# Patient Record
Sex: Female | Born: 1935
Health system: Southern US, Community
[De-identification: ages and names within clinical notes are randomized; demographics above are authoritative.]

## PROBLEM LIST (undated history)

## (undated) DIAGNOSIS — D45 Polycythemia vera: Secondary | ICD-10-CM

## (undated) DIAGNOSIS — Q51818 Other congenital malformations of uterus: Secondary | ICD-10-CM

## (undated) DIAGNOSIS — J111 Influenza due to unidentified influenza virus with other respiratory manifestations: Secondary | ICD-10-CM

## (undated) DIAGNOSIS — D751 Secondary polycythemia: Secondary | ICD-10-CM

## (undated) DIAGNOSIS — I10 Essential (primary) hypertension: Secondary | ICD-10-CM

## (undated) HISTORY — DX: Essential (primary) hypertension: I10

## (undated) HISTORY — PX: REVISION TOTAL HIP ARTHROPLASTY: SHX766

## (undated) HISTORY — DX: Other congenital malformations of uterus: Q51.818

## (undated) HISTORY — PX: APPENDECTOMY: SHX54

## (undated) HISTORY — DX: Polycythemia vera: D45

## (undated) HISTORY — DX: Influenza due to unidentified influenza virus with other respiratory manifestations: J11.1

## (undated) HISTORY — PX: OOPHORECTOMY: SHX86

## (undated) HISTORY — PX: COLONOSCOPY: SHX174

## (undated) HISTORY — DX: Secondary polycythemia: D75.1

## (undated) HISTORY — PX: OTHER SURGICAL HISTORY: SHX169

---

## 2002-04-06 ENCOUNTER — Ambulatory Visit (HOSPITAL_COMMUNITY): Admission: RE | Admit: 2002-04-06 | Discharge: 2002-04-06 | Payer: Self-pay | Admitting: Internal Medicine

## 2002-04-06 ENCOUNTER — Encounter: Payer: Self-pay | Admitting: Internal Medicine

## 2003-04-29 ENCOUNTER — Encounter: Payer: Self-pay | Admitting: Family Medicine

## 2003-04-29 ENCOUNTER — Ambulatory Visit (HOSPITAL_COMMUNITY): Admission: RE | Admit: 2003-04-29 | Discharge: 2003-04-29 | Payer: Self-pay | Admitting: Family Medicine

## 2003-10-26 ENCOUNTER — Emergency Department (HOSPITAL_COMMUNITY): Admission: EM | Admit: 2003-10-26 | Discharge: 2003-10-26 | Payer: Self-pay | Admitting: Internal Medicine

## 2004-01-20 ENCOUNTER — Ambulatory Visit (HOSPITAL_COMMUNITY): Admission: RE | Admit: 2004-01-20 | Discharge: 2004-01-20 | Payer: Self-pay | Admitting: Family Medicine

## 2004-05-04 ENCOUNTER — Ambulatory Visit (HOSPITAL_COMMUNITY): Admission: RE | Admit: 2004-05-04 | Discharge: 2004-05-04 | Payer: Self-pay | Admitting: Family Medicine

## 2004-05-15 ENCOUNTER — Ambulatory Visit (HOSPITAL_COMMUNITY): Admission: RE | Admit: 2004-05-15 | Discharge: 2004-05-15 | Payer: Self-pay | Admitting: Family Medicine

## 2004-07-17 ENCOUNTER — Encounter: Admission: RE | Admit: 2004-07-17 | Discharge: 2004-07-17 | Payer: Self-pay | Admitting: Oncology

## 2004-07-18 ENCOUNTER — Encounter (HOSPITAL_COMMUNITY): Admission: RE | Admit: 2004-07-18 | Discharge: 2004-08-17 | Payer: Self-pay | Admitting: Oncology

## 2004-08-27 ENCOUNTER — Encounter (HOSPITAL_COMMUNITY): Admission: RE | Admit: 2004-08-27 | Discharge: 2004-09-14 | Payer: Self-pay | Admitting: Oncology

## 2004-08-27 ENCOUNTER — Encounter: Admission: RE | Admit: 2004-08-27 | Discharge: 2004-09-14 | Payer: Self-pay | Admitting: Oncology

## 2004-09-20 ENCOUNTER — Encounter: Admission: RE | Admit: 2004-09-20 | Discharge: 2004-09-20 | Payer: Self-pay | Admitting: Oncology

## 2004-09-20 ENCOUNTER — Encounter (HOSPITAL_COMMUNITY): Admission: RE | Admit: 2004-09-20 | Discharge: 2004-10-20 | Payer: Self-pay | Admitting: Oncology

## 2004-11-07 ENCOUNTER — Ambulatory Visit (HOSPITAL_COMMUNITY): Payer: Self-pay | Admitting: Oncology

## 2004-11-07 ENCOUNTER — Encounter: Admission: RE | Admit: 2004-11-07 | Discharge: 2004-11-07 | Payer: Self-pay | Admitting: Oncology

## 2004-11-07 ENCOUNTER — Encounter (HOSPITAL_COMMUNITY): Admission: RE | Admit: 2004-11-07 | Discharge: 2004-12-07 | Payer: Self-pay | Admitting: Internal Medicine

## 2004-12-12 ENCOUNTER — Encounter (HOSPITAL_COMMUNITY): Admission: RE | Admit: 2004-12-12 | Discharge: 2005-01-11 | Payer: Self-pay | Admitting: Oncology

## 2004-12-12 ENCOUNTER — Encounter: Admission: RE | Admit: 2004-12-12 | Discharge: 2004-12-12 | Payer: Self-pay | Admitting: Oncology

## 2005-01-11 ENCOUNTER — Ambulatory Visit (HOSPITAL_COMMUNITY): Payer: Self-pay | Admitting: Oncology

## 2005-02-01 ENCOUNTER — Emergency Department (HOSPITAL_COMMUNITY): Admission: EM | Admit: 2005-02-01 | Discharge: 2005-02-02 | Payer: Self-pay | Admitting: *Deleted

## 2005-02-08 ENCOUNTER — Encounter (HOSPITAL_COMMUNITY): Admission: RE | Admit: 2005-02-08 | Discharge: 2005-03-10 | Payer: Self-pay | Admitting: Oncology

## 2005-02-08 ENCOUNTER — Encounter: Admission: RE | Admit: 2005-02-08 | Discharge: 2005-02-08 | Payer: Self-pay | Admitting: Oncology

## 2005-03-15 ENCOUNTER — Encounter: Admission: RE | Admit: 2005-03-15 | Discharge: 2005-03-15 | Payer: Self-pay | Admitting: Oncology

## 2005-03-15 ENCOUNTER — Ambulatory Visit (HOSPITAL_COMMUNITY): Payer: Self-pay | Admitting: Oncology

## 2005-03-15 ENCOUNTER — Encounter (HOSPITAL_COMMUNITY): Admission: RE | Admit: 2005-03-15 | Discharge: 2005-04-14 | Payer: Self-pay | Admitting: Oncology

## 2005-04-30 ENCOUNTER — Ambulatory Visit (HOSPITAL_COMMUNITY): Payer: Self-pay | Admitting: Oncology

## 2005-04-30 ENCOUNTER — Encounter (HOSPITAL_COMMUNITY): Admission: RE | Admit: 2005-04-30 | Discharge: 2005-05-30 | Payer: Self-pay | Admitting: Oncology

## 2005-04-30 ENCOUNTER — Encounter: Admission: RE | Admit: 2005-04-30 | Discharge: 2005-04-30 | Payer: Self-pay | Admitting: Oncology

## 2005-05-14 ENCOUNTER — Ambulatory Visit (HOSPITAL_COMMUNITY): Admission: RE | Admit: 2005-05-14 | Discharge: 2005-05-14 | Payer: Self-pay | Admitting: Family Medicine

## 2005-06-19 ENCOUNTER — Ambulatory Visit (HOSPITAL_COMMUNITY): Payer: Self-pay | Admitting: Oncology

## 2005-06-19 ENCOUNTER — Encounter: Admission: RE | Admit: 2005-06-19 | Discharge: 2005-06-19 | Payer: Self-pay | Admitting: Oncology

## 2005-06-19 ENCOUNTER — Encounter (HOSPITAL_COMMUNITY): Admission: RE | Admit: 2005-06-19 | Discharge: 2005-07-19 | Payer: Self-pay | Admitting: Oncology

## 2005-07-31 ENCOUNTER — Encounter (HOSPITAL_COMMUNITY): Admission: RE | Admit: 2005-07-31 | Discharge: 2005-08-30 | Payer: Self-pay | Admitting: Oncology

## 2005-07-31 ENCOUNTER — Encounter: Admission: RE | Admit: 2005-07-31 | Discharge: 2005-07-31 | Payer: Self-pay | Admitting: Oncology

## 2005-08-21 ENCOUNTER — Ambulatory Visit (HOSPITAL_COMMUNITY): Payer: Self-pay | Admitting: Oncology

## 2005-09-11 ENCOUNTER — Encounter: Admission: RE | Admit: 2005-09-11 | Discharge: 2005-09-14 | Payer: Self-pay | Admitting: Oncology

## 2005-09-11 ENCOUNTER — Encounter (HOSPITAL_COMMUNITY): Admission: RE | Admit: 2005-09-11 | Discharge: 2005-09-14 | Payer: Self-pay | Admitting: Oncology

## 2005-10-02 ENCOUNTER — Encounter: Admission: RE | Admit: 2005-10-02 | Discharge: 2005-10-02 | Payer: Self-pay | Admitting: Oncology

## 2005-10-02 ENCOUNTER — Encounter (HOSPITAL_COMMUNITY): Admission: RE | Admit: 2005-10-02 | Discharge: 2005-11-01 | Payer: Self-pay | Admitting: Oncology

## 2005-10-23 ENCOUNTER — Ambulatory Visit (HOSPITAL_COMMUNITY): Payer: Self-pay | Admitting: Oncology

## 2005-11-26 ENCOUNTER — Encounter: Admission: RE | Admit: 2005-11-26 | Discharge: 2005-11-26 | Payer: Self-pay | Admitting: Oncology

## 2005-11-26 ENCOUNTER — Encounter (HOSPITAL_COMMUNITY): Admission: RE | Admit: 2005-11-26 | Discharge: 2005-11-26 | Payer: Self-pay | Admitting: Oncology

## 2006-01-07 ENCOUNTER — Ambulatory Visit (HOSPITAL_COMMUNITY): Payer: Self-pay | Admitting: Oncology

## 2006-01-07 ENCOUNTER — Encounter (HOSPITAL_COMMUNITY): Admission: RE | Admit: 2006-01-07 | Discharge: 2006-02-06 | Payer: Self-pay | Admitting: Oncology

## 2006-01-07 ENCOUNTER — Encounter: Admission: RE | Admit: 2006-01-07 | Discharge: 2006-01-07 | Payer: Self-pay | Admitting: Oncology

## 2006-02-18 ENCOUNTER — Encounter: Admission: RE | Admit: 2006-02-18 | Discharge: 2006-02-18 | Payer: Self-pay | Admitting: Oncology

## 2006-02-18 ENCOUNTER — Encounter (HOSPITAL_COMMUNITY): Admission: RE | Admit: 2006-02-18 | Discharge: 2006-03-20 | Payer: Self-pay | Admitting: Oncology

## 2006-03-18 ENCOUNTER — Ambulatory Visit (HOSPITAL_COMMUNITY): Payer: Self-pay | Admitting: Oncology

## 2006-04-15 ENCOUNTER — Encounter: Admission: RE | Admit: 2006-04-15 | Discharge: 2006-04-15 | Payer: Self-pay | Admitting: Oncology

## 2006-04-15 ENCOUNTER — Encounter (HOSPITAL_COMMUNITY): Admission: RE | Admit: 2006-04-15 | Discharge: 2006-05-15 | Payer: Self-pay | Admitting: Oncology

## 2006-05-14 ENCOUNTER — Ambulatory Visit (HOSPITAL_COMMUNITY): Payer: Self-pay | Admitting: Oncology

## 2006-05-19 ENCOUNTER — Ambulatory Visit (HOSPITAL_COMMUNITY): Admission: RE | Admit: 2006-05-19 | Discharge: 2006-05-19 | Payer: Self-pay | Admitting: Family Medicine

## 2006-06-10 ENCOUNTER — Encounter: Admission: RE | Admit: 2006-06-10 | Discharge: 2006-06-10 | Payer: Self-pay | Admitting: Oncology

## 2006-06-10 ENCOUNTER — Encounter (HOSPITAL_COMMUNITY): Admission: RE | Admit: 2006-06-10 | Discharge: 2006-07-10 | Payer: Self-pay | Admitting: Oncology

## 2006-07-14 ENCOUNTER — Encounter (HOSPITAL_COMMUNITY): Admission: RE | Admit: 2006-07-14 | Discharge: 2006-08-13 | Payer: Self-pay | Admitting: Oncology

## 2006-07-14 ENCOUNTER — Encounter: Admission: RE | Admit: 2006-07-14 | Discharge: 2006-07-14 | Payer: Self-pay | Admitting: Oncology

## 2006-07-14 ENCOUNTER — Ambulatory Visit (HOSPITAL_COMMUNITY): Payer: Self-pay | Admitting: Oncology

## 2006-09-08 ENCOUNTER — Ambulatory Visit (HOSPITAL_COMMUNITY): Payer: Self-pay | Admitting: Oncology

## 2006-09-08 ENCOUNTER — Encounter (HOSPITAL_COMMUNITY): Admission: RE | Admit: 2006-09-08 | Discharge: 2006-09-12 | Payer: Self-pay | Admitting: Oncology

## 2006-09-08 ENCOUNTER — Encounter: Admission: RE | Admit: 2006-09-08 | Discharge: 2006-09-12 | Payer: Self-pay | Admitting: Oncology

## 2006-10-06 ENCOUNTER — Encounter: Admission: RE | Admit: 2006-10-06 | Discharge: 2006-10-06 | Payer: Self-pay | Admitting: Oncology

## 2006-10-06 ENCOUNTER — Encounter (HOSPITAL_COMMUNITY): Admission: RE | Admit: 2006-10-06 | Discharge: 2006-11-05 | Payer: Self-pay | Admitting: Oncology

## 2006-10-24 ENCOUNTER — Ambulatory Visit (HOSPITAL_COMMUNITY): Admission: RE | Admit: 2006-10-24 | Discharge: 2006-10-24 | Payer: Self-pay | Admitting: Urology

## 2006-11-03 ENCOUNTER — Ambulatory Visit (HOSPITAL_COMMUNITY): Payer: Self-pay | Admitting: Oncology

## 2006-11-25 ENCOUNTER — Encounter (HOSPITAL_COMMUNITY): Admission: RE | Admit: 2006-11-25 | Discharge: 2006-12-15 | Payer: Self-pay | Admitting: Oncology

## 2006-12-22 ENCOUNTER — Ambulatory Visit (HOSPITAL_COMMUNITY): Payer: Self-pay | Admitting: Oncology

## 2006-12-22 ENCOUNTER — Encounter (HOSPITAL_COMMUNITY): Admission: RE | Admit: 2006-12-22 | Discharge: 2007-01-21 | Payer: Self-pay | Admitting: Oncology

## 2007-02-17 ENCOUNTER — Ambulatory Visit (HOSPITAL_COMMUNITY): Payer: Self-pay | Admitting: Oncology

## 2007-02-17 ENCOUNTER — Encounter (HOSPITAL_COMMUNITY): Admission: RE | Admit: 2007-02-17 | Discharge: 2007-03-19 | Payer: Self-pay | Admitting: Oncology

## 2007-03-23 ENCOUNTER — Encounter (HOSPITAL_COMMUNITY): Admission: RE | Admit: 2007-03-23 | Discharge: 2007-04-22 | Payer: Self-pay | Admitting: Oncology

## 2007-04-20 ENCOUNTER — Ambulatory Visit (HOSPITAL_COMMUNITY): Payer: Self-pay | Admitting: Oncology

## 2007-05-22 ENCOUNTER — Ambulatory Visit (HOSPITAL_COMMUNITY): Admission: RE | Admit: 2007-05-22 | Discharge: 2007-05-22 | Payer: Self-pay | Admitting: Family Medicine

## 2007-05-26 ENCOUNTER — Encounter (HOSPITAL_COMMUNITY): Admission: RE | Admit: 2007-05-26 | Discharge: 2007-06-25 | Payer: Self-pay | Admitting: Oncology

## 2007-05-30 ENCOUNTER — Emergency Department (HOSPITAL_COMMUNITY): Admission: EM | Admit: 2007-05-30 | Discharge: 2007-05-30 | Payer: Self-pay | Admitting: Emergency Medicine

## 2007-06-23 ENCOUNTER — Ambulatory Visit (HOSPITAL_COMMUNITY): Payer: Self-pay | Admitting: Oncology

## 2007-07-20 ENCOUNTER — Encounter (HOSPITAL_COMMUNITY): Admission: RE | Admit: 2007-07-20 | Discharge: 2007-08-19 | Payer: Self-pay | Admitting: Oncology

## 2007-08-18 ENCOUNTER — Ambulatory Visit (HOSPITAL_COMMUNITY): Payer: Self-pay | Admitting: Oncology

## 2007-09-04 ENCOUNTER — Ambulatory Visit (HOSPITAL_COMMUNITY): Admission: RE | Admit: 2007-09-04 | Discharge: 2007-09-04 | Payer: Self-pay | Admitting: Family Medicine

## 2007-09-15 ENCOUNTER — Encounter (HOSPITAL_COMMUNITY): Admission: RE | Admit: 2007-09-15 | Discharge: 2007-09-15 | Payer: Self-pay | Admitting: Oncology

## 2007-10-13 ENCOUNTER — Encounter (HOSPITAL_COMMUNITY): Admission: RE | Admit: 2007-10-13 | Discharge: 2007-11-12 | Payer: Self-pay | Admitting: Oncology

## 2007-10-13 ENCOUNTER — Ambulatory Visit (HOSPITAL_COMMUNITY): Payer: Self-pay | Admitting: Oncology

## 2007-11-24 ENCOUNTER — Encounter (HOSPITAL_COMMUNITY): Admission: RE | Admit: 2007-11-24 | Discharge: 2007-12-16 | Payer: Self-pay | Admitting: Oncology

## 2007-12-15 ENCOUNTER — Ambulatory Visit (HOSPITAL_COMMUNITY): Payer: Self-pay | Admitting: Oncology

## 2008-01-12 ENCOUNTER — Encounter (HOSPITAL_COMMUNITY): Admission: RE | Admit: 2008-01-12 | Discharge: 2008-02-11 | Payer: Self-pay | Admitting: Oncology

## 2008-02-09 ENCOUNTER — Ambulatory Visit (HOSPITAL_COMMUNITY): Payer: Self-pay | Admitting: Oncology

## 2008-03-08 ENCOUNTER — Encounter (HOSPITAL_COMMUNITY): Admission: RE | Admit: 2008-03-08 | Discharge: 2008-04-07 | Payer: Self-pay | Admitting: Oncology

## 2008-04-01 ENCOUNTER — Encounter: Payer: Self-pay | Admitting: Gastroenterology

## 2008-04-01 ENCOUNTER — Ambulatory Visit (HOSPITAL_COMMUNITY): Admission: RE | Admit: 2008-04-01 | Discharge: 2008-04-01 | Payer: Self-pay | Admitting: Gastroenterology

## 2008-04-06 ENCOUNTER — Ambulatory Visit: Payer: Self-pay | Admitting: Gastroenterology

## 2008-04-12 ENCOUNTER — Encounter (HOSPITAL_COMMUNITY): Admission: RE | Admit: 2008-04-12 | Discharge: 2008-05-12 | Payer: Self-pay | Admitting: Oncology

## 2008-04-12 ENCOUNTER — Ambulatory Visit (HOSPITAL_COMMUNITY): Payer: Self-pay | Admitting: Oncology

## 2008-06-03 ENCOUNTER — Ambulatory Visit (HOSPITAL_COMMUNITY): Admission: RE | Admit: 2008-06-03 | Discharge: 2008-06-03 | Payer: Self-pay | Admitting: Family Medicine

## 2008-06-10 ENCOUNTER — Ambulatory Visit (HOSPITAL_COMMUNITY): Payer: Self-pay | Admitting: Oncology

## 2008-06-10 ENCOUNTER — Encounter (HOSPITAL_COMMUNITY): Admission: RE | Admit: 2008-06-10 | Discharge: 2008-07-10 | Payer: Self-pay | Admitting: Oncology

## 2008-07-19 ENCOUNTER — Encounter (HOSPITAL_COMMUNITY): Admission: RE | Admit: 2008-07-19 | Discharge: 2008-08-18 | Payer: Self-pay | Admitting: Oncology

## 2008-08-30 ENCOUNTER — Encounter (HOSPITAL_COMMUNITY): Admission: RE | Admit: 2008-08-30 | Discharge: 2008-09-29 | Payer: Self-pay | Admitting: Oncology

## 2008-08-30 ENCOUNTER — Ambulatory Visit (HOSPITAL_COMMUNITY): Payer: Self-pay | Admitting: Oncology

## 2008-10-11 ENCOUNTER — Encounter (HOSPITAL_COMMUNITY): Admission: RE | Admit: 2008-10-11 | Discharge: 2008-11-10 | Payer: Self-pay | Admitting: Oncology

## 2008-11-22 ENCOUNTER — Ambulatory Visit (HOSPITAL_COMMUNITY): Payer: Self-pay | Admitting: Oncology

## 2008-11-22 ENCOUNTER — Encounter (HOSPITAL_COMMUNITY): Admission: RE | Admit: 2008-11-22 | Discharge: 2008-12-14 | Payer: Self-pay | Admitting: Oncology

## 2009-01-17 ENCOUNTER — Ambulatory Visit (HOSPITAL_COMMUNITY): Payer: Self-pay | Admitting: Oncology

## 2009-01-17 ENCOUNTER — Encounter (HOSPITAL_COMMUNITY): Admission: RE | Admit: 2009-01-17 | Discharge: 2009-02-16 | Payer: Self-pay | Admitting: Oncology

## 2009-03-06 ENCOUNTER — Emergency Department (HOSPITAL_COMMUNITY): Admission: EM | Admit: 2009-03-06 | Discharge: 2009-03-06 | Payer: Self-pay | Admitting: Emergency Medicine

## 2009-03-14 ENCOUNTER — Encounter (HOSPITAL_COMMUNITY): Admission: RE | Admit: 2009-03-14 | Discharge: 2009-04-14 | Payer: Self-pay | Admitting: Oncology

## 2009-03-14 ENCOUNTER — Ambulatory Visit (HOSPITAL_COMMUNITY): Payer: Self-pay | Admitting: Oncology

## 2009-05-09 ENCOUNTER — Encounter (HOSPITAL_COMMUNITY): Admission: RE | Admit: 2009-05-09 | Discharge: 2009-06-08 | Payer: Self-pay | Admitting: Oncology

## 2009-05-09 ENCOUNTER — Ambulatory Visit (HOSPITAL_COMMUNITY): Payer: Self-pay | Admitting: Oncology

## 2009-06-23 ENCOUNTER — Ambulatory Visit (HOSPITAL_COMMUNITY): Admission: RE | Admit: 2009-06-23 | Discharge: 2009-06-23 | Payer: Self-pay | Admitting: Family Medicine

## 2009-06-30 ENCOUNTER — Ambulatory Visit (HOSPITAL_COMMUNITY): Payer: Self-pay | Admitting: Oncology

## 2009-06-30 ENCOUNTER — Encounter (HOSPITAL_COMMUNITY): Admission: RE | Admit: 2009-06-30 | Discharge: 2009-07-30 | Payer: Self-pay | Admitting: Oncology

## 2009-08-18 ENCOUNTER — Ambulatory Visit (HOSPITAL_COMMUNITY): Payer: Self-pay | Admitting: Oncology

## 2009-08-18 ENCOUNTER — Encounter (HOSPITAL_COMMUNITY): Admission: RE | Admit: 2009-08-18 | Discharge: 2009-09-13 | Payer: Self-pay | Admitting: Oncology

## 2009-10-10 ENCOUNTER — Encounter (HOSPITAL_COMMUNITY): Admission: RE | Admit: 2009-10-10 | Discharge: 2009-11-09 | Payer: Self-pay | Admitting: Oncology

## 2009-10-10 ENCOUNTER — Ambulatory Visit (HOSPITAL_COMMUNITY): Payer: Self-pay | Admitting: Oncology

## 2009-12-05 ENCOUNTER — Encounter (HOSPITAL_COMMUNITY): Admission: RE | Admit: 2009-12-05 | Discharge: 2009-12-13 | Payer: Self-pay | Admitting: Oncology

## 2009-12-05 ENCOUNTER — Ambulatory Visit (HOSPITAL_COMMUNITY): Payer: Self-pay | Admitting: Oncology

## 2010-01-30 ENCOUNTER — Encounter (HOSPITAL_COMMUNITY): Admission: RE | Admit: 2010-01-30 | Discharge: 2010-03-01 | Payer: Self-pay | Admitting: Oncology

## 2010-01-30 ENCOUNTER — Ambulatory Visit (HOSPITAL_COMMUNITY): Payer: Self-pay | Admitting: Oncology

## 2010-03-02 ENCOUNTER — Other Ambulatory Visit: Admission: RE | Admit: 2010-03-02 | Discharge: 2010-03-02 | Payer: Self-pay | Admitting: Obstetrics & Gynecology

## 2010-03-27 ENCOUNTER — Ambulatory Visit (HOSPITAL_COMMUNITY): Payer: Self-pay | Admitting: Oncology

## 2010-03-27 ENCOUNTER — Encounter (HOSPITAL_COMMUNITY): Admission: RE | Admit: 2010-03-27 | Discharge: 2010-04-26 | Payer: Self-pay | Admitting: Oncology

## 2010-04-13 ENCOUNTER — Ambulatory Visit (HOSPITAL_COMMUNITY): Admission: RE | Admit: 2010-04-13 | Discharge: 2010-04-13 | Payer: Self-pay | Admitting: Family Medicine

## 2010-05-29 ENCOUNTER — Encounter (HOSPITAL_COMMUNITY): Admission: RE | Admit: 2010-05-29 | Discharge: 2010-06-28 | Payer: Self-pay | Admitting: Oncology

## 2010-05-29 ENCOUNTER — Ambulatory Visit (HOSPITAL_COMMUNITY): Payer: Self-pay | Admitting: Oncology

## 2010-07-05 ENCOUNTER — Ambulatory Visit (HOSPITAL_COMMUNITY): Admission: RE | Admit: 2010-07-05 | Discharge: 2010-07-05 | Payer: Self-pay | Admitting: Family Medicine

## 2010-07-25 ENCOUNTER — Ambulatory Visit (HOSPITAL_COMMUNITY): Admission: RE | Admit: 2010-07-25 | Discharge: 2010-07-25 | Payer: Self-pay | Admitting: Family Medicine

## 2010-08-01 ENCOUNTER — Ambulatory Visit (HOSPITAL_COMMUNITY): Payer: Self-pay | Admitting: Oncology

## 2010-08-01 ENCOUNTER — Encounter (HOSPITAL_COMMUNITY): Admission: RE | Admit: 2010-08-01 | Discharge: 2010-08-31 | Payer: Self-pay | Admitting: Oncology

## 2010-09-25 ENCOUNTER — Ambulatory Visit (HOSPITAL_COMMUNITY): Payer: Self-pay | Admitting: Oncology

## 2010-09-25 ENCOUNTER — Encounter (HOSPITAL_COMMUNITY)
Admission: RE | Admit: 2010-09-25 | Discharge: 2010-10-25 | Payer: Self-pay | Source: Home / Self Care | Admitting: Oncology

## 2010-11-20 ENCOUNTER — Encounter (HOSPITAL_COMMUNITY)
Admission: RE | Admit: 2010-11-20 | Discharge: 2010-12-20 | Payer: Self-pay | Source: Home / Self Care | Attending: Oncology | Admitting: Oncology

## 2010-11-20 ENCOUNTER — Ambulatory Visit (HOSPITAL_COMMUNITY): Payer: Self-pay | Admitting: Oncology

## 2011-01-04 ENCOUNTER — Encounter (HOSPITAL_COMMUNITY)
Admission: RE | Admit: 2011-01-04 | Discharge: 2011-01-15 | Payer: Self-pay | Source: Home / Self Care | Attending: Oncology | Admitting: Oncology

## 2011-01-04 ENCOUNTER — Ambulatory Visit (HOSPITAL_COMMUNITY)
Admission: RE | Admit: 2011-01-04 | Discharge: 2011-01-15 | Payer: Self-pay | Source: Home / Self Care | Attending: Oncology | Admitting: Oncology

## 2011-01-06 ENCOUNTER — Encounter: Payer: Self-pay | Admitting: Family Medicine

## 2011-01-07 LAB — CBC
MCH: 31 pg (ref 26.0–34.0)
MCV: 92.5 fL (ref 78.0–100.0)
Platelets: 436 10*3/uL — ABNORMAL HIGH (ref 150–400)
RDW: 15.2 % (ref 11.5–15.5)
WBC: 7.6 10*3/uL (ref 4.0–10.5)

## 2011-01-07 LAB — DIFFERENTIAL
Eosinophils Absolute: 0.3 10*3/uL (ref 0.0–0.7)
Eosinophils Relative: 4 % (ref 0–5)
Lymphs Abs: 1.3 10*3/uL (ref 0.7–4.0)

## 2011-01-28 ENCOUNTER — Ambulatory Visit (HOSPITAL_COMMUNITY)
Admission: RE | Admit: 2011-01-28 | Discharge: 2011-01-28 | Disposition: A | Discharge status: Home / Self Care | Payer: Medicare Other | Source: Ambulatory Visit | Attending: Family Medicine | Admitting: Family Medicine

## 2011-01-28 ENCOUNTER — Other Ambulatory Visit (HOSPITAL_COMMUNITY): Payer: Self-pay | Admitting: Family Medicine

## 2011-01-28 DIAGNOSIS — M545 Low back pain, unspecified: Secondary | ICD-10-CM | POA: Insufficient documentation

## 2011-01-28 DIAGNOSIS — M51379 Other intervertebral disc degeneration, lumbosacral region without mention of lumbar back pain or lower extremity pain: Secondary | ICD-10-CM | POA: Insufficient documentation

## 2011-01-28 DIAGNOSIS — M5137 Other intervertebral disc degeneration, lumbosacral region: Secondary | ICD-10-CM | POA: Insufficient documentation

## 2011-02-25 LAB — DIFFERENTIAL
Basophils Absolute: 0.1 10*3/uL (ref 0.0–0.1)
Basophils Relative: 1 % (ref 0–1)
Neutro Abs: 5.1 10*3/uL (ref 1.7–7.7)
Neutrophils Relative %: 64 % (ref 43–77)

## 2011-02-25 LAB — CBC
Hemoglobin: 14 g/dL (ref 12.0–15.0)
MCHC: 33.4 g/dL (ref 30.0–36.0)
Platelets: 467 10*3/uL — ABNORMAL HIGH (ref 150–400)
RDW: 15.4 % (ref 11.5–15.5)

## 2011-02-27 LAB — CBC
Hemoglobin: 13.3 g/dL (ref 12.0–15.0)
RBC: 4.26 MIL/uL (ref 3.87–5.11)
WBC: 6.8 10*3/uL (ref 4.0–10.5)

## 2011-02-28 LAB — DIFFERENTIAL
Eosinophils Relative: 4 % (ref 0–5)
Lymphocytes Relative: 21 % (ref 12–46)
Lymphs Abs: 1.4 10*3/uL (ref 0.7–4.0)
Monocytes Absolute: 0.5 10*3/uL (ref 0.1–1.0)
Neutro Abs: 4.4 10*3/uL (ref 1.7–7.7)

## 2011-02-28 LAB — CBC
HCT: 43.1 % (ref 36.0–46.0)
MCV: 95.1 fL (ref 78.0–100.0)
Platelets: 461 10*3/uL — ABNORMAL HIGH (ref 150–400)
RBC: 4.53 MIL/uL (ref 3.87–5.11)
WBC: 6.7 10*3/uL (ref 4.0–10.5)

## 2011-03-04 LAB — DIFFERENTIAL
Eosinophils Absolute: 0.3 10*3/uL (ref 0.0–0.7)
Eosinophils Relative: 4 % (ref 0–5)
Lymphs Abs: 1.7 10*3/uL (ref 0.7–4.0)
Monocytes Absolute: 0.6 10*3/uL (ref 0.1–1.0)

## 2011-03-04 LAB — CBC
HCT: 39.8 % (ref 36.0–46.0)
Hemoglobin: 13.4 g/dL (ref 12.0–15.0)
MCV: 92.9 fL (ref 78.0–100.0)
Platelets: 394 10*3/uL (ref 150–400)
WBC: 7.4 10*3/uL (ref 4.0–10.5)

## 2011-03-05 ENCOUNTER — Other Ambulatory Visit (HOSPITAL_COMMUNITY): Payer: Self-pay

## 2011-03-05 ENCOUNTER — Encounter (HOSPITAL_COMMUNITY): Payer: Medicare Other | Attending: Oncology

## 2011-03-05 ENCOUNTER — Other Ambulatory Visit (HOSPITAL_COMMUNITY): Payer: Medicare Other

## 2011-03-05 DIAGNOSIS — D45 Polycythemia vera: Secondary | ICD-10-CM

## 2011-03-06 LAB — DIFFERENTIAL
Basophils Absolute: 0.1 10*3/uL (ref 0.0–0.1)
Basophils Relative: 1 % (ref 0–1)
Eosinophils Absolute: 0.2 10*3/uL (ref 0.0–0.7)
Eosinophils Absolute: 0.3 10*3/uL (ref 0.0–0.7)
Eosinophils Relative: 4 % (ref 0–5)
Lymphocytes Relative: 26 % (ref 12–46)
Lymphs Abs: 1.7 10*3/uL (ref 0.7–4.0)
Lymphs Abs: 1.8 10*3/uL (ref 0.7–4.0)
Monocytes Relative: 7 % (ref 3–12)
Neutro Abs: 4.3 10*3/uL (ref 1.7–7.7)
Neutrophils Relative %: 60 % (ref 43–77)
Neutrophils Relative %: 62 % (ref 43–77)

## 2011-03-06 LAB — CBC
HCT: 38.8 % (ref 36.0–46.0)
MCHC: 34.5 g/dL (ref 30.0–36.0)
MCV: 92 fL (ref 78.0–100.0)
Platelets: 390 10*3/uL (ref 150–400)
Platelets: 392 10*3/uL (ref 150–400)
RBC: 4.19 MIL/uL (ref 3.87–5.11)
RDW: 15.4 % (ref 11.5–15.5)
WBC: 6.7 10*3/uL (ref 4.0–10.5)
WBC: 7 10*3/uL (ref 4.0–10.5)

## 2011-03-18 LAB — CBC
HCT: 41 % (ref 36.0–46.0)
Hemoglobin: 13.5 g/dL (ref 12.0–15.0)
MCHC: 33 g/dL (ref 30.0–36.0)
MCHC: 33 g/dL (ref 30.0–36.0)
MCV: 95.9 fL (ref 78.0–100.0)
MCV: 96.5 fL (ref 78.0–100.0)
Platelets: 380 10*3/uL (ref 150–400)
RBC: 4.25 MIL/uL (ref 3.87–5.11)
WBC: 7 10*3/uL (ref 4.0–10.5)
WBC: 7.2 10*3/uL (ref 4.0–10.5)

## 2011-03-18 LAB — DIFFERENTIAL
Basophils Relative: 1 % (ref 0–1)
Eosinophils Absolute: 0.2 10*3/uL (ref 0.0–0.7)
Eosinophils Relative: 4 % (ref 0–5)
Lymphs Abs: 1.7 10*3/uL (ref 0.7–4.0)
Monocytes Relative: 4 % (ref 3–12)
Neutrophils Relative %: 68 % (ref 43–77)

## 2011-03-21 LAB — DIFFERENTIAL
Basophils Absolute: 0.1 10*3/uL (ref 0.0–0.1)
Basophils Relative: 2 % — ABNORMAL HIGH (ref 0–1)
Eosinophils Absolute: 0.2 10*3/uL (ref 0.0–0.7)
Neutro Abs: 4.2 10*3/uL (ref 1.7–7.7)
Neutrophils Relative %: 64 % (ref 43–77)

## 2011-03-21 LAB — CBC
MCHC: 34.4 g/dL (ref 30.0–36.0)
MCV: 95.9 fL (ref 78.0–100.0)
Platelets: 379 10*3/uL (ref 150–400)

## 2011-03-22 LAB — DIFFERENTIAL
Basophils Relative: 2 % — ABNORMAL HIGH (ref 0–1)
Lymphocytes Relative: 21 % (ref 12–46)
Lymphs Abs: 1.2 10*3/uL (ref 0.7–4.0)
Monocytes Relative: 7 % (ref 3–12)
Neutro Abs: 4 10*3/uL (ref 1.7–7.7)
Neutrophils Relative %: 67 % (ref 43–77)

## 2011-03-22 LAB — CBC
Hemoglobin: 14.1 g/dL (ref 12.0–15.0)
RBC: 4.29 MIL/uL (ref 3.87–5.11)
WBC: 5.9 10*3/uL (ref 4.0–10.5)

## 2011-03-24 LAB — CBC
HCT: 39.5 % (ref 36.0–46.0)
Hemoglobin: 13.7 g/dL (ref 12.0–15.0)
MCHC: 34.7 g/dL (ref 30.0–36.0)
MCV: 95.4 fL (ref 78.0–100.0)
RBC: 4.14 MIL/uL (ref 3.87–5.11)

## 2011-03-24 LAB — DIFFERENTIAL
Basophils Relative: 1 % (ref 0–1)
Eosinophils Relative: 4 % (ref 0–5)
Monocytes Absolute: 0.3 10*3/uL (ref 0.1–1.0)
Monocytes Relative: 6 % (ref 3–12)
Neutro Abs: 3.8 10*3/uL (ref 1.7–7.7)

## 2011-03-26 LAB — CBC
Hemoglobin: 13.4 g/dL (ref 12.0–15.0)
MCHC: 35.5 g/dL (ref 30.0–36.0)
Platelets: 393 10*3/uL (ref 150–400)
RDW: 16.1 % — ABNORMAL HIGH (ref 11.5–15.5)

## 2011-03-28 LAB — CBC
HCT: 41.1 % (ref 36.0–46.0)
MCHC: 34 g/dL (ref 30.0–36.0)
MCV: 94.7 fL (ref 78.0–100.0)
Platelets: 410 10*3/uL — ABNORMAL HIGH (ref 150–400)
WBC: 9.1 10*3/uL (ref 4.0–10.5)

## 2011-03-28 LAB — URINALYSIS, ROUTINE W REFLEX MICROSCOPIC
Bilirubin Urine: NEGATIVE
Ketones, ur: NEGATIVE mg/dL
Leukocytes, UA: NEGATIVE
Nitrite: NEGATIVE
Protein, ur: NEGATIVE mg/dL
Urobilinogen, UA: 0.2 mg/dL (ref 0.0–1.0)
pH: 5.5 (ref 5.0–8.0)

## 2011-03-28 LAB — DIFFERENTIAL
Basophils Relative: 1 % (ref 0–1)
Eosinophils Absolute: 0.3 10*3/uL (ref 0.0–0.7)
Eosinophils Relative: 3 % (ref 0–5)
Lymphs Abs: 1.6 10*3/uL (ref 0.7–4.0)
Monocytes Relative: 6 % (ref 3–12)

## 2011-04-02 ENCOUNTER — Encounter (HOSPITAL_COMMUNITY): Payer: Medicare Other | Attending: Oncology

## 2011-04-02 DIAGNOSIS — D45 Polycythemia vera: Secondary | ICD-10-CM | POA: Insufficient documentation

## 2011-04-02 LAB — CBC
MCHC: 34.3 g/dL (ref 30.0–36.0)
MCV: 93.9 fL (ref 78.0–100.0)
Platelets: 361 10*3/uL (ref 150–400)
WBC: 7.8 10*3/uL (ref 4.0–10.5)

## 2011-04-02 LAB — DIFFERENTIAL
Basophils Relative: 2 % — ABNORMAL HIGH (ref 0–1)
Eosinophils Absolute: 0.2 10*3/uL (ref 0.0–0.7)
Neutro Abs: 4.8 10*3/uL (ref 1.7–7.7)
Neutrophils Relative %: 61 % (ref 43–77)

## 2011-04-30 NOTE — Op Note (Signed)
[name], [name]                ACCOUNT NO.:  [id number]   MEDICAL RECORD NO.:  [id number]          PATIENT TYPE:  AMB   LOCATION:  DAY                           FACILITY:  [facility]   PHYSICIAN:  Kassie Mends, M.D.      DATE OF BIRTH:  October 08, 1936   DATE OF PROCEDURE:  04/01/2008  DATE OF DISCHARGE:                               OPERATIVE REPORT   REFERRING PHYSICIAN:  Ladona Horns. Neijstrom, MD   PROCEDURE:  Colonoscopy with cold forceps polypectomy.   INDICATION FOR EXAM:  [name] is a 75 year old female who presents for  average risk colon cancer screening.   FINDINGS:  1. A 3-mm sigmoid colon polyp removed via cold forceps.  2. Frequent sigmoid diverticula.  Otherwise no masses, inflammatory      changes, or AVMs seen.  3. Moderate internal hemorrhoids.  Otherwise normal retroflexed view      of the rectum.   RECOMMENDATIONS:  1. Screening colonoscopy in 10 years if she has no evidence of an      advanced adenoma.  2. She should follow high-fiber diet.  She was given a handout on high-      fiber diet, polyps, diverticulosis, and hemorrhoids.  3. No aspirin, NSAIDs, or anticoagulation for 3 days.   MEDICATIONS:  1. Demerol 50 mg IV.  2. Versed 5 mg IV.   PROCEDURE TECHNIQUE:  Physical exam was performed and informed consent  was obtained from the patient after explaining the benefits, risks, and  alternatives to the procedure.  The patient connected to monitor and  placed in left lateral position.  Continuous oxygen was provided by  nasal cannula and IV medicine administered through an indwelling  cannula.  After administration of sedation and rectal exam, the  patient's rectum was intubated and scope was advanced under direct  visualization to the cecum.  The scope was removed slowly by careful  examining the  color, texture, anatomy, and integrity of the mucosa on the way out.  The patient was recovered in endoscopy and discharged home in  satisfactory condition.   PATH:  Simple adenoma      Kassie Mends, M.D.  Electronically Signed     SM/MEDQ  D:  04/01/2008  T:  04/01/2008  Job:  [id number]   cc:   Ladona Horns. Mariel Sleet, MD  Fax: [phone]

## 2011-05-03 NOTE — Consult Note (Signed)
[name], [name]                ACCOUNT NO.:  [id number]   MEDICAL RECORD NO.:  [id number]          PATIENT TYPE:  EMS   LOCATION:  ED                            FACILITY:  [facility]   PHYSICIAN:  J. Darreld Mclean, M.D. DATE OF BIRTH:  [birth year]   DATE OF CONSULTATION:  DATE OF DISCHARGE:                                   CONSULTATION   HISTORY:  The patient is a 75 year old female who fell while hanging  curtains.  She injured her left distal radius.  She has got obvious  deformity and pain.  X-rays showed a comminuted fracture of the left distal  radius.  No other apparent injuries.  No head injury.  Please refer to the  ER record incorporated by reference.   A plain Xylocaine block was given, after appropriate prep and drape. Closed  reduction carried out.  The patient was placed in a sugar-tong splint,  awaiting x-rays now.  Prescription given for Darvocet 100 for pain.  I will  see her in the office on Monday.  Any difficulty or problem, come back to  the emergency room.  Precautions given in detail.      JWK/MEDQ  D:  02/01/2005  T:  02/01/2005  Job:  [id number]

## 2011-05-07 ENCOUNTER — Other Ambulatory Visit (HOSPITAL_COMMUNITY): Payer: Self-pay | Admitting: Oncology

## 2011-05-07 ENCOUNTER — Encounter (HOSPITAL_COMMUNITY): Payer: Medicare Other | Attending: Oncology

## 2011-05-07 DIAGNOSIS — D45 Polycythemia vera: Secondary | ICD-10-CM

## 2011-05-07 LAB — CBC
Hemoglobin: 12.8 g/dL (ref 12.0–15.0)
Platelets: 423 10*3/uL — ABNORMAL HIGH (ref 150–400)
RBC: 4.16 MIL/uL (ref 3.87–5.11)
WBC: 6 10*3/uL (ref 4.0–10.5)

## 2011-05-10 ENCOUNTER — Other Ambulatory Visit (HOSPITAL_COMMUNITY): Payer: Self-pay

## 2011-06-04 ENCOUNTER — Encounter (HOSPITAL_COMMUNITY): Payer: Medicare Other | Attending: Oncology

## 2011-06-04 ENCOUNTER — Other Ambulatory Visit (HOSPITAL_COMMUNITY): Payer: Self-pay | Admitting: Oncology

## 2011-06-04 DIAGNOSIS — D45 Polycythemia vera: Secondary | ICD-10-CM

## 2011-06-04 LAB — CBC
HCT: 37.9 % (ref 36.0–46.0)
Hemoglobin: 12.8 g/dL (ref 12.0–15.0)
RBC: 3.99 MIL/uL (ref 3.87–5.11)
WBC: 6.2 10*3/uL (ref 4.0–10.5)

## 2011-06-28 ENCOUNTER — Other Ambulatory Visit (HOSPITAL_COMMUNITY): Payer: Self-pay | Admitting: Family Medicine

## 2011-06-28 DIAGNOSIS — Z139 Encounter for screening, unspecified: Secondary | ICD-10-CM

## 2011-07-05 ENCOUNTER — Other Ambulatory Visit (HOSPITAL_COMMUNITY): Payer: Medicare Other

## 2011-07-05 ENCOUNTER — Encounter (HOSPITAL_COMMUNITY): Payer: Self-pay | Admitting: Oncology

## 2011-07-05 ENCOUNTER — Encounter (HOSPITAL_COMMUNITY): Payer: Medicare Other | Attending: Oncology | Admitting: Oncology

## 2011-07-05 VITALS — BP 157/79 | HR 71 | Temp 98.7°F | Wt 126.6 lb

## 2011-07-05 DIAGNOSIS — D45 Polycythemia vera: Secondary | ICD-10-CM | POA: Insufficient documentation

## 2011-07-05 LAB — CBC
HCT: 39.3 % (ref 36.0–46.0)
Hemoglobin: 13 g/dL (ref 12.0–15.0)
MCH: 32.6 pg (ref 26.0–34.0)
MCV: 98.5 fL (ref 78.0–100.0)
RBC: 3.99 MIL/uL (ref 3.87–5.11)

## 2011-07-05 LAB — DIFFERENTIAL
Eosinophils Absolute: 0.2 10*3/uL (ref 0.0–0.7)
Eosinophils Relative: 4 % (ref 0–5)
Lymphs Abs: 1.1 10*3/uL (ref 0.7–4.0)
Monocytes Absolute: 0.4 10*3/uL (ref 0.1–1.0)
Monocytes Relative: 7 % (ref 3–12)
Neutrophils Relative %: 66 % (ref 43–77)

## 2011-07-05 NOTE — Patient Instructions (Signed)
[facility]  Discharge Instructions  RECOMMENDATIONS MADE BY THE CONSULTANT AND ANY TEST RESULTS WILL BE SENT TO YOUR REFERRING DOCTOR.   EXAM FINDINGS BY MD TODAY AND SIGNS AND SYMPTOMS TO REPORT TO CLINIC OR PRIMARY MD: Continue Hydrea Mondays, Wednesdays and Fridays.  You are doing well.      SPECIAL INSTRUCTIONS/FOLLOW-UP: Lab work Needed today and monthly and Return to Clinic in 6 months to see MD   I acknowledge that I have been informed and understand all the instructions given to me and received a copy. I do not have any more questions at this time, but understand that I may call the Specialty Clinic at [facility] at [phone] during business hours should I have any further questions or need assistance in obtaining follow-up care.    __________________________________________  _____________  __________ Signature of Patient or Authorized Representative            Date                   Time    __________________________________________ Nurse's Signature

## 2011-07-05 NOTE — Progress Notes (Signed)
This office note has been dictated.

## 2011-07-05 NOTE — Progress Notes (Signed)
CC:   Kirk Ruths, M.D.  DIAGNOSIS:  Polycythemia vera diagnosed August 2005 on Hydrea now one 500 mg tablet Mondays, Wednesdays and Fridays with good control.  HISTORY:  Rayssa is doing very well from this standpoint.  She has stability of her vital signs.  She has no complaints on review of systems.  Her functional status is 100%, still working full-time essentially at [employer] place here in town.  She has no complaints on review of systems other than mentioned.  I did not examine her further.  Her labs are pending from today.  They will be done monthly.  We did have a change of course with an increase in her Hydrea a couple of months ago, so that is why we have changed from every 6 weeks to every 4 weeks.  We will see her in 6 months, sooner if need be.    ______________________________ Ladona Horns. Mariel Sleet, MD ESN/MEDQ  D:  07/05/2011  T:  07/05/2011  Job:  [id number]

## 2011-07-08 ENCOUNTER — Telehealth (HOSPITAL_COMMUNITY): Payer: Self-pay | Admitting: *Deleted

## 2011-07-08 NOTE — Telephone Encounter (Signed)
Patient notified to continue same medication dosage. She verbalized understanding.

## 2011-08-02 ENCOUNTER — Ambulatory Visit (HOSPITAL_COMMUNITY)
Admission: RE | Admit: 2011-08-02 | Discharge: 2011-08-02 | Disposition: A | Discharge status: Home / Self Care | Payer: Medicare Other | Source: Ambulatory Visit | Attending: Family Medicine | Admitting: Family Medicine

## 2011-08-02 ENCOUNTER — Telehealth (HOSPITAL_COMMUNITY): Payer: Self-pay | Admitting: *Deleted

## 2011-08-02 ENCOUNTER — Encounter (HOSPITAL_COMMUNITY): Payer: Medicare Other | Attending: Oncology

## 2011-08-02 DIAGNOSIS — Z1231 Encounter for screening mammogram for malignant neoplasm of breast: Secondary | ICD-10-CM | POA: Insufficient documentation

## 2011-08-02 DIAGNOSIS — D45 Polycythemia vera: Secondary | ICD-10-CM | POA: Insufficient documentation

## 2011-08-02 DIAGNOSIS — Z139 Encounter for screening, unspecified: Secondary | ICD-10-CM

## 2011-08-02 LAB — DIFFERENTIAL
Eosinophils Absolute: 0.3 10*3/uL (ref 0.0–0.7)
Eosinophils Relative: 4 % (ref 0–5)
Lymphs Abs: 1.3 10*3/uL (ref 0.7–4.0)
Monocytes Relative: 6 % (ref 3–12)

## 2011-08-02 LAB — CBC
MCH: 33 pg (ref 26.0–34.0)
MCV: 99.2 fL (ref 78.0–100.0)
Platelets: 389 10*3/uL (ref 150–400)
RBC: 3.91 MIL/uL (ref 3.87–5.11)

## 2011-08-02 NOTE — Progress Notes (Signed)
Labs drawn today for cbc,diff 

## 2011-08-02 NOTE — Telephone Encounter (Signed)
Pt. Notified to continue same meds, labs perfect

## 2011-08-30 ENCOUNTER — Encounter (HOSPITAL_COMMUNITY): Payer: Medicare Other | Attending: Oncology

## 2011-08-30 DIAGNOSIS — D45 Polycythemia vera: Secondary | ICD-10-CM

## 2011-08-30 LAB — CBC
MCH: 32.6 pg (ref 26.0–34.0)
MCV: 99.5 fL (ref 78.0–100.0)
Platelets: 418 10*3/uL — ABNORMAL HIGH (ref 150–400)
RDW: 14.4 % (ref 11.5–15.5)
WBC: 6.7 10*3/uL (ref 4.0–10.5)

## 2011-08-30 LAB — DIFFERENTIAL
Basophils Absolute: 0.1 10*3/uL (ref 0.0–0.1)
Basophils Relative: 1 % (ref 0–1)
Eosinophils Absolute: 0.2 10*3/uL (ref 0.0–0.7)
Eosinophils Relative: 4 % (ref 0–5)

## 2011-08-30 NOTE — Progress Notes (Signed)
Labs drawn today for cbc,diff 

## 2011-09-05 LAB — CBC
Platelets: 319
WBC: 7.1

## 2011-09-06 LAB — CBC
HCT: 36.5
Hemoglobin: 12.6
WBC: 5.7

## 2011-09-09 LAB — CBC
HCT: 36.4
Hemoglobin: 12.8
MCHC: 35.1
MCV: 93.8
Platelets: 313
RBC: 3.88
RDW: 14.6
WBC: 6.7

## 2011-09-10 LAB — CBC
Hemoglobin: 12.6
MCHC: 34.3
MCV: 93.9
RDW: 14.5

## 2011-09-11 LAB — CBC
MCHC: 35
Platelets: 362
RBC: 3.93
WBC: 6.8

## 2011-09-12 LAB — CBC
HCT: 39.5
MCHC: 34.4
MCV: 93.2
RBC: 4.23
WBC: 8.6

## 2011-09-12 LAB — DIFFERENTIAL
Basophils Relative: 1
Eosinophils Absolute: 0.2
Eosinophils Relative: 2
Lymphs Abs: 1.4
Monocytes Absolute: 0.5
Monocytes Relative: 6
Neutrophils Relative %: 74

## 2011-09-13 LAB — DIFFERENTIAL
Basophils Absolute: 0.1
Basophils Relative: 1
Eosinophils Absolute: 0.2
Monocytes Relative: 7
Neutro Abs: 4.6
Neutrophils Relative %: 62

## 2011-09-13 LAB — CBC
MCHC: 33.8
MCV: 94
Platelets: 378
RDW: 15

## 2011-09-16 LAB — CBC
Hemoglobin: 12.4
MCHC: 33.9
MCV: 93.7
Platelets: 386
RBC: 3.85 — ABNORMAL LOW
RDW: 15
WBC: 5.9
WBC: 6.9

## 2011-09-16 LAB — DIFFERENTIAL
Basophils Absolute: 0.1
Basophils Relative: 1
Eosinophils Absolute: 0.2
Eosinophils Absolute: 0.2
Eosinophils Relative: 3
Lymphocytes Relative: 25
Lymphs Abs: 1.7
Monocytes Absolute: 0.4
Neutro Abs: 3.7
Neutrophils Relative %: 63
Neutrophils Relative %: 64

## 2011-09-20 LAB — DIFFERENTIAL
Lymphocytes Relative: 27 % (ref 12–46)
Lymphs Abs: 2.1 10*3/uL (ref 0.7–4.0)
Monocytes Relative: 8 % (ref 3–12)
Neutro Abs: 4.7 10*3/uL (ref 1.7–7.7)
Neutrophils Relative %: 61 % (ref 43–77)

## 2011-09-20 LAB — CBC
HCT: 37.5
Hemoglobin: 12.5
MCHC: 33.4
MCV: 94.2 fL (ref 78.0–100.0)
MCV: 93.8
Platelets: 402 10*3/uL — ABNORMAL HIGH (ref 150–400)
RBC: 4.1 MIL/uL (ref 3.87–5.11)
RBC: 3.99
WBC: 7.7 10*3/uL (ref 4.0–10.5)
WBC: 10.2

## 2011-09-24 LAB — CBC
HCT: 39.3
MCHC: 33.1
MCV: 94
Platelets: 363

## 2011-09-25 LAB — CBC
Platelets: 412 — ABNORMAL HIGH
RDW: 14.4 — ABNORMAL HIGH
WBC: 6.7

## 2011-09-26 LAB — CBC
HCT: 36.8
Hemoglobin: 12.4
Platelets: 327
RDW: 15.1 — ABNORMAL HIGH
WBC: 7.1

## 2011-09-27 ENCOUNTER — Encounter (HOSPITAL_COMMUNITY): Payer: Medicare Other | Attending: Oncology

## 2011-09-27 DIAGNOSIS — D45 Polycythemia vera: Secondary | ICD-10-CM | POA: Insufficient documentation

## 2011-09-27 LAB — CBC
HCT: 42.1 % (ref 36.0–46.0)
HCT: 36.3
Hemoglobin: 13.7 g/dL (ref 12.0–15.0)
Hemoglobin: 12.4
MCH: 32.5 pg (ref 26.0–34.0)
MCHC: 32.5 g/dL (ref 30.0–36.0)
MCHC: 34.2
MCV: 91.9
RBC: 4.22 MIL/uL (ref 3.87–5.11)
RBC: 3.95
RDW: 14.6 — ABNORMAL HIGH

## 2011-09-27 LAB — DIFFERENTIAL
Basophils Relative: 2 % — ABNORMAL HIGH (ref 0–1)
Lymphs Abs: 1.6 10*3/uL (ref 0.7–4.0)
Monocytes Absolute: 0.5 10*3/uL (ref 0.1–1.0)
Monocytes Relative: 7 % (ref 3–12)
Neutro Abs: 5.3 10*3/uL (ref 1.7–7.7)
Neutrophils Relative %: 68 % (ref 43–77)

## 2011-09-27 NOTE — Progress Notes (Signed)
Labs drawn today for cbc/diff 

## 2011-09-30 LAB — CBC
MCHC: 34
MCV: 91.9
Platelets: 331
RDW: 15.3 — ABNORMAL HIGH

## 2011-10-01 LAB — CBC
MCV: 86.6
Platelets: 217
RBC: 4.34
WBC: 5.4

## 2011-10-03 LAB — DIFFERENTIAL
Eosinophils Relative: 3
Lymphocytes Relative: 20
Lymphs Abs: 1.1
Monocytes Relative: 6

## 2011-10-03 LAB — CBC
HCT: 36.7
Hemoglobin: 12.7
MCV: 91.9
Platelets: 324
RBC: 3.99
WBC: 5.5

## 2011-10-25 ENCOUNTER — Telehealth (HOSPITAL_COMMUNITY): Payer: Self-pay | Admitting: *Deleted

## 2011-10-25 ENCOUNTER — Encounter (HOSPITAL_COMMUNITY): Payer: Medicare Other | Attending: Oncology

## 2011-10-25 DIAGNOSIS — D45 Polycythemia vera: Secondary | ICD-10-CM | POA: Insufficient documentation

## 2011-10-25 LAB — CBC
HCT: 39.4 % (ref 36.0–46.0)
MCH: 32.1 pg (ref 26.0–34.0)
MCV: 99.5 fL (ref 78.0–100.0)
RDW: 14.7 % (ref 11.5–15.5)
WBC: 7.1 10*3/uL (ref 4.0–10.5)

## 2011-10-25 LAB — DIFFERENTIAL
Basophils Absolute: 0.1 10*3/uL (ref 0.0–0.1)
Eosinophils Absolute: 0.2 10*3/uL (ref 0.0–0.7)
Eosinophils Relative: 3 % (ref 0–5)
Lymphocytes Relative: 18 % (ref 12–46)
Lymphs Abs: 1.3 10*3/uL (ref 0.7–4.0)
Monocytes Absolute: 0.5 10*3/uL (ref 0.1–1.0)

## 2011-10-25 NOTE — Telephone Encounter (Signed)
Message copied by Dennie Maizes on Fri Oct 25, 2011  1:33 PM ------      Message from: Mariel Sleet, ERIC S      Created: Fri Oct 25, 2011 11:05 AM       Labs good same RX.

## 2011-10-25 NOTE — Progress Notes (Signed)
Labs drawn today for cbc/diff 

## 2011-11-22 ENCOUNTER — Encounter (HOSPITAL_COMMUNITY): Payer: Medicare Other | Attending: Oncology

## 2011-11-22 DIAGNOSIS — D45 Polycythemia vera: Secondary | ICD-10-CM | POA: Insufficient documentation

## 2011-11-22 LAB — DIFFERENTIAL
Basophils Absolute: 0.1 10*3/uL (ref 0.0–0.1)
Basophils Relative: 1 % (ref 0–1)
Eosinophils Relative: 4 % (ref 0–5)
Monocytes Absolute: 0.5 10*3/uL (ref 0.1–1.0)

## 2011-11-22 LAB — CBC
HCT: 38.6 % (ref 36.0–46.0)
MCHC: 33.2 g/dL (ref 30.0–36.0)
MCV: 99.2 fL (ref 78.0–100.0)
RDW: 14.8 % (ref 11.5–15.5)

## 2011-11-22 NOTE — Progress Notes (Signed)
[name] presented for [employer]. Labs per MD order drawn via Peripheral Line 25 gauge needle inserted in LAC.  Procedure without incident.  Patient tolerated procedure well.

## 2011-12-19 ENCOUNTER — Encounter (HOSPITAL_COMMUNITY): Payer: Medicare Other | Attending: Oncology

## 2011-12-19 DIAGNOSIS — D45 Polycythemia vera: Secondary | ICD-10-CM | POA: Insufficient documentation

## 2011-12-19 LAB — DIFFERENTIAL
Basophils Absolute: 0.1 10*3/uL (ref 0.0–0.1)
Basophils Relative: 2 % — ABNORMAL HIGH (ref 0–1)
Monocytes Absolute: 0.4 10*3/uL (ref 0.1–1.0)
Neutro Abs: 3.7 10*3/uL (ref 1.7–7.7)
Neutrophils Relative %: 64 % (ref 43–77)

## 2011-12-19 LAB — CBC
MCHC: 33.1 g/dL (ref 30.0–36.0)
Platelets: 408 10*3/uL — ABNORMAL HIGH (ref 150–400)
RDW: 14.9 % (ref 11.5–15.5)

## 2011-12-19 NOTE — Progress Notes (Signed)
Elie Confer presented for [employer]. Labs per MD order drawn via Peripheral Line 25 gauge needle inserted in RAC  Procedure without incident.  Patient tolerated procedure well.

## 2011-12-20 ENCOUNTER — Other Ambulatory Visit (HOSPITAL_COMMUNITY): Payer: Medicare Other

## 2011-12-20 ENCOUNTER — Telehealth (HOSPITAL_COMMUNITY): Payer: Self-pay | Admitting: *Deleted

## 2011-12-20 NOTE — Telephone Encounter (Signed)
Message copied by Dennie Maizes on Fri Dec 20, 2011  9:31 AM ------      Message from: Mariel Sleet, ERIC S      Created: Fri Dec 20, 2011  8:48 AM       Perfect-same therapy and labs.

## 2011-12-20 NOTE — Telephone Encounter (Signed)
Spoke with pt as below. Pt verbalizes understanding. Pt to see MD and have labs on same day in February.

## 2011-12-24 ENCOUNTER — Encounter (HOSPITAL_COMMUNITY): Payer: Medicare Other

## 2012-01-03 ENCOUNTER — Ambulatory Visit (HOSPITAL_COMMUNITY): Payer: Medicare Other | Admitting: Oncology

## 2012-01-06 ENCOUNTER — Ambulatory Visit (HOSPITAL_COMMUNITY): Payer: Medicare Other | Admitting: Oncology

## 2012-01-17 ENCOUNTER — Encounter (HOSPITAL_COMMUNITY): Payer: Self-pay | Admitting: Oncology

## 2012-01-17 ENCOUNTER — Encounter (HOSPITAL_COMMUNITY): Payer: Medicare Other | Attending: Oncology | Admitting: Oncology

## 2012-01-17 DIAGNOSIS — D45 Polycythemia vera: Secondary | ICD-10-CM | POA: Insufficient documentation

## 2012-01-17 LAB — CBC
Hemoglobin: 13.3 g/dL (ref 12.0–15.0)
MCHC: 33.5 g/dL (ref 30.0–36.0)
RBC: 4.07 MIL/uL (ref 3.87–5.11)

## 2012-01-17 LAB — DIFFERENTIAL
Basophils Relative: 1 % (ref 0–1)
Monocytes Relative: 8 % (ref 3–12)
Neutro Abs: 4.4 10*3/uL (ref 1.7–7.7)
Neutrophils Relative %: 70 % (ref 43–77)

## 2012-01-17 NOTE — Progress Notes (Signed)
[name] presented for labwork. Labs per MD order drawn via Peripheral Line 23 gauge needle inserted in left AC  Good blood return present. Procedure without incident.  Needle removed intact. Patient tolerated procedure well.   

## 2012-01-17 NOTE — Progress Notes (Signed)
CC:   Kirk Ruths, M.D.  DIAGNOSES: 1. Polycythemia vera, and we diagnosed her in 07/2004.  She is now on     Hydrea one 3 times a week with excellent control. 2. Prolapse of the uterus for which she has seen Dr. Despina Hidden in the past. 3. Right ovarian resection 1965 for benign lesion with an incidental     appendectomy at the same time. 4. Colonoscopy 04/01/2008 finding a few internal hemorrhoids and a few     diverticula. 5. History of hypertension. 6. Penicillin allergy which gives her itching and a rash.  Kristena is here today.  She is still working essentially full time at [employer] here in town.  She is asymptomatic and has no problems tolerating the Hydrea.  Her labs are due today but her labs the last several months have been outstanding.  Her vital signs today are very stable.  Blood pressure is 151/71 right arm sitting position, weight is 125 pounds, which are stable.  Pulse 62 to 70 and regular.  Respirations 16 and unlabored.  She is afebrile and denies any pain.  She has no obvious hepatosplenomegaly.  Bowel sounds are normal.  Breast exam is negative for masses.  Heart shows a regular rhythm and rate without murmur, rub or gallop.  Lungs are clear.  There is no adenopathy.  She has no abdominal distention.  No peripheral edema.  So will continue the regimen.  Will see her back in 6 months.  Will do labs monthly and keep her going forward.  She is doing really quite well.    ______________________________ Ladona Horns. Mariel Sleet, MD ESN/MEDQ  D:  01/17/2012  T:  01/17/2012  Job:  [id number]

## 2012-01-17 NOTE — Progress Notes (Signed)
This office note has been dictated.

## 2012-01-17 NOTE — Patient Instructions (Signed)
[facility]  Discharge Instructions [name]  [id number] [birth year]  RECOMMENDATIONS MADE BY THE CONSULTANT AND ANY TEST RESULTS WILL BE SENT TO YOUR REFERRING DOCTOR.   EXAM FINDINGS BY MD TODAY AND SIGNS AND SYMPTOMS TO REPORT TO CLINIC OR PRIMARY MD: Exam findings are good as discussed by Dr. Mariel Sleet.  Please return in 6 months for a recheck.  You had labwork done today, and we will call you back if you have any abnormal results.  I acknowledge that I have been informed and understand all the instructions given to me and received a copy. I do not have any more questions at this time, but understand that I may call the Specialty Clinic at [facility] at [phone] during business hours should I have any further questions or need assistance in obtaining follow-up care.    __________________________________________  _____________  __________ Signature of Patient or Authorized Representative            Date                   Time    __________________________________________ Nurse's Signature

## 2012-01-31 ENCOUNTER — Other Ambulatory Visit (HOSPITAL_COMMUNITY): Payer: Self-pay | Admitting: Family Medicine

## 2012-01-31 DIAGNOSIS — Z139 Encounter for screening, unspecified: Secondary | ICD-10-CM

## 2012-02-07 ENCOUNTER — Ambulatory Visit (HOSPITAL_COMMUNITY): Payer: Medicare Other | Admitting: Oncology

## 2012-02-14 ENCOUNTER — Encounter (HOSPITAL_COMMUNITY): Payer: Medicare Other | Attending: Oncology

## 2012-02-14 ENCOUNTER — Telehealth (HOSPITAL_COMMUNITY): Payer: Self-pay | Admitting: *Deleted

## 2012-02-14 DIAGNOSIS — D45 Polycythemia vera: Secondary | ICD-10-CM | POA: Insufficient documentation

## 2012-02-14 LAB — CBC
Hemoglobin: 12.6 g/dL (ref 12.0–15.0)
MCH: 32.5 pg (ref 26.0–34.0)
RBC: 3.88 MIL/uL (ref 3.87–5.11)

## 2012-02-14 LAB — DIFFERENTIAL
Eosinophils Absolute: 0.2 10*3/uL (ref 0.0–0.7)
Lymphocytes Relative: 21 % (ref 12–46)
Neutro Abs: 3.6 10*3/uL (ref 1.7–7.7)
Neutrophils Relative %: 68 % (ref 43–77)

## 2012-02-14 NOTE — Telephone Encounter (Signed)
Message copied by Dennie Maizes on Fri Feb 14, 2012  1:33 PM ------      Message from: Mariel Sleet, ERIC S      Created: Fri Feb 14, 2012 11:42 AM       Perfect-same dose.

## 2012-02-14 NOTE — Progress Notes (Signed)
Spoke with pt instruct continue same dose of meds and continue monthly lab work.

## 2012-02-19 NOTE — Progress Notes (Signed)
Lab draw

## 2012-03-16 ENCOUNTER — Other Ambulatory Visit (HOSPITAL_COMMUNITY): Payer: Medicare Other

## 2012-03-20 ENCOUNTER — Encounter (HOSPITAL_COMMUNITY): Payer: Medicare Other | Attending: Oncology

## 2012-03-20 DIAGNOSIS — D45 Polycythemia vera: Secondary | ICD-10-CM | POA: Insufficient documentation

## 2012-03-20 LAB — CBC
MCH: 32.4 pg (ref 26.0–34.0)
MCHC: 32.8 g/dL (ref 30.0–36.0)
MCV: 98.8 fL (ref 78.0–100.0)
Platelets: 371 10*3/uL (ref 150–400)
RDW: 14.3 % (ref 11.5–15.5)

## 2012-03-20 LAB — DIFFERENTIAL
Basophils Absolute: 0.1 10*3/uL (ref 0.0–0.1)
Eosinophils Absolute: 0.2 10*3/uL (ref 0.0–0.7)
Eosinophils Relative: 4 % (ref 0–5)
Monocytes Absolute: 0.4 10*3/uL (ref 0.1–1.0)

## 2012-03-20 NOTE — Progress Notes (Signed)
Lab draw

## 2012-04-17 ENCOUNTER — Encounter (HOSPITAL_COMMUNITY): Payer: Medicare Other | Attending: Oncology

## 2012-04-17 DIAGNOSIS — D45 Polycythemia vera: Secondary | ICD-10-CM

## 2012-04-17 LAB — CBC
MCHC: 33.1 g/dL (ref 30.0–36.0)
Platelets: 351 10*3/uL (ref 150–400)
RDW: 14.2 % (ref 11.5–15.5)
WBC: 6 10*3/uL (ref 4.0–10.5)

## 2012-04-17 LAB — DIFFERENTIAL
Basophils Absolute: 0.1 10*3/uL (ref 0.0–0.1)
Basophils Relative: 2 % — ABNORMAL HIGH (ref 0–1)
Eosinophils Relative: 4 % (ref 0–5)
Lymphocytes Relative: 23 % (ref 12–46)
Neutro Abs: 3.8 10*3/uL (ref 1.7–7.7)

## 2012-04-17 NOTE — Progress Notes (Signed)
Labs drawn today for cbc/diff 

## 2012-04-20 ENCOUNTER — Other Ambulatory Visit (HOSPITAL_COMMUNITY): Payer: Self-pay | Admitting: Family Medicine

## 2012-04-20 DIAGNOSIS — Z139 Encounter for screening, unspecified: Secondary | ICD-10-CM

## 2012-04-24 ENCOUNTER — Other Ambulatory Visit (HOSPITAL_COMMUNITY): Payer: Medicare Other

## 2012-05-13 ENCOUNTER — Other Ambulatory Visit (HOSPITAL_COMMUNITY): Payer: Self-pay

## 2012-05-13 ENCOUNTER — Telehealth (HOSPITAL_COMMUNITY): Payer: Self-pay

## 2012-05-13 DIAGNOSIS — D45 Polycythemia vera: Secondary | ICD-10-CM

## 2012-05-13 MED ORDER — HYDROXYUREA 500 MG PO CAPS: 500.0000 mg | ORAL_CAPSULE | ORAL | Status: DC

## 2012-05-13 NOTE — Telephone Encounter (Signed)
Message copied by Sterling Big on Wed May 13, 2012 12:49 PM ------      Message from: Mariel Sleet, ERIC S      Created: Wed May 13, 2012  9:10 AM       Refill hydrea X 6.

## 2012-05-13 NOTE — Telephone Encounter (Signed)
Uses [facility] and takes Hydrea 500 mg on Mondays, Wednesdays and Fridays.  Per patient, she received 1 pill from [facility] to cover Monday and  today but has none after that.

## 2012-05-13 NOTE — Telephone Encounter (Signed)
Hydrea refills x 6 called into [facility].  Patient notified.

## 2012-05-14 NOTE — Telephone Encounter (Signed)
Was this addressed yesterday?    If not, please call in refill x 2

## 2012-05-14 NOTE — Telephone Encounter (Signed)
Yes this was done 5/29

## 2012-05-19 ENCOUNTER — Other Ambulatory Visit (HOSPITAL_COMMUNITY): Payer: Self-pay

## 2012-05-19 DIAGNOSIS — D45 Polycythemia vera: Secondary | ICD-10-CM

## 2012-05-22 ENCOUNTER — Encounter (HOSPITAL_COMMUNITY): Payer: Medicare Other | Attending: Oncology

## 2012-05-22 ENCOUNTER — Telehealth (HOSPITAL_COMMUNITY): Payer: Self-pay

## 2012-05-22 DIAGNOSIS — D45 Polycythemia vera: Secondary | ICD-10-CM

## 2012-05-22 LAB — CBC
HCT: 39.2 % (ref 36.0–46.0)
Hemoglobin: 13 g/dL (ref 12.0–15.0)
MCHC: 33.2 g/dL (ref 30.0–36.0)
RDW: 14.3 % (ref 11.5–15.5)
WBC: 6.1 10*3/uL (ref 4.0–10.5)

## 2012-05-22 LAB — DIFFERENTIAL
Basophils Absolute: 0.1 10*3/uL (ref 0.0–0.1)
Basophils Relative: 1 % (ref 0–1)
Lymphocytes Relative: 19 % (ref 12–46)
Monocytes Absolute: 0.5 10*3/uL (ref 0.1–1.0)
Neutro Abs: 4.2 10*3/uL (ref 1.7–7.7)
Neutrophils Relative %: 68 % (ref 43–77)

## 2012-05-22 NOTE — Progress Notes (Signed)
[name] presented for [employer]. Labs per MD order drawn via Peripheral Line 25 gauge needle inserted in lt ac.   Procedure without incident.  Needle removed intact. Patient tolerated procedure well.

## 2012-05-22 NOTE — Telephone Encounter (Signed)
Patient called to inform of lab results

## 2012-06-16 ENCOUNTER — Encounter (HOSPITAL_COMMUNITY): Payer: Medicare Other | Attending: Oncology

## 2012-06-16 DIAGNOSIS — D45 Polycythemia vera: Secondary | ICD-10-CM | POA: Insufficient documentation

## 2012-06-16 LAB — DIFFERENTIAL
Basophils Relative: 2 % — ABNORMAL HIGH (ref 0–1)
Lymphs Abs: 1.5 10*3/uL (ref 0.7–4.0)
Monocytes Absolute: 0.5 10*3/uL (ref 0.1–1.0)
Monocytes Relative: 8 % (ref 3–12)
Neutro Abs: 3.6 10*3/uL (ref 1.7–7.7)

## 2012-06-16 LAB — CBC
HCT: 36.1 % (ref 36.0–46.0)
Hemoglobin: 12.2 g/dL (ref 12.0–15.0)
MCHC: 33.8 g/dL (ref 30.0–36.0)
RBC: 3.69 MIL/uL — ABNORMAL LOW (ref 3.87–5.11)

## 2012-06-16 NOTE — Progress Notes (Signed)
[name] presented for [employer]. Labs per MD order drawn via Peripheral Line 23 gauge needle inserted in left antecubital.  Good blood return present. Procedure without incident.  Needle removed intact. Patient tolerated procedure well.  Pt reports taking Hydrea 1 capsule on Mon Wed and Fri.

## 2012-06-17 ENCOUNTER — Other Ambulatory Visit (HOSPITAL_COMMUNITY): Payer: Self-pay

## 2012-06-17 ENCOUNTER — Telehealth (HOSPITAL_COMMUNITY): Payer: Self-pay

## 2012-06-17 DIAGNOSIS — D45 Polycythemia vera: Secondary | ICD-10-CM

## 2012-06-17 NOTE — Telephone Encounter (Signed)
Message copied by Evelena Leyden on Wed Jun 17, 2012  9:37 AM ------      Message from: Mariel Sleet, ERIC S      Created: Wed Jun 17, 2012  9:08 AM       Labs great-same dose

## 2012-06-17 NOTE — Telephone Encounter (Signed)
Patient notified to continue same dosage of Hydrea and labs to be done on 8/2 when she comes for her follow-up appointment.  Verbalized understanding.

## 2012-07-14 ENCOUNTER — Other Ambulatory Visit (HOSPITAL_COMMUNITY): Payer: Medicare Other

## 2012-07-17 ENCOUNTER — Other Ambulatory Visit (HOSPITAL_COMMUNITY): Payer: Medicare Other

## 2012-07-17 ENCOUNTER — Ambulatory Visit (HOSPITAL_COMMUNITY): Payer: Medicare Other | Admitting: Oncology

## 2012-08-07 ENCOUNTER — Encounter (HOSPITAL_COMMUNITY): Payer: Medicare Other | Attending: Oncology | Admitting: Oncology

## 2012-08-07 ENCOUNTER — Encounter (HOSPITAL_COMMUNITY): Payer: Medicare Other

## 2012-08-07 VITALS — BP 135/75 | HR 77 | Temp 98.3°F | Resp 18 | Ht 63.75 in | Wt 127.6 lb

## 2012-08-07 DIAGNOSIS — D45 Polycythemia vera: Secondary | ICD-10-CM

## 2012-08-07 LAB — CBC
HCT: 41 % (ref 36.0–46.0)
Hemoglobin: 13.5 g/dL (ref 12.0–15.0)
MCH: 32.4 pg (ref 26.0–34.0)
MCHC: 32.9 g/dL (ref 30.0–36.0)
MCV: 98.3 fL (ref 78.0–100.0)

## 2012-08-07 NOTE — Progress Notes (Signed)
Problem #1 polycythemia vera diagnosed August 2005 on Hydrea 500 mg 3 times a week with excellent control. Blood counts from today are pending.  Her vital signs are fine her review of systems is oncologicall he negative and she tolerates the Hydrea extremely well. We will change her blood work every 6 weeks since he has been so stable. She is still working full-time at [employer]. We'll see her in 6 months and I will check his cmet in October when she is due for her next blood count.

## 2012-08-07 NOTE — Progress Notes (Signed)
Labs drawn today for cbc 

## 2012-08-07 NOTE — Patient Instructions (Signed)
[facility]  Discharge Instructions [name]  DOB [birth year] CSN [id number]  MRN [id number] Dr. Glenford Peers  RECOMMENDATIONS MADE BY THE CONSULTANT AND ANY TEST RESULTS WILL BE SENT TO YOUR REFERRING DOCTOR.   EXAM FINDINGS BY MD TODAY AND SIGNS AND SYMPTOMS TO REPORT TO CLINIC OR PRIMARY MD:  You are doing great!  MEDICATIONS PRESCRIBED: continue the same dose of Hydrea   INSTRUCTIONS GIVEN AND DISCUSSED: We will change labs to every 6 weeks See you in 6 months  I acknowledge that I have been informed and understand all the instructions given to me and received a copy. I do not have any more questions at this time, but understand that I may call the Specialty Clinic at [facility] at [phone] during business hours should I have any further questions or need assistance in obtaining follow-up care.    __________________________________________  _____________  __________ Signature of Patient or Authorized Representative            Date                   Time    __________________________________________ Nurse's Signature

## 2012-08-14 ENCOUNTER — Ambulatory Visit (HOSPITAL_COMMUNITY)
Admission: RE | Admit: 2012-08-14 | Discharge: 2012-08-14 | Disposition: A | Discharge status: Home / Self Care | Payer: Medicare Other | Source: Ambulatory Visit | Attending: Family Medicine | Admitting: Family Medicine

## 2012-08-14 DIAGNOSIS — Z1231 Encounter for screening mammogram for malignant neoplasm of breast: Secondary | ICD-10-CM | POA: Insufficient documentation

## 2012-08-14 DIAGNOSIS — Z139 Encounter for screening, unspecified: Secondary | ICD-10-CM

## 2012-09-15 ENCOUNTER — Encounter (HOSPITAL_COMMUNITY): Payer: Medicare Other | Attending: Oncology

## 2012-09-15 DIAGNOSIS — D45 Polycythemia vera: Secondary | ICD-10-CM

## 2012-09-15 LAB — CBC WITH DIFFERENTIAL/PLATELET
Basophils Absolute: 0.1 10*3/uL (ref 0.0–0.1)
Basophils Relative: 2 % — ABNORMAL HIGH (ref 0–1)
Eosinophils Absolute: 0.2 10*3/uL (ref 0.0–0.7)
Eosinophils Relative: 4 % (ref 0–5)
HCT: 36.8 % (ref 36.0–46.0)
MCH: 32.5 pg (ref 26.0–34.0)
MCHC: 33.4 g/dL (ref 30.0–36.0)
MCV: 97.1 fL (ref 78.0–100.0)
Monocytes Absolute: 0.5 10*3/uL (ref 0.1–1.0)
Platelets: 381 10*3/uL (ref 150–400)
RDW: 14.4 % (ref 11.5–15.5)
WBC: 6.3 10*3/uL (ref 4.0–10.5)

## 2012-09-15 NOTE — Progress Notes (Signed)
[name] presented for [employer]. Labs per MD order drawn via Peripheral Line 25 gauge needle inserted in rt ac.  Good blood return present. Procedure without incident.  Needle removed intact. Patient tolerated procedure well. Pt is on hydrea and takes 1 every mon-wed-fri

## 2012-10-23 ENCOUNTER — Other Ambulatory Visit (HOSPITAL_COMMUNITY): Payer: Self-pay | Admitting: Oncology

## 2012-10-23 DIAGNOSIS — D45 Polycythemia vera: Secondary | ICD-10-CM

## 2012-10-23 MED ORDER — HYDROXYUREA 500 MG PO CAPS: 500.0000 mg | ORAL_CAPSULE | ORAL | Status: DC

## 2012-10-23 MED ORDER — HYDROXYUREA 500 MG PO CAPS: ORAL_CAPSULE | ORAL | Status: DC

## 2012-10-27 ENCOUNTER — Encounter (HOSPITAL_COMMUNITY): Payer: Medicare Other | Attending: Oncology

## 2012-10-27 DIAGNOSIS — D45 Polycythemia vera: Secondary | ICD-10-CM | POA: Insufficient documentation

## 2012-10-27 LAB — CBC WITH DIFFERENTIAL/PLATELET
Eosinophils Absolute: 0.2 10*3/uL (ref 0.0–0.7)
Hemoglobin: 12.7 g/dL (ref 12.0–15.0)
Lymphs Abs: 1.4 10*3/uL (ref 0.7–4.0)
MCH: 32.1 pg (ref 26.0–34.0)
Monocytes Relative: 9 % (ref 3–12)
Neutrophils Relative %: 65 % (ref 43–77)
RBC: 3.96 MIL/uL (ref 3.87–5.11)

## 2012-10-27 NOTE — Progress Notes (Signed)
[name] presented for [employer]. Labs per MD order drawn via Peripheral Line 23 gauge needle inserted in lt ac  Good blood return present. Procedure without incident.  Needle removed intact. Patient tolerated procedure well.  Hydrea 500mg  MWF only

## 2012-11-15 DIAGNOSIS — J111 Influenza due to unidentified influenza virus with other respiratory manifestations: Secondary | ICD-10-CM

## 2012-11-15 HISTORY — DX: Influenza due to unidentified influenza virus with other respiratory manifestations: J11.1

## 2012-12-07 ENCOUNTER — Encounter (HOSPITAL_COMMUNITY): Payer: Medicare Other | Attending: Oncology

## 2012-12-07 DIAGNOSIS — D45 Polycythemia vera: Secondary | ICD-10-CM | POA: Insufficient documentation

## 2012-12-07 LAB — CBC WITH DIFFERENTIAL/PLATELET
Basophils Absolute: 0.1 K/uL (ref 0.0–0.1)
Basophils Relative: 1 % (ref 0–1)
Eosinophils Absolute: 0.2 K/uL (ref 0.0–0.7)
Eosinophils Relative: 3 % (ref 0–5)
HCT: 38.1 % (ref 36.0–46.0)
Hemoglobin: 12.7 g/dL (ref 12.0–15.0)
Lymphocytes Relative: 24 % (ref 12–46)
Lymphs Abs: 1.7 K/uL (ref 0.7–4.0)
MCH: 32.5 pg (ref 26.0–34.0)
MCHC: 33.3 g/dL (ref 30.0–36.0)
MCV: 97.4 fL (ref 78.0–100.0)
Monocytes Absolute: 0.5 K/uL (ref 0.1–1.0)
Monocytes Relative: 7 % (ref 3–12)
Neutro Abs: 4.4 K/uL (ref 1.7–7.7)
Neutrophils Relative %: 65 % (ref 43–77)
Platelets: 366 K/uL (ref 150–400)
RBC: 3.91 MIL/uL (ref 3.87–5.11)
RDW: 14.4 % (ref 11.5–15.5)
WBC: 6.9 K/uL (ref 4.0–10.5)

## 2012-12-07 NOTE — Progress Notes (Unsigned)
[name] presented for [employer]. Labs per MD order drawn via Peripheral Line 23 gauge needle inserted in left AC  Good blood return present. Procedure without incident.  Needle removed intact. Patient tolerated procedure well.

## 2013-01-18 ENCOUNTER — Other Ambulatory Visit (HOSPITAL_COMMUNITY): Payer: Medicare Other

## 2013-01-22 ENCOUNTER — Encounter (HOSPITAL_COMMUNITY): Payer: Medicare Other | Attending: Oncology

## 2013-01-22 DIAGNOSIS — D45 Polycythemia vera: Secondary | ICD-10-CM | POA: Insufficient documentation

## 2013-01-22 LAB — CBC WITH DIFFERENTIAL/PLATELET
Eosinophils Absolute: 0.1 10*3/uL (ref 0.0–0.7)
Hemoglobin: 12.8 g/dL (ref 12.0–15.0)
Lymphocytes Relative: 8 % — ABNORMAL LOW (ref 12–46)
Lymphs Abs: 0.9 10*3/uL (ref 0.7–4.0)
MCH: 32.7 pg (ref 26.0–34.0)
Monocytes Relative: 4 % (ref 3–12)
Neutro Abs: 9 10*3/uL — ABNORMAL HIGH (ref 1.7–7.7)
Neutrophils Relative %: 87 % — ABNORMAL HIGH (ref 43–77)
Platelets: 417 10*3/uL — ABNORMAL HIGH (ref 150–400)
RBC: 3.91 MIL/uL (ref 3.87–5.11)
WBC: 10.4 10*3/uL (ref 4.0–10.5)

## 2013-01-22 NOTE — Progress Notes (Signed)
Elie Confer presented for [employer]. Labs per MD order drawn via Peripheral Line 25 gauge needle inserted in lt ac.  Good blood return present. Procedure without incident.  Needle removed intact. Patient tolerated procedure well.

## 2013-02-12 ENCOUNTER — Ambulatory Visit (HOSPITAL_COMMUNITY): Payer: Medicare Other | Admitting: Oncology

## 2013-02-25 ENCOUNTER — Encounter (HOSPITAL_COMMUNITY): Payer: Self-pay | Admitting: Oncology

## 2013-02-25 DIAGNOSIS — D45 Polycythemia vera: Secondary | ICD-10-CM | POA: Insufficient documentation

## 2013-02-25 HISTORY — DX: Polycythemia vera: D45

## 2013-02-25 NOTE — Progress Notes (Signed)
Jonell Cluck, MD [address] [place] [address]  Polycythemia vera - Plan: CBC with Differential, amLODipine (NORVASC) 10 MG tablet, CBC with Differential, CBC with Differential, CBC with Differential, CANCELED: CBC with Differential  CURRENT THERAPY: Hydrea 500 mg 3 times per week.  INTERVAL HISTORY: [name] 77 y.o. female returns for  regular  visit for followup of polycythemia vera diagnosed August 2005 on Hydrea 500 mg 3 times a week with excellent control.  I personally reviewed and went over laboratory results with the patient. Labs from 01/22/13 Hgb was 12.8, WBC 10.4, Plt 417.    Her counts are stable and Hydrea 500 mg 3 times per weeks is keep her counts perfectly controlled. As a result, we will continue with the same dose and continue with CBCs every 6 weeks.   Hematologically, she denies any complaints.  ROS questioning is negative.     Past Medical History  Diagnosis Date  . Hypertension   . Osteoporosis   . Prolapse, uterus, congenital     ring placement  . Polycythemia   . Polycythemia vera 02/25/2013    Diagnosed August 2005 on Hydrea 500 mg 3 times a week with excellent control.   . Influenza 11/2012    has Polycythemia vera on her problem list.     is allergic to codeine; diovan; penicillins; and ramipril.  [name] had no medications administered during this visit.  Past Surgical History  Procedure Laterality Date  . Opperectomy    . Oophorectomy      left  . Colonoscopy    . Appendectomy      Denies any headaches, dizziness, double vision, fevers, chills, night sweats, nausea, vomiting, diarrhea, constipation, chest pain, heart palpitations, shortness of breath, blood in stool, black tarry stool, urinary pain, urinary burning, urinary frequency, hematuria.   PHYSICAL EXAMINATION  ECOG PERFORMANCE STATUS: 0 - Asymptomatic  Filed Vitals:   02/26/13 1100  BP: 156/79  Pulse: 78  Temp: 98.6 F (37 C)  Resp: 18     GENERAL:alert, healthy, no distress, well nourished, well developed, comfortable, cooperative and smiling SKIN: skin color, texture, turgor are normal, no rashes or significant lesions HEAD: Normocephalic, No masses, lesions, tenderness or abnormalities EYES: normal, Conjunctiva are pink and non-injected EARS: External ears normal OROPHARYNX:mucous membranes are moist  NECK: supple, no adenopathy, thyroid normal size, non-tender, without nodularity, no stridor, non-tender, trachea midline LYMPH:  no palpable lymphadenopathy, no hepatosplenomegaly BREAST:not examined LUNGS: clear to auscultation and percussion HEART: regular rate & rhythm, no murmurs, no gallops, S1 normal and S2 normal ABDOMEN:abdomen soft, non-tender, normal bowel sounds, no masses or organomegaly and no hepatosplenomegaly BACK: Back symmetric, no curvature., No CVA tenderness EXTREMITIES:less then 2 second capillary refill, no joint deformities, effusion, or inflammation, no edema, no skin discoloration, no clubbing, no cyanosis  NEURO: alert & oriented x 3 with fluent speech, no focal motor/sensory deficits, gait normal    LABORATORY DATA: CBC    Component Value Date/Time   WBC 10.4 01/22/2013 1455   RBC 3.91 01/22/2013 1455   HGB 12.8 01/22/2013 1455   HCT 38.5 01/22/2013 1455   PLT 417* 01/22/2013 1455   MCV 98.5 01/22/2013 1455   MCH 32.7 01/22/2013 1455   MCHC 33.2 01/22/2013 1455   RDW 14.6 01/22/2013 1455   LYMPHSABS 0.9 01/22/2013 1455   MONOABS 0.4 01/22/2013 1455   EOSABS 0.1 01/22/2013 1455   BASOSABS 0.0 01/22/2013 1455    RADIOGRAPHIC STUDIES:  08/14/2013  *  RADIOLOGY REPORT*  Clinical Data: Screening.  DIGITAL BILATERAL SCREENING MAMMOGRAM WITH CAD  Comparison: Previous exams.  Findings: The breast tissue is heterogeneously dense. No  suspicious masses, architectural distortion, or calcifications are  present.  Images were processed with CAD.  IMPRESSION:  No mammographic evidence of malignancy.  A result  letter of this screening mammogram will be mailed directly  to the patient.  RECOMMENDATION:  Screening mammogram in one year. (Code:SM-B-01Y)  BI-RADS CATEGORY 1: Negative.  Original Report Authenticated By: Harrel Lemon, M.D.    ASSESSMENT:  1. Polycythemia vera diagnosed August 2005 on Hydrea 500 mg 3 times a week with excellent control.   PLAN:  1. I personally reviewed and went over laboratory results with the patient. 2. I personally reviewed and went over radiographic studies with the patient. 3. Next screening mammogram is due on 07/2013 4. CBC diff every 6 weeks. 5. Continue same dose of Hydrea 500 mg three times per week.  6. Return in 6 months for follow-up.  All questions were answered. The patient knows to call the clinic with any problems, questions or concerns. We can certainly see the patient much sooner if necessary.  The patient and plan discussed with Glenford Peers, MD and he is in agreement with the aforementioned.  KEFALAS,Belem Hintze

## 2013-02-26 ENCOUNTER — Encounter (HOSPITAL_COMMUNITY): Payer: Self-pay | Admitting: Oncology

## 2013-02-26 ENCOUNTER — Encounter (HOSPITAL_COMMUNITY): Payer: Medicare Other | Attending: Oncology | Admitting: Oncology

## 2013-02-26 VITALS — BP 156/79 | HR 78 | Temp 98.6°F | Resp 18 | Wt 128.0 lb

## 2013-02-26 DIAGNOSIS — D45 Polycythemia vera: Secondary | ICD-10-CM | POA: Insufficient documentation

## 2013-02-26 LAB — CBC WITH DIFFERENTIAL/PLATELET
Eosinophils Relative: 3 % (ref 0–5)
Hemoglobin: 12.5 g/dL (ref 12.0–15.0)
Lymphocytes Relative: 16 % (ref 12–46)
Lymphs Abs: 1.5 10*3/uL (ref 0.7–4.0)
MCV: 97.7 fL (ref 78.0–100.0)
Monocytes Relative: 6 % (ref 3–12)
Platelets: 386 10*3/uL (ref 150–400)
RBC: 3.85 MIL/uL — ABNORMAL LOW (ref 3.87–5.11)
WBC: 8.9 10*3/uL (ref 4.0–10.5)

## 2013-02-26 NOTE — Patient Instructions (Addendum)
[facility] Discharge Instructions  RECOMMENDATIONS MADE BY THE CONSULTANT AND ANY TEST RESULTS WILL BE SENT TO YOUR REFERRING PHYSICIAN.  Lab work today. Lab work every 6 weeks. Continue medications as prescribed. Return to clinic in 6 months to see physician. Report any issues/concerns to clinic as needed prior to appointments.  Thank you for choosing [facility] to provide your oncology and hematology care.  To afford each patient quality time with our providers, please arrive at least 15 minutes before your scheduled appointment time.  With your help, our goal is to use those 15 minutes to complete the necessary work-up to ensure our physicians have the information they need to help with your evaluation and healthcare recommendations.    Effective January 1st, 2014, we ask that you re-schedule your appointment with our physicians should you arrive 10 or more minutes late for your appointment.  We strive to give you quality time with our providers, and arriving late affects you and other patients whose appointments are after yours.    Again, thank you for choosing [facility].  Our hope is that these requests will decrease the amount of time that you wait before being seen by our physicians.       _____________________________________________________________  Should you have questions after your visit to [facility], please contact our office at [phone] between the hours of 8:30 a.m. and 5:00 p.m.  Voicemails left after 4:30 p.m. will not be returned until the following business day.  For prescription refill requests, have your pharmacy contact our office with your prescription refill request.

## 2013-04-05 ENCOUNTER — Encounter (HOSPITAL_COMMUNITY): Payer: Medicare Other | Attending: Oncology

## 2013-04-05 DIAGNOSIS — D45 Polycythemia vera: Secondary | ICD-10-CM | POA: Insufficient documentation

## 2013-04-05 LAB — CBC WITH DIFFERENTIAL/PLATELET
Basophils Relative: 1 % (ref 0–1)
Eosinophils Absolute: 0.3 10*3/uL (ref 0.0–0.7)
Eosinophils Relative: 3 % (ref 0–5)
Hemoglobin: 12.7 g/dL (ref 12.0–15.0)
Lymphs Abs: 1.6 10*3/uL (ref 0.7–4.0)
MCH: 32.4 pg (ref 26.0–34.0)
MCHC: 34.2 g/dL (ref 30.0–36.0)
MCV: 94.6 fL (ref 78.0–100.0)
Monocytes Relative: 8 % (ref 3–12)
Neutrophils Relative %: 68 % (ref 43–77)
RBC: 3.92 MIL/uL (ref 3.87–5.11)

## 2013-04-05 NOTE — Progress Notes (Signed)
[name] presented for [employer]. Labs per MD order drawn via Peripheral Line 23 gauge needle inserted in lt ac  Good blood return present. Procedure without incident.  Needle removed intact. Patient tolerated procedure well.

## 2013-04-06 ENCOUNTER — Telehealth (HOSPITAL_COMMUNITY): Payer: Self-pay

## 2013-04-06 NOTE — Telephone Encounter (Signed)
Patient called regarding labs and instructions to continue same dose of Hydrea.

## 2013-04-09 ENCOUNTER — Ambulatory Visit: Payer: Self-pay | Admitting: Obstetrics & Gynecology

## 2013-05-05 ENCOUNTER — Other Ambulatory Visit (HOSPITAL_COMMUNITY): Payer: Self-pay | Admitting: Oncology

## 2013-05-05 DIAGNOSIS — D45 Polycythemia vera: Secondary | ICD-10-CM

## 2013-05-05 MED ORDER — HYDROXYUREA 500 MG PO CAPS: ORAL_CAPSULE | ORAL | Status: DC

## 2013-05-06 ENCOUNTER — Encounter: Payer: Self-pay | Admitting: *Deleted

## 2013-05-07 ENCOUNTER — Ambulatory Visit (INDEPENDENT_AMBULATORY_CARE_PROVIDER_SITE_OTHER): Payer: Medicare Other | Admitting: Obstetrics & Gynecology

## 2013-05-07 ENCOUNTER — Encounter: Payer: Self-pay | Admitting: Obstetrics & Gynecology

## 2013-05-07 VITALS — BP 160/80 | Wt 127.0 lb

## 2013-05-07 DIAGNOSIS — N813 Complete uterovaginal prolapse: Secondary | ICD-10-CM

## 2013-05-07 NOTE — Progress Notes (Signed)
Patient ID: [name], female   DOB: 01/01/1936, 77 y.o.   MRN: [id number] Pessary removed cleaned replaced No complaints No discharge  No bleeding  Removed cleaned replaced without difficulty No undue vaginal pressure   Follow up 3 months

## 2013-05-17 ENCOUNTER — Encounter (HOSPITAL_COMMUNITY): Payer: Medicare Other | Attending: Oncology

## 2013-05-17 DIAGNOSIS — D45 Polycythemia vera: Secondary | ICD-10-CM | POA: Insufficient documentation

## 2013-05-17 LAB — CBC WITH DIFFERENTIAL/PLATELET
Basophils Absolute: 0.1 10*3/uL (ref 0.0–0.1)
Basophils Relative: 1 % (ref 0–1)
Eosinophils Absolute: 0.2 10*3/uL (ref 0.0–0.7)
Eosinophils Relative: 3 % (ref 0–5)
MCH: 32.2 pg (ref 26.0–34.0)
MCHC: 33.9 g/dL (ref 30.0–36.0)
MCV: 95.2 fL (ref 78.0–100.0)
Neutrophils Relative %: 72 % (ref 43–77)
Platelets: 385 10*3/uL (ref 150–400)
RDW: 14.1 % (ref 11.5–15.5)

## 2013-05-17 NOTE — Progress Notes (Signed)
[name] presented for [employer]. Labs per MD order drawn via Peripheral Line 23 gauge needle inserted in left antecubital.  Good blood return present. Procedure without incident.  Needle removed intact. Patient tolerated procedure well.

## 2013-06-28 ENCOUNTER — Other Ambulatory Visit (HOSPITAL_COMMUNITY): Payer: Medicare Other

## 2013-06-29 ENCOUNTER — Encounter (HOSPITAL_COMMUNITY): Payer: Medicare Other | Attending: Oncology

## 2013-06-29 DIAGNOSIS — D45 Polycythemia vera: Secondary | ICD-10-CM | POA: Insufficient documentation

## 2013-06-29 LAB — CBC WITH DIFFERENTIAL/PLATELET
Basophils Absolute: 0.1 10*3/uL (ref 0.0–0.1)
Eosinophils Relative: 4 % (ref 0–5)
Lymphocytes Relative: 18 % (ref 12–46)
MCV: 96.7 fL (ref 78.0–100.0)
Neutrophils Relative %: 70 % (ref 43–77)
Platelets: 416 10*3/uL — ABNORMAL HIGH (ref 150–400)
RBC: 3.98 MIL/uL (ref 3.87–5.11)
RDW: 14.7 % (ref 11.5–15.5)
WBC: 6.5 10*3/uL (ref 4.0–10.5)

## 2013-06-29 NOTE — Progress Notes (Signed)
[name]'s reason for visit today are for labs as scheduled per MD orders.  Venipuncture performed with a 23 gauge butterfly needle to L Antecubital.  [name] tolerated venipuncture well and without incident; questions were answered and patient was discharged.

## 2013-07-05 ENCOUNTER — Encounter: Payer: Self-pay | Admitting: Obstetrics & Gynecology

## 2013-07-05 ENCOUNTER — Ambulatory Visit (INDEPENDENT_AMBULATORY_CARE_PROVIDER_SITE_OTHER): Payer: Medicare Other | Admitting: Obstetrics & Gynecology

## 2013-07-05 DIAGNOSIS — N760 Acute vaginitis: Secondary | ICD-10-CM | POA: Insufficient documentation

## 2013-07-05 MED ORDER — METRONIDAZOLE 500 MG PO TABS: 500.0000 mg | ORAL_TABLET | Freq: Two times a day (BID) | ORAL | Status: DC

## 2013-07-05 NOTE — Progress Notes (Signed)
Patient ID: [name], female   DOB: 02/23/1936, 77 y.o.   MRN: [id number] Patient with pessary in place Treated for UTI recently C/o discharge  Exam  + BV  Metronidazole oral 500 BID x 7 days Keep scheduled follow up

## 2013-07-05 NOTE — Patient Instructions (Signed)
Vaginitis Vaginitis is an inflammation of the vagina. It is most often caused by a change in the normal balance of the bacteria and yeast that live in the vagina. This change in balance causes an overgrowth of certain bacteria or yeast, which causes the inflammation. There are different types of vaginitis, but the most common types are:  Bacterial vaginosis.  Yeast infection (candidiasis).  Trichomoniasis vaginitis. This is a sexually transmitted infection (STI).  Viral vaginitis.  Atropic vaginitis.  Allergic vaginitis. CAUSES  The cause depends on the type of vaginitis. Vaginitis can be caused by:  Bacteria (bacterial vaginosis).  Yeast (yeast infection).  A parasite (trichomoniasis vaginitis)  A virus (viral vaginitis).  Low hormone levels (atrophic vaginitis). Low hormone levels can occur during pregnancy, breastfeeding, or after menopause.  Irritants, such as bubble baths, scented tampons, and feminine sprays (allergic vaginitis). Other factors can change the normal balance of the yeast and bacteria that live in the vagina. These include:  Antibiotic medicines.  Poor hygiene.  Diaphragms, vaginal sponges, spermicides, birth control pills, and intrauterine devices (IUD).  Sexual intercourse.  Infection.  Uncontrolled diabetes.  A weakened immune system. SYMPTOMS  Symptoms can vary depending on the cause of the vaginitis. Common symptoms include:  Abnormal vaginal discharge.  The discharge is white, gray, or yellow with bacterial vaginosis.  The discharge is thick, white, and cheesy with a yeast infection.  The discharge is frothy and yellow or greenish with trichomoniasis.  A bad vaginal odor.  The odor is fishy with bacterial vaginosis.  Vaginal itching, pain, or swelling.  Painful intercourse.  Pain or burning when urinating. Sometimes, there are no symptoms. TREATMENT  Treatment will vary depending on the type of infection.   Bacterial  vaginosis and trichomoniasis are often treated with antibiotic creams or pills.  Yeast infections are often treated with antifungal medicines, such as vaginal creams or suppositories.  Viral vaginitis has no cure, but symptoms can be treated with medicines that relieve discomfort. Your sexual partner should be treated as well.  Atrophic vaginitis may be treated with an estrogen cream, pill, suppository, or vaginal ring. If vaginal dryness occurs, lubricants and moisturizing creams may help. You may be told to avoid scented soaps, sprays, or douches.  Allergic vaginitis treatment involves quitting the use of the product that is causing the problem. Vaginal creams can be used to treat the symptoms. HOME CARE INSTRUCTIONS   Take all medicines as directed by your caregiver.  Keep your genital area clean and dry. Avoid soap and only rinse the area with water.  Avoid douching. It can remove the healthy bacteria in the vagina.  Do not use tampons or have sexual intercourse until your vaginitis has been treated. Use sanitary pads while you have vaginitis.  Wipe from front to back. This avoids the spread of bacteria from the rectum to the vagina.  Let air reach your genital area.  Wear cotton underwear to decrease moisture buildup.  Avoid wearing underwear while you sleep until your vaginitis is gone.  Avoid tight pants and underwear or nylons without a cotton panel.  Take off wet clothing (especially bathing suits) as soon as possible.  Use mild, non-scented products. Avoid using irritants, such as:  Scented feminine sprays.  Fabric softeners.  Scented detergents.  Scented tampons.  Scented soaps or bubble baths.  Practice safe sex and use condoms. Condoms may prevent the spread of trichomoniasis and viral vaginitis. SEEK MEDICAL CARE IF:   You have abdominal pain.  You   have a fever or persistent symptoms for more than 2 3 days.  You have a fever and your symptoms suddenly  get worse. Document Released: 09/29/2007 Document Revised: 08/26/2012 Document Reviewed: 05/14/2012 [employer] Patient Information 2014 [employer].  

## 2013-07-19 ENCOUNTER — Other Ambulatory Visit: Payer: Medicare Other

## 2013-07-19 ENCOUNTER — Encounter: Payer: Medicare Other | Admitting: Obstetrics and Gynecology

## 2013-08-02 ENCOUNTER — Other Ambulatory Visit (HOSPITAL_COMMUNITY): Payer: Self-pay | Admitting: Family Medicine

## 2013-08-02 DIAGNOSIS — Z139 Encounter for screening, unspecified: Secondary | ICD-10-CM

## 2013-08-09 ENCOUNTER — Encounter (HOSPITAL_COMMUNITY): Payer: Medicare Other | Attending: Oncology

## 2013-08-09 ENCOUNTER — Other Ambulatory Visit (HOSPITAL_COMMUNITY): Payer: Medicare Other

## 2013-08-09 DIAGNOSIS — D45 Polycythemia vera: Secondary | ICD-10-CM | POA: Insufficient documentation

## 2013-08-09 LAB — CBC WITH DIFFERENTIAL/PLATELET
Basophils Absolute: 0.1 10*3/uL (ref 0.0–0.1)
HCT: 40.9 % (ref 36.0–46.0)
Lymphocytes Relative: 19 % (ref 12–46)
Lymphs Abs: 1.3 10*3/uL (ref 0.7–4.0)
Monocytes Absolute: 0.5 10*3/uL (ref 0.1–1.0)
Neutro Abs: 4.7 10*3/uL (ref 1.7–7.7)
Platelets: 421 10*3/uL — ABNORMAL HIGH (ref 150–400)
RBC: 4.18 MIL/uL (ref 3.87–5.11)
RDW: 15 % (ref 11.5–15.5)
WBC: 6.7 10*3/uL (ref 4.0–10.5)

## 2013-08-09 NOTE — Progress Notes (Signed)
[name] presented for [employer]. Labs per MD order drawn via Peripheral Line 23 gauge needle inserted in LT AC  Good blood return present. Procedure without incident.  Needle removed intact. Patient tolerated procedure well.  Hydrea 1 tab M-W-F

## 2013-08-09 NOTE — Addendum Note (Signed)
Addended by: Oda Kilts on: 08/09/2013 11:03 AM   Modules accepted: Orders

## 2013-08-27 ENCOUNTER — Ambulatory Visit (HOSPITAL_COMMUNITY)
Admission: RE | Admit: 2013-08-27 | Discharge: 2013-08-27 | Disposition: A | Discharge status: Home / Self Care | Payer: Medicare Other | Source: Ambulatory Visit | Attending: Family Medicine | Admitting: Family Medicine

## 2013-08-27 DIAGNOSIS — Z1231 Encounter for screening mammogram for malignant neoplasm of breast: Secondary | ICD-10-CM | POA: Insufficient documentation

## 2013-08-27 DIAGNOSIS — Z139 Encounter for screening, unspecified: Secondary | ICD-10-CM

## 2013-08-27 DIAGNOSIS — R928 Other abnormal and inconclusive findings on diagnostic imaging of breast: Secondary | ICD-10-CM | POA: Insufficient documentation

## 2013-08-30 ENCOUNTER — Other Ambulatory Visit (HOSPITAL_COMMUNITY): Payer: Medicare Other

## 2013-09-01 ENCOUNTER — Other Ambulatory Visit: Payer: Self-pay | Admitting: Family Medicine

## 2013-09-01 DIAGNOSIS — R928 Other abnormal and inconclusive findings on diagnostic imaging of breast: Secondary | ICD-10-CM

## 2013-09-03 ENCOUNTER — Encounter (HOSPITAL_COMMUNITY): Payer: Medicare Other | Attending: Hematology and Oncology

## 2013-09-03 ENCOUNTER — Encounter (HOSPITAL_COMMUNITY): Payer: Self-pay

## 2013-09-03 VITALS — BP 123/62 | HR 62 | Temp 97.6°F | Resp 16 | Wt 124.4 lb

## 2013-09-03 DIAGNOSIS — D45 Polycythemia vera: Secondary | ICD-10-CM | POA: Insufficient documentation

## 2013-09-03 LAB — COMPREHENSIVE METABOLIC PANEL
AST: 23 U/L (ref 0–37)
Albumin: 3.8 g/dL (ref 3.5–5.2)
Alkaline Phosphatase: 91 U/L (ref 39–117)
BUN: 10 mg/dL (ref 6–23)
Creatinine, Ser: 0.7 mg/dL (ref 0.50–1.10)
Potassium: 3.8 mEq/L (ref 3.5–5.1)
Total Protein: 7.1 g/dL (ref 6.0–8.3)

## 2013-09-03 LAB — LACTATE DEHYDROGENASE: LDH: 178 U/L (ref 94–250)

## 2013-09-03 LAB — CBC
HCT: 37.3 % (ref 36.0–46.0)
MCHC: 33.2 g/dL (ref 30.0–36.0)
Platelets: 374 10*3/uL (ref 150–400)
RDW: 14.6 % (ref 11.5–15.5)
WBC: 5.8 10*3/uL (ref 4.0–10.5)

## 2013-09-03 NOTE — Progress Notes (Signed)
[facility] OFFICE PROGRESS NOTE  Jonell Cluck, MD [address] [place] [address]  DIAGNOSIS: Polycythemia vera  Chief Complaint  Patient presents with  . Follow-up    CURRENT THERAPY: Hydroxyurea 500 mg 3 times a week  INTERVAL HISTORY: [name] 77 y.o. female returns for followup of setting you vera taking Hydrea 500 mg 3 times a week. Appetite has been good with the logic, vomiting, diarrhea, or constipation. Approximately 3 months ago she required antibiotics for urinary tract infection and does have a pessary in place for your uterine prolapse. You'll get a flu shot from her family doctor. She's had regular bowel movements with no cough, wheezing, PND, orthopnea, palpitations, headache, vaginal bleeding, epistaxis, melena, hematochezia, immaturity, skin rash, headache or seizures. She was evaluated by urology including MRI and cystoscopy without any anatomical abnormality noted other than the uterine prolapse. She had her pessary changed every 3 months and intends to do so next week.  MEDICAL HISTORY: Past Medical History  Diagnosis Date  . Hypertension   . Osteoporosis   . Prolapse, uterus, congenital     ring placement  . Polycythemia   . Polycythemia vera(238.4) 02/25/2013    Diagnosed August 2005 on Hydrea 500 mg 3 times a week with excellent control.   . Influenza 11/2012    INTERIM HISTORY: has Polycythemia vera and Vaginitis and vulvovaginitis, unspecified on her problem list.    ALLERGIES:  is allergic to codeine; diovan; penicillins; and ramipril.  MEDICATIONS: has a current medication list which includes the following prescription(s): amlodipine, aspirin, calcium carbonate-vitamin d, and hydroxyurea.  SURGICAL HISTORY:  Past Surgical History  Procedure Laterality Date  . Opperectomy    . Oophorectomy      left  . Colonoscopy    . Appendectomy      REVIEW OF SYSTEMS: Other than that discussed above is  noncontributory.   PHYSICAL EXAMINATION: ECOG PERFORMANCE STATUS: 0 - Asymptomatic  Blood pressure 123/62, pulse 62, temperature 97.6 F (36.4 C), temperature source Oral, resp. rate 16, weight 124 lb 6.4 oz (56.427 kg).  GENERAL:alert, no distress and comfortable SKIN: skin color, texture, turgor are normal, no rashes or significant lesions EYES: normal, Conjunctiva are pink and non-injected, sclera clear OROPHARYNX:no exudate, no erythema and lips, buccal mucosa, and tongue normal  NECK: supple, thyroid normal size, non-tender, without nodularity LYMPH:  no palpable lymphadenopathy in the cervical, axillary or inguinal LUNGS: clear to auscultation and percussion with normal breathing effort HEART: regular rate & rhythm and no murmurs and no lower extremity edema ABDOMEN:abdomen soft, non-tender and normal bowel sounds Musculoskeletal:no cyanosis of digits and no clubbing  NEURO: alert & oriented x 3 with fluent speech, no focal motor/sensory deficits   LABORATORY DATA: Infusion on 08/09/2013  Component Date Value Range Status  . WBC 08/09/2013 6.7  4.0 - 10.5 K/uL Final  . RBC 08/09/2013 4.18  3.87 - 5.11 MIL/uL Final  . Hemoglobin 08/09/2013 13.3  12.0 - 15.0 g/dL Final  . HCT 47/82/9562 40.9  36.0 - 46.0 % Final  . MCV 08/09/2013 97.8  78.0 - 100.0 fL Final  . MCH 08/09/2013 31.8  26.0 - 34.0 pg Final  . MCHC 08/09/2013 32.5  30.0 - 36.0 g/dL Final  . RDW 13/07/6577 15.0  11.5 - 15.5 % Final  . Platelets 08/09/2013 421* 150 - 400 K/uL Final  . Neutrophils Relative % 08/09/2013 70  43 - 77 % Final  . Neutro Abs 08/09/2013 4.7  1.7 - 7.7 K/uL Final  . Lymphocytes Relative 08/09/2013 19  12 - 46 % Final  . Lymphs Abs 08/09/2013 1.3  0.7 - 4.0 K/uL Final  . Monocytes Relative 08/09/2013 8  3 - 12 % Final  . Monocytes Absolute 08/09/2013 0.5  0.1 - 1.0 K/uL Final  . Eosinophils Relative 08/09/2013 2  0 - 5 % Final  . Eosinophils Absolute 08/09/2013 0.2  0.0 - 0.7 K/uL Final  .  Basophils Relative 08/09/2013 1  0 - 1 % Final  . Basophils Absolute 08/09/2013 0.1  0.0 - 0.1 K/uL Final     Urinalysis    Component Value Date/Time   COLORURINE YELLOW 03/06/2009 1313   APPEARANCEUR CLEAR 03/06/2009 1313   LABSPEC 1.005 03/06/2009 1313   PHURINE 5.5 03/06/2009 1313   GLUCOSEU NEGATIVE 03/06/2009 1313   HGBUR TRACE* 03/06/2009 1313   BILIRUBINUR NEGATIVE 03/06/2009 1313   KETONESUR NEGATIVE 03/06/2009 1313   PROTEINUR NEGATIVE 03/06/2009 1313   UROBILINOGEN 0.2 03/06/2009 1313   NITRITE NEGATIVE 03/06/2009 1313   LEUKOCYTESUR NEGATIVE 03/06/2009 1313    RADIOGRAPHIC STUDIES: Mm Digital Screening  08/30/2013   *RADIOLOGY REPORT*  Clinical Data: Screening.  DIGITAL SCREENING BILATERAL MAMMOGRAM WITH CAD  Comparison:  Previous exam(s).  FINDINGS:  ACR Breast Density Category c:  The breast tissue is heterogeneously dense, which may obscure small masses.  In the right breast, a possible mass warrants further evaluation with spot compression views and possibly ultrasound.  In the left breast, there are no findings suspicious for malignancy.  Images were processed with CAD.  IMPRESSION: Further evaluation is suggested for possible mass in the right breast.  RECOMMENDATION: Diagnostic mammogram and possibly ultrasound of the right breast. (Code:[id number]-63M)  The patient will be contacted regarding the findings, and additional imaging will be scheduled.  BI-RADS CATEGORY 0:  Incomplete.  Need additional imaging evaluation and/or prior mammograms for comparison.   Original Report Authenticated By: Jerene Dilling, M.D.    ASSESSMENT: #1. Polycythemia vera, controlled. #2 uterine prolapse with urinary tract infection, currently using a pessary, status post urologic evaluation. #3 osteoporosis.   PLAN: #1. Continue hydroxyurea 500 mg 3 times a week and call if any new symptoms occur that are troublesome and persistent. The patient has an understanding of the strategy.   All questions  were answered. The patient knows to call the clinic with any problems, questions or concerns. We can certainly see the patient much sooner if necessary.  The patient and plan discussed with Alla German A and he is in agreement with the aforementioned.  I spent 30 minutes counseling the patient face to face. The total time spent in the appointment was 25 minutes.    Maurilio Lovely, MD 09/03/2013 9:14 AM

## 2013-09-03 NOTE — Addendum Note (Signed)
Addended by: Evelena Leyden on: 09/03/2013 09:39 AM   Modules accepted: Orders

## 2013-09-03 NOTE — Patient Instructions (Addendum)
[facility] Discharge Instructions  RECOMMENDATIONS MADE BY THE CONSULTANT AND ANY TEST RESULTS WILL BE SENT TO YOUR REFERRING PHYSICIAN.  EXAM FINDINGS BY THE PHYSICIAN TODAY AND SIGNS OR SYMPTOMS TO REPORT TO CLINIC OR PRIMARY PHYSICIAN: Exam and findings as discussed by Dr. Zigmund Daniel. You are doing well.  Will check some labs today.  If there are any problems we will call you.  MEDICATIONS PRESCRIBED:  none  INSTRUCTIONS/FOLLOW-UP: Blood work in 3 months and follow-up in 6 months.  Thank you for choosing [facility] to provide your oncology and hematology care.  To afford each patient quality time with our providers, please arrive at least 15 minutes before your scheduled appointment time.  With your help, our goal is to use those 15 minutes to complete the necessary work-up to ensure our physicians have the information they need to help with your evaluation and healthcare recommendations.    Effective January 1st, 2014, we ask that you re-schedule your appointment with our physicians should you arrive 10 or more minutes late for your appointment.  We strive to give you quality time with our providers, and arriving late affects you and other patients whose appointments are after yours.    Again, thank you for choosing [facility].  Our hope is that these requests will decrease the amount of time that you wait before being seen by our physicians.       _____________________________________________________________  Should you have questions after your visit to [facility], please contact our office at [phone] between the hours of 8:30 a.m. and 5:00 p.m.  Voicemails left after 4:30 p.m. will not be returned until the following business day.  For prescription refill requests, have your pharmacy contact our office with your prescription refill request.

## 2013-09-03 NOTE — Progress Notes (Signed)
[name] presented for [employer]. Labs per MD order drawn via Peripheral Line 23 gauge needle inserted in left AC  Good blood return present. Procedure without incident.  Needle removed intact. Patient tolerated procedure well.

## 2013-09-08 ENCOUNTER — Other Ambulatory Visit: Payer: Self-pay | Admitting: Family Medicine

## 2013-09-08 ENCOUNTER — Ambulatory Visit (HOSPITAL_COMMUNITY)
Admission: RE | Admit: 2013-09-08 | Discharge: 2013-09-08 | Disposition: A | Discharge status: Home / Self Care | Payer: Medicare Other | Source: Ambulatory Visit | Attending: Family Medicine | Admitting: Family Medicine

## 2013-09-08 DIAGNOSIS — R928 Other abnormal and inconclusive findings on diagnostic imaging of breast: Secondary | ICD-10-CM

## 2013-09-10 ENCOUNTER — Ambulatory Visit: Payer: Medicare Other | Admitting: Obstetrics & Gynecology

## 2013-09-20 ENCOUNTER — Other Ambulatory Visit (HOSPITAL_COMMUNITY): Payer: Medicare Other

## 2013-10-12 ENCOUNTER — Encounter: Payer: Self-pay | Admitting: Obstetrics & Gynecology

## 2013-10-12 ENCOUNTER — Ambulatory Visit (INDEPENDENT_AMBULATORY_CARE_PROVIDER_SITE_OTHER): Payer: Medicare Other | Admitting: Obstetrics & Gynecology

## 2013-10-12 VITALS — BP 142/70 | Wt 124.0 lb

## 2013-10-12 DIAGNOSIS — N813 Complete uterovaginal prolapse: Secondary | ICD-10-CM

## 2013-10-12 MED ORDER — CLINDAMYCIN PHOSPHATE 2 % VA CREA: 1.0000 | TOPICAL_CREAM | Freq: Every day | VAGINAL | Status: DC

## 2013-10-12 NOTE — Progress Notes (Signed)
Patient ID: [name], female   DOB: 1936-01-27, 77 y.o.   MRN: [id number] Pessary removed and replaced, cleaned  Will try a course of metro gel Follow up in 4 months

## 2013-10-12 NOTE — Addendum Note (Signed)
Addended by: Lazaro Arms on: 10/12/2013 02:35 PM   Modules accepted: Orders

## 2013-12-02 ENCOUNTER — Encounter (HOSPITAL_COMMUNITY): Payer: Medicare Other | Attending: Hematology and Oncology

## 2013-12-02 DIAGNOSIS — D45 Polycythemia vera: Secondary | ICD-10-CM

## 2013-12-02 LAB — CBC
Hemoglobin: 13.5 g/dL (ref 12.0–15.0)
MCH: 32.1 pg (ref 26.0–34.0)
MCV: 98.1 fL (ref 78.0–100.0)
Platelets: 389 10*3/uL (ref 150–400)
RBC: 4.2 MIL/uL (ref 3.87–5.11)
RDW: 14.5 % (ref 11.5–15.5)

## 2013-12-02 NOTE — Progress Notes (Signed)
Labs drawn today for cbc 

## 2013-12-03 ENCOUNTER — Other Ambulatory Visit (HOSPITAL_COMMUNITY): Payer: Medicare Other

## 2013-12-14 ENCOUNTER — Other Ambulatory Visit (HOSPITAL_COMMUNITY): Payer: Self-pay | Admitting: Oncology

## 2014-01-12 ENCOUNTER — Ambulatory Visit: Payer: Medicare Other | Admitting: Obstetrics & Gynecology

## 2014-01-31 ENCOUNTER — Ambulatory Visit: Payer: Medicare Other | Admitting: Obstetrics & Gynecology

## 2014-02-16 ENCOUNTER — Encounter: Payer: Self-pay | Admitting: Obstetrics & Gynecology

## 2014-02-16 ENCOUNTER — Ambulatory Visit (INDEPENDENT_AMBULATORY_CARE_PROVIDER_SITE_OTHER): Payer: Medicare Other | Admitting: Obstetrics & Gynecology

## 2014-02-16 VITALS — BP 140/80 | Ht 64.0 in | Wt 122.0 lb

## 2014-02-16 DIAGNOSIS — N813 Complete uterovaginal prolapse: Secondary | ICD-10-CM

## 2014-02-16 NOTE — Progress Notes (Signed)
Patient ID: [name], female   DOB: 07-Nov-1936, 78 y.o.   MRN: [id number] Patient ID: [name], female   DOB: 05/02/1936, 78 y.o.   MRN: [id number] Pessary removed and replaced, cleaned  Will try a course of metro gel Follow up in 4 months  Past Medical History  Diagnosis Date  . Hypertension   . Osteoporosis   . Prolapse, uterus, congenital     ring placement  . Polycythemia   . Polycythemia vera(238.4) 02/25/2013    Diagnosed August 2005 on Hydrea 500 mg 3 times a week with excellent control.   . Influenza 11/2012    Past Surgical History  Procedure Laterality Date  . Opperectomy    . Oophorectomy      left  . Colonoscopy    . Appendectomy      OB History   Grav Para Term Preterm Abortions TAB SAB Ect Mult Living   1 1        1       Allergies  Allergen Reactions  . Codeine     Nausea and vomiting  . Diovan [Valsartan]     Rash and itching   . Penicillins     Rash and "breaking out in whelps"   . Ramipril     coughing    History   Social History  . Marital Status: Divorced    Spouse Name: N/A    Number of Children: N/A  . Years of Education: N/A   Social History Main Topics  . Smoking status: Never Smoker   . Smokeless tobacco: Never Used  . Alcohol Use: No  . Drug Use: No  . Sexual Activity: Yes    Birth Control/ Protection: None   Other Topics Concern  . None   Social History Narrative  . None    Family History  Problem Relation Age of Onset  . Cancer Brother     throat cancer  . Cancer Brother     prostate cancer  . Cancer Brother     prostate cancer

## 2014-03-03 ENCOUNTER — Encounter (HOSPITAL_COMMUNITY): Payer: Medicare Other | Attending: Hematology and Oncology

## 2014-03-03 ENCOUNTER — Encounter (HOSPITAL_BASED_OUTPATIENT_CLINIC_OR_DEPARTMENT_OTHER): Payer: Medicare Other

## 2014-03-03 ENCOUNTER — Encounter (HOSPITAL_COMMUNITY): Payer: Self-pay

## 2014-03-03 VITALS — BP 131/68 | HR 71 | Temp 97.9°F | Resp 16 | Wt 120.1 lb

## 2014-03-03 DIAGNOSIS — D45 Polycythemia vera: Secondary | ICD-10-CM

## 2014-03-03 DIAGNOSIS — M810 Age-related osteoporosis without current pathological fracture: Secondary | ICD-10-CM

## 2014-03-03 DIAGNOSIS — I10 Essential (primary) hypertension: Secondary | ICD-10-CM | POA: Insufficient documentation

## 2014-03-03 DIAGNOSIS — Z09 Encounter for follow-up examination after completed treatment for conditions other than malignant neoplasm: Secondary | ICD-10-CM | POA: Insufficient documentation

## 2014-03-03 LAB — COMPREHENSIVE METABOLIC PANEL
ALK PHOS: 99 U/L (ref 39–117)
ALT: 13 U/L (ref 0–35)
AST: 23 U/L (ref 0–37)
Albumin: 4 g/dL (ref 3.5–5.2)
BILIRUBIN TOTAL: 0.6 mg/dL (ref 0.3–1.2)
BUN: 15 mg/dL (ref 6–23)
CHLORIDE: 104 meq/L (ref 96–112)
CO2: 29 mEq/L (ref 19–32)
Calcium: 9.3 mg/dL (ref 8.4–10.5)
Creatinine, Ser: 0.64 mg/dL (ref 0.50–1.10)
GFR calc non Af Amer: 85 mL/min — ABNORMAL LOW (ref >90)
GLUCOSE: 91 mg/dL (ref 70–99)
POTASSIUM: 3.9 meq/L (ref 3.7–5.3)
Sodium: 144 mEq/L (ref 137–147)
Total Protein: 7.5 g/dL (ref 6.0–8.3)

## 2014-03-03 LAB — CBC WITH DIFFERENTIAL/PLATELET
BASOS ABS: 0.1 10*3/uL (ref 0.0–0.1)
BASOS PCT: 2 % — AB (ref 0–1)
EOS ABS: 0.2 10*3/uL (ref 0.0–0.7)
Eosinophils Relative: 3 % (ref 0–5)
HCT: 42.1 % (ref 36.0–46.0)
Hemoglobin: 13.9 g/dL (ref 12.0–15.0)
Lymphocytes Relative: 28 % (ref 12–46)
Lymphs Abs: 1.9 10*3/uL (ref 0.7–4.0)
MCH: 32.5 pg (ref 26.0–34.0)
MCHC: 33 g/dL (ref 30.0–36.0)
MCV: 98.4 fL (ref 78.0–100.0)
Monocytes Absolute: 0.6 10*3/uL (ref 0.1–1.0)
Monocytes Relative: 9 % (ref 3–12)
NEUTROS ABS: 3.8 10*3/uL (ref 1.7–7.7)
NEUTROS PCT: 57 % (ref 43–77)
Platelets: 457 10*3/uL — ABNORMAL HIGH (ref 150–400)
RBC: 4.28 MIL/uL (ref 3.87–5.11)
RDW: 14.6 % (ref 11.5–15.5)
WBC: 6.7 10*3/uL (ref 4.0–10.5)

## 2014-03-03 LAB — LACTATE DEHYDROGENASE: LDH: 190 U/L (ref 94–250)

## 2014-03-03 NOTE — Progress Notes (Signed)
Labs drawn today for cmp,ldh,cbc/diff 

## 2014-03-03 NOTE — Patient Instructions (Signed)
[facility] Discharge Instructions  RECOMMENDATIONS MADE BY THE CONSULTANT AND ANY TEST RESULTS WILL BE SENT TO YOUR REFERRING PHYSICIAN.  EXAM FINDINGS BY THE PHYSICIAN TODAY AND SIGNS OR SYMPTOMS TO REPORT TO CLINIC OR PRIMARY PHYSICIAN: Exam and findings as discussed by Dr. Barnet Glasgow.  You are doing well.  We will let you know if you need to make any changes in your hydrea dosage.    MEDICATIONS PRESCRIBED:  none  INSTRUCTIONS/FOLLOW-UP: Follow-up in 9 months with blood work and office visit.  Thank you for choosing [facility] to provide your oncology and hematology care.  To afford each patient quality time with our providers, please arrive at least 15 minutes before your scheduled appointment time.  With your help, our goal is to use those 15 minutes to complete the necessary work-up to ensure our physicians have the information they need to help with your evaluation and healthcare recommendations.    Effective January 1st, 2014, we ask that you re-schedule your appointment with our physicians should you arrive 10 or more minutes late for your appointment.  We strive to give you quality time with our providers, and arriving late affects you and other patients whose appointments are after yours.    Again, thank you for choosing [facility].  Our hope is that these requests will decrease the amount of time that you wait before being seen by our physicians.       _____________________________________________________________  Should you have questions after your visit to [facility], please contact our office at [phone] between the hours of 8:30 a.m. and 5:00 p.m.  Voicemails left after 4:30 p.m. will not be returned until the following business day.  For prescription refill requests, have your pharmacy contact our office with your prescription refill request.     Polycythemia Vera  Polycythemia [name] is a condition in which  the body makes too many red blood cells and there is no known cause. The red blood cells (erythrocytes) are the cells which carry the oxygen in your blood stream to the cells of your body. Because of the increased red blood cells, the blood becomes thicker and does not circulate as well. It would be similar to your car having oil which is too thick so it cannot start and circulate as well. When the blood is too thick it often causes headaches and dizziness. It may also cause blood clots. Even though the blood clots easier, these patients bleed easier. The bleeding is caused because the blood cells which help stop bleeding (platelets) do not function normally. It occurs in all age groups but is more common in the 27 to 97 year age range. TREATMENT  The treatment of polycythemia vera for many years has been blood removal (phlebotomy) which is similar to blood removal in a blood bank, however this blood is not used for donation. Hydroxyurea is used to supplement phlebotomy. Aspirin is commonly given to thin the blood as long as the patient does not have a problem with bleeding. Other drugs are used based on the progression of the disease. Document Released: 08/27/2001 Document Revised: 02/24/2012 Document Reviewed: 03/03/2009 [facility] Patient Information 2014 [place], [place].

## 2014-03-03 NOTE — Progress Notes (Signed)
[facility]  OFFICE PROGRESS NOTE  Virgel Paling, MD [address] [place] [address]  DIAGNOSIS: Polycythemia vera(238.4) - Plan: CBC with Differential  Chief Complaint  Patient presents with  . Polycythemia vera    Hydrea therapy    CURRENT THERAPY: Hydrea 500 mg 3 times a week.  INTERVAL HISTORY: [name] 78 y.o. female returns for followup of polycythemia vera while taking Hydrea 500 mg 3 times per week. After 35 years of work, the patient retired in March of 2014. She continues on her medication and denies any nausea, vomiting, PND, orthopnea, palpitations, easy satiety, fever, night sweats, or pruritus. She does evidence of any hematuria was extensively evaluated with cystoscopy and MRI about 8 years ago with negative findings regarding tumor. Hematuria was attributed to urinary tract infections. She denies any nocturia, dysuria, or urinary incontinence at this time.   MEDICAL HISTORY: Past Medical History  Diagnosis Date  . Hypertension   . Osteoporosis   . Prolapse, uterus, congenital     ring placement  . Polycythemia   . Polycythemia vera(238.4) 02/25/2013    Diagnosed August 2005 on Hydrea 500 mg 3 times a week with excellent control.   . Influenza 11/2012    INTERIM HISTORY: has Polycythemia vera; Vaginitis and vulvovaginitis, unspecified; and Uterovaginal prolapse, complete on her problem list.    ALLERGIES:  is allergic to codeine; diovan; penicillins; and ramipril.  MEDICATIONS: has a current medication list which includes the following prescription(s): amlodipine, aspirin, calcium carbonate-vitamin d, and hydroxyurea.  SURGICAL HISTORY:  Past Surgical History  Procedure Laterality Date  . Opperectomy    . Oophorectomy      left  . Colonoscopy    . Appendectomy      FAMILY HISTORY: family history includes Cancer in her brother, brother, and brother.  SOCIAL HISTORY:  reports  that she has never smoked. She has never used smokeless tobacco. She reports that she does not drink alcohol or use illicit drugs.  REVIEW OF SYSTEMS:  Other than that discussed above is noncontributory.  PHYSICAL EXAMINATION: ECOG PERFORMANCE STATUS: 1 - Symptomatic but completely ambulatory  Blood pressure 131/68, pulse 71, temperature 97.9 F (36.6 C), temperature source Oral, resp. rate 16, weight 120 lb 1.6 oz (54.477 kg).  GENERAL:alert, no distress and comfortable SKIN: skin color, texture, turgor are normal, no rashes or significant lesions EYES: PERLA; Conjunctiva are pink and non-injected, sclera clear SINUSES: No redness or tenderness over maxillary or ethmoid sinuses OROPHARYNX:no exudate, no erythema on lips, buccal mucosa, or tongue. NECK: supple, thyroid normal size, non-tender, without nodularity. No masses CHEST: Normal AP diameter with no breast masses. LYMPH:  no palpable lymphadenopathy in the cervical, axillary or inguinal LUNGS: clear to auscultation and percussion with normal breathing effort HEART: regular rate & rhythm and no murmurs. ABDOMEN:abdomen soft, non-tender and normal bowel sounds. Spleen not palpable. MUSCULOSKELETAL:no cyanosis of digits and no clubbing. Range of motion normal.  NEURO: alert & oriented x 3 with fluent speech, no focal motor/sensory deficits   LABORATORY DATA: Infusion on 03/03/2014  Component Date Value Ref Range Status  . Sodium 03/03/2014 144  137 - 147 mEq/L Final  . Potassium 03/03/2014 3.9  3.7 - 5.3 mEq/L Final  . Chloride 03/03/2014 104  96 - 112 mEq/L Final  . CO2 03/03/2014 29  19 - 32 mEq/L Final  . Glucose, Bld 03/03/2014 91  70 - 99 mg/dL Final  .  BUN 03/03/2014 15  6 - 23 mg/dL Final  . Creatinine, Ser 03/03/2014 0.64  0.50 - 1.10 mg/dL Final  . Calcium 03/03/2014 9.3  8.4 - 10.5 mg/dL Final  . Total Protein 03/03/2014 7.5  6.0 - 8.3 g/dL Final  . Albumin 03/03/2014 4.0  3.5 - 5.2 g/dL Final  . AST 03/03/2014 23   0 - 37 U/L Final  . ALT 03/03/2014 13  0 - 35 U/L Final  . Alkaline Phosphatase 03/03/2014 99  39 - 117 U/L Final  . Total Bilirubin 03/03/2014 0.6  0.3 - 1.2 mg/dL Final  . GFR calc non Af Amer 03/03/2014 85* >90 mL/min Final  . GFR calc Af Amer 03/03/2014 >90  >90 mL/min Final   Comment: (NOTE)                          The eGFR has been calculated using the CKD EPI equation.                          This calculation has not been validated in all clinical situations.                          eGFR's persistently <90 mL/min signify possible Chronic Kidney                          Disease.  Marland Kitchen LDH 03/03/2014 190  94 - 250 U/L Final  . WBC 03/03/2014 6.7  4.0 - 10.5 K/uL Final  . RBC 03/03/2014 4.28  3.87 - 5.11 MIL/uL Final  . Hemoglobin 03/03/2014 13.9  12.0 - 15.0 g/dL Final  . HCT 03/03/2014 42.1  36.0 - 46.0 % Final  . MCV 03/03/2014 98.4  78.0 - 100.0 fL Final  . MCH 03/03/2014 32.5  26.0 - 34.0 pg Final  . MCHC 03/03/2014 33.0  30.0 - 36.0 g/dL Final  . RDW 03/03/2014 14.6  11.5 - 15.5 % Final  . Platelets 03/03/2014 457* 150 - 400 K/uL Final  . Neutrophils Relative % 03/03/2014 57  43 - 77 % Final  . Neutro Abs 03/03/2014 3.8  1.7 - 7.7 K/uL Final  . Lymphocytes Relative 03/03/2014 28  12 - 46 % Final  . Lymphs Abs 03/03/2014 1.9  0.7 - 4.0 K/uL Final  . Monocytes Relative 03/03/2014 9  3 - 12 % Final  . Monocytes Absolute 03/03/2014 0.6  0.1 - 1.0 K/uL Final  . Eosinophils Relative 03/03/2014 3  0 - 5 % Final  . Eosinophils Absolute 03/03/2014 0.2  0.0 - 0.7 K/uL Final  . Basophils Relative 03/03/2014 2* 0 - 1 % Final  . Basophils Absolute 03/03/2014 0.1  0.0 - 0.1 K/uL Final    PATHOLOGY: Peripheral smear failed to reveal evidence of premature forms.  Urinalysis    Component Value Date/Time   COLORURINE YELLOW 03/06/2009 1313   APPEARANCEUR CLEAR 03/06/2009 1313   LABSPEC 1.005 03/06/2009 1313   PHURINE 5.5 03/06/2009 1313   GLUCOSEU NEGATIVE 03/06/2009 1313   HGBUR TRACE*  03/06/2009 1313   BILIRUBINUR NEGATIVE 03/06/2009 1313   KETONESUR NEGATIVE 03/06/2009 1313   PROTEINUR NEGATIVE 03/06/2009 1313   UROBILINOGEN 0.2 03/06/2009 1313   NITRITE NEGATIVE 03/06/2009 1313   LEUKOCYTESUR NEGATIVE 03/06/2009 1313    RADIOGRAPHIC STUDIES: Diagnostic Unilat R Status: Final result  Study Result    *RADIOLOGY REPORT*  Clinical Data: The patient was recalled from screening for  possible right breast mass.  DIGITAL DIAGNOSTIC RIGHT MAMMOGRAM WITH CAD AND RIGHT BREAST  ULTRASOUND:  Comparison: Priors dating back to 06/23/2009.  Findings:  ACR Breast Density Category c: The breast tissue is  heterogeneously dense, which may obscure small masses.  Right CC spot compression, true lateral and rolled medial views  were obtained. Questioned mass within the right breast centrally  on the CC view resolved with additional imaging, most compatible  with overlapping fibroglandular breast tissue. As there is  residual density in this location, ultrasound will be performed.  Mammographic images were processed with CAD.  On physical exam, I palpate no discrete mass within the right  breast.  Ultrasound is performed, showing no definite discrete mass within  the central aspect of the right breast.  IMPRESSION:  Questioned mass within the right breast resolved with additional  imaging, compatible with overlapping fibroglandular breast tissue.  RECOMMENDATION:  Screening mammogram in one year. (Code:SM-B-01Y)  I have discussed the findings and recommendations with the patient.  Results were also provided in writing at the conclusion of the  visit. If applicable, a reminder letter will be sent to the  patient regarding the next appointment.  BI-RADS CATEGORY 1: Negative.  Original Report     ASSESSMENT:  #1. Polycythemia vera, controlled.  #2 uterine prolapse with urinary tract infection, currently using a pessary, status post urologic evaluation.  #3  osteoporosis    PLAN:  #1. Hydrea 500 mg 3 times per week. #2. Followup 9 months with CBC, chem profile, and LDH. Patient was told to call should any new symptoms occur that are troublesome and persistent.   All questions were answered. The patient knows to call the clinic with any problems, questions or concerns. We can certainly see the patient much sooner if necessary.   I spent 25 minutes counseling the patient face to face. The total time spent in the appointment was 30 minutes.    Doroteo Bradford, MD 03/03/2014 9:51 AM

## 2014-04-01 ENCOUNTER — Other Ambulatory Visit (HOSPITAL_COMMUNITY): Payer: Self-pay | Admitting: Oncology

## 2014-05-03 ENCOUNTER — Other Ambulatory Visit (HOSPITAL_COMMUNITY): Payer: Self-pay | Admitting: Family Medicine

## 2014-05-03 DIAGNOSIS — M810 Age-related osteoporosis without current pathological fracture: Secondary | ICD-10-CM

## 2014-05-03 DIAGNOSIS — Z0000 Encounter for general adult medical examination without abnormal findings: Secondary | ICD-10-CM

## 2014-05-11 ENCOUNTER — Other Ambulatory Visit (HOSPITAL_COMMUNITY): Payer: Medicare Other

## 2014-05-18 ENCOUNTER — Ambulatory Visit (HOSPITAL_COMMUNITY)
Admission: RE | Admit: 2014-05-18 | Discharge: 2014-05-18 | Disposition: A | Discharge status: Home / Self Care | Payer: Medicare Other | Source: Ambulatory Visit | Attending: Family Medicine | Admitting: Family Medicine

## 2014-05-18 DIAGNOSIS — Z0000 Encounter for general adult medical examination without abnormal findings: Secondary | ICD-10-CM

## 2014-05-18 DIAGNOSIS — M810 Age-related osteoporosis without current pathological fracture: Secondary | ICD-10-CM | POA: Insufficient documentation

## 2014-06-20 ENCOUNTER — Ambulatory Visit: Payer: Medicare Other | Admitting: Obstetrics & Gynecology

## 2014-06-28 ENCOUNTER — Encounter: Payer: Self-pay | Admitting: Obstetrics & Gynecology

## 2014-06-28 ENCOUNTER — Ambulatory Visit (INDEPENDENT_AMBULATORY_CARE_PROVIDER_SITE_OTHER): Payer: Medicare Other | Admitting: Obstetrics & Gynecology

## 2014-06-28 VITALS — BP 120/70 | Wt 124.0 lb

## 2014-06-28 DIAGNOSIS — N813 Complete uterovaginal prolapse: Secondary | ICD-10-CM

## 2014-06-28 NOTE — Progress Notes (Signed)
Patient ID: [name], female   DOB: 01/17/1936, 78 y.o.   MRN: [id number]      [name] presents today for routine follow up related to her pessary.   She uses a Milex ring with support #3 She reports no vaginal discharge or vaginal bleeding.  Exam reveals no undue vaginal mucosal pressure of breakdown, no discharge and no vaginal bleeding. NEFG  The pessary is removed, cleaned and replaced without difficulty.    [name] will be sen back in 4 months for continued follow up.  Florian Buff 06/28/2014 2:53 PM

## 2014-08-02 ENCOUNTER — Other Ambulatory Visit (HOSPITAL_COMMUNITY): Payer: Self-pay | Admitting: Family Medicine

## 2014-08-02 DIAGNOSIS — Z1231 Encounter for screening mammogram for malignant neoplasm of breast: Secondary | ICD-10-CM

## 2014-09-09 ENCOUNTER — Ambulatory Visit (HOSPITAL_COMMUNITY): Payer: Medicare Other

## 2014-09-12 ENCOUNTER — Ambulatory Visit (HOSPITAL_COMMUNITY)
Admission: RE | Admit: 2014-09-12 | Discharge: 2014-09-12 | Disposition: A | Discharge status: Home / Self Care | Payer: Medicare Other | Source: Ambulatory Visit | Attending: Family Medicine | Admitting: Family Medicine

## 2014-09-12 DIAGNOSIS — Z1231 Encounter for screening mammogram for malignant neoplasm of breast: Secondary | ICD-10-CM | POA: Diagnosis present

## 2014-10-17 ENCOUNTER — Encounter: Payer: Self-pay | Admitting: Obstetrics & Gynecology

## 2014-10-18 ENCOUNTER — Other Ambulatory Visit (HOSPITAL_COMMUNITY): Payer: Self-pay | Admitting: Oncology

## 2014-10-31 ENCOUNTER — Ambulatory Visit: Payer: Medicare Other | Admitting: Obstetrics & Gynecology

## 2014-11-08 ENCOUNTER — Encounter: Payer: Self-pay | Admitting: Obstetrics & Gynecology

## 2014-11-08 ENCOUNTER — Ambulatory Visit (INDEPENDENT_AMBULATORY_CARE_PROVIDER_SITE_OTHER): Payer: Medicare Other | Admitting: Obstetrics & Gynecology

## 2014-11-08 VITALS — BP 150/90 | Wt 125.0 lb

## 2014-11-08 DIAGNOSIS — N813 Complete uterovaginal prolapse: Secondary | ICD-10-CM

## 2014-11-08 NOTE — Progress Notes (Signed)
Patient ID: [name], female   DOB: [birth year], 78 y.o.   MRN: [id number]      [name] presents today for routine follow up related to her pessary.   She uses a milex ring with support #3 She reports no vaginal discharge or vaginal bleeding.  Exam reveals no undue vaginal mucosal pressure of breakdown, no discharge and no vaginal bleeding.  The pessary is removed, cleaned and replaced without difficulty.    TARIN JOHNDROW will be sen back in 4 months for continued follow up.  Levonne Carreras H 11/08/2014 3:11 PM

## 2014-12-02 ENCOUNTER — Encounter (HOSPITAL_COMMUNITY): Payer: Self-pay

## 2014-12-02 ENCOUNTER — Encounter (HOSPITAL_BASED_OUTPATIENT_CLINIC_OR_DEPARTMENT_OTHER): Payer: Medicare Other

## 2014-12-02 ENCOUNTER — Encounter (HOSPITAL_COMMUNITY): Payer: Medicare Other | Attending: Hematology and Oncology

## 2014-12-02 VITALS — BP 132/50 | HR 58 | Temp 98.2°F | Resp 18 | Wt 124.4 lb

## 2014-12-02 DIAGNOSIS — M810 Age-related osteoporosis without current pathological fracture: Secondary | ICD-10-CM | POA: Diagnosis not present

## 2014-12-02 DIAGNOSIS — D45 Polycythemia vera: Secondary | ICD-10-CM | POA: Insufficient documentation

## 2014-12-02 LAB — CBC WITH DIFFERENTIAL/PLATELET
Basophils Absolute: 0.1 10*3/uL (ref 0.0–0.1)
Basophils Relative: 2 % — ABNORMAL HIGH (ref 0–1)
EOS PCT: 4 % (ref 0–5)
Eosinophils Absolute: 0.2 10*3/uL (ref 0.0–0.7)
HCT: 39.5 % (ref 36.0–46.0)
Hemoglobin: 13 g/dL (ref 12.0–15.0)
LYMPHS ABS: 1.5 10*3/uL (ref 0.7–4.0)
LYMPHS PCT: 25 % (ref 12–46)
MCH: 33 pg (ref 26.0–34.0)
MCHC: 32.9 g/dL (ref 30.0–36.0)
MCV: 100.3 fL — AB (ref 78.0–100.0)
MONO ABS: 0.5 10*3/uL (ref 0.1–1.0)
MONOS PCT: 8 % (ref 3–12)
Neutro Abs: 3.7 10*3/uL (ref 1.7–7.7)
Neutrophils Relative %: 61 % (ref 43–77)
Platelets: 379 10*3/uL (ref 150–400)
RBC: 3.94 MIL/uL (ref 3.87–5.11)
RDW: 14.4 % (ref 11.5–15.5)
WBC: 6.1 10*3/uL (ref 4.0–10.5)

## 2014-12-02 LAB — COMPREHENSIVE METABOLIC PANEL
ALT: 12 U/L (ref 0–35)
AST: 21 U/L (ref 0–37)
Albumin: 3.9 g/dL (ref 3.5–5.2)
Alkaline Phosphatase: 92 U/L (ref 39–117)
Anion gap: 11 (ref 5–15)
BILIRUBIN TOTAL: 0.5 mg/dL (ref 0.3–1.2)
BUN: 11 mg/dL (ref 6–23)
CO2: 28 meq/L (ref 19–32)
CREATININE: 0.69 mg/dL (ref 0.50–1.10)
Calcium: 9 mg/dL (ref 8.4–10.5)
Chloride: 102 mEq/L (ref 96–112)
GFR calc Af Amer: >90 mL/min (ref >90)
GFR, EST NON AFRICAN AMERICAN: 82 mL/min — AB (ref >90)
Glucose, Bld: 90 mg/dL (ref 70–99)
Potassium: 3.8 mEq/L (ref 3.7–5.3)
Sodium: 141 mEq/L (ref 137–147)
Total Protein: 7.2 g/dL (ref 6.0–8.3)

## 2014-12-02 LAB — LACTATE DEHYDROGENASE: LDH: 191 U/L (ref 94–250)

## 2014-12-02 NOTE — Progress Notes (Signed)
LABS FOR CBCD,CMP,LDH 

## 2014-12-02 NOTE — Progress Notes (Signed)
[facility]  OFFICE PROGRESS NOTE  [name], MD [place] [address]  DIAGNOSIS: Polycythemia rubra vera - Plan: Comprehensive metabolic panel, Lactate dehydrogenase, CBC with Differential  Chief Complaint  Patient presents with  . Polycythemia vera    CURRENT THERAPY: Hydrea 500 mg orally 3 times a week.  INTERVAL HISTORY: [name] 78 y.o. female returns for follow-up of polycythemia vera while taking Hydrea 500 mg 3 times per week. Last hemoglobin done on 03/03/2014 was 13.9.g Appetite is good with no nausea, vomiting, easy satiety, fever, night sweats, headache, pruritus while showering, melena, hematochezia, hematuria, vaginal bleeding, lower extremity swelling or redness, chest pain, PND, orthopnea, palpitations, skin rash, headache, or seizures.  MEDICAL HISTORY: Past Medical History  Diagnosis Date  . Hypertension   . Osteoporosis   . Prolapse, uterus, congenital     ring placement  . Polycythemia   . Polycythemia vera(238.4) 02/25/2013    Diagnosed August 2005 on Hydrea 500 mg 3 times a week with excellent control.   . Influenza 11/2012    INTERIM HISTORY: has Polycythemia vera; Vaginitis and vulvovaginitis, unspecified; and Uterovaginal prolapse, complete on her problem list.    ALLERGIES:  is allergic to codeine; diovan; penicillins; and ramipril.  MEDICATIONS: has a current medication list which includes the following prescription(s): amlodipine, calcium carbonate-vitamin d, hydroxyurea, triamcinolone cream, and aspirin.  SURGICAL HISTORY:  Past Surgical History  Procedure Laterality Date  . Opperectomy    . Oophorectomy      left  . Colonoscopy    . Appendectomy      FAMILY HISTORY: family history includes Cancer in her brother, brother, and brother.  SOCIAL HISTORY:  reports that she has never smoked. She has never used smokeless tobacco. She reports that she does not drink  alcohol or use illicit drugs.  REVIEW OF SYSTEMS:  Other than that discussed above is noncontributory.  PHYSICAL EXAMINATION: ECOG PERFORMANCE STATUS: 0 - Asymptomatic  Blood pressure 132/50, pulse 58, temperature 98.2 F (36.8 C), temperature source Oral, resp. rate 18, weight 124 lb 6.4 oz (56.427 kg), SpO2 100 %.  GENERAL:alert, no distress and comfortable SKIN: skin color, texture, turgor are normal, no rashes or significant lesions EYES: PERLA; Conjunctiva are pink and non-injected, sclera clear SINUSES: No redness or tenderness over maxillary or ethmoid sinuses OROPHARYNX:no exudate, no erythema on lips, buccal mucosa, or tongue. NECK: supple, thyroid normal size, non-tender, without nodularity. No masses CHEST: Normal AP diameter with no breast masses. LYMPH:  no palpable lymphadenopathy in the cervical, axillary or inguinal LUNGS: clear to auscultation and percussion with normal breathing effort HEART: regular rate & rhythm and no murmurs. ABDOMEN:abdomen soft, non-tender and normal bowel sounds. Liver and spleen not enlarged. No free fluid wave or shifting dullness. MUSCULOSKELETAL:no cyanosis of digits and no clubbing. Range of motion normal.  NEURO: alert & oriented x 3 with fluent speech, no focal motor/sensory deficits   LABORATORY DATA: Lab on 12/02/2014  Component Date Value Ref Range Status  . Sodium 12/02/2014 141  137 - 147 mEq/L Final  . Potassium 12/02/2014 3.8  3.7 - 5.3 mEq/L Final  . Chloride 12/02/2014 102  96 - 112 mEq/L Final  . CO2 12/02/2014 28  19 - 32 mEq/L Final  . Glucose, Bld 12/02/2014 90  70 - 99 mg/dL Final  . BUN 12/02/2014 11  6 - 23 mg/dL Final  . Creatinine, Ser 12/02/2014 0.69  0.50 -  1.10 mg/dL Final  . Calcium 12/02/2014 9.0  8.4 - 10.5 mg/dL Final  . Total Protein 12/02/2014 7.2  6.0 - 8.3 g/dL Final  . Albumin 12/02/2014 3.9  3.5 - 5.2 g/dL Final  . AST 12/02/2014 21  0 - 37 U/L Final  . ALT 12/02/2014 12  0 - 35 U/L Final  .  Alkaline Phosphatase 12/02/2014 92  39 - 117 U/L Final  . Total Bilirubin 12/02/2014 0.5  0.3 - 1.2 mg/dL Final  . GFR calc non Af Amer 12/02/2014 82* >90 mL/min Final  . GFR calc Af Amer 12/02/2014 >90  >90 mL/min Final   Comment: (NOTE) The eGFR has been calculated using the CKD EPI equation. This calculation has not been validated in all clinical situations. eGFR's persistently <90 mL/min signify possible Chronic Kidney Disease.   . Anion gap 12/02/2014 11  5 - 15 Final  . LDH 12/02/2014 191  94 - 250 U/L Final  . WBC 12/02/2014 6.1  4.0 - 10.5 K/uL Final  . RBC 12/02/2014 3.94  3.87 - 5.11 MIL/uL Final  . Hemoglobin 12/02/2014 13.0  12.0 - 15.0 g/dL Final  . HCT 12/02/2014 39.5  36.0 - 46.0 % Final  . MCV 12/02/2014 100.3* 78.0 - 100.0 fL Final  . MCH 12/02/2014 33.0  26.0 - 34.0 pg Final  . MCHC 12/02/2014 32.9  30.0 - 36.0 g/dL Final  . RDW 12/02/2014 14.4  11.5 - 15.5 % Final  . Platelets 12/02/2014 379  150 - 400 K/uL Final  . Neutrophils Relative % 12/02/2014 61  43 - 77 % Final  . Neutro Abs 12/02/2014 3.7  1.7 - 7.7 K/uL Final  . Lymphocytes Relative 12/02/2014 25  12 - 46 % Final  . Lymphs Abs 12/02/2014 1.5  0.7 - 4.0 K/uL Final  . Monocytes Relative 12/02/2014 8  3 - 12 % Final  . Monocytes Absolute 12/02/2014 0.5  0.1 - 1.0 K/uL Final  . Eosinophils Relative 12/02/2014 4  0 - 5 % Final  . Eosinophils Absolute 12/02/2014 0.2  0.0 - 0.7 K/uL Final  . Basophils Relative 12/02/2014 2* 0 - 1 % Final  . Basophils Absolute 12/02/2014 0.1  0.0 - 0.1 K/uL Final   . PATHOLOGY: No new pathology.  Urinalysis    Component Value Date/Time   COLORURINE YELLOW 03/06/2009 1313   APPEARANCEUR CLEAR 03/06/2009 1313   LABSPEC 1.005 03/06/2009 1313   PHURINE 5.5 03/06/2009 1313   GLUCOSEU NEGATIVE 03/06/2009 1313   HGBUR TRACE* 03/06/2009 1313   BILIRUBINUR NEGATIVE 03/06/2009 1313   KETONESUR NEGATIVE 03/06/2009 1313   PROTEINUR NEGATIVE 03/06/2009 1313   UROBILINOGEN 0.2  03/06/2009 1313   NITRITE NEGATIVE 03/06/2009 1313   LEUKOCYTESUR NEGATIVE 03/06/2009 1313    RADIOGRAPHIC STUDIES: No results found.  ASSESSMENT:  #1. Polycythemia vera, excellent control. #2. Uterine prolapse with urinary tract infections in the past, and seeming to use a pessary with good control. #3. Osteoporosis.   PLAN:  #1. Patient has received influenza virus vaccine as well as pneumococcal vaccine. #2. Continue Hydrea 500 mg orally 3 times per week. #3. Follow-up in 9 months with CBC, chem profile, and LDH.   All questions were answered. The patient knows to call the clinic with any problems, questions or concerns. We can certainly see the patient much sooner if necessary.   I spent 25 minutes counseling the patient face to face. The total time spent in the appointment was 30 minutes.    Farrel Gobble  A, MD 12/02/2014 10:46 AM  DISCLAIMER:  This note was dictated with voice recognition software.  Similar sounding words can inadvertently be transcribed inaccurately and may not be corrected upon review.

## 2014-12-02 NOTE — Patient Instructions (Signed)
[facility] Discharge Instructions  RECOMMENDATIONS MADE BY THE CONSULTANT AND ANY TEST RESULTS WILL BE SENT TO YOUR REFERRING PHYSICIAN.  EXAM FINDINGS BY THE PHYSICIAN TODAY AND SIGNS OR SYMPTOMS TO REPORT TO CLINIC OR PRIMARY PHYSICIAN: Exam and findings as discussed by Dr. Barnet Glasgow.  Report any unusual bruising or bleeding or other concerns.  MEDICATIONS PRESCRIBED:  Continue hydrea  INSTRUCTIONS/FOLLOW-UP: Labs and office visit in 9 months.  Thank you for choosing [facility] to provide your oncology and hematology care.  To afford each patient quality time with our providers, please arrive at least 15 minutes before your scheduled appointment time.  With your help, our goal is to use those 15 minutes to complete the necessary work-up to ensure our physicians have the information they need to help with your evaluation and healthcare recommendations.    Effective January 1st, 2014, we ask that you re-schedule your appointment with our physicians should you arrive 10 or more minutes late for your appointment.  We strive to give you quality time with our providers, and arriving late affects you and other patients whose appointments are after yours.    Again, thank you for choosing [facility].  Our hope is that these requests will decrease the amount of time that you wait before being seen by our physicians.       _____________________________________________________________  Should you have questions after your visit to [facility], please contact our office at [phone] between the hours of 8:30 a.m. and 4:30 p.m.  Voicemails left after 4:30 p.m. will not be returned until the following business day.  For prescription refill requests, have your pharmacy contact our office with your prescription refill request.    _______________________________________________________________  We hope that we have given you very good care.   You may receive a patient satisfaction survey in the mail, please complete it and return it as soon as possible.  We value your feedback!  _______________________________________________________________  Have you asked about our STAR program?  STAR stands for Survivorship Training and Rehabilitation, and this is a nationally recognized cancer care program that focuses on survivorship and rehabilitation.  Cancer and cancer treatments may cause problems, such as, pain, making you feel tired and keeping you from doing the things that you need or want to do. Cancer rehabilitation can help. Our goal is to reduce these troubling effects and help you have the best quality of life possible.  You may receive a survey from a nurse that asks questions about your current state of health.  Based on the survey results, all eligible patients will be referred to the [facility] program for an evaluation so we can better serve you!  A frequently asked questions sheet is available upon request.

## 2015-03-09 ENCOUNTER — Encounter: Payer: Medicare Other | Admitting: Obstetrics & Gynecology

## 2015-03-16 ENCOUNTER — Ambulatory Visit (INDEPENDENT_AMBULATORY_CARE_PROVIDER_SITE_OTHER): Payer: Medicare Other | Admitting: Obstetrics & Gynecology

## 2015-03-16 ENCOUNTER — Encounter: Payer: Self-pay | Admitting: Obstetrics & Gynecology

## 2015-03-16 VITALS — BP 140/80 | HR 76 | Wt 125.0 lb

## 2015-03-16 DIAGNOSIS — N813 Complete uterovaginal prolapse: Secondary | ICD-10-CM

## 2015-03-16 NOTE — Addendum Note (Signed)
Addended by: Florian Buff on: 03/16/2015 02:21 PM   Modules accepted: Level of Service

## 2015-03-16 NOTE — Progress Notes (Signed)
Patient ID: [name], female   DOB: 01/29/1936, 79 y.o.   MRN: [id number]      [name] presents today for routine follow up related to her pessary.   She uses a Milex ring with support #4 She reports no vaginal discharge or vaginal bleeding.  Exam reveals no undue vaginal mucosal pressure of breakdown, no discharge and no vaginal bleeding.  The pessary is removed, cleaned and replaced without difficulty.    [name] will be sen back in 3 months for continued follow up.  EURE,LUTHER H 03/16/2015 2:06 PM

## 2015-04-03 ENCOUNTER — Other Ambulatory Visit (HOSPITAL_COMMUNITY): Payer: Self-pay | Admitting: Oncology

## 2015-07-06 DIAGNOSIS — Z1389 Encounter for screening for other disorder: Secondary | ICD-10-CM | POA: Diagnosis not present

## 2015-07-06 DIAGNOSIS — Z0001 Encounter for general adult medical examination with abnormal findings: Secondary | ICD-10-CM | POA: Diagnosis not present

## 2015-07-06 DIAGNOSIS — D45 Polycythemia vera: Secondary | ICD-10-CM | POA: Diagnosis not present

## 2015-07-06 DIAGNOSIS — Z6820 Body mass index (BMI) 20.0-20.9, adult: Secondary | ICD-10-CM | POA: Diagnosis not present

## 2015-07-06 DIAGNOSIS — S29012A Strain of muscle and tendon of back wall of thorax, initial encounter: Secondary | ICD-10-CM | POA: Diagnosis not present

## 2015-07-26 ENCOUNTER — Ambulatory Visit: Payer: Medicare Other | Admitting: Obstetrics & Gynecology

## 2015-08-07 ENCOUNTER — Encounter: Payer: Self-pay | Admitting: Obstetrics & Gynecology

## 2015-08-07 ENCOUNTER — Ambulatory Visit (INDEPENDENT_AMBULATORY_CARE_PROVIDER_SITE_OTHER): Payer: Medicare Other | Admitting: Obstetrics & Gynecology

## 2015-08-07 VITALS — BP 120/60 | HR 72 | Wt 124.0 lb

## 2015-08-07 DIAGNOSIS — N813 Complete uterovaginal prolapse: Secondary | ICD-10-CM | POA: Diagnosis not present

## 2015-08-07 NOTE — Progress Notes (Signed)
Patient ID: [name], female   DOB: 02-04-1936, 79 y.o.   MRN: [id number] Chief Complaint  Patient presents with  . Follow-up    check/ clean pessary.    Blood pressure 120/60, pulse 72, weight 124 lb (56.246 kg).  [name] presents today for routine follow up related to her pessary.   She uses a milex ring with support #3 She reports little vaginal discharge or vaginal bleeding.  Exam reveals no undue vaginal mucosal pressure of breakdown, little discharge and little vaginal bleeding.  The pessary is removed, cleaned and replaced without difficulty.    [name] will be sen back in 3 months for continued follow up.  @[facility] @ 08/07/2015 3:21 PM

## 2015-08-16 ENCOUNTER — Other Ambulatory Visit (HOSPITAL_COMMUNITY): Payer: Self-pay | Admitting: Family Medicine

## 2015-08-16 DIAGNOSIS — Z1231 Encounter for screening mammogram for malignant neoplasm of breast: Secondary | ICD-10-CM

## 2015-08-17 ENCOUNTER — Other Ambulatory Visit (HOSPITAL_COMMUNITY): Payer: Self-pay

## 2015-08-17 DIAGNOSIS — D45 Polycythemia vera: Secondary | ICD-10-CM

## 2015-08-22 ENCOUNTER — Other Ambulatory Visit (HOSPITAL_COMMUNITY): Payer: Self-pay | Admitting: Oncology

## 2015-09-04 ENCOUNTER — Encounter (HOSPITAL_COMMUNITY): Payer: Self-pay | Admitting: Hematology & Oncology

## 2015-09-04 ENCOUNTER — Encounter (HOSPITAL_BASED_OUTPATIENT_CLINIC_OR_DEPARTMENT_OTHER): Payer: Medicaid Other

## 2015-09-04 ENCOUNTER — Encounter (HOSPITAL_COMMUNITY): Payer: Medicaid Other | Attending: Hematology & Oncology | Admitting: Hematology & Oncology

## 2015-09-04 VITALS — BP 133/62 | HR 60 | Temp 98.5°F | Resp 16 | Wt 125.0 lb

## 2015-09-04 DIAGNOSIS — D45 Polycythemia vera: Secondary | ICD-10-CM

## 2015-09-04 LAB — CBC WITH DIFFERENTIAL/PLATELET
Basophils Absolute: 0.1 10*3/uL (ref 0.0–0.1)
Basophils Relative: 2 %
EOS ABS: 0.2 10*3/uL (ref 0.0–0.7)
Eosinophils Relative: 3 %
HCT: 39.8 % (ref 36.0–46.0)
Hemoglobin: 13.1 g/dL (ref 12.0–15.0)
Lymphocytes Relative: 19 %
Lymphs Abs: 1.1 10*3/uL (ref 0.7–4.0)
MCH: 32.8 pg (ref 26.0–34.0)
MCHC: 32.9 g/dL (ref 30.0–36.0)
MCV: 99.5 fL (ref 78.0–100.0)
Monocytes Absolute: 0.5 10*3/uL (ref 0.1–1.0)
Monocytes Relative: 8 %
Neutro Abs: 4.1 10*3/uL (ref 1.7–7.7)
Neutrophils Relative %: 68 %
PLATELETS: 383 10*3/uL (ref 150–400)
RBC: 4 MIL/uL (ref 3.87–5.11)
RDW: 14.5 % (ref 11.5–15.5)
WBC: 6 10*3/uL (ref 4.0–10.5)

## 2015-09-04 LAB — COMPREHENSIVE METABOLIC PANEL
ALT: 18 U/L (ref 14–54)
AST: 24 U/L (ref 15–41)
Albumin: 4.1 g/dL (ref 3.5–5.0)
Alkaline Phosphatase: 87 U/L (ref 38–126)
Anion gap: 4 — ABNORMAL LOW (ref 5–15)
BUN: 16 mg/dL (ref 6–20)
CHLORIDE: 104 mmol/L (ref 101–111)
CO2: 29 mmol/L (ref 22–32)
Calcium: 8.5 mg/dL — ABNORMAL LOW (ref 8.9–10.3)
Creatinine, Ser: 0.65 mg/dL (ref 0.44–1.00)
GFR calc non Af Amer: >60 mL/min (ref >60)
Glucose, Bld: 98 mg/dL (ref 65–99)
POTASSIUM: 3.7 mmol/L (ref 3.5–5.1)
SODIUM: 137 mmol/L (ref 135–145)
Total Bilirubin: 0.7 mg/dL (ref 0.3–1.2)
Total Protein: 7.4 g/dL (ref 6.5–8.1)

## 2015-09-04 LAB — LACTATE DEHYDROGENASE: LDH: 151 U/L (ref 98–192)

## 2015-09-04 MED ORDER — HYDROXYUREA 500 MG PO CAPS: ORAL_CAPSULE | ORAL | Status: DC

## 2015-09-04 NOTE — Progress Notes (Signed)
Labs drawn

## 2015-09-04 NOTE — Patient Instructions (Signed)
[place] at [facility] Discharge Instructions  RECOMMENDATIONS MADE BY THE CONSULTANT AND ANY TEST RESULTS WILL BE SENT TO YOUR REFERRING PHYSICIAN.  Exam and discussion today with Dr. Whitney Muse. Lab work today. Return in 6 months for lab work and office visit with Caroleen Hamman, PA.   Thank you for choosing [facility] at [facility] to provide your oncology and hematology care.  To afford each patient quality time with our provider, please arrive at least 15 minutes before your scheduled appointment time.    You need to re-schedule your appointment should you arrive 10 or more minutes late.  We strive to give you quality time with our providers, and arriving late affects you and other patients whose appointments are after yours.  Also, if you no show three or more times for appointments you may be dismissed from the clinic at the providers discretion.     Again, thank you for choosing [facility].  Our hope is that these requests will decrease the amount of time that you wait before being seen by our physicians.       _____________________________________________________________  Should you have questions after your visit to [facility], please contact our office at [phone] between the hours of 8:30 a.m. and 4:30 p.m.  Voicemails left after 4:30 p.m. will not be returned until the following business day.  For prescription refill requests, have your pharmacy contact our office.

## 2015-09-04 NOTE — Progress Notes (Signed)
Briana Kilts, MD [address] [place] [address]  No diagnosis found.  CURRENT THERAPY: Hydrea 500 mg 3 times per week.  INTERVAL HISTORY: [name] 79 y.o. female returns for  regular  visit for followup of polycythemia vera diagnosed August 2005 on Hydrea 500 mg 3 times a week with excellent control.  I personally reviewed and went over laboratory results with the patient.   Her counts are stable and Hydrea 500 mg 3 times per weeks is keep her counts perfectly controlled.  She denies headaches, blurry vision, problems eating or sleeping.  She is up to date on her screenings.     Past Medical History  Diagnosis Date  . Hypertension   . Osteoporosis   . Prolapse, uterus, congenital     ring placement  . Polycythemia   . Polycythemia vera(238.4) 02/25/2013    Diagnosed August 2005 on Hydrea 500 mg 3 times a week with excellent control.   . Influenza 11/2012    has Polycythemia vera; Vaginitis and vulvovaginitis, unspecified; and Uterovaginal prolapse, complete on her problem list.     is allergic to codeine; diovan; penicillins; and ramipril.  [name] had no medications administered during this visit.  Past Surgical History  Procedure Laterality Date  . Opperectomy    . Oophorectomy      left  . Colonoscopy    . Appendectomy      Denies any headaches, dizziness, double vision, fevers, chills, night sweats, nausea, vomiting, diarrhea, constipation, chest pain, heart palpitations, shortness of breath, blood in stool, black tarry stool, urinary pain, urinary burning, urinary frequency, hematuria. 14 point review of systems was performed and is negative except as detailed under history of present illness and above    PHYSICAL EXAMINATION  ECOG PERFORMANCE STATUS: 0 - Asymptomatic  Filed Vitals:   09/04/15 1049  BP: 133/62  Pulse: 60  Temp: 98.5 F (36.9 C)  Resp: 16    GENERAL:alert, healthy, no distress, well nourished, well  developed, comfortable, cooperative and smiling SKIN: skin color, texture, turgor are normal, no rashes or significant lesions HEAD: Normocephalic, No masses, lesions, tenderness or abnormalities EYES: normal, Conjunctiva are pink and non-injected EARS: External ears normal OROPHARYNX:mucous membranes are moist  NECK: supple, no adenopathy, thyroid normal size, non-tender, without nodularity, no stridor, non-tender, trachea midline LYMPH:  no palpable lymphadenopathy, no hepatosplenomegaly BREAST:not examined LUNGS: clear to auscultation and percussion HEART: regular rate & rhythm, no murmurs, no gallops, S1 normal and S2 normal ABDOMEN:abdomen soft, non-tender, normal bowel sounds, no masses or organomegaly and no hepatosplenomegaly BACK: Back symmetric, no curvature., No CVA tenderness EXTREMITIES:less then 2 second capillary refill, no joint deformities, effusion, or inflammation, no edema, no skin discoloration, no clubbing, no cyanosis  NEURO: alert & oriented x 3 with fluent speech, no focal motor/sensory deficits, gait normal    LABORATORY DATA: I have reviewed the data as listed.  CBC    Component Value Date/Time   WBC 6.0 09/04/2015 1028   RBC 4.00 09/04/2015 1028   HGB 13.1 09/04/2015 1028   HCT 39.8 09/04/2015 1028   PLT 383 09/04/2015 1028   MCV 99.5 09/04/2015 1028   MCH 32.8 09/04/2015 1028   MCHC 32.9 09/04/2015 1028   RDW 14.5 09/04/2015 1028   LYMPHSABS 1.1 09/04/2015 1028   MONOABS 0.5 09/04/2015 1028   EOSABS 0.2 09/04/2015 1028   BASOSABS 0.1 09/04/2015 1028    RADIOGRAPHIC STUDIES:  08/14/2013  *RADIOLOGY REPORT*  Clinical Data:  Screening.  DIGITAL BILATERAL SCREENING MAMMOGRAM WITH CAD  Comparison: Previous exams.  Findings: The breast tissue is heterogeneously dense. No  suspicious masses, architectural distortion, or calcifications are  present.  Images were processed with CAD.  IMPRESSION:  No mammographic evidence of malignancy.  A result  letter of this screening mammogram will be mailed directly  to the patient.  RECOMMENDATION:  Screening mammogram in one year. (Code:SM-B-01Y)  BI-RADS CATEGORY 1: Negative.  Original Report Authenticated By: Arline Asp, M.D.    ASSESSMENT:  1. Polycythemia vera diagnosed August 2005 on Hydrea 500 mg 3 times a week with excellent control.  PLAN:  1. I personally reviewed and went over laboratory results with the patient. 2. I personally reviewed and went over radiographic studies with the patient. 3. Continue same dose of Hydrea 500 mg three times per week.  4. Return in 6 months for follow-up.  All questions were answered. The patient knows to call the clinic with any problems, questions or concerns. We can certainly see the patient much sooner if necessary.   New hydrea prescription was provided to the patient. Do not have documentation of JAK 2 testing. We will have to pull her paper chart to see if done and if not available will add to her next f/u. She feels comfortable with visits every 6 months. She has been stable on her current dose for some time.  This document serves as a record of services personally performed by Ancil Linsey, MD. It was created on her behalf by Janace Hoard, a trained medical scribe. The creation of this record is based on the scribe's personal observations and the provider's statements to them. This document has been checked and approved by the attending provider.  I have reviewed the above documentation for accuracy and completeness, and I agree with the above.  This note was electronically signed.  Kelby Fam. Whitney Muse, MD

## 2015-09-20 ENCOUNTER — Ambulatory Visit (HOSPITAL_COMMUNITY): Payer: Medicaid Other

## 2015-09-27 ENCOUNTER — Ambulatory Visit (HOSPITAL_COMMUNITY)
Admission: RE | Admit: 2015-09-27 | Discharge: 2015-09-27 | Disposition: A | Discharge status: Home / Self Care | Payer: Medicare Other | Source: Ambulatory Visit | Attending: Family Medicine | Admitting: Family Medicine

## 2015-09-27 DIAGNOSIS — Z1231 Encounter for screening mammogram for malignant neoplasm of breast: Secondary | ICD-10-CM

## 2015-12-07 ENCOUNTER — Ambulatory Visit: Payer: Medicare Other | Admitting: Obstetrics & Gynecology

## 2015-12-21 DIAGNOSIS — H25813 Combined forms of age-related cataract, bilateral: Secondary | ICD-10-CM | POA: Diagnosis not present

## 2015-12-21 DIAGNOSIS — H5213 Myopia, bilateral: Secondary | ICD-10-CM | POA: Diagnosis not present

## 2015-12-21 DIAGNOSIS — H524 Presbyopia: Secondary | ICD-10-CM | POA: Diagnosis not present

## 2015-12-21 DIAGNOSIS — H52221 Regular astigmatism, right eye: Secondary | ICD-10-CM | POA: Diagnosis not present

## 2015-12-22 ENCOUNTER — Ambulatory Visit: Payer: Medicare Other | Admitting: Obstetrics & Gynecology

## 2015-12-28 ENCOUNTER — Encounter: Payer: Self-pay | Admitting: Obstetrics & Gynecology

## 2015-12-28 ENCOUNTER — Ambulatory Visit (INDEPENDENT_AMBULATORY_CARE_PROVIDER_SITE_OTHER): Payer: Medicare Other | Admitting: Obstetrics & Gynecology

## 2015-12-28 VITALS — BP 136/70 | Ht 64.5 in | Wt 129.5 lb

## 2015-12-28 DIAGNOSIS — N813 Complete uterovaginal prolapse: Secondary | ICD-10-CM | POA: Diagnosis not present

## 2015-12-28 NOTE — Progress Notes (Signed)
Patient ID: [name], female   DOB: 1936-01-22, 80 y.o.   MRN: [id number] Chief Complaint  Patient presents with  . gyn visit    clean/ check pessary    Blood pressure 136/70, height 5' 4.5" (1.638 m), weight 129 lb 8 oz (58.741 kg).  [name] presents today for routine follow up related to her pessary.   She uses a milex ring with support #2 She reports little vaginal discharge or vaginal bleeding.  Exam reveals no undue vaginal mucosal pressure of breakdown, little discharge and little vaginal bleeding.  The pessary is removed, cleaned and replaced without difficulty.    [name] will be sen back in 4 months for continued follow up.  Florian Buff, MD  12/28/2015 3:17 PM

## 2016-02-12 DIAGNOSIS — Z1389 Encounter for screening for other disorder: Secondary | ICD-10-CM | POA: Diagnosis not present

## 2016-02-12 DIAGNOSIS — J209 Acute bronchitis, unspecified: Secondary | ICD-10-CM | POA: Diagnosis not present

## 2016-02-12 DIAGNOSIS — M810 Age-related osteoporosis without current pathological fracture: Secondary | ICD-10-CM | POA: Diagnosis not present

## 2016-02-12 DIAGNOSIS — Z6821 Body mass index (BMI) 21.0-21.9, adult: Secondary | ICD-10-CM | POA: Diagnosis not present

## 2016-02-12 DIAGNOSIS — J0140 Acute pansinusitis, unspecified: Secondary | ICD-10-CM | POA: Diagnosis not present

## 2016-02-12 DIAGNOSIS — I10 Essential (primary) hypertension: Secondary | ICD-10-CM | POA: Diagnosis not present

## 2016-02-12 DIAGNOSIS — D45 Polycythemia vera: Secondary | ICD-10-CM | POA: Diagnosis not present

## 2016-02-12 DIAGNOSIS — M1991 Primary osteoarthritis, unspecified site: Secondary | ICD-10-CM | POA: Diagnosis not present

## 2016-03-01 ENCOUNTER — Other Ambulatory Visit (HOSPITAL_COMMUNITY): Payer: Self-pay | Admitting: Hematology & Oncology

## 2016-03-04 ENCOUNTER — Ambulatory Visit (HOSPITAL_COMMUNITY): Payer: Medicaid Other | Admitting: Oncology

## 2016-03-04 ENCOUNTER — Other Ambulatory Visit (HOSPITAL_COMMUNITY): Payer: Medicaid Other

## 2016-03-05 NOTE — Progress Notes (Signed)
[name], MD [place] [id number]  Polycythemia vera [facility]) - Plan: CBC with Differential, Comprehensive metabolic panel, Lactate dehydrogenase, JAK2 V617F, Rfx CALR/E12/MPL, CBC with Differential, Comprehensive metabolic panel, Lactate dehydrogenase, hydroxyurea (HYDREA) 500 MG capsule  Hypokalemia - Plan: potassium chloride SA (K-DUR,KLOR-CON) 20 MEQ tablet  CURRENT THERAPY: Hydrea 500 mg 3 times per week.  INTERVAL HISTORY: [name] 80 y.o. female returns for followup of polycythemia vera diagnosed August 2005 on Hydrea 500 mg 3 times a week with excellent control.  I personally reviewed and went over laboratory results with the patient.  The results are noted within this dictation.  Labs will be updated today.  HCT is less than 45%.  She has not needed a therapeutic phlebotomy in years.  Platelet count is 403,000 and white blood cell count and differential is within normal limits. No dosing adjustments required for Hydrea at this time. Hypokalemia is noted and will be addressed.  I personally reviewed and went over radiographic studies with the patient.  The results are noted within this dictation.  Mammogram on 09/29/2015 was BIRADS 1.    She recently had a respiratory tract infection and this was treated with an antibiotic, Levaquin. She finished that approximately 2 weeks ago. She is back to baseline.  She denies any hematologic complaints.  Past Medical History  Diagnosis Date  . Hypertension   . Osteoporosis   . Prolapse, uterus, congenital     ring placement  . Polycythemia   . Polycythemia vera(238.4) 02/25/2013    Diagnosed August 2005 on Hydrea 500 mg 3 times a week with excellent control.   . Influenza 11/2012    has Polycythemia vera ([place]); Vaginitis and vulvovaginitis, unspecified; and Uterovaginal prolapse, complete on her problem list.     is allergic to codeine; diovan; penicillins; and ramipril.  Current  Outpatient Prescriptions on File Prior to Visit  Medication Sig Dispense Refill  . amLODipine (NORVASC) 10 MG tablet Take 10 mg by mouth daily.    Marland Kitchen aspirin 81 MG tablet Take 81 mg by mouth daily.      . Calcium Carbonate-Vitamin D (CALCIUM + D PO) Take 2,000 mg by mouth 2 (two) times daily.       No current facility-administered medications on file prior to visit.    Past Surgical History  Procedure Laterality Date  . Opperectomy    . Oophorectomy      left  . Colonoscopy    . Appendectomy      Denies any headaches, dizziness, double vision, fevers, chills, night sweats, nausea, vomiting, diarrhea, constipation, chest pain, heart palpitations, shortness of breath, blood in stool, black tarry stool, urinary pain, urinary burning, urinary frequency, hematuria.   PHYSICAL EXAMINATION  ECOG PERFORMANCE STATUS: 0 - Asymptomatic  Filed Vitals:   03/06/16 1009  BP: 156/59  Pulse: 61  Temp: 98 F (36.7 C)  Resp: 18    GENERAL:alert, no distress, well nourished, well developed, comfortable, cooperative, smiling and unaccompanied  SKIN: skin color, texture, turgor are normal, no rashes or significant lesions HEAD: Normocephalic, No masses, lesions, tenderness or abnormalities EYES: normal, EOMI, Conjunctiva are pink and non-injected EARS: External ears normal OROPHARYNX:lips, buccal mucosa, and tongue normal and mucous membranes are moist  NECK: supple, no adenopathy, thyroid normal size, non-tender, without nodularity, trachea midline LYMPH:  no palpable lymphadenopathy, no hepatosplenomegaly BREAST:not examined LUNGS: clear to auscultation and percussion HEART: regular rate & rhythm, no murmurs and  no gallops ABDOMEN:abdomen soft, non-tender and normal bowel sounds BACK: Back symmetric, no curvature. EXTREMITIES:less then 2 second capillary refill, no joint deformities, effusion, or inflammation, no skin discoloration, no cyanosis  NEURO: alert & oriented x 3 with fluent  speech, no focal motor/sensory deficits, gait normal   LABORATORY DATA: CBC    Component Value Date/Time   WBC 6.4 03/06/2016 0925   RBC 4.18 03/06/2016 0925   HGB 13.5 03/06/2016 0925   HCT 41.4 03/06/2016 0925   PLT 403* 03/06/2016 0925   MCV 99.0 03/06/2016 0925   MCH 32.3 03/06/2016 0925   MCHC 32.6 03/06/2016 0925   RDW 15.0 03/06/2016 0925   LYMPHSABS 1.5 03/06/2016 0925   MONOABS 0.5 03/06/2016 0925   EOSABS 0.3 03/06/2016 0925   BASOSABS 0.1 03/06/2016 0925      Chemistry      Component Value Date/Time   NA 140 03/06/2016 0925   K 3.4* 03/06/2016 0925   CL 104 03/06/2016 0925   CO2 30 03/06/2016 0925   BUN 12 03/06/2016 0925   CREATININE 0.56 03/06/2016 0925      Component Value Date/Time   CALCIUM 8.4* 03/06/2016 0925   ALKPHOS 84 03/06/2016 0925   AST 20 03/06/2016 0925   ALT 13* 03/06/2016 0925   BILITOT 0.8 03/06/2016 0925        PENDING LABS:   RADIOGRAPHIC STUDIES:  No results found.   PATHOLOGY:    ASSESSMENT AND PLAN:  Polycythemia vera ([place]) Polycythemia vera diagnosed in August 2005, on Hydrea 500 mg three time per week with excellent control in blood counts.  Labs today: CBC diff, CMET, LDH, JAK2 with reflex.  Some labs are pending today. WBC, hemoglobin, RBC, HCT, and platelet count are stable and within normal limits. She does have a minimal hypokalemia at 3.4. I have escribed 40 mEq of Kdur for 15 days. Once prescription is completed she can follow up with her primary care physician if necessary for further refills.  Hematocrit is less than 45% and there is no indication for therapeutic phlebotomy at this time.  Labs in 6 months: CBC diff, CMET, LDH  Return in 6 months for follow-up.    THERAPY PLAN:  She will continue with Hydrea at current dose (500 mg) on Monday, Wednesday, and Friday.  All questions were answered. The patient knows to call the clinic with any problems, questions or concerns. We can certainly see the  patient much sooner if necessary.  Patient and plan discussed with Dr. Ancil Linsey and she is in agreement with the aforementioned.   This note is electronically signed by: Doy Mince 03/06/2016 12:03 PM

## 2016-03-05 NOTE — Assessment & Plan Note (Addendum)
Polycythemia vera diagnosed in August 2005, on Hydrea 500 mg three time per week with excellent control in blood counts.  Labs today: CBC diff, CMET, LDH, JAK2 with reflex.  Some labs are pending today. WBC, hemoglobin, RBC, HCT, and platelet count are stable and within normal limits. She does have a minimal hypokalemia at 3.4. I have escribed 40 mEq of Kdur for 15 days. Once prescription is completed she can follow up with her primary care physician if necessary for further refills.  Hematocrit is less than 45% and there is no indication for therapeutic phlebotomy at this time.  Labs in 6 months: CBC diff, CMET, LDH  Return in 6 months for follow-up.

## 2016-03-06 ENCOUNTER — Encounter (HOSPITAL_COMMUNITY): Payer: Self-pay | Admitting: Oncology

## 2016-03-06 ENCOUNTER — Encounter (HOSPITAL_COMMUNITY): Payer: Medicare Other

## 2016-03-06 ENCOUNTER — Encounter (HOSPITAL_COMMUNITY): Payer: Medicare Other | Attending: Oncology | Admitting: Oncology

## 2016-03-06 VITALS — BP 156/59 | HR 61 | Temp 98.0°F | Resp 18 | Wt 128.4 lb

## 2016-03-06 DIAGNOSIS — E876 Hypokalemia: Secondary | ICD-10-CM | POA: Diagnosis not present

## 2016-03-06 DIAGNOSIS — Z9889 Other specified postprocedural states: Secondary | ICD-10-CM | POA: Insufficient documentation

## 2016-03-06 DIAGNOSIS — Z7982 Long term (current) use of aspirin: Secondary | ICD-10-CM | POA: Diagnosis not present

## 2016-03-06 DIAGNOSIS — I10 Essential (primary) hypertension: Secondary | ICD-10-CM | POA: Insufficient documentation

## 2016-03-06 DIAGNOSIS — D45 Polycythemia vera: Secondary | ICD-10-CM | POA: Diagnosis not present

## 2016-03-06 DIAGNOSIS — Z79899 Other long term (current) drug therapy: Secondary | ICD-10-CM | POA: Diagnosis not present

## 2016-03-06 LAB — COMPREHENSIVE METABOLIC PANEL
ALBUMIN: 4.1 g/dL (ref 3.5–5.0)
ALK PHOS: 84 U/L (ref 38–126)
ALT: 13 U/L — AB (ref 14–54)
ANION GAP: 6 (ref 5–15)
AST: 20 U/L (ref 15–41)
BUN: 12 mg/dL (ref 6–20)
CHLORIDE: 104 mmol/L (ref 101–111)
CO2: 30 mmol/L (ref 22–32)
Calcium: 8.4 mg/dL — ABNORMAL LOW (ref 8.9–10.3)
Creatinine, Ser: 0.56 mg/dL (ref 0.44–1.00)
GFR calc non Af Amer: >60 mL/min (ref >60)
GLUCOSE: 95 mg/dL (ref 65–99)
Potassium: 3.4 mmol/L — ABNORMAL LOW (ref 3.5–5.1)
SODIUM: 140 mmol/L (ref 135–145)
Total Bilirubin: 0.8 mg/dL (ref 0.3–1.2)
Total Protein: 7.4 g/dL (ref 6.5–8.1)

## 2016-03-06 LAB — CBC WITH DIFFERENTIAL/PLATELET
BASOS PCT: 2 %
Basophils Absolute: 0.1 10*3/uL (ref 0.0–0.1)
EOS ABS: 0.3 10*3/uL (ref 0.0–0.7)
EOS PCT: 4 %
HCT: 41.4 % (ref 36.0–46.0)
HEMOGLOBIN: 13.5 g/dL (ref 12.0–15.0)
LYMPHS ABS: 1.5 10*3/uL (ref 0.7–4.0)
Lymphocytes Relative: 23 %
MCH: 32.3 pg (ref 26.0–34.0)
MCHC: 32.6 g/dL (ref 30.0–36.0)
MCV: 99 fL (ref 78.0–100.0)
Monocytes Absolute: 0.5 10*3/uL (ref 0.1–1.0)
Monocytes Relative: 8 %
NEUTROS PCT: 63 %
Neutro Abs: 4 10*3/uL (ref 1.7–7.7)
PLATELETS: 403 10*3/uL — AB (ref 150–400)
RBC: 4.18 MIL/uL (ref 3.87–5.11)
RDW: 15 % (ref 11.5–15.5)
WBC: 6.4 10*3/uL (ref 4.0–10.5)

## 2016-03-06 LAB — LACTATE DEHYDROGENASE: LDH: 147 U/L (ref 98–192)

## 2016-03-06 MED ORDER — POTASSIUM CHLORIDE CRYS ER 20 MEQ PO TBCR: 20.0000 meq | EXTENDED_RELEASE_TABLET | Freq: Two times a day (BID) | ORAL | Status: DC

## 2016-03-06 MED ORDER — HYDROXYUREA 500 MG PO CAPS: ORAL_CAPSULE | ORAL | Status: DC

## 2016-03-06 NOTE — Patient Instructions (Signed)
[place] at [facility] Discharge Instructions  RECOMMENDATIONS MADE BY THE CONSULTANT AND ANY TEST RESULTS WILL BE SENT TO YOUR REFERRING PHYSICIAN.  Refill on hydrea escribed to drug store Kdur 20 meq twice a day for 15 days Labs in 6 months  See Tom in 6 months  Thank you for choosing [place] at [facility] to provide your oncology and hematology care.  To afford each patient quality time with our provider, please arrive at least 15 minutes before your scheduled appointment time.   Beginning January 23rd 2017 lab work for the [facility] will be done in the  Main lab at [facility] on 1st floor. If you have a lab appointment with the [facility] please come in thru the  Main Entrance and check in at the main information desk  You need to re-schedule your appointment should you arrive 10 or more minutes late.  We strive to give you quality time with our providers, and arriving late affects you and other patients whose appointments are after yours.  Also, if you no show three or more times for appointments you may be dismissed from the clinic at the providers discretion.     Again, thank you for choosing [facility].  Our hope is that these requests will decrease the amount of time that you wait before being seen by our physicians.       _____________________________________________________________  Should you have questions after your visit to [facility], please contact our office at [phone] between the hours of 8:30 a.m. and 4:30 p.m.  Voicemails left after 4:30 p.m. will not be returned until the following business day.  For prescription refill requests, have your pharmacy contact our office.         Resources For Cancer Patients and their Caregivers ? American Cancer Society: Can assist with transportation, wigs, general needs, runs Look Good Feel Better.        951 291 6004 ? Cancer  Care: Provides financial assistance, online support groups, medication/co-pay assistance.  1-800-813-HOPE (419) 141-8788) ? [place] Assists [place] cancer patients and their families through emotional , educational and financial support.  (701)047-9331 ? [place] DSS Where to apply for food stamps, Medicaid and utility assistance. 3176737219 ? [facility]: Transportation to medical appointments. 616-776-0166 ? Social Security Administration: May apply for disability if have a Stage IV cancer. [phone] ? [employer], Disability and Transit Services: Assists with nutrition, care and transit needs. (680)520-7096

## 2016-03-22 LAB — JAK2 V617F, W REFLEX TO CALR/E12/MPL

## 2016-04-05 ENCOUNTER — Other Ambulatory Visit (HOSPITAL_COMMUNITY): Payer: Self-pay | Admitting: Oncology

## 2016-04-24 ENCOUNTER — Ambulatory Visit: Payer: Medicare Other | Admitting: Obstetrics & Gynecology

## 2016-05-21 ENCOUNTER — Ambulatory Visit (INDEPENDENT_AMBULATORY_CARE_PROVIDER_SITE_OTHER): Payer: Medicare Other | Admitting: Obstetrics & Gynecology

## 2016-05-21 ENCOUNTER — Encounter: Payer: Self-pay | Admitting: Obstetrics & Gynecology

## 2016-05-21 VITALS — BP 128/70 | HR 78 | Wt 126.0 lb

## 2016-05-21 DIAGNOSIS — N813 Complete uterovaginal prolapse: Secondary | ICD-10-CM | POA: Diagnosis not present

## 2016-05-21 NOTE — Progress Notes (Signed)
Patient ID: [name], female   DOB: Mar 29, 1936, 80 y.o.   MRN: [id number] Chief Complaint  Patient presents with  . gyn visit    clean/ check pessary    Blood pressure 128/70, pulse 78, weight 126 lb (57.153 kg).  [name] presents today for routine follow up related to her pessary.   She uses a milex ring with support #2 She reports little vaginal discharge or vaginal bleeding.  Exam reveals no undue vaginal mucosal pressure of breakdown, little discharge and little vaginal bleeding.  The pessary is removed, cleaned and replaced without difficulty.    [name] will be sen back in 4 months for continued follow up.  Florian Buff, MD   05/21/2016 2:16 PM

## 2016-06-21 ENCOUNTER — Other Ambulatory Visit (HOSPITAL_COMMUNITY): Payer: Self-pay | Admitting: Oncology

## 2016-09-03 ENCOUNTER — Other Ambulatory Visit (HOSPITAL_COMMUNITY): Payer: Self-pay | Admitting: Family Medicine

## 2016-09-03 DIAGNOSIS — Z1231 Encounter for screening mammogram for malignant neoplasm of breast: Secondary | ICD-10-CM

## 2016-09-06 ENCOUNTER — Ambulatory Visit (HOSPITAL_COMMUNITY): Payer: Medicare Other | Admitting: Oncology

## 2016-09-06 ENCOUNTER — Other Ambulatory Visit (HOSPITAL_COMMUNITY): Payer: Medicare Other

## 2016-09-23 ENCOUNTER — Ambulatory Visit: Payer: Medicare Other | Admitting: Obstetrics & Gynecology

## 2016-09-26 ENCOUNTER — Ambulatory Visit: Payer: Medicare Other | Admitting: Obstetrics & Gynecology

## 2016-09-30 ENCOUNTER — Ambulatory Visit (HOSPITAL_COMMUNITY)
Admission: RE | Admit: 2016-09-30 | Discharge: 2016-09-30 | Disposition: A | Discharge status: Home / Self Care | Payer: Medicare Other | Source: Ambulatory Visit | Attending: Family Medicine | Admitting: Family Medicine

## 2016-09-30 DIAGNOSIS — Z1231 Encounter for screening mammogram for malignant neoplasm of breast: Secondary | ICD-10-CM

## 2016-10-21 ENCOUNTER — Ambulatory Visit (INDEPENDENT_AMBULATORY_CARE_PROVIDER_SITE_OTHER): Payer: Medicare Other | Admitting: Obstetrics & Gynecology

## 2016-10-21 ENCOUNTER — Encounter (INDEPENDENT_AMBULATORY_CARE_PROVIDER_SITE_OTHER): Payer: Self-pay

## 2016-10-21 ENCOUNTER — Encounter: Payer: Self-pay | Admitting: Obstetrics & Gynecology

## 2016-10-21 VITALS — BP 140/80 | HR 80 | Ht 64.0 in | Wt 131.0 lb

## 2016-10-21 DIAGNOSIS — N813 Complete uterovaginal prolapse: Secondary | ICD-10-CM | POA: Diagnosis not present

## 2016-10-21 NOTE — Progress Notes (Signed)
Patient ID: [name], female   DOB: December 05, 1936, 80 y.o.   MRN: [id number] Chief Complaint  Patient presents with  . Pessary Check    clean    Blood pressure 140/80, pulse 80, height 5\' 4"  (1.626 m), weight 131 lb (59.4 kg).  [name] presents today for routine follow up related to her pessary.   She uses a milex ring with support #2 She reports little vaginal discharge or vaginal bleeding.  Exam reveals no undue vaginal mucosal pressure of breakdown, little discharge and little vaginal bleeding.  The pessary is removed, cleaned and replaced without difficulty.    [name] will be sen back in 4 months for continued follow up.  Florian Buff, MD   10/21/2016 3:49 PM

## 2016-11-14 DIAGNOSIS — Z0000 Encounter for general adult medical examination without abnormal findings: Secondary | ICD-10-CM | POA: Diagnosis not present

## 2016-11-14 DIAGNOSIS — M1991 Primary osteoarthritis, unspecified site: Secondary | ICD-10-CM | POA: Diagnosis not present

## 2016-11-14 DIAGNOSIS — Z1389 Encounter for screening for other disorder: Secondary | ICD-10-CM | POA: Diagnosis not present

## 2016-11-14 DIAGNOSIS — I10 Essential (primary) hypertension: Secondary | ICD-10-CM | POA: Diagnosis not present

## 2016-11-14 DIAGNOSIS — Z6821 Body mass index (BMI) 21.0-21.9, adult: Secondary | ICD-10-CM | POA: Diagnosis not present

## 2016-11-15 DIAGNOSIS — Z0000 Encounter for general adult medical examination without abnormal findings: Secondary | ICD-10-CM | POA: Diagnosis not present

## 2016-11-15 DIAGNOSIS — E785 Hyperlipidemia, unspecified: Secondary | ICD-10-CM | POA: Diagnosis not present

## 2016-11-15 DIAGNOSIS — E039 Hypothyroidism, unspecified: Secondary | ICD-10-CM | POA: Diagnosis not present

## 2016-11-15 DIAGNOSIS — I10 Essential (primary) hypertension: Secondary | ICD-10-CM | POA: Diagnosis not present

## 2016-12-23 DIAGNOSIS — H25813 Combined forms of age-related cataract, bilateral: Secondary | ICD-10-CM | POA: Diagnosis not present

## 2016-12-27 DIAGNOSIS — I10 Essential (primary) hypertension: Secondary | ICD-10-CM | POA: Diagnosis not present

## 2016-12-27 DIAGNOSIS — M810 Age-related osteoporosis without current pathological fracture: Secondary | ICD-10-CM | POA: Diagnosis not present

## 2016-12-27 DIAGNOSIS — Z1389 Encounter for screening for other disorder: Secondary | ICD-10-CM | POA: Diagnosis not present

## 2016-12-27 DIAGNOSIS — J329 Chronic sinusitis, unspecified: Secondary | ICD-10-CM | POA: Diagnosis not present

## 2016-12-27 DIAGNOSIS — Z6821 Body mass index (BMI) 21.0-21.9, adult: Secondary | ICD-10-CM | POA: Diagnosis not present

## 2016-12-27 DIAGNOSIS — M1991 Primary osteoarthritis, unspecified site: Secondary | ICD-10-CM | POA: Diagnosis not present

## 2016-12-27 DIAGNOSIS — D45 Polycythemia vera: Secondary | ICD-10-CM | POA: Diagnosis not present

## 2017-01-06 ENCOUNTER — Encounter (HOSPITAL_COMMUNITY): Payer: Self-pay | Admitting: Oncology

## 2017-01-06 ENCOUNTER — Encounter (HOSPITAL_COMMUNITY): Payer: Medicare Other

## 2017-01-06 ENCOUNTER — Encounter (HOSPITAL_COMMUNITY): Payer: Medicare Other | Attending: Oncology | Admitting: Oncology

## 2017-01-06 DIAGNOSIS — Z9889 Other specified postprocedural states: Secondary | ICD-10-CM | POA: Diagnosis not present

## 2017-01-06 DIAGNOSIS — Z800 Family history of malignant neoplasm of digestive organs: Secondary | ICD-10-CM | POA: Insufficient documentation

## 2017-01-06 DIAGNOSIS — D45 Polycythemia vera: Secondary | ICD-10-CM

## 2017-01-06 DIAGNOSIS — I10 Essential (primary) hypertension: Secondary | ICD-10-CM | POA: Insufficient documentation

## 2017-01-06 DIAGNOSIS — M109 Gout, unspecified: Secondary | ICD-10-CM | POA: Insufficient documentation

## 2017-01-06 DIAGNOSIS — R0602 Shortness of breath: Secondary | ICD-10-CM | POA: Insufficient documentation

## 2017-01-06 DIAGNOSIS — I7381 Erythromelalgia: Secondary | ICD-10-CM | POA: Insufficient documentation

## 2017-01-06 DIAGNOSIS — R05 Cough: Secondary | ICD-10-CM | POA: Insufficient documentation

## 2017-01-06 DIAGNOSIS — Z79899 Other long term (current) drug therapy: Secondary | ICD-10-CM | POA: Diagnosis not present

## 2017-01-06 DIAGNOSIS — Z8042 Family history of malignant neoplasm of prostate: Secondary | ICD-10-CM | POA: Diagnosis not present

## 2017-01-06 LAB — CBC WITH DIFFERENTIAL/PLATELET
Basophils Absolute: 0.1 K/uL (ref 0.0–0.1)
Basophils Relative: 2 %
Eosinophils Absolute: 0.3 K/uL (ref 0.0–0.7)
Eosinophils Relative: 4 %
HCT: 41.4 % (ref 36.0–46.0)
Hemoglobin: 13.6 g/dL (ref 12.0–15.0)
Lymphocytes Relative: 19 %
Lymphs Abs: 1.5 K/uL (ref 0.7–4.0)
MCH: 32.2 pg (ref 26.0–34.0)
MCHC: 32.9 g/dL (ref 30.0–36.0)
MCV: 98.1 fL (ref 78.0–100.0)
Monocytes Absolute: 0.7 K/uL (ref 0.1–1.0)
Monocytes Relative: 9 %
Neutro Abs: 5.4 K/uL (ref 1.7–7.7)
Neutrophils Relative %: 66 %
Platelets: 465 K/uL — ABNORMAL HIGH (ref 150–400)
RBC: 4.22 MIL/uL (ref 3.87–5.11)
RDW: 15.3 % (ref 11.5–15.5)
WBC: 8 K/uL (ref 4.0–10.5)

## 2017-01-06 LAB — COMPREHENSIVE METABOLIC PANEL WITH GFR
ALT: 11 U/L — ABNORMAL LOW (ref 14–54)
AST: 19 U/L (ref 15–41)
Albumin: 3.9 g/dL (ref 3.5–5.0)
Alkaline Phosphatase: 83 U/L (ref 38–126)
Anion gap: 6 (ref 5–15)
BUN: 14 mg/dL (ref 6–20)
CO2: 29 mmol/L (ref 22–32)
Calcium: 8.7 mg/dL — ABNORMAL LOW (ref 8.9–10.3)
Chloride: 104 mmol/L (ref 101–111)
Creatinine, Ser: 0.66 mg/dL (ref 0.44–1.00)
GFR calc Af Amer: >60 mL/min
GFR calc non Af Amer: >60 mL/min
Glucose, Bld: 101 mg/dL — ABNORMAL HIGH (ref 65–99)
Potassium: 3.6 mmol/L (ref 3.5–5.1)
Sodium: 139 mmol/L (ref 135–145)
Total Bilirubin: 0.6 mg/dL (ref 0.3–1.2)
Total Protein: 7.2 g/dL (ref 6.5–8.1)

## 2017-01-06 LAB — LACTATE DEHYDROGENASE: LDH: 138 U/L (ref 98–192)

## 2017-01-06 NOTE — Progress Notes (Signed)
[name], MD [place] [id number]  Polycythemia vera [facility])  CURRENT THERAPY: Hydrea 500 mg 3 times per week.  INTERVAL HISTORY: [name] 81 y.o. female returns for followup of polycythemia vera diagnosed August 2005 on Hydrea 500 mg 3 times a week with excellent control.  She is doing well.  She denies any new complaints.  She is compliant with her Hydrea.  She reports a good appetite and denies any new abdominal pains/discomfort.  She denies any cardiovascular complaints.   She denies: Hyperviscosity: CP Abd pain Myalgia Weakness Fatigue Headache blurred vision transient loss of vision Paresthesias slow mentation and/or a sense of depersonalization  Underlying pulmonary disease: shortness of breath dyspnea on exertion chronic cough history of cyanosis Hypersomnolence with unintentional sleep  Polycythemia Vera: post-bath pruritus Erythromelalgia Gout Arterial or venous thromboses Hemorrhage Early satiety due to the presence of splenomegaly.  Medications: Androgens (testosterone) replacement Anabolic steroids Performance enhancing medication, (ESA, RBC transfusions)   Review of Systems  Constitutional: Negative.  Negative for chills, fever, malaise/fatigue and weight loss.  HENT: Negative.   Eyes: Negative.   Respiratory: Negative.  Negative for cough.   Cardiovascular: Negative.  Negative for chest pain.  Gastrointestinal: Negative.  Negative for abdominal pain, constipation, diarrhea, nausea and vomiting.  Genitourinary: Negative.   Skin: Negative.  Negative for itching and rash.  Neurological: Negative.  Negative for weakness.  Endo/Heme/Allergies: Negative.   Psychiatric/Behavioral: Negative.     Past Medical History:  Diagnosis Date  . Hypertension   . Influenza 11/2012  . Osteoporosis   . Polycythemia   . Polycythemia vera(238.4) 02/25/2013   Diagnosed August 2005 on Hydrea 500 mg 3 times  a week with excellent control.   . Prolapse, uterus, congenital    ring placement    Past Surgical History:  Procedure Laterality Date  . APPENDECTOMY    . COLONOSCOPY    . OOPHORECTOMY     left  . opperectomy      Family History  Problem Relation Age of Onset  . Cancer Brother     throat cancer  . Cancer Brother     prostate cancer  . Cancer Brother     prostate cancer    Social History   Social History  . Marital status: Divorced    Spouse name: N/A  . Number of children: N/A  . Years of education: N/A   Social History Main Topics  . Smoking status: Never Smoker  . Smokeless tobacco: Never Used  . Alcohol use No  . Drug use: No  . Sexual activity: Yes    Birth control/ protection: None   Other Topics Concern  . None   Social History Narrative  . None     PHYSICAL EXAMINATION  ECOG PERFORMANCE STATUS: 0 - Asymptomatic  There were no vitals filed for this visit.  GENERAL:alert, no distress, well nourished, well developed, comfortable, cooperative, smiling and unaccompanied SKIN: skin color, texture, turgor are normal, no rashes or significant lesions HEAD: Normocephalic, No masses, lesions, tenderness or abnormalities EYES: normal, EOMI, Conjunctiva are pink and non-injected EARS: External ears normal OROPHARYNX:lips, buccal mucosa, and tongue normal and mucous membranes are moist  NECK: supple, trachea midline LYMPH:  no palpable lymphadenopathy BREAST:not examined LUNGS: clear to auscultation  HEART: regular rate & rhythm, no murmurs and no gallops ABDOMEN:abdomen soft and normal bowel sounds BACK: Back symmetric, no curvature. EXTREMITIES:less then 2 second capillary refill, no joint deformities,  effusion, or inflammation, no skin discoloration, no cyanosis  NEURO: alert & oriented x 3 with fluent speech, no focal motor/sensory deficits, gait normal   LABORATORY DATA: CBC    Component Value Date/Time   WBC 8.0 01/06/2017 0926   RBC 4.22  01/06/2017 0926   HGB 13.6 01/06/2017 0926   HCT 41.4 01/06/2017 0926   PLT 465 (H) 01/06/2017 0926   MCV 98.1 01/06/2017 0926   MCH 32.2 01/06/2017 0926   MCHC 32.9 01/06/2017 0926   RDW 15.3 01/06/2017 0926   LYMPHSABS 1.5 01/06/2017 0926   MONOABS 0.7 01/06/2017 0926   EOSABS 0.3 01/06/2017 0926   BASOSABS 0.1 01/06/2017 0926      Chemistry      Component Value Date/Time   NA 139 01/06/2017 0926   K 3.6 01/06/2017 0926   CL 104 01/06/2017 0926   CO2 29 01/06/2017 0926   BUN 14 01/06/2017 0926   CREATININE 0.66 01/06/2017 0926      Component Value Date/Time   CALCIUM 8.7 (L) 01/06/2017 0926   ALKPHOS 83 01/06/2017 0926   AST 19 01/06/2017 0926   ALT 11 (L) 01/06/2017 0926   BILITOT 0.6 01/06/2017 0926        PENDING LABS:   RADIOGRAPHIC STUDIES:  No results found.   PATHOLOGY:    ASSESSMENT AND PLAN:  Polycythemia vera ([place]) Polycythemia vera diagnosed in August 2005, on Hydrea 500 mg three time per week with excellent control in blood counts.  Labs today: CBC diff, CMET, LDH.  I personally reviewed and went over laboratory results with the patient.  The results are noted within this dictation.  Labs in 6 months: CBC diff, CMET, LDH  I personally reviewed and went over radiographic studies with the patient.  The results are noted within this dictation.  Next mammogram is due in October 2018.  Return in 6 months for follow-up.   ORDERS PLACED FOR THIS ENCOUNTER: No orders of the defined types were placed in this encounter.   MEDICATIONS PRESCRIBED THIS ENCOUNTER: No orders of the defined types were placed in this encounter.   THERAPY PLAN:  She will continue with Hydrea at current dose (500 mg) on Monday, Wednesday, and Friday.  All questions were answered. The patient knows to call the clinic with any problems, questions or concerns. We can certainly see the patient much sooner if necessary.  Patient and plan discussed with Dr. Ancil Linsey  and she is in agreement with the aforementioned.   This note is electronically signed by: Doy Mince 01/06/2017 10:22 AM

## 2017-01-06 NOTE — Assessment & Plan Note (Addendum)
Polycythemia vera diagnosed in August 2005, on Hydrea 500 mg three time per week with excellent control in blood counts.  Labs today: CBC diff, CMET, LDH.  I personally reviewed and went over laboratory results with the patient.  The results are noted within this dictation.  Labs in 6 months: CBC diff, CMET, LDH  I personally reviewed and went over radiographic studies with the patient.  The results are noted within this dictation.  Next mammogram is due in October 2018.  Return in 6 months for follow-up.

## 2017-01-06 NOTE — Patient Instructions (Signed)
[place] at [facility] Discharge Instructions  RECOMMENDATIONS MADE BY THE CONSULTANT AND ANY TEST RESULTS WILL BE SENT TO YOUR REFERRING PHYSICIAN.  You were seen today by Kirby Crigler PA-C. Continue taking Hydrea. Return in 6 months for labs and follow up.   Thank you for choosing [place] at [facility] to provide your oncology and hematology care.  To afford each patient quality time with our provider, please arrive at least 15 minutes before your scheduled appointment time.    If you have a lab appointment with the [place] [facility] please come in thru the  Main Entrance and check in at the main information desk  You need to re-schedule your appointment should you arrive 10 or more minutes late.  We strive to give you quality time with our providers, and arriving late affects you and other patients whose appointments are after yours.  Also, if you no show three or more times for appointments you may be dismissed from the clinic at the providers discretion.     Again, thank you for choosing [facility].  Our hope is that these requests will decrease the amount of time that you wait before being seen by our physicians.       _____________________________________________________________  Should you have questions after your visit to [facility], please contact our office at [phone] between the hours of 8:30 a.m. and 4:30 p.m.  Voicemails left after 4:30 p.m. will not be returned until the following business day.  For prescription refill requests, have your pharmacy contact our office.       Resources For Cancer Patients and their Caregivers ? American Cancer Society: Can assist with transportation, wigs, general needs, runs Look Good Feel Better.        [phone] ? Cancer Care: Provides financial assistance, online support groups, medication/co-pay assistance.  1-800-813-HOPE (279) 665-6753) ? [place] Assists [place] cancer patients and their families through emotional , educational and financial support.  4093282723 ? [place] DSS Where to apply for food stamps, Medicaid and utility assistance. (202)317-0264 ? RCATS: Transportation to medical appointments. ([phone] 259-5207 ? Social Security Administration: May apply for disability if have a Stage IV cancer. 848-142-3774 [phone] ? [employer], Disability and Transit Services: Assists with nutrition, care and transit needs. [place] Support Programs: @10RELATIVEDAYS @ > Cancer Support Group  2nd Tuesday of the month 1pm-2pm, Journey Room  > Creative Journey  3rd Tuesday of the month 1130am-1pm, Journey Room  > Look Good Feel Better  1st Wednesday of the month 10am-12 noon, Journey Room (Call [place] to register [phone])

## 2017-02-18 ENCOUNTER — Ambulatory Visit: Payer: Medicare Other | Admitting: Obstetrics & Gynecology

## 2017-03-06 ENCOUNTER — Encounter (INDEPENDENT_AMBULATORY_CARE_PROVIDER_SITE_OTHER): Payer: Self-pay

## 2017-03-06 ENCOUNTER — Encounter: Payer: Self-pay | Admitting: Obstetrics & Gynecology

## 2017-03-06 ENCOUNTER — Ambulatory Visit (INDEPENDENT_AMBULATORY_CARE_PROVIDER_SITE_OTHER): Payer: Medicare Other | Admitting: Obstetrics & Gynecology

## 2017-03-06 VITALS — BP 150/80 | HR 72 | Wt 128.0 lb

## 2017-03-06 DIAGNOSIS — Z4689 Encounter for fitting and adjustment of other specified devices: Secondary | ICD-10-CM | POA: Diagnosis not present

## 2017-03-06 DIAGNOSIS — N813 Complete uterovaginal prolapse: Secondary | ICD-10-CM

## 2017-03-09 NOTE — Progress Notes (Signed)
Patient ID: [name], female   DOB: 07/03/1936, 81 y.o.   MRN: [id number] Chief Complaint  Patient presents with  . 4 month follow-up    pessary- check and clean/  room #11    Blood pressure (!) 150/80, pulse 72, weight 128 lb (58.1 kg).  [name] presents today for routine follow up related to her pessary.   She uses a milex ring with support #2 She reports little vaginal discharge or vaginal bleeding.  Exam reveals no undue vaginal mucosal pressure of breakdown, little discharge and little vaginal bleeding.  The pessary is removed, cleaned and replaced without difficulty.    [name] will be sen back in 4 months for continued follow up.  Florian Buff, MD   03/09/2017 8:03 PM

## 2017-05-08 DIAGNOSIS — J343 Hypertrophy of nasal turbinates: Secondary | ICD-10-CM | POA: Diagnosis not present

## 2017-05-08 DIAGNOSIS — I10 Essential (primary) hypertension: Secondary | ICD-10-CM | POA: Diagnosis not present

## 2017-05-08 DIAGNOSIS — Z6820 Body mass index (BMI) 20.0-20.9, adult: Secondary | ICD-10-CM | POA: Diagnosis not present

## 2017-05-08 DIAGNOSIS — D45 Polycythemia vera: Secondary | ICD-10-CM | POA: Diagnosis not present

## 2017-05-08 DIAGNOSIS — J069 Acute upper respiratory infection, unspecified: Secondary | ICD-10-CM | POA: Diagnosis not present

## 2017-05-08 DIAGNOSIS — M1991 Primary osteoarthritis, unspecified site: Secondary | ICD-10-CM | POA: Diagnosis not present

## 2017-07-03 ENCOUNTER — Other Ambulatory Visit (HOSPITAL_COMMUNITY): Payer: Self-pay | Admitting: *Deleted

## 2017-07-03 ENCOUNTER — Encounter (HOSPITAL_COMMUNITY): Payer: Self-pay | Admitting: *Deleted

## 2017-07-03 DIAGNOSIS — D45 Polycythemia vera: Secondary | ICD-10-CM

## 2017-07-07 ENCOUNTER — Encounter (HOSPITAL_BASED_OUTPATIENT_CLINIC_OR_DEPARTMENT_OTHER): Payer: Medicare Other | Admitting: Oncology

## 2017-07-07 ENCOUNTER — Ambulatory Visit: Payer: Medicare Other | Admitting: Obstetrics & Gynecology

## 2017-07-07 ENCOUNTER — Encounter (HOSPITAL_COMMUNITY): Payer: Medicare Other | Attending: Oncology

## 2017-07-07 ENCOUNTER — Ambulatory Visit (HOSPITAL_COMMUNITY): Payer: Medicare Other | Admitting: Oncology

## 2017-07-07 ENCOUNTER — Other Ambulatory Visit (HOSPITAL_COMMUNITY): Payer: Medicare Other

## 2017-07-07 VITALS — BP 126/76 | HR 69 | Temp 98.1°F | Resp 20 | Wt 125.6 lb

## 2017-07-07 DIAGNOSIS — D45 Polycythemia vera: Secondary | ICD-10-CM | POA: Diagnosis not present

## 2017-07-07 LAB — CBC WITH DIFFERENTIAL/PLATELET
BASOS PCT: 1 %
Basophils Absolute: 0.1 10*3/uL (ref 0.0–0.1)
EOS ABS: 0.2 10*3/uL (ref 0.0–0.7)
Eosinophils Relative: 2 %
HEMATOCRIT: 42.5 % (ref 36.0–46.0)
HEMOGLOBIN: 13.8 g/dL (ref 12.0–15.0)
LYMPHS ABS: 1.1 10*3/uL (ref 0.7–4.0)
Lymphocytes Relative: 14 %
MCH: 32.4 pg (ref 26.0–34.0)
MCHC: 32.5 g/dL (ref 30.0–36.0)
MCV: 99.8 fL (ref 78.0–100.0)
MONO ABS: 0.6 10*3/uL (ref 0.1–1.0)
MONOS PCT: 8 %
NEUTROS PCT: 75 %
Neutro Abs: 5.5 10*3/uL (ref 1.7–7.7)
Platelets: 540 10*3/uL — ABNORMAL HIGH (ref 150–400)
RBC: 4.26 MIL/uL (ref 3.87–5.11)
RDW: 15.4 % (ref 11.5–15.5)
WBC: 7.3 10*3/uL (ref 4.0–10.5)

## 2017-07-07 LAB — COMPREHENSIVE METABOLIC PANEL
ALK PHOS: 82 U/L (ref 38–126)
ALT: 14 U/L (ref 14–54)
ANION GAP: 8 (ref 5–15)
AST: 19 U/L (ref 15–41)
Albumin: 4 g/dL (ref 3.5–5.0)
BILIRUBIN TOTAL: 1 mg/dL (ref 0.3–1.2)
BUN: 14 mg/dL (ref 6–20)
CALCIUM: 9 mg/dL (ref 8.9–10.3)
CO2: 27 mmol/L (ref 22–32)
Chloride: 102 mmol/L (ref 101–111)
Creatinine, Ser: 0.7 mg/dL (ref 0.44–1.00)
GFR calc non Af Amer: >60 mL/min (ref >60)
Glucose, Bld: 95 mg/dL (ref 65–99)
POTASSIUM: 3.8 mmol/L (ref 3.5–5.1)
SODIUM: 137 mmol/L (ref 135–145)
TOTAL PROTEIN: 7.5 g/dL (ref 6.5–8.1)

## 2017-07-07 LAB — LACTATE DEHYDROGENASE: LDH: 173 U/L (ref 98–192)

## 2017-07-07 MED ORDER — HYDROXYUREA 500 MG PO CAPS: ORAL_CAPSULE | ORAL | 2 refills | Status: DC

## 2017-07-07 NOTE — Patient Instructions (Addendum)
[place] at [facility] Discharge Instructions  RECOMMENDATIONS MADE BY THE CONSULTANT AND ANY TEST RESULTS WILL BE SENT TO YOUR REFERRING PHYSICIAN.  Increase Hydrea to 4 times per week - Monday, Wednesday, Friday, Saturday  Labs monthly  Return in 6 months for follow up   Thank you for choosing [place] at [facility] to provide your oncology and hematology care.  To afford each patient quality time with our provider, please arrive at least 15 minutes before your scheduled appointment time.    If you have a lab appointment with the [place] please come in thru the  Main Entrance and check in at the main information desk  You need to re-schedule your appointment should you arrive 10 or more minutes late.  We strive to give you quality time with our providers, and arriving late affects you and other patients whose appointments are after yours.  Also, if you no show three or more times for appointments you may be dismissed from the clinic at the providers discretion.     Again, thank you for choosing [facility].  Our hope is that these requests will decrease the amount of time that you wait before being seen by our physicians.       _____________________________________________________________  Should you have questions after your visit to [facility], please contact our office at [phone] between the hours of 8:30 a.m. and 4:30 p.m.  Voicemails left after 4:30 p.m. will not be returned until the following business day.  For prescription refill requests, have your pharmacy contact our office.       Resources For Cancer Patients and their Caregivers ? American Cancer Society: Can assist with transportation, wigs, general needs, runs Look Good Feel Better.        [phone] [phone] ? Cancer Care: Provides financial assistance, online support groups, medication/co-pay assistance.  1-800-813-HOPE  (863)404-7783) ? [place] Assists [place] cancer patients and their families through emotional , educational and financial support.  ([phone] ? [place] DSS Where to apply for food stamps, Medicaid and utility assistance. 414-566-2780 ? RCATS: Transportation to medical appointments. 307-061-8174 ? Social Security Administration: May apply for disability if have a Stage IV cancer. [phone] [phone] ? [employer], Disability and Transit Services: Assists with nutrition, care and transit needs. [place] Support Programs: @10RELATIVEDAYS @ > Cancer Support Group  2nd Tuesday of the month 1pm-2pm, Journey Room  > Creative Journey  3rd Tuesday of the month 1130am-1pm, Journey Room  > Look Good Feel Better  1st Wednesday of the month 10am-12 noon, Journey Room (Call [place] to register [phone])

## 2017-07-07 NOTE — Assessment & Plan Note (Addendum)
Polycythemia vera, JAK2 V617F POSITIVE, diagnosed in August 2005.  Managed by Hydrea.  Labs today: CBC diff, CMET, LDH.  I personally reviewed and went over laboratory results with the patient.  The results are noted within this dictation.  Platelet count is above goal, therefore, I will INCREASE her Hydrea dose to 500 mg PO four times weekly (Mon, Wed, Fri, and Sat).  Goal is to maintain platelet count of 450K or less.   Labs monthly: CBC diff. Labs in 3 and 6 months: CMET.  If counts are good and stable in 6 months, can consider decreasing labs appointments to every 6 months.  Increase dose of Hydrea as described above.  Nursing, in accordance with chemotherapy administration protocol, will monitor for acute side effects/toxicities associated with chemotherapy administration today and moving forward.  New Rx for Hydrea is escribed with new dosing directions.  Return in 6 months for follow-up.

## 2017-07-07 NOTE — Progress Notes (Signed)
Briana Sites, MD [place] [address]  Polycythemia vera [facility]) - Plan: montelukast (SINGULAIR) 10 MG tablet, amLODipine (NORVASC) 10 MG tablet, hydroxyurea (HYDREA) 500 MG capsule, CBC with Differential, Comprehensive metabolic panel  CURRENT THERAPY: Hydrea 500 mg 3 times weekly  INTERVAL HISTORY: [name] 81 y.o. female returns for followup of Polycythemia vera, JAK2 V617F POSITIVE, diagnosed in August 2005.  On Hydrea 500 mg three times weekly with excellent control.   HPI Elements   Location: Blood  Quality: Polycythemia vera  Severity: Well controlled  Duration: Dx in August 2005  Context: On Hydrea management  Timing:   Modifying Factors:   Associated Signs & Symptoms:     Review of Systems  Constitutional: Negative.  Negative for chills, fever and weight loss.  HENT: Negative.   Eyes: Negative.   Respiratory: Negative.  Negative for cough.   Cardiovascular: Negative.  Negative for chest pain.  Gastrointestinal: Negative.  Negative for blood in stool, constipation, diarrhea, melena, nausea and vomiting.  Genitourinary: Negative.   Musculoskeletal: Negative.   Skin: Negative.   Neurological: Negative.  Negative for weakness.  Endo/Heme/Allergies: Negative.   Psychiatric/Behavioral: Negative.     Past Medical History:  Diagnosis Date  . Hypertension   . Influenza 11/2012  . Osteoporosis   . Polycythemia   . Polycythemia vera(238.4) 02/25/2013   Diagnosed August 2005 on Hydrea 500 mg 3 times a week with excellent control.   . Prolapse, uterus, congenital    ring placement    Past Surgical History:  Procedure Laterality Date  . APPENDECTOMY    . COLONOSCOPY    . OOPHORECTOMY     left  . opperectomy      Family History  Problem Relation Age of Onset  . Cancer Brother        throat cancer  . Cancer Brother        lung cancer  . Cancer Brother        prostate cancer    Social History   Social History  .  Marital status: Divorced    Spouse name: N/A  . Number of children: N/A  . Years of education: N/A   Social History Main Topics  . Smoking status: Never Smoker  . Smokeless tobacco: Never Used  . Alcohol use No  . Drug use: No  . Sexual activity: Yes    Birth control/ protection: None   Other Topics Concern  . Not on file   Social History Narrative  . No narrative on file     PHYSICAL EXAMINATION  ECOG PERFORMANCE STATUS: 0 - Asymptomatic  Vitals:   07/07/17 1349  BP: 126/76  Pulse: 69  Resp: 20  Temp: 98.1 F (36.7 C)    GENERAL:alert, no distress, well nourished, well developed, comfortable, cooperative, smiling and accompanied by her twin sister, Bland Span, who also has P. Vanita Ingles and is being seen today in another exam room by me next. SKIN: skin color, texture, turgor are normal, no rashes or significant lesions HEAD: Normocephalic, No masses, lesions, tenderness or abnormalities EYES: normal, EOMI, Conjunctiva are pink and non-injected EARS: External ears normal OROPHARYNX:lips, buccal mucosa, and tongue normal and mucous membranes are moist  NECK: supple, no adenopathy, trachea midline LYMPH:  no palpable lymphadenopathy BREAST:not examined LUNGS: clear to auscultation  HEART: regular rate & rhythm, no murmurs and no gallops ABDOMEN:abdomen soft and normal bowel sounds BACK: Back symmetric, no curvature. EXTREMITIES:less then 2 second  capillary refill, no joint deformities, effusion, or inflammation, no skin discoloration, no cyanosis  NEURO: alert & oriented x 3 with fluent speech, no focal motor/sensory deficits, gait normal   LABORATORY DATA: CBC    Component Value Date/Time   WBC 7.3 07/07/2017 1315   RBC 4.26 07/07/2017 1315   HGB 13.8 07/07/2017 1315   HCT 42.5 07/07/2017 1315   PLT 540 (H) 07/07/2017 1315   MCV 99.8 07/07/2017 1315   MCH 32.4 07/07/2017 1315   MCHC 32.5 07/07/2017 1315   RDW 15.4 07/07/2017 1315   LYMPHSABS 1.1 07/07/2017  1315   MONOABS 0.6 07/07/2017 1315   EOSABS 0.2 07/07/2017 1315   BASOSABS 0.1 07/07/2017 1315      Chemistry      Component Value Date/Time   NA 137 07/07/2017 1315   K 3.8 07/07/2017 1315   CL 102 07/07/2017 1315   CO2 27 07/07/2017 1315   BUN 14 07/07/2017 1315   CREATININE 0.70 07/07/2017 1315      Component Value Date/Time   CALCIUM 9.0 07/07/2017 1315   ALKPHOS 82 07/07/2017 1315   AST 19 07/07/2017 1315   ALT 14 07/07/2017 1315   BILITOT 1.0 07/07/2017 1315        PENDING LABS:   RADIOGRAPHIC STUDIES:  No results found.   PATHOLOGY:    ASSESSMENT AND PLAN:  Polycythemia vera ([place]) Polycythemia vera, JAK2 V617F POSITIVE, diagnosed in August 2005.  Managed by Hydrea.  Labs today: CBC diff, CMET, LDH.  I personally reviewed and went over laboratory results with the patient.  The results are noted within this dictation.  Platelet count is above goal, therefore, I will INCREASE her Hydrea dose to 500 mg PO four times weekly (Mon, Wed, Fri, and Sat).  Goal is to maintain platelet count of 450K or less.   Labs monthly: CBC diff. Labs in 3 and 6 months: CMET.  If counts are good and stable in 6 months, can consider decreasing labs appointments to every 6 months.  Increase dose of Hydrea as described above.  Nursing, in accordance with chemotherapy administration protocol, will monitor for acute side effects/toxicities associated with chemotherapy administration today and moving forward.  New Rx for Hydrea is escribed with new dosing directions.  Return in 6 months for follow-up.  ORDERS PLACED FOR THIS ENCOUNTER: Orders Placed This Encounter  Procedures  . CBC with Differential  . Comprehensive metabolic panel    MEDICATIONS PRESCRIBED THIS ENCOUNTER: Meds ordered this encounter  Medications  . montelukast (SINGULAIR) 10 MG tablet    Sig: Take 10 mg by mouth daily as needed.  Marland Kitchen amLODipine (NORVASC) 10 MG tablet    Sig: Take 10 mg by mouth daily.    . hydroxyurea (HYDREA) 500 MG capsule    Sig: Take 500 mg PO on Monday, Wednesday, Friday, Saturday.  May take with food to minimize GI side effects.    Dispense:  48 capsule    Refill:  2    Dose change 07/07/2017- TK    Order Specific Question:   Supervising Provider    Answer:   Brunetta Genera [[id number]]    THERAPY PLAN:  Change dose of Hydrea to four time weekly.  Maintain treatment goal of HCT < 45% and platelet count 450K or less.  All questions were answered. The patient knows to call the clinic with any problems, questions or concerns. We can certainly see the patient much sooner if necessary.  Patient and plan discussed with Dr.  Twana First and she is in agreement with the aforementioned.   This note is electronically signed by: Doy Mince 07/07/2017 2:23 PM

## 2017-07-18 ENCOUNTER — Ambulatory Visit: Payer: Medicare Other | Admitting: Obstetrics & Gynecology

## 2017-07-29 ENCOUNTER — Encounter: Payer: Self-pay | Admitting: Obstetrics & Gynecology

## 2017-07-29 ENCOUNTER — Ambulatory Visit (INDEPENDENT_AMBULATORY_CARE_PROVIDER_SITE_OTHER): Payer: Medicare Other | Admitting: Obstetrics & Gynecology

## 2017-07-29 VITALS — BP 118/60 | HR 78 | Wt 126.0 lb

## 2017-07-29 DIAGNOSIS — N813 Complete uterovaginal prolapse: Secondary | ICD-10-CM

## 2017-07-29 DIAGNOSIS — Z4689 Encounter for fitting and adjustment of other specified devices: Secondary | ICD-10-CM | POA: Diagnosis not present

## 2017-07-29 NOTE — Progress Notes (Signed)
Chief Complaint  Patient presents with  . Pessary Check    clean   Blood pressure 118/60, pulse 78, weight 126 lb (57.2 kg).  Bess Harvest Christen A for routine follow-up related to her pessary she uses Milex ring with support #2 She reports little vaginal discharge and no bleeding  Exam reveals no undue mucosal pressure or breakdown minimal discharge and minimal vaginal bleeding present  The pessary is removed clean replaced that difficulty we'll see Cadience back in 4 months for continued follow-up maintenance of her pessary  Florian Buff, MD 07/29/2017 3:46 PM

## 2017-08-07 ENCOUNTER — Encounter (HOSPITAL_COMMUNITY): Payer: Medicare Other | Attending: Oncology

## 2017-08-07 DIAGNOSIS — D45 Polycythemia vera: Secondary | ICD-10-CM | POA: Insufficient documentation

## 2017-08-07 LAB — CBC WITH DIFFERENTIAL/PLATELET
Basophils Absolute: 0.1 10*3/uL (ref 0.0–0.1)
Basophils Relative: 1 %
EOS ABS: 0.2 10*3/uL (ref 0.0–0.7)
Eosinophils Relative: 4 %
HEMATOCRIT: 40.8 % (ref 36.0–46.0)
HEMOGLOBIN: 13.1 g/dL (ref 12.0–15.0)
LYMPHS ABS: 1.2 10*3/uL (ref 0.7–4.0)
Lymphocytes Relative: 19 %
MCH: 32.6 pg (ref 26.0–34.0)
MCHC: 32.1 g/dL (ref 30.0–36.0)
MCV: 101.5 fL — ABNORMAL HIGH (ref 78.0–100.0)
MONO ABS: 0.5 10*3/uL (ref 0.1–1.0)
MONOS PCT: 8 %
NEUTROS ABS: 4.4 10*3/uL (ref 1.7–7.7)
NEUTROS PCT: 68 %
Platelets: 449 10*3/uL — ABNORMAL HIGH (ref 150–400)
RBC: 4.02 MIL/uL (ref 3.87–5.11)
RDW: 15.1 % (ref 11.5–15.5)
WBC: 6.4 10*3/uL (ref 4.0–10.5)

## 2017-08-07 LAB — COMPREHENSIVE METABOLIC PANEL
ALK PHOS: 86 U/L (ref 38–126)
ALT: 11 U/L — ABNORMAL LOW (ref 14–54)
ANION GAP: 8 (ref 5–15)
AST: 20 U/L (ref 15–41)
Albumin: 3.9 g/dL (ref 3.5–5.0)
BILIRUBIN TOTAL: 0.7 mg/dL (ref 0.3–1.2)
BUN: 12 mg/dL (ref 6–20)
CALCIUM: 8.6 mg/dL — AB (ref 8.9–10.3)
CO2: 28 mmol/L (ref 22–32)
Chloride: 102 mmol/L (ref 101–111)
Creatinine, Ser: 0.59 mg/dL (ref 0.44–1.00)
GFR calc non Af Amer: >60 mL/min (ref >60)
Glucose, Bld: 113 mg/dL — ABNORMAL HIGH (ref 65–99)
POTASSIUM: 3.4 mmol/L — AB (ref 3.5–5.1)
Sodium: 138 mmol/L (ref 135–145)
TOTAL PROTEIN: 7.2 g/dL (ref 6.5–8.1)

## 2017-08-22 ENCOUNTER — Other Ambulatory Visit (HOSPITAL_COMMUNITY): Payer: Self-pay | Admitting: Family Medicine

## 2017-08-22 DIAGNOSIS — Z1231 Encounter for screening mammogram for malignant neoplasm of breast: Secondary | ICD-10-CM

## 2017-09-08 ENCOUNTER — Encounter (HOSPITAL_COMMUNITY): Payer: Medicare Other | Attending: Oncology

## 2017-09-08 DIAGNOSIS — D45 Polycythemia vera: Secondary | ICD-10-CM

## 2017-09-08 LAB — COMPREHENSIVE METABOLIC PANEL
ALBUMIN: 4 g/dL (ref 3.5–5.0)
ALK PHOS: 84 U/L (ref 38–126)
ALT: 13 U/L — ABNORMAL LOW (ref 14–54)
ANION GAP: 7 (ref 5–15)
AST: 19 U/L (ref 15–41)
BUN: 14 mg/dL (ref 6–20)
CALCIUM: 8.8 mg/dL — AB (ref 8.9–10.3)
CHLORIDE: 102 mmol/L (ref 101–111)
CO2: 29 mmol/L (ref 22–32)
Creatinine, Ser: 0.54 mg/dL (ref 0.44–1.00)
GFR calc non Af Amer: >60 mL/min (ref >60)
GLUCOSE: 94 mg/dL (ref 65–99)
POTASSIUM: 3.8 mmol/L (ref 3.5–5.1)
SODIUM: 138 mmol/L (ref 135–145)
Total Bilirubin: 0.6 mg/dL (ref 0.3–1.2)
Total Protein: 7.3 g/dL (ref 6.5–8.1)

## 2017-09-08 LAB — CBC WITH DIFFERENTIAL/PLATELET
BASOS PCT: 2 %
Basophils Absolute: 0.1 10*3/uL (ref 0.0–0.1)
EOS ABS: 0.2 10*3/uL (ref 0.0–0.7)
Eosinophils Relative: 3 %
HCT: 40.4 % (ref 36.0–46.0)
HEMOGLOBIN: 13.1 g/dL (ref 12.0–15.0)
LYMPHS ABS: 1.3 10*3/uL (ref 0.7–4.0)
Lymphocytes Relative: 21 %
MCH: 33 pg (ref 26.0–34.0)
MCHC: 32.4 g/dL (ref 30.0–36.0)
MCV: 101.8 fL — ABNORMAL HIGH (ref 78.0–100.0)
MONO ABS: 0.5 10*3/uL (ref 0.1–1.0)
MONOS PCT: 8 %
Neutro Abs: 4 10*3/uL (ref 1.7–7.7)
Neutrophils Relative %: 66 %
PLATELETS: 397 10*3/uL (ref 150–400)
RBC: 3.97 MIL/uL (ref 3.87–5.11)
RDW: 14.9 % (ref 11.5–15.5)
WBC: 6.2 10*3/uL (ref 4.0–10.5)

## 2017-09-26 DIAGNOSIS — Z6821 Body mass index (BMI) 21.0-21.9, adult: Secondary | ICD-10-CM | POA: Diagnosis not present

## 2017-09-26 DIAGNOSIS — L0291 Cutaneous abscess, unspecified: Secondary | ICD-10-CM | POA: Diagnosis not present

## 2017-10-01 ENCOUNTER — Ambulatory Visit (HOSPITAL_COMMUNITY): Payer: Medicare Other

## 2017-10-08 ENCOUNTER — Encounter (HOSPITAL_COMMUNITY): Payer: Medicare Other | Attending: Oncology

## 2017-10-08 DIAGNOSIS — D45 Polycythemia vera: Secondary | ICD-10-CM | POA: Diagnosis not present

## 2017-10-08 LAB — CBC WITH DIFFERENTIAL/PLATELET
Basophils Absolute: 0.1 10*3/uL (ref 0.0–0.1)
Basophils Relative: 1 %
Eosinophils Absolute: 0.2 10*3/uL (ref 0.0–0.7)
Eosinophils Relative: 3 %
HEMATOCRIT: 39.4 % (ref 36.0–46.0)
HEMOGLOBIN: 12.6 g/dL (ref 12.0–15.0)
LYMPHS ABS: 1.3 10*3/uL (ref 0.7–4.0)
LYMPHS PCT: 21 %
MCH: 32.9 pg (ref 26.0–34.0)
MCHC: 32 g/dL (ref 30.0–36.0)
MCV: 102.9 fL — AB (ref 78.0–100.0)
Monocytes Absolute: 0.5 10*3/uL (ref 0.1–1.0)
Monocytes Relative: 8 %
NEUTROS ABS: 3.9 10*3/uL (ref 1.7–7.7)
NEUTROS PCT: 67 %
Platelets: 341 10*3/uL (ref 150–400)
RBC: 3.83 MIL/uL — AB (ref 3.87–5.11)
RDW: 14.8 % (ref 11.5–15.5)
WBC: 5.8 10*3/uL (ref 4.0–10.5)

## 2017-10-20 ENCOUNTER — Ambulatory Visit (HOSPITAL_COMMUNITY): Payer: Medicare Other

## 2017-11-05 ENCOUNTER — Ambulatory Visit (HOSPITAL_COMMUNITY)
Admission: RE | Admit: 2017-11-05 | Discharge: 2017-11-05 | Disposition: A | Discharge status: Home / Self Care | Payer: Medicare Other | Source: Ambulatory Visit | Attending: Family Medicine | Admitting: Family Medicine

## 2017-11-05 ENCOUNTER — Encounter (HOSPITAL_COMMUNITY): Payer: Self-pay

## 2017-11-05 DIAGNOSIS — Z1231 Encounter for screening mammogram for malignant neoplasm of breast: Secondary | ICD-10-CM | POA: Diagnosis not present

## 2017-11-10 ENCOUNTER — Encounter (HOSPITAL_COMMUNITY): Payer: Medicare Other | Attending: Oncology

## 2017-11-10 DIAGNOSIS — D45 Polycythemia vera: Secondary | ICD-10-CM | POA: Insufficient documentation

## 2017-11-10 LAB — CBC WITH DIFFERENTIAL/PLATELET
Basophils Absolute: 0.1 10*3/uL (ref 0.0–0.1)
Basophils Relative: 1 %
Eosinophils Absolute: 0.2 10*3/uL (ref 0.0–0.7)
Eosinophils Relative: 4 %
HCT: 40.2 % (ref 36.0–46.0)
Hemoglobin: 12.6 g/dL (ref 12.0–15.0)
Lymphocytes Relative: 20 %
Lymphs Abs: 1 10*3/uL (ref 0.7–4.0)
MCH: 32.6 pg (ref 26.0–34.0)
MCHC: 31.3 g/dL (ref 30.0–36.0)
MCV: 104.1 fL — ABNORMAL HIGH (ref 78.0–100.0)
Monocytes Absolute: 0.6 10*3/uL (ref 0.1–1.0)
Monocytes Relative: 11 %
Neutro Abs: 3.3 10*3/uL (ref 1.7–7.7)
Neutrophils Relative %: 64 %
Platelets: 368 10*3/uL (ref 150–400)
RBC: 3.86 MIL/uL — ABNORMAL LOW (ref 3.87–5.11)
RDW: 14.7 % (ref 11.5–15.5)
WBC: 5.2 10*3/uL (ref 4.0–10.5)

## 2017-11-18 DIAGNOSIS — Z1389 Encounter for screening for other disorder: Secondary | ICD-10-CM | POA: Diagnosis not present

## 2017-11-18 DIAGNOSIS — Z6821 Body mass index (BMI) 21.0-21.9, adult: Secondary | ICD-10-CM | POA: Diagnosis not present

## 2017-11-18 DIAGNOSIS — Z0000 Encounter for general adult medical examination without abnormal findings: Secondary | ICD-10-CM | POA: Diagnosis not present

## 2017-11-28 ENCOUNTER — Ambulatory Visit: Payer: Medicare Other | Admitting: Obstetrics & Gynecology

## 2017-12-11 ENCOUNTER — Encounter (HOSPITAL_COMMUNITY): Payer: Medicare Other | Attending: Oncology

## 2017-12-11 DIAGNOSIS — D45 Polycythemia vera: Secondary | ICD-10-CM

## 2017-12-11 LAB — CBC WITH DIFFERENTIAL/PLATELET
BASOS ABS: 0.1 10*3/uL (ref 0.0–0.1)
BASOS PCT: 2 %
Eosinophils Absolute: 0.2 10*3/uL (ref 0.0–0.7)
Eosinophils Relative: 3 %
HCT: 41 % (ref 36.0–46.0)
Hemoglobin: 13 g/dL (ref 12.0–15.0)
Lymphocytes Relative: 24 %
Lymphs Abs: 1.3 10*3/uL (ref 0.7–4.0)
MCH: 32.7 pg (ref 26.0–34.0)
MCHC: 31.7 g/dL (ref 30.0–36.0)
MCV: 103 fL — ABNORMAL HIGH (ref 78.0–100.0)
MONO ABS: 0.5 10*3/uL (ref 0.1–1.0)
Monocytes Relative: 9 %
Neutro Abs: 3.5 10*3/uL (ref 1.7–7.7)
Neutrophils Relative %: 62 %
Platelets: 350 10*3/uL (ref 150–400)
RBC: 3.98 MIL/uL (ref 3.87–5.11)
RDW: 14.5 % (ref 11.5–15.5)
WBC: 5.6 10*3/uL (ref 4.0–10.5)

## 2017-12-25 DIAGNOSIS — H43811 Vitreous degeneration, right eye: Secondary | ICD-10-CM | POA: Diagnosis not present

## 2017-12-25 DIAGNOSIS — H25813 Combined forms of age-related cataract, bilateral: Secondary | ICD-10-CM | POA: Diagnosis not present

## 2017-12-30 ENCOUNTER — Encounter: Payer: Self-pay | Admitting: Obstetrics & Gynecology

## 2017-12-30 ENCOUNTER — Other Ambulatory Visit: Payer: Self-pay

## 2017-12-30 ENCOUNTER — Ambulatory Visit (INDEPENDENT_AMBULATORY_CARE_PROVIDER_SITE_OTHER): Payer: Medicare Other | Admitting: Obstetrics & Gynecology

## 2017-12-30 VITALS — BP 102/70 | HR 82 | Ht 65.0 in | Wt 127.0 lb

## 2017-12-30 DIAGNOSIS — Z4689 Encounter for fitting and adjustment of other specified devices: Secondary | ICD-10-CM

## 2017-12-30 DIAGNOSIS — N8110 Cystocele, unspecified: Secondary | ICD-10-CM | POA: Diagnosis not present

## 2017-12-30 NOTE — Progress Notes (Signed)
  Patient ID: [name], female   DOB: 1936-04-20, 82 y.o.   MRN: [id number] [name] is in today for routine pessary maintenance She uses a Milex ring with support #2 and has for many many years She states she is not having any problems with bleeding or discharge or discomfort  Blood pressure 102/70, pulse 82, height 5\' 5"  (1.651 m), weight 127 lb (57.6 kg). On exam today the pessary is removed with a small amount of difficulty and a little bit of blood from mucosal stretching The pessary is cleaned with warm soap and water The vagina reveals no undue pressure or breakdown The pessary is replaced into the vagina without difficulty  I will see she will be back in 4 months for ongoing pessary maintenance or prior to then should she have any problems

## 2018-01-07 ENCOUNTER — Other Ambulatory Visit (HOSPITAL_COMMUNITY): Payer: Medicare Other

## 2018-01-07 ENCOUNTER — Ambulatory Visit (HOSPITAL_COMMUNITY): Payer: Medicare Other | Admitting: Adult Health

## 2018-01-14 ENCOUNTER — Encounter (HOSPITAL_COMMUNITY): Payer: Self-pay | Admitting: Internal Medicine

## 2018-01-14 ENCOUNTER — Other Ambulatory Visit: Payer: Self-pay

## 2018-01-14 ENCOUNTER — Inpatient Hospital Stay (HOSPITAL_COMMUNITY): Payer: Medicare Other | Attending: Adult Health

## 2018-01-14 ENCOUNTER — Inpatient Hospital Stay (HOSPITAL_BASED_OUTPATIENT_CLINIC_OR_DEPARTMENT_OTHER): Payer: Medicare Other | Admitting: Internal Medicine

## 2018-01-14 DIAGNOSIS — D45 Polycythemia vera: Secondary | ICD-10-CM | POA: Diagnosis not present

## 2018-01-14 DIAGNOSIS — I10 Essential (primary) hypertension: Secondary | ICD-10-CM

## 2018-01-14 DIAGNOSIS — M818 Other osteoporosis without current pathological fracture: Secondary | ICD-10-CM | POA: Diagnosis not present

## 2018-01-14 LAB — CBC WITH DIFFERENTIAL/PLATELET
BASOS ABS: 0.1 10*3/uL (ref 0.0–0.1)
Basophils Relative: 1 %
EOS PCT: 2 %
Eosinophils Absolute: 0.2 10*3/uL (ref 0.0–0.7)
HCT: 41.6 % (ref 36.0–46.0)
Hemoglobin: 13 g/dL (ref 12.0–15.0)
LYMPHS ABS: 1.3 10*3/uL (ref 0.7–4.0)
LYMPHS PCT: 18 %
MCH: 32.2 pg (ref 26.0–34.0)
MCHC: 31.3 g/dL (ref 30.0–36.0)
MCV: 103 fL — AB (ref 78.0–100.0)
MONO ABS: 0.5 10*3/uL (ref 0.1–1.0)
Monocytes Relative: 7 %
Neutro Abs: 5.2 10*3/uL (ref 1.7–7.7)
Neutrophils Relative %: 72 %
PLATELETS: 376 10*3/uL (ref 150–400)
RBC: 4.04 MIL/uL (ref 3.87–5.11)
RDW: 14.6 % (ref 11.5–15.5)
WBC: 7.2 10*3/uL (ref 4.0–10.5)

## 2018-01-14 NOTE — Patient Instructions (Addendum)
[place] at [facility] Discharge Instructions  RECOMMENDATIONS MADE BY THE CONSULTANT AND ANY TEST RESULTS WILL BE SENT TO YOUR REFERRING PHYSICIAN.  Labs reviewed. Platelets good. Return to see Md and labs in July 2019. Copy of labs given to you today.   Thank you for choosing [place] at [facility] to provide your oncology and hematology care.  To afford each patient quality time with our provider, please arrive at least 15 minutes before your scheduled appointment time.    If you have a lab appointment with the [place] please come in thru the  Main Entrance and check in at the main information desk  You need to re-schedule your appointment should you arrive 10 or more minutes late.  We strive to give you quality time with our providers, and arriving late affects you and other patients whose appointments are after yours.  Also, if you no show three or more times for appointments you may be dismissed from the clinic at the providers discretion.     Again, thank you for choosing [facility].  Our hope is that these requests will decrease the amount of time that you wait before being seen by our physicians.       _____________________________________________________________  Should you have questions after your visit to [facility], please contact our office at [phone] between the hours of 8:30 a.m. and 4:30 p.m.  Voicemails left after 4:30 p.m. will not be returned until the following business day.  For prescription refill requests, have your pharmacy contact our office.       Resources For Cancer Patients and their Caregivers ? American Cancer Society: Can assist with transportation, wigs, general needs, runs Look Good Feel Better.        ([phone] ? Cancer Care: Provides financial assistance, online support groups, medication/co-pay assistance.  1-800-813-HOPE 970-596-7509) ? [place] Assists [place] cancer patients and their families through emotional , educational and financial support.  9863730305 ? [place] DSS Where to apply for food stamps, Medicaid and utility assistance. 314-526-1861 ? RCATS: Transportation to medical appointments. 517-789-6378 ? Social Security Administration: May apply for disability if have a Stage IV cancer. 445-268-3363 [phone] ? [employer], Disability and Transit Services: Assists with nutrition, care and transit needs. [place] Support Programs: @10RELATIVEDAYS @ > Cancer Support Group  2nd Tuesday of the month 1pm-2pm, Journey Room  > Creative Journey  3rd Tuesday of the month 1130am-1pm, Journey Room  > Look Good Feel Better  1st Wednesday of the month 10am-12 noon, Journey Room (Call [place] to register [phone])

## 2018-01-16 NOTE — Progress Notes (Signed)
Diagnosis Polycythemia vera ([place]) - Plan: CBC with Differential, Comprehensive metabolic panel, Lactate dehydrogenase  Staging Cancer Staging No matching staging information was found for the patient.  Assessment and Plan: 1 polycythemia vera ([place]) -Patient is currently on Hydrea 500 mg 3 times a week.  Labs performed today white count 7.2 hemoglobin 13 platelet count 376,000.  She will continue Hydrea as directed and will return to clinic for repeat lab evaluation.  She is advised to notify the office if she has any problems prior to her next visit.    2.  Hypertension.  She is on Norvasc.  Blood pressure is 102/70.  Continue to follow-up with her primary care physician as directed.  CURRENT THERAPY: Hydrea 500 mg 3 times weekly  INTERVAL HISTORY: [name] 82 y.o. female returns for followup of Polycythemia vera, JAK2 V617F POSITIVE, diagnosed in August 2005.  On Hydrea 500 mg three times weekly with excellent control.   Current status patient is seen today for follow-up she reports she is tolerating Hydrea without problems.  Denies any complaints today.   Problem List Patient Active Problem List   Diagnosis Date Noted  . Uterovaginal prolapse, complete [N81.3] 10/12/2013  . Vaginitis and vulvovaginitis, unspecified [N76.0] 07/05/2013  . Polycythemia vera ([place]) [D45] 02/25/2013    Past Medical History Past Medical History:  Diagnosis Date  . Hypertension   . Influenza 11/2012  . Osteoporosis   . Polycythemia   . Polycythemia vera(238.4) 02/25/2013   Diagnosed August 2005 on Hydrea 500 mg 3 times a week with excellent control.   . Prolapse, uterus, congenital    ring placement    Past Surgical History Past Surgical History:  Procedure Laterality Date  . APPENDECTOMY    . COLONOSCOPY    . OOPHORECTOMY     left  . opperectomy      Family History Family History  Problem Relation Age of Onset  . Cancer Brother        throat cancer  . Cancer Brother    lung cancer  . Cancer Brother        prostate cancer     Social History  reports that  has never smoked. she has never used smokeless tobacco. She reports that she does not drink alcohol or use drugs.  Medications  Current Outpatient Medications:  .  amLODipine (NORVASC) 10 MG tablet, , Disp: , Rfl:  .  aspirin 81 MG tablet, Take 81 mg by mouth daily.  , Disp: , Rfl:  .  Calcium Carbonate-Vitamin D (CALCIUM + D PO), Take 2,000 mg by mouth 2 (two) times daily.  , Disp: , Rfl:  .  hydroxyurea (HYDREA) 500 MG capsule, Take 500 mg PO on Monday, Wednesday, Friday, Saturday.  May take with food to minimize GI side effects., Disp: 48 capsule, Rfl: 2  Allergies Codeine; Diovan [valsartan]; Penicillins; and Ramipril  Review of Systems Review of Systems - Oncology ROS as per HPI otherwise 12 point ROS is negative.   Physical Exam  Vitals Wt Readings from Last 3 Encounters:  12/30/17 127 lb (57.6 kg)  07/29/17 126 lb (57.2 kg)  07/07/17 125 lb 9.6 oz (57 kg)   Temp Readings from Last 3 Encounters:  07/07/17 98.1 F (36.7 C) (Oral)  01/06/17 98.4 F (36.9 C) (Oral)  03/06/16 98 F (36.7 C) (Oral)   BP Readings from Last 3 Encounters:  12/30/17 102/70  07/29/17 118/60  07/07/17 126/76   Pulse Readings from Last 3 Encounters:  12/30/17  82  07/29/17 78  07/07/17 69   Constitutional: Well-developed, well-nourished, and in no distress.   HENT:  Head: Normocephalic and atraumatic.  Mouth/Throat: No oropharyngeal exudate. Mucosa moist. Eyes: Pupils are equal, round, and reactive to light. Conjunctivae are normal. No scleral icterus.  Neck: Normal range of motion. Neck supple. No JVD present.  Cardiovascular: Normal rate, regular rhythm and normal heart sounds.  Exam reveals no gallop and no friction rub.   No murmur heard. Pulmonary/Chest: Effort normal and breath sounds normal. No respiratory distress. No wheezes.No rales.  Abdominal: Soft. Bowel sounds are normal. No  distension. There is no tenderness. There is no guarding.  Musculoskeletal: No edema or tenderness.  Lymphadenopathy: No cervical, axillary or supraclavicular adenopathy.  Neurological: Alert and oriented to person, place, and time. No cranial nerve deficit.  Skin: Skin is warm and dry. No rash noted. No erythema. No pallor.  Psychiatric: Affect and judgment normal.   Labs Appointment on 01/14/2018  Component Date Value Ref Range Status  . WBC 01/14/2018 7.2  4.0 - 10.5 K/uL Final  . RBC 01/14/2018 4.04  3.87 - 5.11 MIL/uL Final  . Hemoglobin 01/14/2018 13.0  12.0 - 15.0 g/dL Final  . HCT 01/14/2018 41.6  36.0 - 46.0 % Final  . MCV 01/14/2018 103.0* 78.0 - 100.0 fL Final  . MCH 01/14/2018 32.2  26.0 - 34.0 pg Final  . MCHC 01/14/2018 31.3  30.0 - 36.0 g/dL Final  . RDW 01/14/2018 14.6  11.5 - 15.5 % Final  . Platelets 01/14/2018 376  150 - 400 K/uL Final  . Neutrophils Relative % 01/14/2018 72  % Final  . Neutro Abs 01/14/2018 5.2  1.7 - 7.7 K/uL Final  . Lymphocytes Relative 01/14/2018 18  % Final  . Lymphs Abs 01/14/2018 1.3  0.7 - 4.0 K/uL Final  . Monocytes Relative 01/14/2018 7  % Final  . Monocytes Absolute 01/14/2018 0.5  0.1 - 1.0 K/uL Final  . Eosinophils Relative 01/14/2018 2  % Final  . Eosinophils Absolute 01/14/2018 0.2  0.0 - 0.7 K/uL Final  . Basophils Relative 01/14/2018 1  % Final  . Basophils Absolute 01/14/2018 0.1  0.0 - 0.1 K/uL Final     Pathology Orders Placed This Encounter  Procedures  . CBC with Differential    Standing Status:   Future    Standing Expiration Date:   01/14/2019  . Comprehensive metabolic panel    Standing Status:   Future    Standing Expiration Date:   01/14/2019  . Lactate dehydrogenase    Standing Status:   Future    Standing Expiration Date:   01/14/2019       Zoila Shutter MD

## 2018-02-10 ENCOUNTER — Encounter: Payer: Self-pay | Admitting: Gastroenterology

## 2018-04-14 ENCOUNTER — Other Ambulatory Visit (HOSPITAL_COMMUNITY): Payer: Self-pay | Admitting: Oncology

## 2018-04-14 DIAGNOSIS — D45 Polycythemia vera: Secondary | ICD-10-CM

## 2018-04-30 ENCOUNTER — Ambulatory Visit: Payer: Medicare Other | Admitting: Obstetrics & Gynecology

## 2018-05-15 ENCOUNTER — Other Ambulatory Visit: Payer: Self-pay

## 2018-05-15 ENCOUNTER — Encounter (INDEPENDENT_AMBULATORY_CARE_PROVIDER_SITE_OTHER): Payer: Self-pay

## 2018-05-15 ENCOUNTER — Ambulatory Visit (INDEPENDENT_AMBULATORY_CARE_PROVIDER_SITE_OTHER): Payer: Medicare Other | Admitting: Obstetrics & Gynecology

## 2018-05-15 ENCOUNTER — Encounter: Payer: Self-pay | Admitting: Obstetrics & Gynecology

## 2018-05-15 VITALS — BP 142/76 | HR 93 | Ht 64.5 in | Wt 126.0 lb

## 2018-05-15 DIAGNOSIS — N813 Complete uterovaginal prolapse: Secondary | ICD-10-CM

## 2018-05-15 DIAGNOSIS — Z4689 Encounter for fitting and adjustment of other specified devices: Secondary | ICD-10-CM | POA: Diagnosis not present

## 2018-05-15 NOTE — Progress Notes (Signed)
Chief Complaint  Patient presents with  . Pessary Check    Blood pressure (!) 142/76, pulse 93, height 5' 4.5" (1.638 m), weight 126 lb (57.2 kg).  [name] presents today for routine follow up related to her pessary.   She uses a milex ring with support #2 She reports no vaginal discharge and a little vaginal bleeding.  Exam reveals no undue vaginal mucosal pressure of breakdown, no discharge and little vaginal bleeding.  The pessary is removed, cleaned and replaced without difficulty.    [name] will be sen back in 3 months for continued follow up.  Florian Buff, MD  05/15/2018 2:36 PM

## 2018-07-13 ENCOUNTER — Other Ambulatory Visit: Payer: Self-pay

## 2018-07-13 ENCOUNTER — Inpatient Hospital Stay (HOSPITAL_BASED_OUTPATIENT_CLINIC_OR_DEPARTMENT_OTHER): Payer: Medicare Other | Admitting: Internal Medicine

## 2018-07-13 ENCOUNTER — Inpatient Hospital Stay (HOSPITAL_COMMUNITY): Payer: Medicare Other | Attending: Hematology

## 2018-07-13 ENCOUNTER — Encounter (HOSPITAL_COMMUNITY): Payer: Self-pay | Admitting: Internal Medicine

## 2018-07-13 VITALS — BP 168/54 | HR 66 | Temp 97.9°F | Resp 16 | Wt 125.0 lb

## 2018-07-13 DIAGNOSIS — I10 Essential (primary) hypertension: Secondary | ICD-10-CM

## 2018-07-13 DIAGNOSIS — D7589 Other specified diseases of blood and blood-forming organs: Secondary | ICD-10-CM

## 2018-07-13 DIAGNOSIS — Z7982 Long term (current) use of aspirin: Secondary | ICD-10-CM | POA: Insufficient documentation

## 2018-07-13 DIAGNOSIS — D45 Polycythemia vera: Secondary | ICD-10-CM

## 2018-07-13 LAB — COMPREHENSIVE METABOLIC PANEL
ALBUMIN: 4 g/dL (ref 3.5–5.0)
ALT: 15 U/L (ref 0–44)
ANION GAP: 8 (ref 5–15)
AST: 25 U/L (ref 15–41)
Alkaline Phosphatase: 74 U/L (ref 38–126)
BUN: 17 mg/dL (ref 8–23)
CO2: 26 mmol/L (ref 22–32)
Calcium: 8.6 mg/dL — ABNORMAL LOW (ref 8.9–10.3)
Chloride: 104 mmol/L (ref 98–111)
Creatinine, Ser: 0.59 mg/dL (ref 0.44–1.00)
GFR calc Af Amer: >60 mL/min (ref >60)
GFR calc non Af Amer: >60 mL/min (ref >60)
GLUCOSE: 86 mg/dL (ref 70–99)
POTASSIUM: 3.9 mmol/L (ref 3.5–5.1)
SODIUM: 138 mmol/L (ref 135–145)
Total Bilirubin: 0.7 mg/dL (ref 0.3–1.2)
Total Protein: 7.1 g/dL (ref 6.5–8.1)

## 2018-07-13 LAB — CBC WITH DIFFERENTIAL/PLATELET
Basophils Absolute: 0.1 10*3/uL (ref 0.0–0.1)
Basophils Relative: 1 %
EOS ABS: 0.2 10*3/uL (ref 0.0–0.7)
Eosinophils Relative: 3 %
HCT: 38.7 % (ref 36.0–46.0)
Hemoglobin: 12.6 g/dL (ref 12.0–15.0)
LYMPHS ABS: 1.5 10*3/uL (ref 0.7–4.0)
LYMPHS PCT: 26 %
MCH: 33.6 pg (ref 26.0–34.0)
MCHC: 32.6 g/dL (ref 30.0–36.0)
MCV: 103.2 fL — ABNORMAL HIGH (ref 78.0–100.0)
MONOS PCT: 9 %
Monocytes Absolute: 0.5 10*3/uL (ref 0.1–1.0)
NEUTROS ABS: 3.6 10*3/uL (ref 1.7–7.7)
Neutrophils Relative %: 61 %
PLATELETS: 328 10*3/uL (ref 150–400)
RBC: 3.75 MIL/uL — ABNORMAL LOW (ref 3.87–5.11)
RDW: 14.5 % (ref 11.5–15.5)
WBC: 5.8 10*3/uL (ref 4.0–10.5)

## 2018-07-13 LAB — LACTATE DEHYDROGENASE: LDH: 155 U/L (ref 98–192)

## 2018-07-13 NOTE — Progress Notes (Signed)
Diagnosis Polycythemia vera ([place]) - Plan: CBC with Differential/Platelet, Comprehensive metabolic panel, Lactate dehydrogenase  Staging Cancer Staging No matching staging information was found for the patient.  Assessment and Plan:  1.  Polycythemia vera ([place]).  Patient on  Hydrea 500 mg 4 times a week.  Labs done 07/13/2018 reviewed with pt and shows WBC 5.8 HB 12.6 plts 328,000.  Chemistries WNL with Cr 0.59.  She will continue Hydrea at present dose.  She will RTC in 09/2018 for follow-up and repeat labs.    2.  Hypertension.  BP is 168/54.  Follow-up with PCP.    3.  Macrocytosis.  Due to Hydrea.  MCV 103.    3.  Health maintenance.  Continue mammogram screening as recommended.  Follow-up with GI and PCP as recommended.    CURRENT THERAPY: Hydrea 500 mg 4 times weekly  INTERVAL HISTORY: 82  y.o. female with Polycythemia vera, JAK2 V617F POSITIVE, diagnosed in August 2005.  On Hydrea 500 mg 4 times weekly.    CURRENT STATUS:  Patient is seen today for follow-up.  She is tolerating therapy without problems.    Problem List Patient Active Problem List   Diagnosis Date Noted  . Uterovaginal prolapse, complete [N81.3] 10/12/2013  . Vaginitis and vulvovaginitis, unspecified [N76.0] 07/05/2013  . Polycythemia vera ([place]) [D45] 02/25/2013    Past Medical History Past Medical History:  Diagnosis Date  . Hypertension   . Influenza 11/2012  . Osteoporosis   . Polycythemia   . Polycythemia vera(238.4) 02/25/2013   Diagnosed August 2005 on Hydrea 500 mg 3 times a week with excellent control.   . Prolapse, uterus, congenital    ring placement    Past Surgical History Past Surgical History:  Procedure Laterality Date  . APPENDECTOMY    . COLONOSCOPY    . OOPHORECTOMY     left  . opperectomy      Family History Family History  Problem Relation Age of Onset  . Cancer Brother        throat cancer  . Cancer Brother        lung cancer  . Cancer Brother        prostate  cancer     Social History  reports that she has never smoked. She has never used smokeless tobacco. She reports that she does not drink alcohol or use drugs.  Medications  Current Outpatient Medications:  .  amLODipine (NORVASC) 5 MG tablet, 5 mg. , Disp: , Rfl:  .  aspirin 81 MG tablet, Take 81 mg by mouth daily.  , Disp: , Rfl:  .  Calcium Carbonate-Vitamin D (CALCIUM + D PO), Take 2,000 mg by mouth 2 (two) times daily.  , Disp: , Rfl:  .  hydroxyurea (HYDREA) 500 MG capsule, TAKE ONE CAPSULE BY MOUTH ON MONDAY, WEDNESDAY & FRIDAY AND SATURDAY., Disp: 48 capsule, Rfl: 0  Allergies Codeine; Diovan [valsartan]; Penicillins; and Ramipril  Review of Systems Review of Systems - Oncology ROS negative   Physical Exam  Vitals Wt Readings from Last 3 Encounters:  07/13/18 125 lb (56.7 kg)  05/15/18 126 lb (57.2 kg)  12/30/17 127 lb (57.6 kg)   Temp Readings from Last 3 Encounters:  07/13/18 97.9 F (36.6 C) (Oral)  07/07/17 98.1 F (36.7 C) (Oral)  01/06/17 98.4 F (36.9 C) (Oral)   BP Readings from Last 3 Encounters:  07/13/18 (!) 168/54  05/15/18 (!) 142/76  12/30/17 102/70   Pulse Readings from Last 3 Encounters:  07/13/18  66  05/15/18 93  12/30/17 82   Constitutional: Well-developed, well-nourished, and in no distress.   HENT: Head: Normocephalic and atraumatic.  Mouth/Throat: No oropharyngeal exudate. Mucosa moist. Eyes: Pupils are equal, round, and reactive to light. Conjunctivae are normal. No scleral icterus.  Neck: Normal range of motion. Neck supple. No JVD present.  Cardiovascular: Normal rate, regular rhythm and normal heart sounds.  Exam reveals no gallop and no friction rub.   No murmur heard. Pulmonary/Chest: Effort normal and breath sounds normal. No respiratory distress. No wheezes.No rales.  Abdominal: Soft. Bowel sounds are normal. No distension. There is no tenderness. There is no guarding.  Musculoskeletal: No edema or tenderness.   Lymphadenopathy: No cervical, axillary or supraclavicular adenopathy.  Neurological: Alert and oriented to person, place, and time. No cranial nerve deficit.  Skin: Skin is warm and dry. No rash noted. No erythema. No pallor.  Psychiatric: Affect and judgment normal.   Labs Appointment on 07/13/2018  Component Date Value Ref Range Status  . WBC 07/13/2018 5.8  4.0 - 10.5 K/uL Final  . RBC 07/13/2018 3.75* 3.87 - 5.11 MIL/uL Final  . Hemoglobin 07/13/2018 12.6  12.0 - 15.0 g/dL Final  . HCT 07/13/2018 38.7  36.0 - 46.0 % Final  . MCV 07/13/2018 103.2* 78.0 - 100.0 fL Final  . MCH 07/13/2018 33.6  26.0 - 34.0 pg Final  . MCHC 07/13/2018 32.6  30.0 - 36.0 g/dL Final  . RDW 07/13/2018 14.5  11.5 - 15.5 % Final  . Platelets 07/13/2018 328  150 - 400 K/uL Final  . Neutrophils Relative % 07/13/2018 61  % Final  . Neutro Abs 07/13/2018 3.6  1.7 - 7.7 K/uL Final  . Lymphocytes Relative 07/13/2018 26  % Final  . Lymphs Abs 07/13/2018 1.5  0.7 - 4.0 K/uL Final  . Monocytes Relative 07/13/2018 9  % Final  . Monocytes Absolute 07/13/2018 0.5  0.1 - 1.0 K/uL Final  . Eosinophils Relative 07/13/2018 3  % Final  . Eosinophils Absolute 07/13/2018 0.2  0.0 - 0.7 K/uL Final  . Basophils Relative 07/13/2018 1  % Final  . Basophils Absolute 07/13/2018 0.1  0.0 - 0.1 K/uL Final   Performed at [facility], [address] [place], [place] [address]  . Sodium 07/13/2018 138  135 - 145 mmol/L Final  . Potassium 07/13/2018 3.9  3.5 - 5.1 mmol/L Final  . Chloride 07/13/2018 104  98 - 111 mmol/L Final  . CO2 07/13/2018 26  22 - 32 mmol/L Final  . Glucose, Bld 07/13/2018 86  70 - 99 mg/dL Final  . BUN 07/13/2018 17  8 - 23 mg/dL Final  . Creatinine, Ser 07/13/2018 0.59  0.44 - 1.00 mg/dL Final  . Calcium 07/13/2018 8.6* 8.9 - 10.3 mg/dL Final  . Total Protein 07/13/2018 7.1  6.5 - 8.1 g/dL Final  . Albumin 07/13/2018 4.0  3.5 - 5.0 g/dL Final  . AST 07/13/2018 25  15 - 41 U/L Final  . ALT 07/13/2018 15   0 - 44 U/L Final  . Alkaline Phosphatase 07/13/2018 74  38 - 126 U/L Final  . Total Bilirubin 07/13/2018 0.7  0.3 - 1.2 mg/dL Final  . GFR calc non Af Amer 07/13/2018 >60  >60 mL/min Final  . GFR calc Af Amer 07/13/2018 >60  >60 mL/min Final   Comment: (NOTE) The eGFR has been calculated using the CKD EPI equation. This calculation has not been validated in all clinical situations. eGFR's persistently <60 mL/min  signify possible Chronic Kidney Disease.   Georgiann Hahn gap 07/13/2018 8  5 - 15 Final   Performed at [facility], [address], [place], [place] [address]  . LDH 07/13/2018 155  98 - 192 U/L Final   Performed at [facility], [address] [place], [place] [address]     Pathology Orders Placed This Encounter  Procedures  . CBC with Differential/Platelet    Standing Status:   Future    Standing Expiration Date:   07/14/2019  . Comprehensive metabolic panel    Standing Status:   Future    Standing Expiration Date:   07/14/2019  . Lactate dehydrogenase    Standing Status:   Future    Standing Expiration Date:   07/14/2019       Zoila Shutter MD

## 2018-08-13 ENCOUNTER — Ambulatory Visit: Payer: Medicare Other | Admitting: Obstetrics & Gynecology

## 2018-08-21 ENCOUNTER — Ambulatory Visit: Payer: Medicare Other | Admitting: Obstetrics & Gynecology

## 2018-09-07 ENCOUNTER — Ambulatory Visit (INDEPENDENT_AMBULATORY_CARE_PROVIDER_SITE_OTHER): Payer: Medicare Other | Admitting: Obstetrics & Gynecology

## 2018-09-07 ENCOUNTER — Other Ambulatory Visit: Payer: Self-pay

## 2018-09-07 ENCOUNTER — Encounter: Payer: Self-pay | Admitting: Obstetrics & Gynecology

## 2018-09-07 VITALS — BP 161/77 | HR 74 | Ht 64.0 in | Wt 132.0 lb

## 2018-09-07 DIAGNOSIS — Z4689 Encounter for fitting and adjustment of other specified devices: Secondary | ICD-10-CM

## 2018-09-07 NOTE — Progress Notes (Signed)
Chief Complaint  Patient presents with  . pessary maintenance    Blood pressure (!) 161/77, pulse 74, height 5\' 4"  (1.626 m), weight 132 lb (59.9 kg).  [name] presents today for routine follow up related to her pessary.   She uses a milex ring with support #3 However it is putting excessive pressure on the vaginal sidewall So I refit her for a milex ring with support #2 and placed it today, I had one in stock She reports little vaginal discharge or vaginal bleeding.  Exam reveals more vaginal mucosal pressure than previously as stated above, little discharge and little vaginal bleeding.  The pessary is removed, cleaned and replaced without difficulty.    [name] will be sen back in 3 months for continued follow up.  Florian Buff, MD  09/07/2018 3:12 PM

## 2018-09-21 DIAGNOSIS — D45 Polycythemia vera: Secondary | ICD-10-CM | POA: Diagnosis not present

## 2018-09-21 DIAGNOSIS — I4892 Unspecified atrial flutter: Secondary | ICD-10-CM | POA: Diagnosis not present

## 2018-09-21 DIAGNOSIS — D751 Secondary polycythemia: Secondary | ICD-10-CM | POA: Diagnosis not present

## 2018-09-21 DIAGNOSIS — Z9181 History of falling: Secondary | ICD-10-CM | POA: Diagnosis not present

## 2018-09-21 DIAGNOSIS — S72012A Unspecified intracapsular fracture of left femur, initial encounter for closed fracture: Secondary | ICD-10-CM | POA: Diagnosis not present

## 2018-09-21 DIAGNOSIS — S7292XA Unspecified fracture of left femur, initial encounter for closed fracture: Secondary | ICD-10-CM | POA: Diagnosis not present

## 2018-09-21 DIAGNOSIS — R000 Tachycardia, unspecified: Secondary | ICD-10-CM | POA: Diagnosis not present

## 2018-09-21 DIAGNOSIS — S72042A Displaced fracture of base of neck of left femur, initial encounter for closed fracture: Secondary | ICD-10-CM | POA: Diagnosis not present

## 2018-09-21 DIAGNOSIS — S72052A Unspecified fracture of head of left femur, initial encounter for closed fracture: Secondary | ICD-10-CM | POA: Diagnosis not present

## 2018-09-21 DIAGNOSIS — Z0000 Encounter for general adult medical examination without abnormal findings: Secondary | ICD-10-CM | POA: Diagnosis not present

## 2018-09-21 DIAGNOSIS — S72012D Unspecified intracapsular fracture of left femur, subsequent encounter for closed fracture with routine healing: Secondary | ICD-10-CM | POA: Diagnosis not present

## 2018-09-21 DIAGNOSIS — I499 Cardiac arrhythmia, unspecified: Secondary | ICD-10-CM | POA: Diagnosis not present

## 2018-09-21 DIAGNOSIS — I959 Hypotension, unspecified: Secondary | ICD-10-CM | POA: Diagnosis not present

## 2018-09-21 DIAGNOSIS — M25552 Pain in left hip: Secondary | ICD-10-CM | POA: Diagnosis not present

## 2018-09-21 DIAGNOSIS — B9620 Unspecified Escherichia coli [E. coli] as the cause of diseases classified elsewhere: Secondary | ICD-10-CM | POA: Diagnosis not present

## 2018-09-21 DIAGNOSIS — R2689 Other abnormalities of gait and mobility: Secondary | ICD-10-CM | POA: Diagnosis not present

## 2018-09-21 DIAGNOSIS — E785 Hyperlipidemia, unspecified: Secondary | ICD-10-CM | POA: Diagnosis not present

## 2018-09-21 DIAGNOSIS — M6281 Muscle weakness (generalized): Secondary | ICD-10-CM | POA: Diagnosis not present

## 2018-09-21 DIAGNOSIS — E876 Hypokalemia: Secondary | ICD-10-CM | POA: Diagnosis not present

## 2018-09-21 DIAGNOSIS — Z23 Encounter for immunization: Secondary | ICD-10-CM | POA: Diagnosis not present

## 2018-09-21 DIAGNOSIS — N390 Urinary tract infection, site not specified: Secondary | ICD-10-CM | POA: Diagnosis not present

## 2018-09-21 DIAGNOSIS — Z96642 Presence of left artificial hip joint: Secondary | ICD-10-CM | POA: Diagnosis not present

## 2018-09-21 DIAGNOSIS — Z6820 Body mass index (BMI) 20.0-20.9, adult: Secondary | ICD-10-CM | POA: Diagnosis not present

## 2018-09-21 DIAGNOSIS — I4891 Unspecified atrial fibrillation: Secondary | ICD-10-CM | POA: Diagnosis not present

## 2018-09-21 DIAGNOSIS — I10 Essential (primary) hypertension: Secondary | ICD-10-CM | POA: Diagnosis not present

## 2018-09-21 DIAGNOSIS — Z79899 Other long term (current) drug therapy: Secondary | ICD-10-CM | POA: Diagnosis not present

## 2018-09-21 DIAGNOSIS — Z471 Aftercare following joint replacement surgery: Secondary | ICD-10-CM | POA: Diagnosis not present

## 2018-09-21 DIAGNOSIS — W19XXXA Unspecified fall, initial encounter: Secondary | ICD-10-CM | POA: Diagnosis not present

## 2018-09-21 DIAGNOSIS — R531 Weakness: Secondary | ICD-10-CM | POA: Diagnosis not present

## 2018-09-21 DIAGNOSIS — S72002A Fracture of unspecified part of neck of left femur, initial encounter for closed fracture: Secondary | ICD-10-CM | POA: Diagnosis not present

## 2018-09-21 DIAGNOSIS — Z1389 Encounter for screening for other disorder: Secondary | ICD-10-CM | POA: Diagnosis not present

## 2018-09-24 DIAGNOSIS — S72002A Fracture of unspecified part of neck of left femur, initial encounter for closed fracture: Secondary | ICD-10-CM | POA: Diagnosis not present

## 2018-09-29 DIAGNOSIS — N390 Urinary tract infection, site not specified: Secondary | ICD-10-CM | POA: Diagnosis not present

## 2018-09-29 DIAGNOSIS — D45 Polycythemia vera: Secondary | ICD-10-CM | POA: Diagnosis not present

## 2018-09-29 DIAGNOSIS — S72012D Unspecified intracapsular fracture of left femur, subsequent encounter for closed fracture with routine healing: Secondary | ICD-10-CM | POA: Diagnosis not present

## 2018-09-29 DIAGNOSIS — M25552 Pain in left hip: Secondary | ICD-10-CM | POA: Diagnosis not present

## 2018-09-29 DIAGNOSIS — R2689 Other abnormalities of gait and mobility: Secondary | ICD-10-CM | POA: Diagnosis not present

## 2018-09-29 DIAGNOSIS — M1612 Unilateral primary osteoarthritis, left hip: Secondary | ICD-10-CM | POA: Diagnosis not present

## 2018-09-29 DIAGNOSIS — S72012A Unspecified intracapsular fracture of left femur, initial encounter for closed fracture: Secondary | ICD-10-CM | POA: Diagnosis not present

## 2018-09-29 DIAGNOSIS — Z9181 History of falling: Secondary | ICD-10-CM | POA: Diagnosis not present

## 2018-09-29 DIAGNOSIS — I10 Essential (primary) hypertension: Secondary | ICD-10-CM | POA: Diagnosis not present

## 2018-09-29 DIAGNOSIS — I499 Cardiac arrhythmia, unspecified: Secondary | ICD-10-CM | POA: Diagnosis not present

## 2018-09-29 DIAGNOSIS — M6281 Muscle weakness (generalized): Secondary | ICD-10-CM | POA: Diagnosis not present

## 2018-09-29 DIAGNOSIS — Z96642 Presence of left artificial hip joint: Secondary | ICD-10-CM | POA: Diagnosis not present

## 2018-09-29 DIAGNOSIS — B9620 Unspecified Escherichia coli [E. coli] as the cause of diseases classified elsewhere: Secondary | ICD-10-CM | POA: Diagnosis not present

## 2018-10-01 DIAGNOSIS — M1612 Unilateral primary osteoarthritis, left hip: Secondary | ICD-10-CM | POA: Diagnosis not present

## 2018-10-01 DIAGNOSIS — Z96642 Presence of left artificial hip joint: Secondary | ICD-10-CM | POA: Insufficient documentation

## 2018-10-06 ENCOUNTER — Other Ambulatory Visit (HOSPITAL_COMMUNITY): Payer: Medicare Other

## 2018-10-13 ENCOUNTER — Ambulatory Visit (HOSPITAL_COMMUNITY): Payer: Medicare Other | Admitting: Internal Medicine

## 2018-10-15 DIAGNOSIS — Z96642 Presence of left artificial hip joint: Secondary | ICD-10-CM | POA: Diagnosis not present

## 2018-10-20 DIAGNOSIS — I10 Essential (primary) hypertension: Secondary | ICD-10-CM | POA: Diagnosis not present

## 2018-10-20 DIAGNOSIS — D45 Polycythemia vera: Secondary | ICD-10-CM | POA: Diagnosis not present

## 2018-10-20 DIAGNOSIS — Z96642 Presence of left artificial hip joint: Secondary | ICD-10-CM | POA: Diagnosis not present

## 2018-10-20 DIAGNOSIS — I499 Cardiac arrhythmia, unspecified: Secondary | ICD-10-CM | POA: Diagnosis not present

## 2018-10-20 DIAGNOSIS — S72002D Fracture of unspecified part of neck of left femur, subsequent encounter for closed fracture with routine healing: Secondary | ICD-10-CM | POA: Diagnosis not present

## 2018-10-20 DIAGNOSIS — Z9181 History of falling: Secondary | ICD-10-CM | POA: Diagnosis not present

## 2018-10-20 DIAGNOSIS — M25552 Pain in left hip: Secondary | ICD-10-CM | POA: Diagnosis not present

## 2018-10-21 DIAGNOSIS — Z96642 Presence of left artificial hip joint: Secondary | ICD-10-CM | POA: Diagnosis not present

## 2018-10-21 DIAGNOSIS — Z9181 History of falling: Secondary | ICD-10-CM | POA: Diagnosis not present

## 2018-10-21 DIAGNOSIS — I10 Essential (primary) hypertension: Secondary | ICD-10-CM | POA: Diagnosis not present

## 2018-10-21 DIAGNOSIS — S72002D Fracture of unspecified part of neck of left femur, subsequent encounter for closed fracture with routine healing: Secondary | ICD-10-CM | POA: Diagnosis not present

## 2018-10-21 DIAGNOSIS — I499 Cardiac arrhythmia, unspecified: Secondary | ICD-10-CM | POA: Diagnosis not present

## 2018-10-21 DIAGNOSIS — D45 Polycythemia vera: Secondary | ICD-10-CM | POA: Diagnosis not present

## 2018-10-23 DIAGNOSIS — I499 Cardiac arrhythmia, unspecified: Secondary | ICD-10-CM | POA: Diagnosis not present

## 2018-10-23 DIAGNOSIS — Z9181 History of falling: Secondary | ICD-10-CM | POA: Diagnosis not present

## 2018-10-23 DIAGNOSIS — D45 Polycythemia vera: Secondary | ICD-10-CM | POA: Diagnosis not present

## 2018-10-23 DIAGNOSIS — S72002D Fracture of unspecified part of neck of left femur, subsequent encounter for closed fracture with routine healing: Secondary | ICD-10-CM | POA: Diagnosis not present

## 2018-10-23 DIAGNOSIS — I10 Essential (primary) hypertension: Secondary | ICD-10-CM | POA: Diagnosis not present

## 2018-10-23 DIAGNOSIS — Z96642 Presence of left artificial hip joint: Secondary | ICD-10-CM | POA: Diagnosis not present

## 2018-10-26 DIAGNOSIS — Z9181 History of falling: Secondary | ICD-10-CM | POA: Diagnosis not present

## 2018-10-26 DIAGNOSIS — I10 Essential (primary) hypertension: Secondary | ICD-10-CM | POA: Diagnosis not present

## 2018-10-26 DIAGNOSIS — I499 Cardiac arrhythmia, unspecified: Secondary | ICD-10-CM | POA: Diagnosis not present

## 2018-10-26 DIAGNOSIS — Z96642 Presence of left artificial hip joint: Secondary | ICD-10-CM | POA: Diagnosis not present

## 2018-10-26 DIAGNOSIS — S72002D Fracture of unspecified part of neck of left femur, subsequent encounter for closed fracture with routine healing: Secondary | ICD-10-CM | POA: Diagnosis not present

## 2018-10-26 DIAGNOSIS — D45 Polycythemia vera: Secondary | ICD-10-CM | POA: Diagnosis not present

## 2018-10-27 DIAGNOSIS — S72002D Fracture of unspecified part of neck of left femur, subsequent encounter for closed fracture with routine healing: Secondary | ICD-10-CM | POA: Diagnosis not present

## 2018-10-27 DIAGNOSIS — Z9181 History of falling: Secondary | ICD-10-CM | POA: Diagnosis not present

## 2018-10-27 DIAGNOSIS — I499 Cardiac arrhythmia, unspecified: Secondary | ICD-10-CM | POA: Diagnosis not present

## 2018-10-27 DIAGNOSIS — Z96642 Presence of left artificial hip joint: Secondary | ICD-10-CM | POA: Diagnosis not present

## 2018-10-27 DIAGNOSIS — I10 Essential (primary) hypertension: Secondary | ICD-10-CM | POA: Diagnosis not present

## 2018-10-27 DIAGNOSIS — D45 Polycythemia vera: Secondary | ICD-10-CM | POA: Diagnosis not present

## 2018-10-28 DIAGNOSIS — Z96642 Presence of left artificial hip joint: Secondary | ICD-10-CM | POA: Diagnosis not present

## 2018-10-28 DIAGNOSIS — I10 Essential (primary) hypertension: Secondary | ICD-10-CM | POA: Diagnosis not present

## 2018-10-28 DIAGNOSIS — I499 Cardiac arrhythmia, unspecified: Secondary | ICD-10-CM | POA: Diagnosis not present

## 2018-10-28 DIAGNOSIS — D45 Polycythemia vera: Secondary | ICD-10-CM | POA: Diagnosis not present

## 2018-10-28 DIAGNOSIS — Z9181 History of falling: Secondary | ICD-10-CM | POA: Diagnosis not present

## 2018-10-28 DIAGNOSIS — S72002D Fracture of unspecified part of neck of left femur, subsequent encounter for closed fracture with routine healing: Secondary | ICD-10-CM | POA: Diagnosis not present

## 2018-10-29 DIAGNOSIS — Z6820 Body mass index (BMI) 20.0-20.9, adult: Secondary | ICD-10-CM | POA: Diagnosis not present

## 2018-10-29 DIAGNOSIS — I10 Essential (primary) hypertension: Secondary | ICD-10-CM | POA: Diagnosis not present

## 2018-10-29 DIAGNOSIS — Z96642 Presence of left artificial hip joint: Secondary | ICD-10-CM | POA: Diagnosis not present

## 2018-10-29 DIAGNOSIS — I499 Cardiac arrhythmia, unspecified: Secondary | ICD-10-CM | POA: Diagnosis not present

## 2018-10-29 DIAGNOSIS — S72002D Fracture of unspecified part of neck of left femur, subsequent encounter for closed fracture with routine healing: Secondary | ICD-10-CM | POA: Diagnosis not present

## 2018-10-29 DIAGNOSIS — D45 Polycythemia vera: Secondary | ICD-10-CM | POA: Diagnosis not present

## 2018-10-29 DIAGNOSIS — Z9181 History of falling: Secondary | ICD-10-CM | POA: Diagnosis not present

## 2018-11-02 DIAGNOSIS — I499 Cardiac arrhythmia, unspecified: Secondary | ICD-10-CM | POA: Diagnosis not present

## 2018-11-02 DIAGNOSIS — I10 Essential (primary) hypertension: Secondary | ICD-10-CM | POA: Diagnosis not present

## 2018-11-02 DIAGNOSIS — Z96642 Presence of left artificial hip joint: Secondary | ICD-10-CM | POA: Diagnosis not present

## 2018-11-02 DIAGNOSIS — D45 Polycythemia vera: Secondary | ICD-10-CM | POA: Diagnosis not present

## 2018-11-02 DIAGNOSIS — S72002D Fracture of unspecified part of neck of left femur, subsequent encounter for closed fracture with routine healing: Secondary | ICD-10-CM | POA: Diagnosis not present

## 2018-11-02 DIAGNOSIS — Z9181 History of falling: Secondary | ICD-10-CM | POA: Diagnosis not present

## 2018-11-03 DIAGNOSIS — Z96642 Presence of left artificial hip joint: Secondary | ICD-10-CM | POA: Diagnosis not present

## 2018-11-03 DIAGNOSIS — Z9181 History of falling: Secondary | ICD-10-CM | POA: Diagnosis not present

## 2018-11-03 DIAGNOSIS — I499 Cardiac arrhythmia, unspecified: Secondary | ICD-10-CM | POA: Diagnosis not present

## 2018-11-03 DIAGNOSIS — D45 Polycythemia vera: Secondary | ICD-10-CM | POA: Diagnosis not present

## 2018-11-03 DIAGNOSIS — I10 Essential (primary) hypertension: Secondary | ICD-10-CM | POA: Diagnosis not present

## 2018-11-03 DIAGNOSIS — S72002D Fracture of unspecified part of neck of left femur, subsequent encounter for closed fracture with routine healing: Secondary | ICD-10-CM | POA: Diagnosis not present

## 2018-11-04 DIAGNOSIS — D45 Polycythemia vera: Secondary | ICD-10-CM | POA: Diagnosis not present

## 2018-11-04 DIAGNOSIS — Z9181 History of falling: Secondary | ICD-10-CM | POA: Diagnosis not present

## 2018-11-04 DIAGNOSIS — Z96642 Presence of left artificial hip joint: Secondary | ICD-10-CM | POA: Diagnosis not present

## 2018-11-04 DIAGNOSIS — I10 Essential (primary) hypertension: Secondary | ICD-10-CM | POA: Diagnosis not present

## 2018-11-04 DIAGNOSIS — I499 Cardiac arrhythmia, unspecified: Secondary | ICD-10-CM | POA: Diagnosis not present

## 2018-11-04 DIAGNOSIS — S72002D Fracture of unspecified part of neck of left femur, subsequent encounter for closed fracture with routine healing: Secondary | ICD-10-CM | POA: Diagnosis not present

## 2018-11-05 DIAGNOSIS — S72002D Fracture of unspecified part of neck of left femur, subsequent encounter for closed fracture with routine healing: Secondary | ICD-10-CM | POA: Diagnosis not present

## 2018-11-05 DIAGNOSIS — I499 Cardiac arrhythmia, unspecified: Secondary | ICD-10-CM | POA: Diagnosis not present

## 2018-11-05 DIAGNOSIS — M25552 Pain in left hip: Secondary | ICD-10-CM | POA: Diagnosis not present

## 2018-11-05 DIAGNOSIS — Z9181 History of falling: Secondary | ICD-10-CM | POA: Diagnosis not present

## 2018-11-05 DIAGNOSIS — Z96642 Presence of left artificial hip joint: Secondary | ICD-10-CM | POA: Diagnosis not present

## 2018-11-05 DIAGNOSIS — I10 Essential (primary) hypertension: Secondary | ICD-10-CM | POA: Diagnosis not present

## 2018-11-05 DIAGNOSIS — D45 Polycythemia vera: Secondary | ICD-10-CM | POA: Diagnosis not present

## 2018-11-09 DIAGNOSIS — S72002D Fracture of unspecified part of neck of left femur, subsequent encounter for closed fracture with routine healing: Secondary | ICD-10-CM | POA: Diagnosis not present

## 2018-11-09 DIAGNOSIS — I10 Essential (primary) hypertension: Secondary | ICD-10-CM | POA: Diagnosis not present

## 2018-11-09 DIAGNOSIS — Z9181 History of falling: Secondary | ICD-10-CM | POA: Diagnosis not present

## 2018-11-09 DIAGNOSIS — D45 Polycythemia vera: Secondary | ICD-10-CM | POA: Diagnosis not present

## 2018-11-09 DIAGNOSIS — I499 Cardiac arrhythmia, unspecified: Secondary | ICD-10-CM | POA: Diagnosis not present

## 2018-11-09 DIAGNOSIS — Z96642 Presence of left artificial hip joint: Secondary | ICD-10-CM | POA: Diagnosis not present

## 2018-11-10 DIAGNOSIS — Z9181 History of falling: Secondary | ICD-10-CM | POA: Diagnosis not present

## 2018-11-10 DIAGNOSIS — Z96642 Presence of left artificial hip joint: Secondary | ICD-10-CM | POA: Diagnosis not present

## 2018-11-10 DIAGNOSIS — I499 Cardiac arrhythmia, unspecified: Secondary | ICD-10-CM | POA: Diagnosis not present

## 2018-11-10 DIAGNOSIS — D45 Polycythemia vera: Secondary | ICD-10-CM | POA: Diagnosis not present

## 2018-11-10 DIAGNOSIS — S72002D Fracture of unspecified part of neck of left femur, subsequent encounter for closed fracture with routine healing: Secondary | ICD-10-CM | POA: Diagnosis not present

## 2018-11-10 DIAGNOSIS — I10 Essential (primary) hypertension: Secondary | ICD-10-CM | POA: Diagnosis not present

## 2018-11-13 DIAGNOSIS — S72002D Fracture of unspecified part of neck of left femur, subsequent encounter for closed fracture with routine healing: Secondary | ICD-10-CM | POA: Diagnosis not present

## 2018-11-13 DIAGNOSIS — I499 Cardiac arrhythmia, unspecified: Secondary | ICD-10-CM | POA: Diagnosis not present

## 2018-11-13 DIAGNOSIS — I10 Essential (primary) hypertension: Secondary | ICD-10-CM | POA: Diagnosis not present

## 2018-11-13 DIAGNOSIS — Z96642 Presence of left artificial hip joint: Secondary | ICD-10-CM | POA: Diagnosis not present

## 2018-11-13 DIAGNOSIS — D45 Polycythemia vera: Secondary | ICD-10-CM | POA: Diagnosis not present

## 2018-11-13 DIAGNOSIS — Z9181 History of falling: Secondary | ICD-10-CM | POA: Diagnosis not present

## 2018-11-16 DIAGNOSIS — Z9181 History of falling: Secondary | ICD-10-CM | POA: Diagnosis not present

## 2018-11-16 DIAGNOSIS — D45 Polycythemia vera: Secondary | ICD-10-CM | POA: Diagnosis not present

## 2018-11-16 DIAGNOSIS — I10 Essential (primary) hypertension: Secondary | ICD-10-CM | POA: Diagnosis not present

## 2018-11-16 DIAGNOSIS — Z96642 Presence of left artificial hip joint: Secondary | ICD-10-CM | POA: Diagnosis not present

## 2018-11-16 DIAGNOSIS — S72002D Fracture of unspecified part of neck of left femur, subsequent encounter for closed fracture with routine healing: Secondary | ICD-10-CM | POA: Diagnosis not present

## 2018-11-16 DIAGNOSIS — I499 Cardiac arrhythmia, unspecified: Secondary | ICD-10-CM | POA: Diagnosis not present

## 2018-11-18 DIAGNOSIS — D45 Polycythemia vera: Secondary | ICD-10-CM | POA: Diagnosis not present

## 2018-11-18 DIAGNOSIS — I499 Cardiac arrhythmia, unspecified: Secondary | ICD-10-CM | POA: Diagnosis not present

## 2018-11-18 DIAGNOSIS — Z9181 History of falling: Secondary | ICD-10-CM | POA: Diagnosis not present

## 2018-11-18 DIAGNOSIS — I10 Essential (primary) hypertension: Secondary | ICD-10-CM | POA: Diagnosis not present

## 2018-11-18 DIAGNOSIS — S72002D Fracture of unspecified part of neck of left femur, subsequent encounter for closed fracture with routine healing: Secondary | ICD-10-CM | POA: Diagnosis not present

## 2018-11-18 DIAGNOSIS — Z96642 Presence of left artificial hip joint: Secondary | ICD-10-CM | POA: Diagnosis not present

## 2018-11-23 DIAGNOSIS — Z9181 History of falling: Secondary | ICD-10-CM | POA: Diagnosis not present

## 2018-11-23 DIAGNOSIS — Z96642 Presence of left artificial hip joint: Secondary | ICD-10-CM | POA: Diagnosis not present

## 2018-11-23 DIAGNOSIS — I499 Cardiac arrhythmia, unspecified: Secondary | ICD-10-CM | POA: Diagnosis not present

## 2018-11-23 DIAGNOSIS — D45 Polycythemia vera: Secondary | ICD-10-CM | POA: Diagnosis not present

## 2018-11-23 DIAGNOSIS — I10 Essential (primary) hypertension: Secondary | ICD-10-CM | POA: Diagnosis not present

## 2018-11-23 DIAGNOSIS — S72002D Fracture of unspecified part of neck of left femur, subsequent encounter for closed fracture with routine healing: Secondary | ICD-10-CM | POA: Diagnosis not present

## 2018-12-03 ENCOUNTER — Ambulatory Visit: Payer: Medicare Other | Admitting: Obstetrics & Gynecology

## 2018-12-17 DIAGNOSIS — M25552 Pain in left hip: Secondary | ICD-10-CM | POA: Diagnosis not present

## 2018-12-17 DIAGNOSIS — Z96642 Presence of left artificial hip joint: Secondary | ICD-10-CM | POA: Diagnosis not present

## 2018-12-23 ENCOUNTER — Inpatient Hospital Stay (HOSPITAL_COMMUNITY): Payer: Medicare Other | Attending: Hematology

## 2018-12-23 DIAGNOSIS — I10 Essential (primary) hypertension: Secondary | ICD-10-CM | POA: Diagnosis not present

## 2018-12-23 DIAGNOSIS — Z79899 Other long term (current) drug therapy: Secondary | ICD-10-CM | POA: Diagnosis not present

## 2018-12-23 DIAGNOSIS — Z780 Asymptomatic menopausal state: Secondary | ICD-10-CM | POA: Diagnosis not present

## 2018-12-23 DIAGNOSIS — D45 Polycythemia vera: Secondary | ICD-10-CM | POA: Insufficient documentation

## 2018-12-23 DIAGNOSIS — D7589 Other specified diseases of blood and blood-forming organs: Secondary | ICD-10-CM | POA: Insufficient documentation

## 2018-12-23 LAB — COMPREHENSIVE METABOLIC PANEL
ALT: 16 U/L (ref 0–44)
AST: 21 U/L (ref 15–41)
Albumin: 3.9 g/dL (ref 3.5–5.0)
Alkaline Phosphatase: 77 U/L (ref 38–126)
Anion gap: 9 (ref 5–15)
BILIRUBIN TOTAL: 1 mg/dL (ref 0.3–1.2)
BUN: 18 mg/dL (ref 8–23)
CHLORIDE: 102 mmol/L (ref 98–111)
CO2: 25 mmol/L (ref 22–32)
CREATININE: 0.72 mg/dL (ref 0.44–1.00)
Calcium: 8.8 mg/dL — ABNORMAL LOW (ref 8.9–10.3)
Glucose, Bld: 134 mg/dL — ABNORMAL HIGH (ref 70–99)
POTASSIUM: 3.8 mmol/L (ref 3.5–5.1)
SODIUM: 136 mmol/L (ref 135–145)
TOTAL PROTEIN: 7 g/dL (ref 6.5–8.1)

## 2018-12-23 LAB — CBC WITH DIFFERENTIAL/PLATELET
ABS IMMATURE GRANULOCYTES: 0.01 10*3/uL (ref 0.00–0.07)
BASOS PCT: 1 %
Basophils Absolute: 0.1 10*3/uL (ref 0.0–0.1)
Eosinophils Absolute: 0.2 10*3/uL (ref 0.0–0.5)
Eosinophils Relative: 2 %
HCT: 38.3 % (ref 36.0–46.0)
Hemoglobin: 11.8 g/dL — ABNORMAL LOW (ref 12.0–15.0)
IMMATURE GRANULOCYTES: 0 %
Lymphocytes Relative: 15 %
Lymphs Abs: 1 10*3/uL (ref 0.7–4.0)
MCH: 31.7 pg (ref 26.0–34.0)
MCHC: 30.8 g/dL (ref 30.0–36.0)
MCV: 103 fL — ABNORMAL HIGH (ref 80.0–100.0)
Monocytes Absolute: 0.6 10*3/uL (ref 0.1–1.0)
Monocytes Relative: 10 %
NEUTROS ABS: 4.6 10*3/uL (ref 1.7–7.7)
NEUTROS PCT: 72 %
NRBC: 0 % (ref 0.0–0.2)
PLATELETS: 370 10*3/uL (ref 150–400)
RBC: 3.72 MIL/uL — ABNORMAL LOW (ref 3.87–5.11)
RDW: 15.2 % (ref 11.5–15.5)
WBC: 6.4 10*3/uL (ref 4.0–10.5)

## 2018-12-23 LAB — LACTATE DEHYDROGENASE: LDH: 140 U/L (ref 98–192)

## 2018-12-28 ENCOUNTER — Inpatient Hospital Stay (HOSPITAL_BASED_OUTPATIENT_CLINIC_OR_DEPARTMENT_OTHER): Payer: Medicare Other | Admitting: Internal Medicine

## 2018-12-28 ENCOUNTER — Other Ambulatory Visit: Payer: Self-pay

## 2018-12-28 VITALS — BP 124/89 | HR 77 | Temp 98.2°F | Resp 16 | Wt 122.3 lb

## 2018-12-28 DIAGNOSIS — Z8781 Personal history of (healed) traumatic fracture: Secondary | ICD-10-CM | POA: Diagnosis not present

## 2018-12-28 DIAGNOSIS — D7589 Other specified diseases of blood and blood-forming organs: Secondary | ICD-10-CM | POA: Diagnosis not present

## 2018-12-28 DIAGNOSIS — Z780 Asymptomatic menopausal state: Secondary | ICD-10-CM

## 2018-12-28 DIAGNOSIS — I10 Essential (primary) hypertension: Secondary | ICD-10-CM | POA: Diagnosis not present

## 2018-12-28 DIAGNOSIS — Z79899 Other long term (current) drug therapy: Secondary | ICD-10-CM | POA: Diagnosis not present

## 2018-12-28 DIAGNOSIS — D45 Polycythemia vera: Secondary | ICD-10-CM | POA: Diagnosis not present

## 2018-12-28 NOTE — Patient Instructions (Signed)
[facility]  Discharge Instructions: You saw Dr. Higgs today                               _______________________________________________________________  Thank you for choosing [facility] to provide your oncology and hematology care.  To afford each patient quality time with our providers, please arrive at least 15 minutes before your scheduled appointment.  You need to re-schedule your appointment if you arrive 10 or more minutes late.  We strive to give you quality time with our providers, and arriving late affects you and other patients whose appointments are after yours.  Also, if you no show three or more times for appointments you may be dismissed from the clinic.  Again, thank you for choosing  [facility]. Our hope is that these requests will allow you access to exceptional care and in a timely manner. _______________________________________________________________  If you have questions after your visit, please contact our office at [phone] between the hours of 8:30 a.m. and 5:00 p.m. Voicemails left after 4:30 p.m. will not be returned until the following business day. _______________________________________________________________  For prescription refill requests, have your pharmacy contact our office. _______________________________________________________________  Recommendations made by the consultant and any test results will be sent to your referring physician. _______________________________________________________________ 

## 2018-12-28 NOTE — Progress Notes (Signed)
Diagnosis Polycythemia vera ([place]) - Plan: CBC with Differential/Platelet, Comprehensive metabolic panel, Lactate dehydrogenase, DG Bone Density  Postmenopausal - Plan: CBC with Differential/Platelet, Comprehensive metabolic panel, Lactate dehydrogenase, DG Bone Density  Staging Cancer Staging No matching staging information was found for the patient.  Assessment and Plan:   1.  Polycythemia vera ([place]).  Patient on  Hydrea 500 mg 4 times a week.  Labs done 12/23/2018 reviewed and showed WBC 6.4 HB 11.8 plts 370,000.  Chemistries WNL with K+ 3.8 Cr 0.72 and normal LFTs.   She will continue Hydrea at present dose.  She will follow-up in 06/2019 with labs.  Rx for Hydrea # 90 with 3 refills sent to pharmacy.    2.  Left hip fracture.  Pt reports this occurred in October 2019 after a fall while she was wearing Crocs.  She has undergone surgery.  Review of chart shows last Dexa was done in 2015.  She is set up for Dexa and if consistent with osteoporosis she has option of Prolia 60 mg every 6 months.  Pt should continue calcium and vitamin D.    3.  Hypertension.  BP is 124/89.  Follow-up with PCP.    4.  Macrocytosis.  Due to Hydrea.  MCV 103.    5.  Health maintenance.  Continue mammogram screening as recommended.  Follow-up with GI and PCP as recommended.    25 minutes spent with more than 50% spent in counseling and coordination of care.    CURRENT THERAPY: Hydrea 500 mg 4 times weekly  INTERVAL HISTORY: 83  y.o. female with Polycythemia vera, JAK2 V617F POSITIVE, diagnosed in August 2005.  On Hydrea 500 mg 4 times weekly.    CURRENT STATUS:  Patient is seen today for follow-up.  She had hip fracture in October.  She reports she was told in the past she has osteoporosis.  She was only taking Calcium and Vitamin D.  She reports she was unable to tolerate Fosamax.    Problem List Patient Active Problem List   Diagnosis Date Noted  . Uterovaginal prolapse, complete [N81.3] 10/12/2013  .  Vaginitis and vulvovaginitis, unspecified [N76.0] 07/05/2013  . Polycythemia vera ([place]) [D45] 02/25/2013    Past Medical History Past Medical History:  Diagnosis Date  . Hypertension   . Influenza 11/2012  . Osteoporosis   . Polycythemia   . Polycythemia vera(238.4) 02/25/2013   Diagnosed August 2005 on Hydrea 500 mg 3 times a week with excellent control.   . Prolapse, uterus, congenital    ring placement    Past Surgical History Past Surgical History:  Procedure Laterality Date  . APPENDECTOMY    . COLONOSCOPY    . OOPHORECTOMY     left  . opperectomy      Family History Family History  Problem Relation Age of Onset  . Cancer Brother        throat cancer  . Cancer Brother        lung cancer  . Cancer Brother        prostate cancer     Social History  reports that she has never smoked. She has never used smokeless tobacco. She reports that she does not drink alcohol or use drugs.  Medications  Current Outpatient Medications:  .  amLODipine (NORVASC) 5 MG tablet, Take 5 mg by mouth daily. , Disp: , Rfl:  .  aspirin 81 MG tablet, Take 81 mg by mouth daily.  , Disp: , Rfl:  .  Calcium Carbonate-Vitamin D (CALCIUM + D PO), Take 2,000 mg by mouth 2 (two) times daily.  , Disp: , Rfl:  .  diltiazem (CARDIZEM CD) 180 MG 24 hr capsule, Take 180 mg by mouth daily. , Disp: , Rfl:  .  hydroxyurea (HYDREA) 500 MG capsule, TAKE ONE CAPSULE BY MOUTH ON MONDAY, WEDNESDAY & FRIDAY AND SATURDAY., Disp: 48 capsule, Rfl: 0  Allergies Codeine; Diovan [valsartan]; Penicillins; Ramipril; and Ramipril  Review of Systems Review of Systems - Oncology ROS negative    Physical Exam  Vitals Wt Readings from Last 3 Encounters:  12/28/18 122 lb 4.8 oz (55.5 kg)  09/07/18 132 lb (59.9 kg)  07/13/18 125 lb (56.7 kg)   Temp Readings from Last 3 Encounters:  12/28/18 98.2 F (36.8 C) (Oral)  07/13/18 97.9 F (36.6 C) (Oral)  07/07/17 98.1 F (36.7 C) (Oral)   BP Readings from  Last 3 Encounters:  12/28/18 124/89  09/07/18 (!) 161/77  07/13/18 (!) 168/54   Pulse Readings from Last 3 Encounters:  12/28/18 77  09/07/18 74  07/13/18 66   Constitutional: Well-developed, well-nourished, and in no distress.   HENT: Head: Normocephalic and atraumatic.  Mouth/Throat: No oropharyngeal exudate. Mucosa moist. Eyes: Pupils are equal, round, and reactive to light. Conjunctivae are normal. No scleral icterus.  Neck: Normal range of motion. Neck supple. No JVD present.  Cardiovascular: Normal rate, regular rhythm and normal heart sounds.  Exam reveals no gallop and no friction rub.   No murmur heard. Pulmonary/Chest: Effort normal and breath sounds normal. No respiratory distress. No wheezes.No rales.  Abdominal: Soft. Bowel sounds are normal. No distension. There is no tenderness. There is no guarding.  Musculoskeletal: Pt ambulates with cane.   Lymphadenopathy: No cervical, axillary or supraclavicular adenopathy.  Neurological: Alert and oriented to person, place, and time. No cranial nerve deficit.  Skin: Skin is warm and dry. No rash noted. No erythema. No pallor.  Psychiatric: Affect and judgment normal.   Labs No visits with results within 3 Day(s) from this visit.  Latest known visit with results is:  Appointment on 12/23/2018  Component Date Value Ref Range Status  . WBC 12/23/2018 6.4  4.0 - 10.5 K/uL Final  . RBC 12/23/2018 3.72* 3.87 - 5.11 MIL/uL Final  . Hemoglobin 12/23/2018 11.8* 12.0 - 15.0 g/dL Final  . HCT 12/23/2018 38.3  36.0 - 46.0 % Final  . MCV 12/23/2018 103.0* 80.0 - 100.0 fL Final  . MCH 12/23/2018 31.7  26.0 - 34.0 pg Final  . MCHC 12/23/2018 30.8  30.0 - 36.0 g/dL Final  . RDW 12/23/2018 15.2  11.5 - 15.5 % Final  . Platelets 12/23/2018 370  150 - 400 K/uL Final  . nRBC 12/23/2018 0.0  0.0 - 0.2 % Final  . Neutrophils Relative % 12/23/2018 72  % Final  . Neutro Abs 12/23/2018 4.6  1.7 - 7.7 K/uL Final  . Lymphocytes Relative 12/23/2018  15  % Final  . Lymphs Abs 12/23/2018 1.0  0.7 - 4.0 K/uL Final  . Monocytes Relative 12/23/2018 10  % Final  . Monocytes Absolute 12/23/2018 0.6  0.1 - 1.0 K/uL Final  . Eosinophils Relative 12/23/2018 2  % Final  . Eosinophils Absolute 12/23/2018 0.2  0.0 - 0.5 K/uL Final  . Basophils Relative 12/23/2018 1  % Final  . Basophils Absolute 12/23/2018 0.1  0.0 - 0.1 K/uL Final  . Immature Granulocytes 12/23/2018 0  % Final  . Abs Immature Granulocytes 12/23/2018 0.01  0.00 - 0.07 K/uL Final   Performed at [facility], [address] [place], [place] [address]  . Sodium 12/23/2018 136  135 - 145 mmol/L Final  . Potassium 12/23/2018 3.8  3.5 - 5.1 mmol/L Final  . Chloride 12/23/2018 102  98 - 111 mmol/L Final  . CO2 12/23/2018 25  22 - 32 mmol/L Final  . Glucose, Bld 12/23/2018 134* 70 - 99 mg/dL Final  . BUN 12/23/2018 18  8 - 23 mg/dL Final  . Creatinine, Ser 12/23/2018 0.72  0.44 - 1.00 mg/dL Final  . Calcium 12/23/2018 8.8* 8.9 - 10.3 mg/dL Final  . Total Protein 12/23/2018 7.0  6.5 - 8.1 g/dL Final  . Albumin 12/23/2018 3.9  3.5 - 5.0 g/dL Final  . AST 12/23/2018 21  15 - 41 U/L Final  . ALT 12/23/2018 16  0 - 44 U/L Final  . Alkaline Phosphatase 12/23/2018 77  38 - 126 U/L Final  . Total Bilirubin 12/23/2018 1.0  0.3 - 1.2 mg/dL Final  . GFR calc non Af Amer 12/23/2018 >60  >60 mL/min Final  . GFR calc Af Amer 12/23/2018 >60  >60 mL/min Final  . Anion gap 12/23/2018 9  5 - 15 Final   Performed at [facility], [address] [place], [place] [address]  . LDH 12/23/2018 140  98 - 192 U/L Final   Performed at [facility], [address], [place], [place] [address]     Pathology Orders Placed This Encounter  Procedures  . DG Bone Density    Standing Status:   Future    Standing Expiration Date:   12/28/2019    Order Specific Question:   Reason for Exam (SYMPTOM  OR DIAGNOSIS REQUIRED)    Answer:   postmenopausal    Order Specific Question:   Preferred imaging location?     Answer:   [facility]  . CBC with Differential/Platelet    Standing Status:   Future    Standing Expiration Date:   12/29/2019  . Comprehensive metabolic panel    Standing Status:   Future    Standing Expiration Date:   12/29/2019  . Lactate dehydrogenase    Standing Status:   Future    Standing Expiration Date:   12/29/2019       Zoila Shutter MD

## 2019-01-05 ENCOUNTER — Ambulatory Visit: Payer: Medicare Other | Admitting: Obstetrics & Gynecology

## 2019-01-07 ENCOUNTER — Other Ambulatory Visit (HOSPITAL_COMMUNITY): Payer: Self-pay | Admitting: Family Medicine

## 2019-01-07 ENCOUNTER — Inpatient Hospital Stay (HOSPITAL_COMMUNITY): Admission: RE | Admit: 2019-01-07 | Payer: Medicare Other | Source: Ambulatory Visit

## 2019-01-07 DIAGNOSIS — Z1231 Encounter for screening mammogram for malignant neoplasm of breast: Secondary | ICD-10-CM

## 2019-01-15 ENCOUNTER — Other Ambulatory Visit (HOSPITAL_COMMUNITY): Payer: Self-pay | Admitting: *Deleted

## 2019-01-15 DIAGNOSIS — D45 Polycythemia vera: Secondary | ICD-10-CM

## 2019-01-15 MED ORDER — HYDROXYUREA 500 MG PO CAPS: ORAL_CAPSULE | ORAL | 3 refills | Status: DC

## 2019-02-01 ENCOUNTER — Ambulatory Visit (HOSPITAL_COMMUNITY)
Admission: RE | Admit: 2019-02-01 | Discharge: 2019-02-01 | Disposition: A | Discharge status: Home / Self Care | Payer: Medicare Other | Source: Ambulatory Visit | Attending: Family Medicine | Admitting: Family Medicine

## 2019-02-01 ENCOUNTER — Ambulatory Visit (HOSPITAL_COMMUNITY)
Admission: RE | Admit: 2019-02-01 | Discharge: 2019-02-01 | Disposition: A | Discharge status: Home / Self Care | Payer: Medicare Other | Source: Ambulatory Visit | Attending: Internal Medicine | Admitting: Internal Medicine

## 2019-02-01 DIAGNOSIS — D45 Polycythemia vera: Secondary | ICD-10-CM | POA: Diagnosis not present

## 2019-02-01 DIAGNOSIS — M810 Age-related osteoporosis without current pathological fracture: Secondary | ICD-10-CM | POA: Diagnosis not present

## 2019-02-01 DIAGNOSIS — Z780 Asymptomatic menopausal state: Secondary | ICD-10-CM | POA: Insufficient documentation

## 2019-02-01 DIAGNOSIS — Z1231 Encounter for screening mammogram for malignant neoplasm of breast: Secondary | ICD-10-CM | POA: Insufficient documentation

## 2019-02-02 ENCOUNTER — Encounter: Payer: Self-pay | Admitting: Obstetrics & Gynecology

## 2019-02-02 ENCOUNTER — Ambulatory Visit (INDEPENDENT_AMBULATORY_CARE_PROVIDER_SITE_OTHER): Payer: Medicare Other | Admitting: Obstetrics & Gynecology

## 2019-02-02 VITALS — BP 144/82 | HR 78 | Ht 64.0 in | Wt 122.0 lb

## 2019-02-02 DIAGNOSIS — Z4689 Encounter for fitting and adjustment of other specified devices: Secondary | ICD-10-CM | POA: Diagnosis not present

## 2019-02-02 DIAGNOSIS — N813 Complete uterovaginal prolapse: Secondary | ICD-10-CM | POA: Diagnosis not present

## 2019-02-02 NOTE — Progress Notes (Signed)
Chief Complaint  Patient presents with  . Pessary Check    Blood pressure (!) 144/82, pulse 78, height 5\' 4"  (1.626 m), weight 122 lb (55.3 kg).  [name] presents today for routine follow up related to her pessary.   She uses a milex ring with support #2 She reports no vaginal discharge or vaginal bleeding.  Exam reveals no undue vaginal mucosal pressure of breakdown, no discharge and no vaginal bleeding.  The pessary is removed, cleaned and replaced without difficulty.    [name] will be sen back in 4 months for continued follow up.  Florian Buff, MD  02/02/2019 2:36 PM

## 2019-02-17 ENCOUNTER — Ambulatory Visit (HOSPITAL_COMMUNITY): Payer: Medicare Other

## 2019-02-17 ENCOUNTER — Other Ambulatory Visit (HOSPITAL_COMMUNITY): Payer: Medicare Other

## 2019-03-01 DIAGNOSIS — H5213 Myopia, bilateral: Secondary | ICD-10-CM | POA: Diagnosis not present

## 2019-03-03 IMAGING — MG DIGITAL SCREENING BILATERAL MAMMOGRAM WITH TOMO AND CAD
6 of 10 series · 6 of 30 positions shown · non-contrast
Comparison: Previous exam(s).

CLINICAL DATA: Screening.

EXAM:
DIGITAL SCREENING BILATERAL MAMMOGRAM WITH TOMO AND CAD

[R MLO synth-2D]
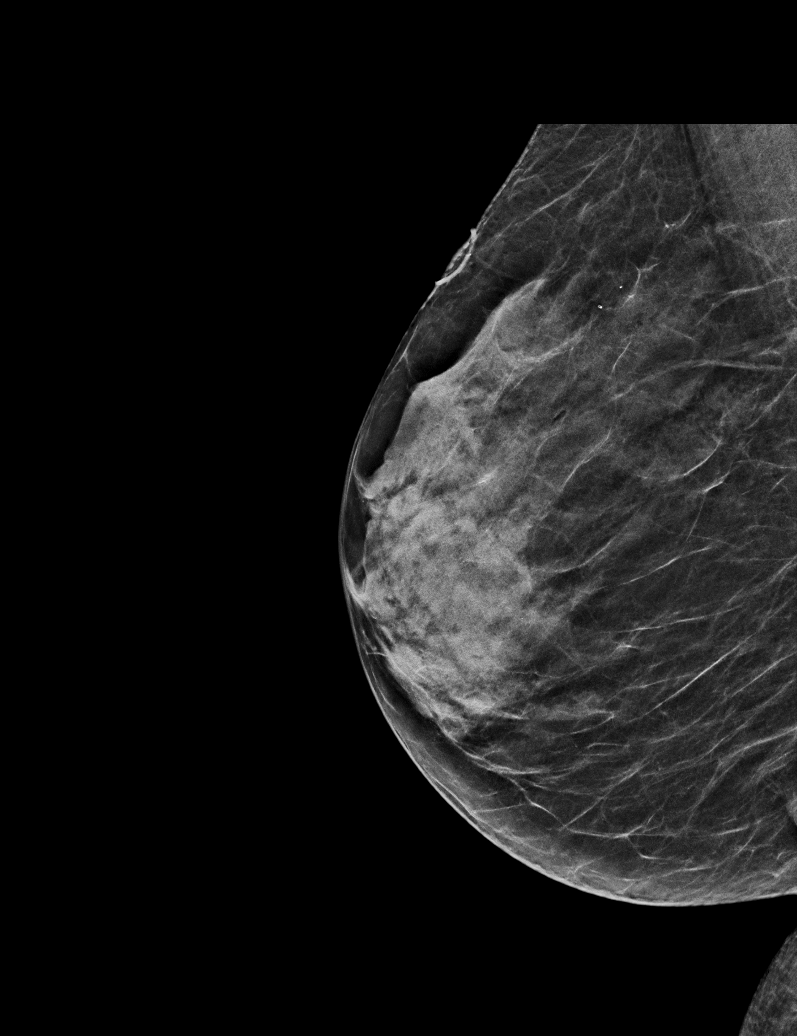

[L MLO synth-2D (1 of 2)]
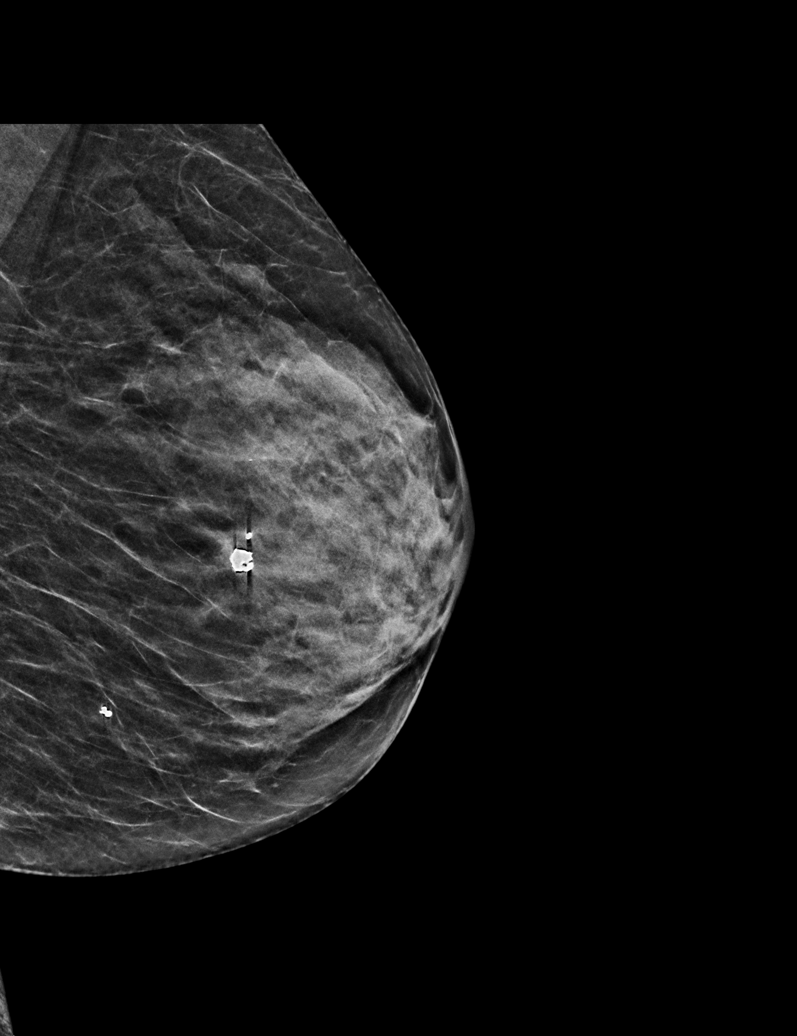

[L MLO synth-2D (2 of 2)]
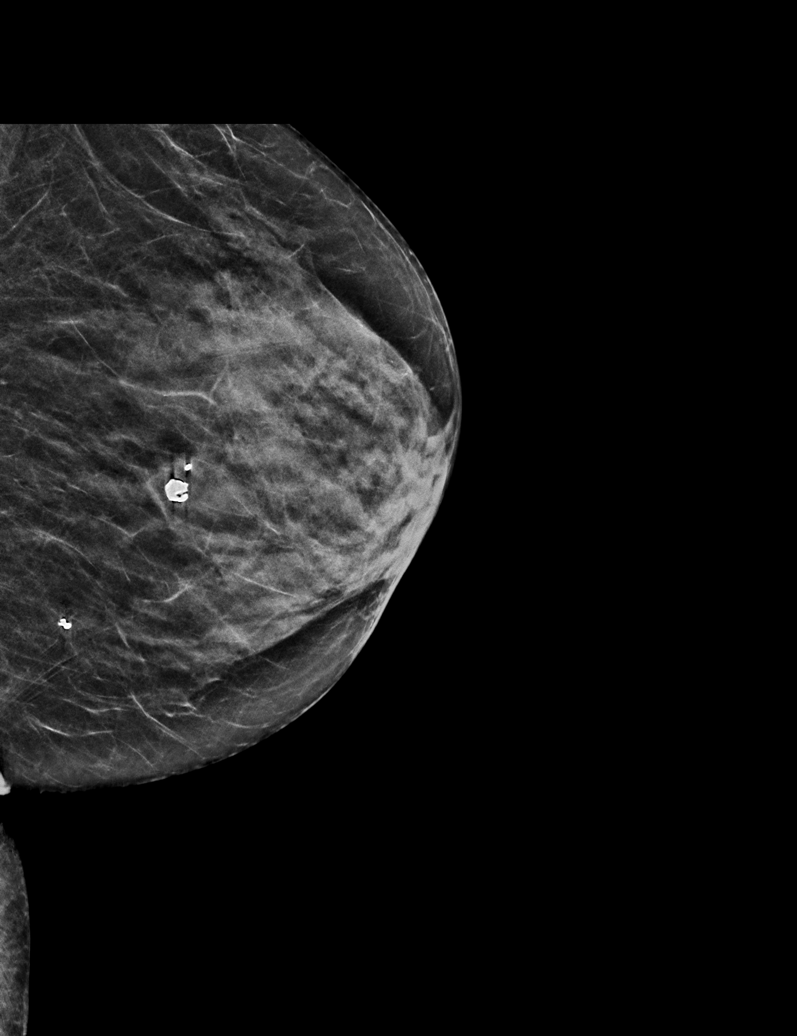

[L CC synth-2D]
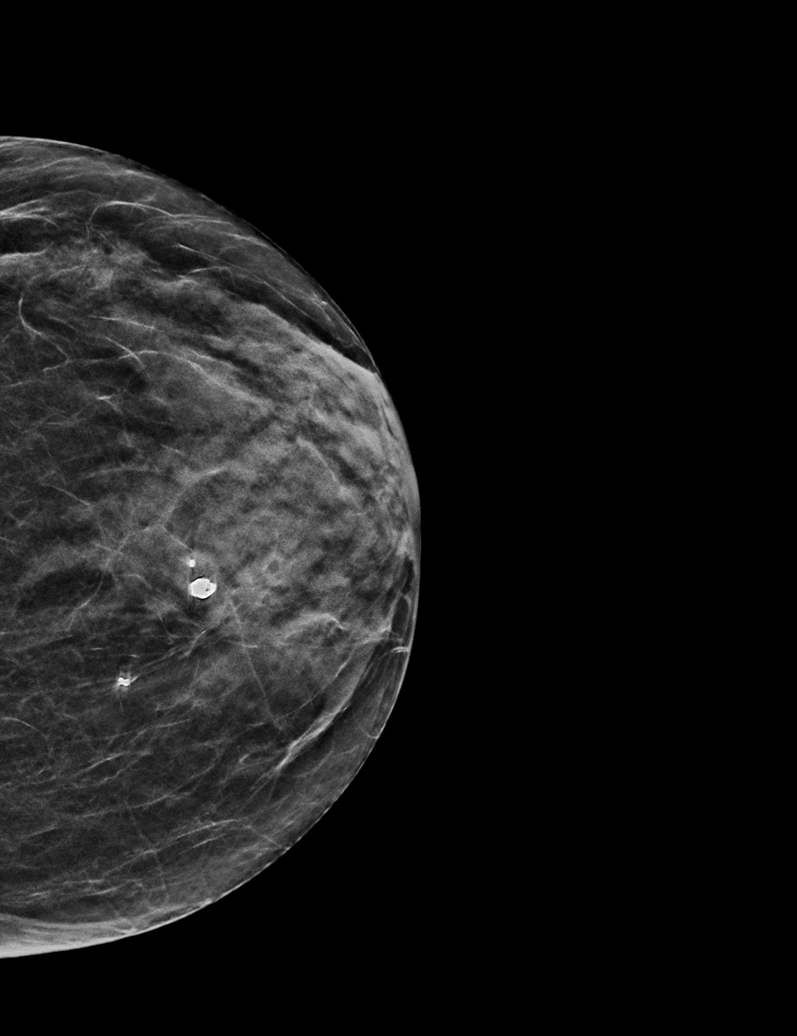

[R CC synth-2D]
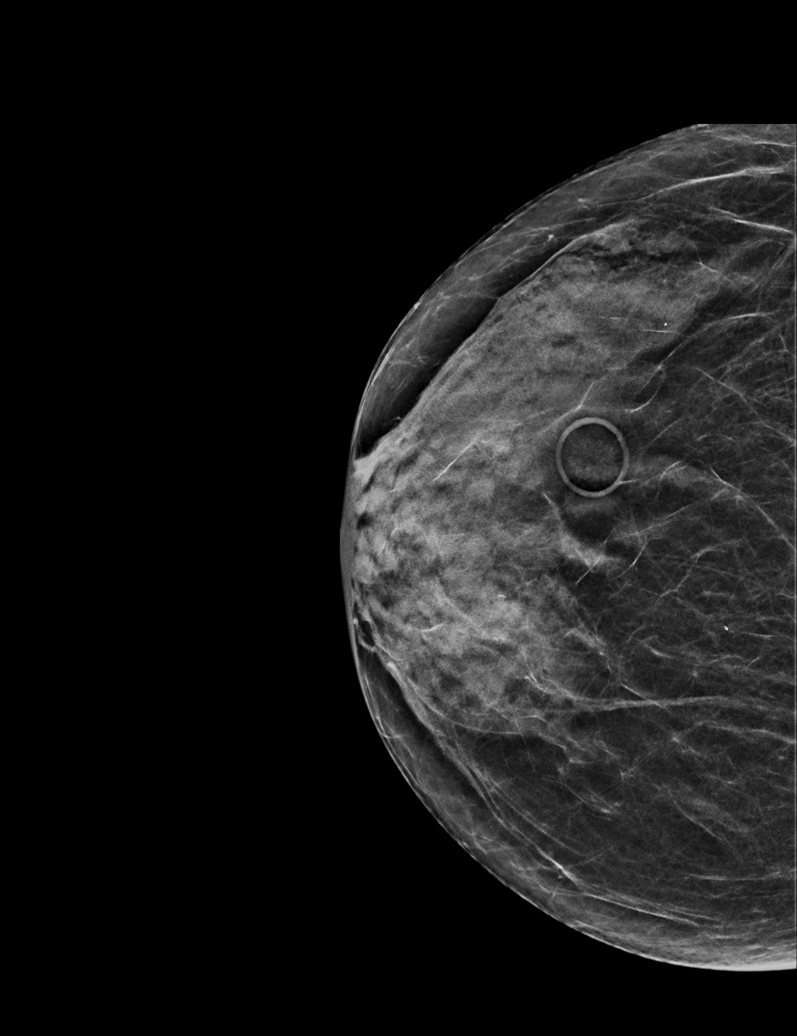

[R MLO tomo · tomo slice 22/43.0]
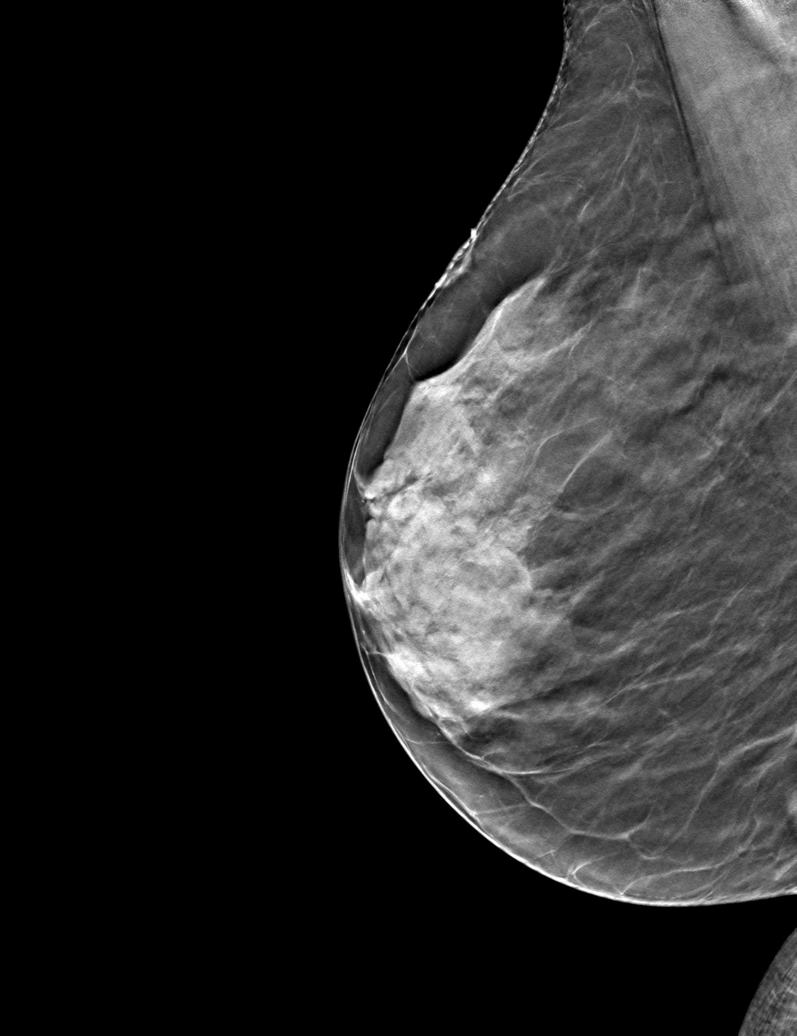

[6 of 30 positions shown; findings below may reference images not displayed]

ACR Breast Density Category d: The breast tissue is extremely dense,
which lowers the sensitivity of mammography
FINDINGS: There are no findings suspicious for malignancy. Images were
processed with CAD.
IMPRESSION: No mammographic evidence of malignancy. A result letter of this
screening mammogram will be mailed directly to the patient.

RECOMMENDATION:
Screening mammogram in one year. (Code:WO-0-ZI0)

BI-RADS CATEGORY  1: Negative.

## 2019-04-12 DIAGNOSIS — I10 Essential (primary) hypertension: Secondary | ICD-10-CM | POA: Diagnosis not present

## 2019-04-12 DIAGNOSIS — Z0001 Encounter for general adult medical examination with abnormal findings: Secondary | ICD-10-CM | POA: Diagnosis not present

## 2019-04-12 DIAGNOSIS — M1991 Primary osteoarthritis, unspecified site: Secondary | ICD-10-CM | POA: Diagnosis not present

## 2019-04-12 DIAGNOSIS — Z1389 Encounter for screening for other disorder: Secondary | ICD-10-CM | POA: Diagnosis not present

## 2019-04-13 DIAGNOSIS — Z0001 Encounter for general adult medical examination with abnormal findings: Secondary | ICD-10-CM | POA: Diagnosis not present

## 2019-04-13 DIAGNOSIS — Z1389 Encounter for screening for other disorder: Secondary | ICD-10-CM | POA: Diagnosis not present

## 2019-04-13 DIAGNOSIS — Z23 Encounter for immunization: Secondary | ICD-10-CM | POA: Diagnosis not present

## 2019-06-03 ENCOUNTER — Ambulatory Visit: Payer: Self-pay | Admitting: Obstetrics & Gynecology

## 2019-06-14 ENCOUNTER — Other Ambulatory Visit: Payer: Self-pay

## 2019-06-14 ENCOUNTER — Encounter: Payer: Self-pay | Admitting: Obstetrics & Gynecology

## 2019-06-14 ENCOUNTER — Ambulatory Visit (INDEPENDENT_AMBULATORY_CARE_PROVIDER_SITE_OTHER): Payer: Medicare Other | Admitting: Obstetrics & Gynecology

## 2019-06-14 VITALS — BP 135/68 | HR 79 | Ht 64.0 in | Wt 116.6 lb

## 2019-06-14 DIAGNOSIS — Z4689 Encounter for fitting and adjustment of other specified devices: Secondary | ICD-10-CM

## 2019-06-14 DIAGNOSIS — N813 Complete uterovaginal prolapse: Secondary | ICD-10-CM | POA: Diagnosis not present

## 2019-06-14 NOTE — Progress Notes (Signed)
Chief Complaint  Patient presents with  . Pessary Check    clean    Blood pressure 135/68, pulse 79, height 5\' 4"  (1.626 m), weight 116 lb 9.6 oz (52.9 kg).  [name] presents today for routine follow up related to her pessary.   She uses a Milex ring with support #2 She reports no vaginal discharge or vaginal bleeding.  Exam reveals no undue vaginal mucosal pressure of breakdown, no discharge and no vaginal bleeding.  The pessary is removed, cleaned and replaced without difficulty.    [name] will be sen back in 4 months for continued follow up.  Florian Buff, MD  06/14/2019 4:14 PM

## 2019-06-29 ENCOUNTER — Other Ambulatory Visit: Payer: Self-pay

## 2019-06-29 ENCOUNTER — Inpatient Hospital Stay (HOSPITAL_COMMUNITY): Payer: Medicare Other | Attending: Hematology

## 2019-06-29 DIAGNOSIS — Z801 Family history of malignant neoplasm of trachea, bronchus and lung: Secondary | ICD-10-CM | POA: Insufficient documentation

## 2019-06-29 DIAGNOSIS — Z880 Allergy status to penicillin: Secondary | ICD-10-CM | POA: Insufficient documentation

## 2019-06-29 DIAGNOSIS — D7589 Other specified diseases of blood and blood-forming organs: Secondary | ICD-10-CM | POA: Diagnosis not present

## 2019-06-29 DIAGNOSIS — Z79899 Other long term (current) drug therapy: Secondary | ICD-10-CM | POA: Diagnosis not present

## 2019-06-29 DIAGNOSIS — Z802 Family history of malignant neoplasm of other respiratory and intrathoracic organs: Secondary | ICD-10-CM | POA: Diagnosis not present

## 2019-06-29 DIAGNOSIS — Z8042 Family history of malignant neoplasm of prostate: Secondary | ICD-10-CM | POA: Diagnosis not present

## 2019-06-29 DIAGNOSIS — Z885 Allergy status to narcotic agent status: Secondary | ICD-10-CM | POA: Insufficient documentation

## 2019-06-29 DIAGNOSIS — D45 Polycythemia vera: Secondary | ICD-10-CM | POA: Diagnosis not present

## 2019-06-29 DIAGNOSIS — Z780 Asymptomatic menopausal state: Secondary | ICD-10-CM

## 2019-06-29 LAB — CBC WITH DIFFERENTIAL/PLATELET
Abs Immature Granulocytes: 0.02 10*3/uL (ref 0.00–0.07)
Basophils Absolute: 0.1 10*3/uL (ref 0.0–0.1)
Basophils Relative: 1 %
Eosinophils Absolute: 0.2 10*3/uL (ref 0.0–0.5)
Eosinophils Relative: 3 %
HCT: 38.8 % (ref 36.0–46.0)
Hemoglobin: 12.5 g/dL (ref 12.0–15.0)
Immature Granulocytes: 0 %
Lymphocytes Relative: 15 %
Lymphs Abs: 1 10*3/uL (ref 0.7–4.0)
MCH: 33.9 pg (ref 26.0–34.0)
MCHC: 32.2 g/dL (ref 30.0–36.0)
MCV: 105.1 fL — ABNORMAL HIGH (ref 80.0–100.0)
Monocytes Absolute: 0.5 10*3/uL (ref 0.1–1.0)
Monocytes Relative: 8 %
Neutro Abs: 4.6 10*3/uL (ref 1.7–7.7)
Neutrophils Relative %: 73 %
Platelets: 382 10*3/uL (ref 150–400)
RBC: 3.69 MIL/uL — ABNORMAL LOW (ref 3.87–5.11)
RDW: 14.3 % (ref 11.5–15.5)
WBC: 6.3 10*3/uL (ref 4.0–10.5)
nRBC: 0 % (ref 0.0–0.2)

## 2019-06-29 LAB — COMPREHENSIVE METABOLIC PANEL
ALT: 16 U/L (ref 0–44)
AST: 20 U/L (ref 15–41)
Albumin: 3.9 g/dL (ref 3.5–5.0)
Alkaline Phosphatase: 82 U/L (ref 38–126)
Anion gap: 8 (ref 5–15)
BUN: 16 mg/dL (ref 8–23)
CO2: 23 mmol/L (ref 22–32)
Calcium: 8.7 mg/dL — ABNORMAL LOW (ref 8.9–10.3)
Chloride: 106 mmol/L (ref 98–111)
Creatinine, Ser: 0.74 mg/dL (ref 0.44–1.00)
GFR calc Af Amer: >60 mL/min (ref >60)
GFR calc non Af Amer: >60 mL/min (ref >60)
Glucose, Bld: 136 mg/dL — ABNORMAL HIGH (ref 70–99)
Potassium: 4 mmol/L (ref 3.5–5.1)
Sodium: 137 mmol/L (ref 135–145)
Total Bilirubin: 0.4 mg/dL (ref 0.3–1.2)
Total Protein: 7.1 g/dL (ref 6.5–8.1)

## 2019-06-29 LAB — LACTATE DEHYDROGENASE: LDH: 156 U/L (ref 98–192)

## 2019-06-30 NOTE — Progress Notes (Signed)
For review.  Please update ordering provider

## 2019-07-06 ENCOUNTER — Other Ambulatory Visit: Payer: Self-pay

## 2019-07-06 ENCOUNTER — Inpatient Hospital Stay (HOSPITAL_BASED_OUTPATIENT_CLINIC_OR_DEPARTMENT_OTHER): Payer: Medicare Other | Admitting: Nurse Practitioner

## 2019-07-06 DIAGNOSIS — Z8042 Family history of malignant neoplasm of prostate: Secondary | ICD-10-CM

## 2019-07-06 DIAGNOSIS — Z880 Allergy status to penicillin: Secondary | ICD-10-CM | POA: Diagnosis not present

## 2019-07-06 DIAGNOSIS — D45 Polycythemia vera: Secondary | ICD-10-CM | POA: Diagnosis not present

## 2019-07-06 DIAGNOSIS — Z79899 Other long term (current) drug therapy: Secondary | ICD-10-CM | POA: Diagnosis not present

## 2019-07-06 DIAGNOSIS — Z801 Family history of malignant neoplasm of trachea, bronchus and lung: Secondary | ICD-10-CM | POA: Diagnosis not present

## 2019-07-06 DIAGNOSIS — Z885 Allergy status to narcotic agent status: Secondary | ICD-10-CM | POA: Diagnosis not present

## 2019-07-06 DIAGNOSIS — D7589 Other specified diseases of blood and blood-forming organs: Secondary | ICD-10-CM

## 2019-07-06 DIAGNOSIS — Z802 Family history of malignant neoplasm of other respiratory and intrathoracic organs: Secondary | ICD-10-CM

## 2019-07-06 NOTE — Progress Notes (Signed)
[name], [place] [address]   CLINIC:  Medical Oncology/Hematology  PCP:  [name], MD [place] [address] [phone]   REASON FOR VISIT: Follow-up for Jak 2+ polycythemia vera  CURRENT THERAPY: Hydrea 500 mg 4 times a week.  INTERVAL HISTORY:  [name] 83 y.o. female returns for routine follow-up for Jak 2+ polycythemia vera.  She reports she has been feeling well since her last visit.  She has no complaints at this time she is taking her Hydrea with no side effects.  Denies any nausea, vomiting, or diarrhea. Denies any new pains. Had not noticed any recent bleeding such as epistaxis, hematuria or hematochezia. Denies recent chest pain on exertion, shortness of breath on minimal exertion, pre-syncopal episodes, or palpitations. Denies any numbness or tingling in hands or feet. Denies any recent fevers, infections, or recent hospitalizations. Patient reports appetite at 75% and energy level at 75%.  She is eating well maintaining her weight at this time.    REVIEW OF SYSTEMS:  Review of Systems  All other systems reviewed and are negative.    PAST MEDICAL/SURGICAL HISTORY:  Past Medical History:  Diagnosis Date  . Hypertension   . Influenza 11/2012  . Osteoporosis   . Polycythemia   . Polycythemia vera(238.4) 02/25/2013   Diagnosed August 2005 on Hydrea 500 mg 3 times a week with excellent control.   . Prolapse, uterus, congenital    ring placement   Past Surgical History:  Procedure Laterality Date  . APPENDECTOMY    . COLONOSCOPY    . OOPHORECTOMY     left  . opperectomy       SOCIAL HISTORY:  Social History   Socioeconomic History  . Marital status: Divorced    Spouse name: Not on file  . Number of children: 1  . Years of education: Not on file  . Highest education level: Not on file  Occupational History  . Not on file  Social Needs  . Financial resource strain: Not on file  . Food  insecurity    Worry: Not on file    Inability: Not on file  . Transportation needs    Medical: Not on file    Non-medical: Not on file  Tobacco Use  . Smoking status: Never Smoker  . Smokeless tobacco: Never Used  Substance and Sexual Activity  . Alcohol use: No  . Drug use: No  . Sexual activity: Yes    Birth control/protection: None  Lifestyle  . Physical activity    Days per week: Not on file    Minutes per session: Not on file  . Stress: Not on file  Relationships  . Social Herbalist on phone: Not on file    Gets together: Not on file    Attends religious service: Not on file    Active member of club or organization: Not on file    Attends meetings of clubs or organizations: Not on file    Relationship status: Not on file  . Intimate partner violence    Fear of current or ex partner: Not on file    Emotionally abused: Not on file    Physically abused: Not on file    Forced sexual activity: Not on file  Other Topics Concern  . Not on file  Social History Narrative  . Not on file    FAMILY HISTORY:  Family History  Problem Relation Age of Onset  .  Cancer Brother        throat cancer  . Cancer Brother        lung cancer  . Cancer Brother        prostate cancer    CURRENT MEDICATIONS:  Outpatient Encounter Medications as of 07/06/2019  Medication Sig  . amLODipine (NORVASC) 5 MG tablet Take 5 mg by mouth daily.   Marland Kitchen aspirin 81 MG tablet Take 81 mg by mouth daily.    . Calcium Carbonate-Vitamin D (CALCIUM + D PO) Take 2,000 mg by mouth 2 (two) times daily.    Marland Kitchen diltiazem (CARDIZEM CD) 180 MG 24 hr capsule Take 180 mg by mouth daily.   . hydroxyurea (HYDREA) 500 MG capsule TAKE ONE CAPSULE BY MOUTH ON MONDAY, [place].   No facility-administered encounter medications on file as of 07/06/2019.     ALLERGIES:  Allergies  Allergen Reactions  . Codeine     Nausea and vomiting  . Diovan [Valsartan]     Rash and itching   .  Penicillins     Rash and "breaking out in whelps"   . Ramipril     coughing  . Ramipril     coughing     PHYSICAL EXAM:  ECOG Performance status: 1  Vitals:   07/06/19 1420  BP: 115/85  Pulse: 84  Resp: 18  Temp: 98.9 F (37.2 C)  SpO2: 99%   Filed Weights   07/06/19 1420  Weight: 118 lb 6.4 oz (53.7 kg)    Physical Exam Constitutional:      Appearance: Normal appearance. She is normal weight.  Cardiovascular:     Rate and Rhythm: Normal rate and regular rhythm.     Heart sounds: Normal heart sounds.  Pulmonary:     Effort: Pulmonary effort is normal.     Breath sounds: Normal breath sounds.  Abdominal:     General: Bowel sounds are normal.     Palpations: Abdomen is soft.  Musculoskeletal: Normal range of motion.  Skin:    General: Skin is warm and dry.  Neurological:     Mental Status: She is alert and oriented to person, place, and time. Mental status is at baseline.  Psychiatric:        Mood and Affect: Mood normal.        Behavior: Behavior normal.        Thought Content: Thought content normal.        Judgment: Judgment normal.      LABORATORY DATA:  I have reviewed the labs as listed.  CBC    Component Value Date/Time   WBC 6.3 06/29/2019 1251   RBC 3.69 (L) 06/29/2019 1251   HGB 12.5 06/29/2019 1251   HCT 38.8 06/29/2019 1251   PLT 382 06/29/2019 1251   MCV 105.1 (H) 06/29/2019 1251   MCH 33.9 06/29/2019 1251   MCHC 32.2 06/29/2019 1251   RDW 14.3 06/29/2019 1251   LYMPHSABS 1.0 06/29/2019 1251   MONOABS 0.5 06/29/2019 1251   EOSABS 0.2 06/29/2019 1251   BASOSABS 0.1 06/29/2019 1251   CMP Latest Ref Rng & Units 06/29/2019 12/23/2018 07/13/2018  Glucose 70 - 99 mg/dL 136(H) 134(H) 86  BUN 8 - 23 mg/dL 16 18 17   Creatinine 0.44 - 1.00 mg/dL 0.74 0.72 0.59  Sodium 135 - 145 mmol/L 137 136 138  Potassium 3.5 - 5.1 mmol/L 4.0 3.8 3.9  Chloride 98 - 111 mmol/L 106 102 104  CO2 22 - 32  mmol/L 23 25 26   Calcium 8.9 - 10.3 mg/dL 8.7(L) 8.8(L)  8.6(L)  Total Protein 6.5 - 8.1 g/dL 7.1 7.0 7.1  Total Bilirubin 0.3 - 1.2 mg/dL 0.4 1.0 0.7  Alkaline Phos 38 - 126 U/L 82 77 74  AST 15 - 41 U/L 20 21 25   ALT 0 - 44 U/L 16 16 15    I personally performed a face-to-face visit.  All questions were answered to patient's stated satisfaction. Encouraged patient to call with any new concerns or questions before his next visit to the cancer center and we can certain see him sooner, if needed.     ASSESSMENT & PLAN:   Polycythemia vera (HCC) 1.  Jak 2+ polycythemia vera: - She takes Hydrea 500 mg 4 times a week.  Monday Wednesday Friday and Saturday -Goal is to maintain platelet count of 450 or less. -Labs on 06/29/2019 showed hemoglobin 12.5, platelets 382, WBC 6.3, MCV 105.1, creatinine 0.70, LFTs WNL -She will follow-up in 6 months with repeat labs.  2.  Macrocytosis: -This is due to the Hydrea. - Labs on 06/29/2019 showed MCV 105.1.      Orders placed this encounter:  Orders Placed This Encounter  Procedures  . Lactate dehydrogenase  . CBC with Differential/Platelet  . Comprehensive metabolic panel  . Vitamin B12  . VITAMIN D 25 Hydroxy (Vit-D Deficiency, Fractures)      Francene Finders, FNP-C [place] [phone]

## 2019-07-06 NOTE — Patient Instructions (Signed)
[facility] at [facility] Discharge Instructions  Follow-up in 6 months with repeat labs.   Thank you for choosing [facility] at [facility] to provide your oncology and hematology care.  To afford each patient quality time with our provider, please arrive at least 15 minutes before your scheduled appointment time.   If you have a lab appointment with the Cancer Center please come in thru the  Main Entrance and check in at the main information desk  You need to re-schedule your appointment should you arrive 10 or more minutes late.  We strive to give you quality time with our providers, and arriving late affects you and other patients whose appointments are after yours.  Also, if you no show three or more times for appointments you may be dismissed from the clinic at the providers discretion.     Again, thank you for choosing [facility].  Our hope is that these requests will decrease the amount of time that you wait before being seen by our physicians.       _____________________________________________________________  Should you have questions after your visit to [facility], please contact our office at [phone] between the hours of 8:00 a.m. and 4:30 p.m.  Voicemails left after 4:00 p.m. will not be returned until the following business day.  For prescription refill requests, have your pharmacy contact our office and allow 72 hours.    Cancer Center Support Programs:   > Cancer Support Group  2nd Tuesday of the month 1pm-2pm, [facility]    

## 2019-07-06 NOTE — Assessment & Plan Note (Signed)
1.  Jak 2+ polycythemia vera: - She takes Hydrea 500 mg 4 times a week.  Monday Wednesday Friday and Saturday -Goal is to maintain platelet count of 450 or less. -Labs on 06/29/2019 showed hemoglobin 12.5, platelets 382, WBC 6.3, MCV 105.1, creatinine 0.70, LFTs WNL -She will follow-up in 6 months with repeat labs.  2.  Macrocytosis: -This is due to the Hydrea. - Labs on 06/29/2019 showed MCV 105.1.

## 2019-09-13 DIAGNOSIS — Z23 Encounter for immunization: Secondary | ICD-10-CM | POA: Diagnosis not present

## 2019-10-14 ENCOUNTER — Ambulatory Visit: Payer: Medicare Other | Admitting: Obstetrics & Gynecology

## 2019-10-15 ENCOUNTER — Telehealth: Payer: Self-pay | Admitting: Obstetrics & Gynecology

## 2019-10-15 NOTE — Telephone Encounter (Signed)

## 2019-10-18 ENCOUNTER — Encounter: Payer: Self-pay | Admitting: Obstetrics & Gynecology

## 2019-10-18 ENCOUNTER — Ambulatory Visit (INDEPENDENT_AMBULATORY_CARE_PROVIDER_SITE_OTHER): Payer: Medicare Other | Admitting: Obstetrics & Gynecology

## 2019-10-18 ENCOUNTER — Other Ambulatory Visit: Payer: Self-pay

## 2019-10-18 VITALS — BP 154/72 | HR 73 | Ht 64.0 in | Wt 116.5 lb

## 2019-10-18 DIAGNOSIS — N813 Complete uterovaginal prolapse: Secondary | ICD-10-CM | POA: Diagnosis not present

## 2019-10-18 DIAGNOSIS — Z4689 Encounter for fitting and adjustment of other specified devices: Secondary | ICD-10-CM | POA: Diagnosis not present

## 2019-10-18 NOTE — Progress Notes (Signed)
Chief Complaint  Patient presents with  . Pessary cleaning    Blood pressure (!) 154/72, pulse 73, height 5\' 4"  (1.626 m), weight 116 lb 8 oz (52.8 kg).  [name] presents today for routine follow up related to her pessary.   She uses a Milex ringwith support #2 She reports no vaginal discharge or vaginal bleeding.  Exam reveals no undue vaginal mucosal pressure of breakdown, no discharge and no vaginal bleeding.  The pessary is removed, cleaned and replaced without difficulty.    [name] will be sen back in 4 months for continued follow up.  Florian Buff, MD  10/18/2019 2:05 PM

## 2019-12-06 ENCOUNTER — Other Ambulatory Visit (HOSPITAL_COMMUNITY): Payer: Self-pay | Admitting: Internal Medicine

## 2019-12-06 DIAGNOSIS — D45 Polycythemia vera: Secondary | ICD-10-CM

## 2019-12-06 DIAGNOSIS — Z96642 Presence of left artificial hip joint: Secondary | ICD-10-CM | POA: Diagnosis not present

## 2019-12-06 NOTE — Telephone Encounter (Signed)
Refill Request.  

## 2019-12-16 DIAGNOSIS — I10 Essential (primary) hypertension: Secondary | ICD-10-CM | POA: Diagnosis not present

## 2019-12-16 DIAGNOSIS — M1991 Primary osteoarthritis, unspecified site: Secondary | ICD-10-CM | POA: Diagnosis not present

## 2019-12-16 DIAGNOSIS — M810 Age-related osteoporosis without current pathological fracture: Secondary | ICD-10-CM | POA: Diagnosis not present

## 2019-12-28 ENCOUNTER — Inpatient Hospital Stay (HOSPITAL_COMMUNITY): Payer: Medicare Other | Attending: Hematology

## 2019-12-28 ENCOUNTER — Other Ambulatory Visit: Payer: Self-pay

## 2019-12-28 DIAGNOSIS — D45 Polycythemia vera: Secondary | ICD-10-CM | POA: Insufficient documentation

## 2019-12-28 LAB — CBC WITH DIFFERENTIAL/PLATELET
Abs Immature Granulocytes: 0.02 10*3/uL (ref 0.00–0.07)
Basophils Absolute: 0.1 10*3/uL (ref 0.0–0.1)
Basophils Relative: 2 %
Eosinophils Absolute: 0.2 10*3/uL (ref 0.0–0.5)
Eosinophils Relative: 3 %
HCT: 42.2 % (ref 36.0–46.0)
Hemoglobin: 13.1 g/dL (ref 12.0–15.0)
Immature Granulocytes: 0 %
Lymphocytes Relative: 16 %
Lymphs Abs: 1.1 10*3/uL (ref 0.7–4.0)
MCH: 32.8 pg (ref 26.0–34.0)
MCHC: 31 g/dL (ref 30.0–36.0)
MCV: 105.8 fL — ABNORMAL HIGH (ref 80.0–100.0)
Monocytes Absolute: 0.5 10*3/uL (ref 0.1–1.0)
Monocytes Relative: 7 %
Neutro Abs: 4.6 10*3/uL (ref 1.7–7.7)
Neutrophils Relative %: 72 %
Platelets: 481 10*3/uL — ABNORMAL HIGH (ref 150–400)
RBC: 3.99 MIL/uL (ref 3.87–5.11)
RDW: 14.3 % (ref 11.5–15.5)
WBC: 6.4 10*3/uL (ref 4.0–10.5)
nRBC: 0 % (ref 0.0–0.2)

## 2019-12-28 LAB — COMPREHENSIVE METABOLIC PANEL
ALT: 12 U/L (ref 0–44)
AST: 16 U/L (ref 15–41)
Albumin: 3.9 g/dL (ref 3.5–5.0)
Alkaline Phosphatase: 103 U/L (ref 38–126)
Anion gap: 9 (ref 5–15)
BUN: 16 mg/dL (ref 8–23)
CO2: 25 mmol/L (ref 22–32)
Calcium: 8.6 mg/dL — ABNORMAL LOW (ref 8.9–10.3)
Chloride: 102 mmol/L (ref 98–111)
Creatinine, Ser: 0.58 mg/dL (ref 0.44–1.00)
GFR calc Af Amer: >60 mL/min (ref >60)
GFR calc non Af Amer: >60 mL/min (ref >60)
Glucose, Bld: 96 mg/dL (ref 70–99)
Potassium: 4.1 mmol/L (ref 3.5–5.1)
Sodium: 136 mmol/L (ref 135–145)
Total Bilirubin: 0.9 mg/dL (ref 0.3–1.2)
Total Protein: 7.3 g/dL (ref 6.5–8.1)

## 2019-12-28 LAB — LACTATE DEHYDROGENASE: LDH: 138 U/L (ref 98–192)

## 2019-12-28 LAB — VITAMIN B12: Vitamin B-12: 235 pg/mL (ref 180–914)

## 2019-12-29 LAB — VITAMIN D 25 HYDROXY (VIT D DEFICIENCY, FRACTURES): Vit D, 25-Hydroxy: 14.2 ng/mL — ABNORMAL LOW (ref 30–100)

## 2020-01-03 ENCOUNTER — Inpatient Hospital Stay (HOSPITAL_BASED_OUTPATIENT_CLINIC_OR_DEPARTMENT_OTHER): Payer: Medicare Other | Admitting: Nurse Practitioner

## 2020-01-03 ENCOUNTER — Other Ambulatory Visit: Payer: Self-pay

## 2020-01-03 DIAGNOSIS — Z79899 Other long term (current) drug therapy: Secondary | ICD-10-CM | POA: Diagnosis not present

## 2020-01-03 DIAGNOSIS — E559 Vitamin D deficiency, unspecified: Secondary | ICD-10-CM

## 2020-01-03 DIAGNOSIS — I10 Essential (primary) hypertension: Secondary | ICD-10-CM | POA: Diagnosis not present

## 2020-01-03 DIAGNOSIS — D7589 Other specified diseases of blood and blood-forming organs: Secondary | ICD-10-CM

## 2020-01-03 DIAGNOSIS — Z7982 Long term (current) use of aspirin: Secondary | ICD-10-CM

## 2020-01-03 DIAGNOSIS — D45 Polycythemia vera: Secondary | ICD-10-CM | POA: Diagnosis not present

## 2020-01-03 MED ORDER — ERGOCALCIFEROL 1.25 MG (50000 UT) PO CAPS: 50000.0000 [IU] | ORAL_CAPSULE | ORAL | 4 refills | Status: DC

## 2020-01-03 MED ORDER — HYDROXYUREA 500 MG PO CAPS: 500.0000 mg | ORAL_CAPSULE | ORAL | 0 refills | Status: DC

## 2020-01-03 NOTE — Progress Notes (Signed)
[place], [place] [address]   CLINIC:  Medical Oncology/Hematology  PCP:  [name], [place] [phone]   REASON FOR VISIT: Follow-up for polycythemia vera  CURRENT THERAPY: Hydrea 500 mg Monday through Friday   INTERVAL HISTORY:  [name] 84 y.o. female was called for a telephone visit today for her polycythemia vera.  Patient reports she has been doing well since her last visit.  She denies any headaches, vision changes or aquagenic pruritus.  She denies any leg ulcers. Denies any nausea, vomiting, or diarrhea. Denies any new pains. Had not noticed any recent bleeding such as epistaxis, hematuria or hematochezia. Denies recent chest pain on exertion, shortness of breath on minimal exertion, pre-syncopal episodes, or palpitations. Denies any numbness or tingling in hands or feet. Denies any recent fevers, infections, or recent hospitalizations. Patient reports appetite at 100% and energy level at 100%.  He is eating well maintain her weight at this time.    REVIEW OF SYSTEMS:  Review of Systems  All other systems reviewed and are negative.    PAST MEDICAL/SURGICAL HISTORY:  Past Medical History:  Diagnosis Date  . Hypertension   . Influenza 11/2012  . Osteoporosis   . Polycythemia   . Polycythemia vera(238.4) 02/25/2013   Diagnosed August 2005 on Hydrea 500 mg 3 times a week with excellent control.   . Prolapse, uterus, congenital    ring placement   Past Surgical History:  Procedure Laterality Date  . APPENDECTOMY    . COLONOSCOPY    . OOPHORECTOMY     left  . opperectomy       SOCIAL HISTORY:  Social History   Socioeconomic History  . Marital status: Divorced    Spouse name: Not on file  . Number of children: 1  . Years of education: Not on file  . Highest education level: Not on file  Occupational History  . Not on file  Tobacco Use  . Smoking status: Never Smoker  .  Smokeless tobacco: Never Used  Substance and Sexual Activity  . Alcohol use: No  . Drug use: No  . Sexual activity: Yes    Birth control/protection: Post-menopausal  Other Topics Concern  . Not on file  Social History Narrative  . Not on file   Social Determinants of Health   Financial Resource Strain:   . Difficulty of Paying Living Expenses: Not on file  Food Insecurity:   . Worried About Charity fundraiser in the Last Year: Not on file  . Ran Out of Food in the Last Year: Not on file  Transportation Needs:   . Lack of Transportation (Medical): Not on file  . Lack of Transportation (Non-Medical): Not on file  Physical Activity:   . Days of Exercise per Week: Not on file  . Minutes of Exercise per Session: Not on file  Stress:   . Feeling of Stress : Not on file  Social Connections:   . Frequency of Communication with Friends and Family: Not on file  . Frequency of Social Gatherings with Friends and Family: Not on file  . Attends Religious Services: Not on file  . Active Member of Clubs or Organizations: Not on file  . Attends Archivist Meetings: Not on file  . Marital Status: Not on file  Intimate Partner Violence:   . Fear of Current or Ex-Partner: Not on file  . Emotionally Abused: Not on file  .  Physically Abused: Not on file  . Sexually Abused: Not on file    FAMILY HISTORY:  Family History  Problem Relation Age of Onset  . Cancer Brother        throat cancer  . Cancer Brother        lung cancer  . Cancer Brother        prostate cancer    CURRENT MEDICATIONS:  Outpatient Encounter Medications as of 01/03/2020  Medication Sig  . amLODipine (NORVASC) 5 MG tablet Take 5 mg by mouth daily.   Marland Kitchen aspirin 81 MG tablet Take 81 mg by mouth daily.    . Calcium Carbonate-Vitamin D (CALCIUM + D PO) Take 2,000 mg by mouth 2 (two) times daily.    Marland Kitchen diltiazem (CARDIZEM CD) 180 MG 24 hr capsule Take 180 mg by mouth daily.   . hydroxyurea (HYDREA) 500 MG  capsule Take 1 capsule (500 mg total) by mouth as directed. Take One capsule by mouth on Monday, Tuesday, Wednesday, Thursday, and Friday ONLY. May take with food to minimize GI side effects.  . [DISCONTINUED] hydroxyurea (HYDREA) 500 MG capsule TAKE ONE CAPSULE BY MOUTH ON MONDAY, [place].   No facility-administered encounter medications on file as of 01/03/2020.    ALLERGIES:  Allergies  Allergen Reactions  . Codeine     Nausea and vomiting  . Diovan [Valsartan]     Rash and itching   . Penicillins     Rash and "breaking out in whelps"   . Ramipril     coughing  . Ramipril     coughing   Vital signs: -Deferred due to telephone visit  Physical Exam -Deferred due to telephone visit -Patient was alert and oriented over the phone and in no acute distress.  LABORATORY DATA:  I have reviewed the labs as listed.  CBC    Component Value Date/Time   WBC 6.4 12/28/2019 1239   RBC 3.99 12/28/2019 1239   HGB 13.1 12/28/2019 1239   HCT 42.2 12/28/2019 1239   PLT 481 (H) 12/28/2019 1239   MCV 105.8 (H) 12/28/2019 1239   MCH 32.8 12/28/2019 1239   MCHC 31.0 12/28/2019 1239   RDW 14.3 12/28/2019 1239   LYMPHSABS 1.1 12/28/2019 1239   MONOABS 0.5 12/28/2019 1239   EOSABS 0.2 12/28/2019 1239   BASOSABS 0.1 12/28/2019 1239   CMP Latest Ref Rng & Units 12/28/2019 06/29/2019 12/23/2018  Glucose 70 - 99 mg/dL 96 136(H) 134(H)  BUN 8 - 23 mg/dL 16 16 18   Creatinine 0.44 - 1.00 mg/dL 0.58 0.74 0.72  Sodium 135 - 145 mmol/L 136 137 136  Potassium 3.5 - 5.1 mmol/L 4.1 4.0 3.8  Chloride 98 - 111 mmol/L 102 106 102  CO2 22 - 32 mmol/L 25 23 25   Calcium 8.9 - 10.3 mg/dL 8.6(L) 8.7(L) 8.8(L)  Total Protein 6.5 - 8.1 g/dL 7.3 7.1 7.0  Total Bilirubin 0.3 - 1.2 mg/dL 0.9 0.4 1.0  Alkaline Phos 38 - 126 U/L 103 82 77  AST 15 - 41 U/L 16 20 21   ALT 0 - 44 U/L 12 16 16     All questions were answered to patient's stated satisfaction. Encouraged patient to call with  any new concerns or questions before his next visit to the cancer center and we can certain see him sooner, if needed.     ASSESSMENT & PLAN:   Polycythemia vera (HCC) 1.  Jak 2+ polycythemia vera: - She takes Hydrea 500 mg  4 times a week.  Monday Wednesday Friday and Saturday -Goal is to maintain platelet count of 450 or less. -Labs on 12/28/2019 showed hemoglobin 13.1, platelets have increased to forty-one, WBC 6.4, MCV 105.8, creatinine 0.58, LFTs WNL -We will increase her Hydrea 500 mg to five times a week on Monday, Tuesday, Wednesday, Thursday, Friday.  She will take the weekends off. -She denies any aquagenic pruritus, headaches, night sweats, or leg ulcers. -She will follow-up in 2 months with repeat labs.  2.  Macrocytosis: -This is due to the Hydrea. - Labs on 12/28/2019 showed MCV 105.8.      Orders placed this encounter:  Orders Placed This Encounter  Procedures  . Lactate dehydrogenase  . CBC with Differential/Platelet  . Comprehensive metabolic panel  . Ferritin  . Iron and TIBC  . Vitamin B12  . VITAMIN D 25 Hydroxy (Vit-D Deficiency, Fractures)  . Folate    I provided 20 minutes of non face-to-face telephone visit time during this encounter, and > 50% was spent counseling as documented under my assessment & plan.  Francene Finders, FNP-C [place] [phone]

## 2020-01-03 NOTE — Assessment & Plan Note (Addendum)
1.  Jak 2+ polycythemia vera: - She takes Hydrea 500 mg 4 times a week.  Monday Wednesday Friday and Saturday -Goal is to maintain platelet count of 450 or less. -Labs on 12/28/2019 showed hemoglobin 13.1, platelets have increased to forty-one, WBC 6.4, MCV 105.8, creatinine 0.58, LFTs WNL -We will increase her Hydrea 500 mg to five times a week on Monday, Tuesday, Wednesday, Thursday, Friday.  She will take the weekends off. -She denies any aquagenic pruritus, headaches, night sweats, or leg ulcers. -She will follow-up in 2 months with repeat labs.  2.  Macrocytosis: -This is due to the Hydrea. - Labs on 12/28/2019 showed MCV 105.8.

## 2020-01-04 ENCOUNTER — Ambulatory Visit (HOSPITAL_COMMUNITY): Payer: Medicare Other | Admitting: Nurse Practitioner

## 2020-02-14 ENCOUNTER — Telehealth: Payer: Self-pay | Admitting: Obstetrics & Gynecology

## 2020-02-14 NOTE — Telephone Encounter (Signed)

## 2020-02-15 ENCOUNTER — Encounter: Payer: Self-pay | Admitting: Obstetrics & Gynecology

## 2020-02-15 ENCOUNTER — Other Ambulatory Visit: Payer: Self-pay

## 2020-02-15 ENCOUNTER — Ambulatory Visit (INDEPENDENT_AMBULATORY_CARE_PROVIDER_SITE_OTHER): Payer: Medicare Other | Admitting: Obstetrics & Gynecology

## 2020-02-15 VITALS — BP 163/72 | HR 68 | Ht 64.0 in | Wt 119.0 lb

## 2020-02-15 DIAGNOSIS — N813 Complete uterovaginal prolapse: Secondary | ICD-10-CM | POA: Diagnosis not present

## 2020-02-15 DIAGNOSIS — Z4689 Encounter for fitting and adjustment of other specified devices: Secondary | ICD-10-CM | POA: Diagnosis not present

## 2020-02-15 NOTE — Progress Notes (Signed)
Chief Complaint  Patient presents with  . Pessary Check    Blood pressure (!) 163/72, pulse 68, height 5\' 4"  (1.626 m), weight 119 lb (54 kg).  [name] presents today for routine follow up related to her pessary.   She uses a Milex ring with support #2 She reports no vaginal discharge or vaginal bleeding.  Exam reveals no undue vaginal mucosal pressure of breakdown, no discharge and no vaginal bleeding.  The pessary is removed, cleaned and replaced without difficulty.    [name] will be sen back in 4 months for continued follow up.  Florian Buff, MD  02/15/2020 3:38 PM

## 2020-03-01 ENCOUNTER — Inpatient Hospital Stay (HOSPITAL_COMMUNITY): Payer: Medicare Other | Attending: Hematology

## 2020-03-01 ENCOUNTER — Other Ambulatory Visit: Payer: Self-pay

## 2020-03-01 DIAGNOSIS — D7589 Other specified diseases of blood and blood-forming organs: Secondary | ICD-10-CM | POA: Diagnosis not present

## 2020-03-01 DIAGNOSIS — E559 Vitamin D deficiency, unspecified: Secondary | ICD-10-CM | POA: Diagnosis not present

## 2020-03-01 DIAGNOSIS — D45 Polycythemia vera: Secondary | ICD-10-CM

## 2020-03-01 LAB — COMPREHENSIVE METABOLIC PANEL
ALT: 14 U/L (ref 0–44)
AST: 18 U/L (ref 15–41)
Albumin: 3.8 g/dL (ref 3.5–5.0)
Alkaline Phosphatase: 94 U/L (ref 38–126)
Anion gap: 7 (ref 5–15)
BUN: 15 mg/dL (ref 8–23)
CO2: 28 mmol/L (ref 22–32)
Calcium: 8.4 mg/dL — ABNORMAL LOW (ref 8.9–10.3)
Chloride: 103 mmol/L (ref 98–111)
Creatinine, Ser: 0.59 mg/dL (ref 0.44–1.00)
GFR calc Af Amer: >60 mL/min (ref >60)
GFR calc non Af Amer: >60 mL/min (ref >60)
Glucose, Bld: 134 mg/dL — ABNORMAL HIGH (ref 70–99)
Potassium: 3.9 mmol/L (ref 3.5–5.1)
Sodium: 138 mmol/L (ref 135–145)
Total Bilirubin: 0.7 mg/dL (ref 0.3–1.2)
Total Protein: 6.9 g/dL (ref 6.5–8.1)

## 2020-03-01 LAB — CBC WITH DIFFERENTIAL/PLATELET
Abs Immature Granulocytes: 0.02 10*3/uL (ref 0.00–0.07)
Basophils Absolute: 0.1 10*3/uL (ref 0.0–0.1)
Basophils Relative: 1 %
Eosinophils Absolute: 0.2 10*3/uL (ref 0.0–0.5)
Eosinophils Relative: 3 %
HCT: 39 % (ref 36.0–46.0)
Hemoglobin: 12.4 g/dL (ref 12.0–15.0)
Immature Granulocytes: 0 %
Lymphocytes Relative: 14 %
Lymphs Abs: 0.8 10*3/uL (ref 0.7–4.0)
MCH: 34.2 pg — ABNORMAL HIGH (ref 26.0–34.0)
MCHC: 31.8 g/dL (ref 30.0–36.0)
MCV: 107.4 fL — ABNORMAL HIGH (ref 80.0–100.0)
Monocytes Absolute: 0.5 10*3/uL (ref 0.1–1.0)
Monocytes Relative: 8 %
Neutro Abs: 4.5 10*3/uL (ref 1.7–7.7)
Neutrophils Relative %: 74 %
Platelets: 365 10*3/uL (ref 150–400)
RBC: 3.63 MIL/uL — ABNORMAL LOW (ref 3.87–5.11)
RDW: 15.2 % (ref 11.5–15.5)
WBC: 6.1 10*3/uL (ref 4.0–10.5)
nRBC: 0 % (ref 0.0–0.2)

## 2020-03-01 LAB — VITAMIN B12: Vitamin B-12: 270 pg/mL (ref 180–914)

## 2020-03-01 LAB — VITAMIN D 25 HYDROXY (VIT D DEFICIENCY, FRACTURES): Vit D, 25-Hydroxy: 64.85 ng/mL (ref 30–100)

## 2020-03-01 LAB — FERRITIN: Ferritin: 50 ng/mL (ref 11–307)

## 2020-03-01 LAB — IRON AND TIBC
Iron: 110 ug/dL (ref 28–170)
Saturation Ratios: 35 % — ABNORMAL HIGH (ref 10.4–31.8)
TIBC: 314 ug/dL (ref 250–450)
UIBC: 204 ug/dL

## 2020-03-01 LAB — LACTATE DEHYDROGENASE: LDH: 149 U/L (ref 98–192)

## 2020-03-01 LAB — FOLATE: Folate: 12.1 ng/mL (ref >5.9)

## 2020-03-08 ENCOUNTER — Inpatient Hospital Stay (HOSPITAL_BASED_OUTPATIENT_CLINIC_OR_DEPARTMENT_OTHER): Payer: Medicare Other | Admitting: Nurse Practitioner

## 2020-03-08 ENCOUNTER — Other Ambulatory Visit: Payer: Self-pay

## 2020-03-08 DIAGNOSIS — D45 Polycythemia vera: Secondary | ICD-10-CM

## 2020-03-08 NOTE — Assessment & Plan Note (Signed)
1.  Jak 2+ polycythemia vera: - She takes Hydrea 500 mg 4 times a week.  Monday Wednesday Friday and Saturday -Goal is to maintain platelet count of 450 or less. -She is taking Hydrea 500 mg five times a week on Monday, Tuesday, Wednesday, Thursday, Friday.  She will take the weekends off. -She denies any aquagenic pruritus, headaches, night sweats, or leg ulcers. Labs on 03/01/2020 showed hemoglobin 12.4, platelets 365, WBC 6.1, MCV 107.4, creatinine 0.59. -She will follow-up in 2 months with repeat labs.  2.  Macrocytosis: -This is due to the Hydrea. - Labs on 03/01/2020 showed MCV 107.4.

## 2020-03-08 NOTE — Patient Instructions (Signed)
[facility] at [facility] Discharge Instructions  Follow up in 2 months with labs    Thank you for choosing [facility] at [facility] to provide your oncology and hematology care.  To afford each patient quality time with our provider, please arrive at least 15 minutes before your scheduled appointment time.   If you have a lab appointment with the Cancer Center please come in thru the Main Entrance and check in at the main information desk.  You need to re-schedule your appointment should you arrive 10 or more minutes late.  We strive to give you quality time with our providers, and arriving late affects you and other patients whose appointments are after yours.  Also, if you no show three or more times for appointments you may be dismissed from the clinic at the providers discretion.     Again, thank you for choosing [facility].  Our hope is that these requests will decrease the amount of time that you wait before being seen by our physicians.       _____________________________________________________________  Should you have questions after your visit to [facility], please contact our office at [phone] between the hours of 8:00 a.m. and 4:30 p.m.  Voicemails left after 4:00 p.m. will not be returned until the following business day.  For prescription refill requests, have your pharmacy contact our office and allow 72 hours.    Due to Covid, you will need to wear a mask upon entering the hospital. If you do not have a mask, a mask will be given to you at the Main Entrance upon arrival. For doctor visits, patients may have 1 support person with them. For treatment visits, patients can not have anyone with them due to social distancing guidelines and our immunocompromised population.      

## 2020-03-08 NOTE — Progress Notes (Signed)
[place], [place] [address]   CLINIC:  Medical Oncology/Hematology  PCP:  [name], [place] [id number] [phone]   REASON FOR VISIT: Follow-up for polycythemia  CURRENT THERAPY: Hydrea   INTERVAL HISTORY:  [name] 84 y.o. female was called for a telephone visit for follow-up for polycythemia.  Patient reports she is taking her medication as prescribed.  She has no side effects.  She denies any leg ulcers.  She denies any extreme fatigue. Denies any nausea, vomiting, or diarrhea. Denies any new pains. Had not noticed any recent bleeding such as epistaxis, hematuria or hematochezia. Denies recent chest pain on exertion, shortness of breath on minimal exertion, pre-syncopal episodes, or palpitations. Denies any numbness or tingling in hands or feet. Denies any recent fevers, infections, or recent hospitalizations. Patient reports appetite at 100% and energy level at 75%.  She is eating well maintaining her weight at this time.     REVIEW OF SYSTEMS:  Review of Systems  All other systems reviewed and are negative.    PAST MEDICAL/SURGICAL HISTORY:  Past Medical History:  Diagnosis Date  . Hypertension   . Influenza 11/2012  . Osteoporosis   . Polycythemia   . Polycythemia vera(238.4) 02/25/2013   Diagnosed August 2005 on Hydrea 500 mg 3 times a week with excellent control.   . Prolapse, uterus, congenital    ring placement   Past Surgical History:  Procedure Laterality Date  . APPENDECTOMY    . COLONOSCOPY    . OOPHORECTOMY     left  . opperectomy       SOCIAL HISTORY:  Social History   Socioeconomic History  . Marital status: Divorced    Spouse name: Not on file  . Number of children: 1  . Years of education: Not on file  . Highest education level: Not on file  Occupational History  . Not on file  Tobacco Use  . Smoking status: Never Smoker  . Smokeless tobacco: Never Used   Substance and Sexual Activity  . Alcohol use: No  . Drug use: No  . Sexual activity: Yes    Birth control/protection: Post-menopausal  Other Topics Concern  . Not on file  Social History Narrative  . Not on file   Social Determinants of Health   Financial Resource Strain:   . Difficulty of Paying Living Expenses:   Food Insecurity:   . Worried About Charity fundraiser in the Last Year:   . Arboriculturist in the Last Year:   Transportation Needs:   . Film/video editor (Medical):   Marland Kitchen Lack of Transportation (Non-Medical):   Physical Activity:   . Days of Exercise per Week:   . Minutes of Exercise per Session:   Stress:   . Feeling of Stress :   Social Connections:   . Frequency of Communication with Friends and Family:   . Frequency of Social Gatherings with Friends and Family:   . Attends Religious Services:   . Active Member of Clubs or Organizations:   . Attends Archivist Meetings:   Marland Kitchen Marital Status:   Intimate Partner Violence:   . Fear of Current or Ex-Partner:   . Emotionally Abused:   Marland Kitchen Physically Abused:   . Sexually Abused:     FAMILY HISTORY:  Family History  Problem Relation Age of Onset  . Cancer Brother        throat cancer  .  Cancer Brother        lung cancer  . Cancer Brother        prostate cancer    CURRENT MEDICATIONS:  Outpatient Encounter Medications as of 03/08/2020  Medication Sig  . amLODipine (NORVASC) 5 MG tablet Take 5 mg by mouth daily.   Marland Kitchen aspirin 81 MG tablet Take 81 mg by mouth daily.    . Calcium Carbonate-Vitamin D (CALCIUM + D PO) Take 2,000 mg by mouth 2 (two) times daily.    Marland Kitchen diltiazem (CARDIZEM CD) 180 MG 24 hr capsule Take 180 mg by mouth daily.   . ergocalciferol (VITAMIN D2) 1.25 MG (50000 UT) capsule Take 1 capsule (50,000 Units total) by mouth once a week.  . hydroxyurea (HYDREA) 500 MG capsule Take 1 capsule (500 mg total) by mouth as directed. Take One capsule by mouth on Monday, Tuesday, Wednesday,  Thursday, and Friday ONLY. May take with food to minimize GI side effects.   No facility-administered encounter medications on file as of 03/08/2020.    ALLERGIES:  Allergies  Allergen Reactions  . Codeine     Nausea and vomiting  . Diovan [Valsartan]     Rash and itching   . Penicillins     Rash and "breaking out in whelps"   . Ramipril     coughing  . Ramipril     coughing    Vital signs: -Deferred due to telephone visit  Physical Exam -Deferred due to telephone visit -Patient was alert and oriented over the phone and in no acute distress.  LABORATORY DATA:  I have reviewed the labs as listed.  CBC    Component Value Date/Time   WBC 6.1 03/01/2020 1336   RBC 3.63 (L) 03/01/2020 1336   HGB 12.4 03/01/2020 1336   HCT 39.0 03/01/2020 1336   PLT 365 03/01/2020 1336   MCV 107.4 (H) 03/01/2020 1336   MCH 34.2 (H) 03/01/2020 1336   MCHC 31.8 03/01/2020 1336   RDW 15.2 03/01/2020 1336   LYMPHSABS 0.8 03/01/2020 1336   MONOABS 0.5 03/01/2020 1336   EOSABS 0.2 03/01/2020 1336   BASOSABS 0.1 03/01/2020 1336   CMP Latest Ref Rng & Units 03/01/2020 12/28/2019 06/29/2019  Glucose 70 - 99 mg/dL 134(H) 96 136(H)  BUN 8 - 23 mg/dL 15 16 16   Creatinine 0.44 - 1.00 mg/dL 0.59 0.58 0.74  Sodium 135 - 145 mmol/L 138 136 137  Potassium 3.5 - 5.1 mmol/L 3.9 4.1 4.0  Chloride 98 - 111 mmol/L 103 102 106  CO2 22 - 32 mmol/L 28 25 23   Calcium 8.9 - 10.3 mg/dL 8.4(L) 8.6(L) 8.7(L)  Total Protein 6.5 - 8.1 g/dL 6.9 7.3 7.1  Total Bilirubin 0.3 - 1.2 mg/dL 0.7 0.9 0.4  Alkaline Phos 38 - 126 U/L 94 103 82  AST 15 - 41 U/L 18 16 20   ALT 0 - 44 U/L 14 12 16        DIAGNOSTIC IMAGING:  I have independently reviewed the scans and discussed with the patient.   I have reviewed Francene Finders, NP's note and agree with the documentation.  I personally performed a face-to-face visit, made revisions and my assessment and plan is as follows.    ASSESSMENT & PLAN:   Polycythemia vera  (HCC) 1.  Jak 2+ polycythemia vera: - She takes Hydrea 500 mg 4 times a week.  Monday Wednesday Friday and Saturday -Goal is to maintain platelet count of 450 or less. -She is taking Hydrea 500 mg  five times a week on Monday, Tuesday, Wednesday, Thursday, Friday.  She will take the weekends off. -She denies any aquagenic pruritus, headaches, night sweats, or leg ulcers. Labs on 03/01/2020 showed hemoglobin 12.4, platelets 365, WBC 6.1, MCV 107.4, creatinine 0.59. -She will follow-up in 2 months with repeat labs.  2.  Macrocytosis: -This is due to the Hydrea. - Labs on 03/01/2020 showed MCV 107.4.      Orders placed this encounter:  Orders Placed This Encounter  Procedures  . Lactate dehydrogenase  . CBC with Differential/Platelet  . Comprehensive metabolic panel  . Ferritin  . Iron and TIBC  . CBC with Differential/Platelet  . Comprehensive metabolic panel  . Vitamin B12  . VITAMIN D 25 Hydroxy (Vit-D Deficiency, Fractures)  . Folate    I provided 32 minutes of non face-to-face telephone visit time during this encounter, and > 50% was spent counseling as documented under my assessment & plan.  Francene Finders, [place] [phone]

## 2020-04-14 DIAGNOSIS — M810 Age-related osteoporosis without current pathological fracture: Secondary | ICD-10-CM | POA: Diagnosis not present

## 2020-04-14 DIAGNOSIS — I10 Essential (primary) hypertension: Secondary | ICD-10-CM | POA: Diagnosis not present

## 2020-04-14 DIAGNOSIS — M1991 Primary osteoarthritis, unspecified site: Secondary | ICD-10-CM | POA: Diagnosis not present

## 2020-04-18 DIAGNOSIS — Z13220 Encounter for screening for lipoid disorders: Secondary | ICD-10-CM | POA: Diagnosis not present

## 2020-04-18 DIAGNOSIS — M810 Age-related osteoporosis without current pathological fracture: Secondary | ICD-10-CM | POA: Diagnosis not present

## 2020-04-18 DIAGNOSIS — I10 Essential (primary) hypertension: Secondary | ICD-10-CM | POA: Diagnosis not present

## 2020-04-18 DIAGNOSIS — J302 Other seasonal allergic rhinitis: Secondary | ICD-10-CM | POA: Diagnosis not present

## 2020-04-18 DIAGNOSIS — Z1389 Encounter for screening for other disorder: Secondary | ICD-10-CM | POA: Diagnosis not present

## 2020-04-18 DIAGNOSIS — Z0000 Encounter for general adult medical examination without abnormal findings: Secondary | ICD-10-CM | POA: Diagnosis not present

## 2020-04-18 DIAGNOSIS — D45 Polycythemia vera: Secondary | ICD-10-CM | POA: Diagnosis not present

## 2020-04-18 DIAGNOSIS — M1991 Primary osteoarthritis, unspecified site: Secondary | ICD-10-CM | POA: Diagnosis not present

## 2020-04-18 DIAGNOSIS — J309 Allergic rhinitis, unspecified: Secondary | ICD-10-CM | POA: Diagnosis not present

## 2020-04-20 DIAGNOSIS — E559 Vitamin D deficiency, unspecified: Secondary | ICD-10-CM | POA: Diagnosis not present

## 2020-04-20 DIAGNOSIS — E039 Hypothyroidism, unspecified: Secondary | ICD-10-CM | POA: Diagnosis not present

## 2020-04-20 DIAGNOSIS — Z681 Body mass index (BMI) 19 or less, adult: Secondary | ICD-10-CM | POA: Diagnosis not present

## 2020-04-20 DIAGNOSIS — Z0000 Encounter for general adult medical examination without abnormal findings: Secondary | ICD-10-CM | POA: Diagnosis not present

## 2020-04-20 DIAGNOSIS — E782 Mixed hyperlipidemia: Secondary | ICD-10-CM | POA: Diagnosis not present

## 2020-04-20 DIAGNOSIS — R7989 Other specified abnormal findings of blood chemistry: Secondary | ICD-10-CM | POA: Diagnosis not present

## 2020-04-20 DIAGNOSIS — Z1389 Encounter for screening for other disorder: Secondary | ICD-10-CM | POA: Diagnosis not present

## 2020-05-08 ENCOUNTER — Inpatient Hospital Stay (HOSPITAL_COMMUNITY): Payer: Medicare Other | Attending: Hematology

## 2020-05-08 ENCOUNTER — Other Ambulatory Visit: Payer: Self-pay

## 2020-05-08 DIAGNOSIS — D45 Polycythemia vera: Secondary | ICD-10-CM | POA: Diagnosis not present

## 2020-05-08 DIAGNOSIS — E559 Vitamin D deficiency, unspecified: Secondary | ICD-10-CM | POA: Diagnosis not present

## 2020-05-08 DIAGNOSIS — D7589 Other specified diseases of blood and blood-forming organs: Secondary | ICD-10-CM | POA: Diagnosis not present

## 2020-05-08 LAB — COMPREHENSIVE METABOLIC PANEL
ALT: 13 U/L (ref 0–44)
AST: 19 U/L (ref 15–41)
Albumin: 4 g/dL (ref 3.5–5.0)
Alkaline Phosphatase: 78 U/L (ref 38–126)
Anion gap: 8 (ref 5–15)
BUN: 23 mg/dL (ref 8–23)
CO2: 26 mmol/L (ref 22–32)
Calcium: 8.6 mg/dL — ABNORMAL LOW (ref 8.9–10.3)
Chloride: 102 mmol/L (ref 98–111)
Creatinine, Ser: 0.58 mg/dL (ref 0.44–1.00)
GFR calc Af Amer: >60 mL/min (ref >60)
GFR calc non Af Amer: >60 mL/min (ref >60)
Glucose, Bld: 86 mg/dL (ref 70–99)
Potassium: 3.8 mmol/L (ref 3.5–5.1)
Sodium: 136 mmol/L (ref 135–145)
Total Bilirubin: 0.7 mg/dL (ref 0.3–1.2)
Total Protein: 7.1 g/dL (ref 6.5–8.1)

## 2020-05-08 LAB — FOLATE: Folate: 14.1 ng/mL (ref >5.9)

## 2020-05-08 LAB — IRON AND TIBC
Iron: 93 ug/dL (ref 28–170)
Saturation Ratios: 28 % (ref 10.4–31.8)
TIBC: 337 ug/dL (ref 250–450)
UIBC: 244 ug/dL

## 2020-05-08 LAB — CBC WITH DIFFERENTIAL/PLATELET
Abs Immature Granulocytes: 0.01 10*3/uL (ref 0.00–0.07)
Basophils Absolute: 0.1 10*3/uL (ref 0.0–0.1)
Basophils Relative: 2 %
Eosinophils Absolute: 0.1 10*3/uL (ref 0.0–0.5)
Eosinophils Relative: 2 %
HCT: 39.5 % (ref 36.0–46.0)
Hemoglobin: 12.4 g/dL (ref 12.0–15.0)
Immature Granulocytes: 0 %
Lymphocytes Relative: 19 %
Lymphs Abs: 1.1 10*3/uL (ref 0.7–4.0)
MCH: 33.5 pg (ref 26.0–34.0)
MCHC: 31.4 g/dL (ref 30.0–36.0)
MCV: 106.8 fL — ABNORMAL HIGH (ref 80.0–100.0)
Monocytes Absolute: 0.5 10*3/uL (ref 0.1–1.0)
Monocytes Relative: 8 %
Neutro Abs: 4.3 10*3/uL (ref 1.7–7.7)
Neutrophils Relative %: 69 %
Platelets: 320 10*3/uL (ref 150–400)
RBC: 3.7 MIL/uL — ABNORMAL LOW (ref 3.87–5.11)
RDW: 14.5 % (ref 11.5–15.5)
WBC: 6.2 10*3/uL (ref 4.0–10.5)
nRBC: 0 % (ref 0.0–0.2)

## 2020-05-08 LAB — LACTATE DEHYDROGENASE: LDH: 142 U/L (ref 98–192)

## 2020-05-08 LAB — VITAMIN B12: Vitamin B-12: 725 pg/mL (ref 180–914)

## 2020-05-08 LAB — FERRITIN: Ferritin: 30 ng/mL (ref 11–307)

## 2020-05-08 LAB — VITAMIN D 25 HYDROXY (VIT D DEFICIENCY, FRACTURES): Vit D, 25-Hydroxy: 98.75 ng/mL (ref 30–100)

## 2020-05-12 ENCOUNTER — Inpatient Hospital Stay (HOSPITAL_BASED_OUTPATIENT_CLINIC_OR_DEPARTMENT_OTHER): Payer: Medicare Other | Admitting: Nurse Practitioner

## 2020-05-12 ENCOUNTER — Other Ambulatory Visit: Payer: Self-pay

## 2020-05-12 ENCOUNTER — Telehealth (HOSPITAL_COMMUNITY): Payer: Medicare Other | Admitting: Nurse Practitioner

## 2020-05-12 DIAGNOSIS — D45 Polycythemia vera: Secondary | ICD-10-CM | POA: Diagnosis not present

## 2020-05-12 NOTE — Assessment & Plan Note (Signed)
1.  [name] 2+ polycythemia vera: - She takes Hydrea 500 mg 4 times a week.  Monday Wednesday Friday and Saturday -Goal is to maintain platelet count of 450 or less. -She is taking Hydrea 500 mg five times a week on Monday, Tuesday, Wednesday, Thursday, Friday.  She will take the weekends off. -She denies any aquagenic pruritus, headaches, night sweats, or leg ulcers. Labs on 05/08/2020 showed hemoglobin 12.4, platelets 320, WBC 6.2, MCV 106.8, creatinine 0.58. -She will follow-up in 3 months with repeat labs.  2.  Macrocytosis: -This is due to the Hydrea. - Labs on 05/08/2020 showed MCV 106.8.

## 2020-05-12 NOTE — Progress Notes (Signed)
[facility] Follow up:    [name], MD [address] [place] [id number]   DIAGNOSIS: Polycythemia vera  CURRENT THERAPY: Hydrea Monday through Friday  INTERVAL HISTORY: [name] 84 y.o. female was called for a telephone visit for polycythemia vera. Patient reports she is taking her medication as prescribed with no unwanted side effects. She denies any aquagenic pruritus. She denies any hot flashes. She denies any ulcers on her legs. Denies any nausea, vomiting, or diarrhea. Denies any new pains. Had not noticed any recent bleeding such as epistaxis, hematuria or hematochezia. Denies recent chest pain on exertion, shortness of breath on minimal exertion, pre-syncopal episodes, or palpitations. Denies any numbness or tingling in hands or feet. Denies any recent fevers, infections, or recent hospitalizations. Patient reports appetite at 75% and energy level at 75%.    Patient Active Problem List   Diagnosis Date Noted  . Uterovaginal prolapse, complete 10/12/2013  . Vaginitis and vulvovaginitis, unspecified 07/05/2013  . Polycythemia vera ([place]) 02/25/2013    is allergic to codeine; diovan [valsartan]; penicillins; ramipril; and ramipril.  MEDICAL HISTORY: Past Medical History:  Diagnosis Date  . Hypertension   . Influenza 11/2012  . Osteoporosis   . Polycythemia   . Polycythemia vera(238.4) 02/25/2013   Diagnosed August 2005 on Hydrea 500 mg 3 times a week with excellent control.   . Prolapse, uterus, congenital    ring placement    SURGICAL HISTORY: Past Surgical History:  Procedure Laterality Date  . APPENDECTOMY    . COLONOSCOPY    . OOPHORECTOMY     left  . opperectomy      SOCIAL HISTORY: Social History   Socioeconomic History  . Marital status: Divorced    Spouse name: Not on file  . Number of children: 1  . Years of education: Not on file  . Highest education level: Not on file  Occupational History  . Not on file   Tobacco Use  . Smoking status: Never Smoker  . Smokeless tobacco: Never Used  Substance and Sexual Activity  . Alcohol use: No  . Drug use: No  . Sexual activity: Yes    Birth control/protection: Post-menopausal  Other Topics Concern  . Not on file  Social History Narrative  . Not on file   Social Determinants of Health   Financial Resource Strain:   . Difficulty of Paying Living Expenses:   Food Insecurity:   . Worried About Charity fundraiser in the Last Year:   . Arboriculturist in the Last Year:   Transportation Needs:   . Film/video editor (Medical):   Marland Kitchen Lack of Transportation (Non-Medical):   Physical Activity:   . Days of Exercise per Week:   . Minutes of Exercise per Session:   Stress:   . Feeling of Stress :   Social Connections:   . Frequency of Communication with Friends and Family:   . Frequency of Social Gatherings with Friends and Family:   . Attends Religious Services:   . Active Member of Clubs or Organizations:   . Attends Archivist Meetings:   Marland Kitchen Marital Status:   Intimate Partner Violence:   . Fear of Current or Ex-Partner:   . Emotionally Abused:   Marland Kitchen Physically Abused:   . Sexually Abused:     FAMILY HISTORY: Family History  Problem Relation Age of Onset  . Cancer Brother        throat cancer  .  Cancer Brother        lung cancer  . Cancer Brother        prostate cancer    Review of Systems  All other systems reviewed and are negative.   Vital signs: -Deferred due to telephone visit  Physical Exam -Deferred due to telephone visit -Patient was alert and oriented over the phone and in no acute distress.   LABORATORY DATA:  CBC    Component Value Date/Time   WBC 6.2 05/08/2020 1302   RBC 3.70 (L) 05/08/2020 1302   HGB 12.4 05/08/2020 1302   HCT 39.5 05/08/2020 1302   PLT 320 05/08/2020 1302   MCV 106.8 (H) 05/08/2020 1302   MCH 33.5 05/08/2020 1302   MCHC 31.4 05/08/2020 1302   RDW 14.5 05/08/2020 1302    LYMPHSABS 1.1 05/08/2020 1302   MONOABS 0.5 05/08/2020 1302   EOSABS 0.1 05/08/2020 1302   BASOSABS 0.1 05/08/2020 1302    CMP     Component Value Date/Time   NA 136 05/08/2020 1302   K 3.8 05/08/2020 1302   CL 102 05/08/2020 1302   CO2 26 05/08/2020 1302   GLUCOSE 86 05/08/2020 1302   BUN 23 05/08/2020 1302   CREATININE 0.58 05/08/2020 1302   CALCIUM 8.6 (L) 05/08/2020 1302   PROT 7.1 05/08/2020 1302   ALBUMIN 4.0 05/08/2020 1302   AST 19 05/08/2020 1302   ALT 13 05/08/2020 1302   ALKPHOS 78 05/08/2020 1302   BILITOT 0.7 05/08/2020 1302   GFRNONAA >60 05/08/2020 1302   GFRAA >60 05/08/2020 1302   All questions were answered to patient's stated satisfaction. Encouraged patient to call with any new concerns or questions before his next visit to the cancer center and we can certain see him sooner, if needed.     ASSESSMENT and THERAPY PLAN:   Polycythemia vera (HCC) 1.  Jak 2+ polycythemia vera: - She takes Hydrea 500 mg 4 times a week.  Monday Wednesday Friday and Saturday -Goal is to maintain platelet count of 450 or less. -She is taking Hydrea 500 mg five times a week on Monday, Tuesday, Wednesday, Thursday, Friday.  She will take the weekends off. -She denies any aquagenic pruritus, headaches, night sweats, or leg ulcers. Labs on 05/08/2020 showed hemoglobin 12.4, platelets 320, WBC 6.2, MCV 106.8, creatinine 0.58. -She will follow-up in 3 months with repeat labs.  2.  Macrocytosis: -This is due to the Hydrea. - Labs on 05/08/2020 showed MCV 106.8.   Orders Placed This Encounter  Procedures  . Lactate dehydrogenase    Standing Status:   Future    Standing Expiration Date:   05/12/2021  . CBC with Differential/Platelet    Standing Status:   Future    Standing Expiration Date:   05/12/2021  . Comprehensive metabolic panel    Standing Status:   Future    Standing Expiration Date:   05/12/2021  . Vitamin B12    Standing Status:   Future    Standing Expiration Date:    05/12/2021  . VITAMIN D 25 Hydroxy (Vit-D Deficiency, Fractures)    Standing Status:   Future    Standing Expiration Date:   05/12/2021  . Ferritin    Standing Status:   Future    Standing Expiration Date:   05/12/2021  . Iron and TIBC    Standing Status:   Future    Standing Expiration Date:   05/12/2021    All questions were answered. The patient knows to call  the clinic with any problems, questions or concerns. We can certainly see the patient much sooner if necessary. This note was electronically signed.  I provided 30 minutes of non face-to-face telephone visit time during this encounter, and > 50% was spent counseling as documented under my assessment & plan.  Glennie Isle, NP-C 05/12/2020

## 2020-05-16 ENCOUNTER — Other Ambulatory Visit (HOSPITAL_COMMUNITY): Payer: Self-pay | Admitting: Nurse Practitioner

## 2020-05-16 DIAGNOSIS — D45 Polycythemia vera: Secondary | ICD-10-CM

## 2020-06-15 ENCOUNTER — Ambulatory Visit: Payer: Medicare Other | Admitting: Obstetrics & Gynecology

## 2020-06-23 ENCOUNTER — Encounter: Payer: Self-pay | Admitting: Obstetrics & Gynecology

## 2020-06-23 ENCOUNTER — Ambulatory Visit (INDEPENDENT_AMBULATORY_CARE_PROVIDER_SITE_OTHER): Payer: Medicare Other | Admitting: Obstetrics & Gynecology

## 2020-06-23 VITALS — BP 137/57 | HR 63 | Ht 64.0 in | Wt 118.6 lb

## 2020-06-23 DIAGNOSIS — N813 Complete uterovaginal prolapse: Secondary | ICD-10-CM

## 2020-06-23 DIAGNOSIS — Z4689 Encounter for fitting and adjustment of other specified devices: Secondary | ICD-10-CM | POA: Diagnosis not present

## 2020-06-23 NOTE — Progress Notes (Signed)
Chief Complaint  Patient presents with   Pessary Check    Blood pressure (!) 137/57, pulse 63, height 5\' 4"  (1.626 m), weight 118 lb 9.6 oz (53.8 kg).  [name] presents today for routine follow up related to her pessary.   She uses a Milex ring with support #2 She reports no vaginal discharge or vaginal bleeding.  Exam reveals no undue vaginal mucosal pressure of breakdown, no discharge and no vaginal bleeding.  The pessary is removed, cleaned and replaced without difficulty.    BURKLEY DECH will be sen back in 4 months for continued follow up.  Florian Buff, MD  06/23/2020 1:18 PM

## 2020-07-14 DIAGNOSIS — M810 Age-related osteoporosis without current pathological fracture: Secondary | ICD-10-CM | POA: Diagnosis not present

## 2020-07-14 DIAGNOSIS — M1991 Primary osteoarthritis, unspecified site: Secondary | ICD-10-CM | POA: Diagnosis not present

## 2020-07-14 DIAGNOSIS — I10 Essential (primary) hypertension: Secondary | ICD-10-CM | POA: Diagnosis not present

## 2020-08-04 ENCOUNTER — Inpatient Hospital Stay (HOSPITAL_COMMUNITY): Payer: Medicare Other | Attending: Hematology

## 2020-08-04 DIAGNOSIS — E559 Vitamin D deficiency, unspecified: Secondary | ICD-10-CM | POA: Insufficient documentation

## 2020-08-04 DIAGNOSIS — D7589 Other specified diseases of blood and blood-forming organs: Secondary | ICD-10-CM | POA: Diagnosis not present

## 2020-08-04 DIAGNOSIS — D45 Polycythemia vera: Secondary | ICD-10-CM | POA: Insufficient documentation

## 2020-08-04 LAB — CBC WITH DIFFERENTIAL/PLATELET
Abs Immature Granulocytes: 0.02 K/uL (ref 0.00–0.07)
Basophils Absolute: 0.1 K/uL (ref 0.0–0.1)
Basophils Relative: 2 %
Eosinophils Absolute: 0.2 K/uL (ref 0.0–0.5)
Eosinophils Relative: 4 %
HCT: 37.7 % (ref 36.0–46.0)
Hemoglobin: 12.3 g/dL (ref 12.0–15.0)
Immature Granulocytes: 0 %
Lymphocytes Relative: 20 %
Lymphs Abs: 1.1 K/uL (ref 0.7–4.0)
MCH: 35 pg — ABNORMAL HIGH (ref 26.0–34.0)
MCHC: 32.6 g/dL (ref 30.0–36.0)
MCV: 107.4 fL — ABNORMAL HIGH (ref 80.0–100.0)
Monocytes Absolute: 0.4 K/uL (ref 0.1–1.0)
Monocytes Relative: 6 %
Neutro Abs: 3.8 K/uL (ref 1.7–7.7)
Neutrophils Relative %: 68 %
Platelets: 329 K/uL (ref 150–400)
RBC: 3.51 MIL/uL — ABNORMAL LOW (ref 3.87–5.11)
RDW: 14.6 % (ref 11.5–15.5)
WBC: 5.6 K/uL (ref 4.0–10.5)
nRBC: 0 % (ref 0.0–0.2)

## 2020-08-04 LAB — COMPREHENSIVE METABOLIC PANEL
ALT: 10 U/L (ref 0–44)
AST: 17 U/L (ref 15–41)
Albumin: 4 g/dL (ref 3.5–5.0)
Alkaline Phosphatase: 78 U/L (ref 38–126)
Anion gap: 9 (ref 5–15)
BUN: 13 mg/dL (ref 8–23)
CO2: 26 mmol/L (ref 22–32)
Calcium: 8.7 mg/dL — ABNORMAL LOW (ref 8.9–10.3)
Chloride: 103 mmol/L (ref 98–111)
Creatinine, Ser: 0.58 mg/dL (ref 0.44–1.00)
GFR calc Af Amer: >60 mL/min (ref >60)
GFR calc non Af Amer: >60 mL/min (ref >60)
Glucose, Bld: 95 mg/dL (ref 70–99)
Potassium: 4 mmol/L (ref 3.5–5.1)
Sodium: 138 mmol/L (ref 135–145)
Total Bilirubin: 0.8 mg/dL (ref 0.3–1.2)
Total Protein: 7 g/dL (ref 6.5–8.1)

## 2020-08-04 LAB — IRON AND TIBC
Iron: 114 ug/dL (ref 28–170)
Saturation Ratios: 34 % — ABNORMAL HIGH (ref 10.4–31.8)
TIBC: 339 ug/dL (ref 250–450)
UIBC: 225 ug/dL

## 2020-08-04 LAB — FERRITIN: Ferritin: 45 ng/mL (ref 11–307)

## 2020-08-04 LAB — LACTATE DEHYDROGENASE: LDH: 141 U/L (ref 98–192)

## 2020-08-04 LAB — VITAMIN D 25 HYDROXY (VIT D DEFICIENCY, FRACTURES): Vit D, 25-Hydroxy: 95.05 ng/mL (ref 30–100)

## 2020-08-04 LAB — VITAMIN B12: Vitamin B-12: 457 pg/mL (ref 180–914)

## 2020-08-10 ENCOUNTER — Inpatient Hospital Stay (HOSPITAL_BASED_OUTPATIENT_CLINIC_OR_DEPARTMENT_OTHER): Payer: Medicare Other | Admitting: Nurse Practitioner

## 2020-08-10 DIAGNOSIS — D45 Polycythemia vera: Secondary | ICD-10-CM

## 2020-08-10 MED ORDER — HYDROXYUREA 500 MG PO CAPS: ORAL_CAPSULE | ORAL | 3 refills | Status: DC

## 2020-08-10 NOTE — Progress Notes (Signed)
[facility] Follow up:    [name], MD [place] [address]   DIAGNOSIS: JAK2 positive polycythemia  CURRENT THERAPY: Hydrea  INTERVAL HISTORY: [name] 84 y.o. female was called for telephone visit for JAK2 positive polycythemia.  Patient reports she is doing well.  She is taking her medication as prescribed with no unwanted side effects.  She denies any leg ulcers.  She denies any headaches or vision changes.  She denies any aquagenic pruritus. Denies any nausea, vomiting, or diarrhea. Denies any new pains. Had not noticed any recent bleeding such as epistaxis, hematuria or hematochezia. Denies recent chest pain on exertion, shortness of breath on minimal exertion, pre-syncopal episodes, or palpitations. Denies any numbness or tingling in hands or feet. Denies any recent fevers, infections, or recent hospitalizations. Patient reports appetite at 100% and energy level at 100%.  She is eating well maintaining her weight at this time.    Patient Active Problem List   Diagnosis Date Noted  . Uterovaginal prolapse, complete 10/12/2013  . Vaginitis and vulvovaginitis, unspecified 07/05/2013  . Polycythemia vera ([place]) 02/25/2013    is allergic to codeine, diovan [valsartan], and penicillins.  MEDICAL HISTORY: Past Medical History:  Diagnosis Date  . Hypertension   . Influenza 11/2012  . Osteoporosis   . Polycythemia   . Polycythemia vera(238.4) 02/25/2013   Diagnosed August 2005 on Hydrea 500 mg 3 times a week with excellent control.   . Prolapse, uterus, congenital    ring placement    SURGICAL HISTORY: Past Surgical History:  Procedure Laterality Date  . APPENDECTOMY    . COLONOSCOPY    . OOPHORECTOMY     left  . opperectomy      SOCIAL HISTORY: Social History   Socioeconomic History  . Marital status: Divorced    Spouse name: Not on file  . Number of children: 1  . Years of education: Not on file  . Highest  education level: Not on file  Occupational History  . Not on file  Tobacco Use  . Smoking status: Never Smoker  . Smokeless tobacco: Never Used  Vaping Use  . Vaping Use: Never used  Substance and Sexual Activity  . Alcohol use: No  . Drug use: No  . Sexual activity: Yes    Birth control/protection: Post-menopausal  Other Topics Concern  . Not on file  Social History Narrative  . Not on file   Social Determinants of Health   Financial Resource Strain:   . Difficulty of Paying Living Expenses: Not on file  Food Insecurity:   . Worried About Charity fundraiser in the Last Year: Not on file  . Ran Out of Food in the Last Year: Not on file  Transportation Needs:   . Lack of Transportation (Medical): Not on file  . Lack of Transportation (Non-Medical): Not on file  Physical Activity:   . Days of Exercise per Week: Not on file  . Minutes of Exercise per Session: Not on file  Stress:   . Feeling of Stress : Not on file  Social Connections:   . Frequency of Communication with Friends and Family: Not on file  . Frequency of Social Gatherings with Friends and Family: Not on file  . Attends Religious Services: Not on file  . Active Member of Clubs or Organizations: Not on file  . Attends Archivist Meetings: Not on file  . Marital Status: Not on file  Intimate Partner  Violence:   . Fear of Current or Ex-Partner: Not on file  . Emotionally Abused: Not on file  . Physically Abused: Not on file  . Sexually Abused: Not on file    FAMILY HISTORY: Family History  Problem Relation Age of Onset  . Cancer Brother        throat cancer  . Cancer Brother        lung cancer  . Cancer Brother        prostate cancer    Review of Systems  All other systems reviewed and are negative.   Vital signs: -Deferred to telephone visit  Physical Exam -Deferred due to telephone visit -Patient was alert and oriented the phone in no acute distress   LABORATORY DATA:  CBC     Component Value Date/Time   WBC 5.6 08/04/2020 1113   RBC 3.51 (L) 08/04/2020 1113   HGB 12.3 08/04/2020 1113   HCT 37.7 08/04/2020 1113   PLT 329 08/04/2020 1113   MCV 107.4 (H) 08/04/2020 1113   MCH 35.0 (H) 08/04/2020 1113   MCHC 32.6 08/04/2020 1113   RDW 14.6 08/04/2020 1113   LYMPHSABS 1.1 08/04/2020 1113   MONOABS 0.4 08/04/2020 1113   EOSABS 0.2 08/04/2020 1113   BASOSABS 0.1 08/04/2020 1113    CMP     Component Value Date/Time   NA 138 08/04/2020 1113   K 4.0 08/04/2020 1113   CL 103 08/04/2020 1113   CO2 26 08/04/2020 1113   GLUCOSE 95 08/04/2020 1113   BUN 13 08/04/2020 1113   CREATININE 0.58 08/04/2020 1113   CALCIUM 8.7 (L) 08/04/2020 1113   PROT 7.0 08/04/2020 1113   ALBUMIN 4.0 08/04/2020 1113   AST 17 08/04/2020 1113   ALT 10 08/04/2020 1113   ALKPHOS 78 08/04/2020 1113   BILITOT 0.8 08/04/2020 1113   GFRNONAA >60 08/04/2020 1113   GFRAA >60 08/04/2020 1113    All questions were answered to patient's stated satisfaction. Encouraged patient to call with any new concerns or questions before his next visit to the cancer center and we can certain see him sooner, if needed.     ASSESSMENT and THERAPY PLAN:   Polycythemia vera (HCC) 1.  Jak 2+ polycythemia vera: - She takes Hydrea 500 mg 4 times a week.  Monday Wednesday Friday and Saturday -Goal is to maintain platelet count of 450 or less. -She is taking Hydrea 500 mg five times a week Monday through Friday.  She will take the weekends off. -She denies any aquagenic pruritus, headaches, night sweats, or leg ulcers. -Labs on 08/04/2020 showed hemoglobin 12.3, platelets 329, WBC 5.6, MCV 107.4, creatinine 0.58. -She will continue the same dosage of Hydrea -She will follow-up in 4 months with repeat labs.  2.  Macrocytosis: -This is due to the Hydrea. - Labs on 08/04/2020 showed MCV 107.4.   Orders Placed This Encounter  Procedures  . Lactate dehydrogenase    Standing Status:   Future    Standing  Expiration Date:   08/10/2021  . CBC with Differential/Platelet    Standing Status:   Future    Standing Expiration Date:   08/10/2021  . Comprehensive metabolic panel    Standing Status:   Future    Standing Expiration Date:   08/10/2021  . Ferritin    Standing Status:   Future    Standing Expiration Date:   08/10/2021  . Iron and TIBC    Standing Status:   Future  Standing Expiration Date:   08/10/2021  . Vitamin B12    Standing Status:   Future    Standing Expiration Date:   08/10/2021  . VITAMIN D 25 Hydroxy (Vit-D Deficiency, Fractures)    Standing Status:   Future    Standing Expiration Date:   08/10/2021    All questions were answered. The patient knows to call the clinic with any problems, questions or concerns. We can certainly see the patient much sooner if necessary. This note was electronically signed.  I provided 29 minutes of non face-to-face telephone visit time during this encounter, and > 50% was spent counseling as documented under my assessment & plan.   Glennie Isle, NP-C 08/10/2020

## 2020-08-10 NOTE — Assessment & Plan Note (Addendum)
1.  [name] 2+ polycythemia vera: - She takes Hydrea 500 mg 4 times a week.  Monday Wednesday Friday and Saturday -Goal is to maintain platelet count of 450 or less. -She is taking Hydrea 500 mg five times a week Monday through Friday.  She will take the weekends off. -She denies any aquagenic pruritus, headaches, night sweats, or leg ulcers. -Labs on 08/04/2020 showed hemoglobin 12.3, platelets 329, WBC 5.6, MCV 107.4, creatinine 0.58. -She will continue the same dosage of Hydrea -She will follow-up in 4 months with repeat labs.  2.  Macrocytosis: -This is due to the Hydrea. - Labs on 08/04/2020 showed MCV 107.4.

## 2020-09-14 DIAGNOSIS — I10 Essential (primary) hypertension: Secondary | ICD-10-CM | POA: Diagnosis not present

## 2020-09-14 DIAGNOSIS — M810 Age-related osteoporosis without current pathological fracture: Secondary | ICD-10-CM | POA: Diagnosis not present

## 2020-09-14 DIAGNOSIS — M1991 Primary osteoarthritis, unspecified site: Secondary | ICD-10-CM | POA: Diagnosis not present

## 2020-10-14 DIAGNOSIS — I10 Essential (primary) hypertension: Secondary | ICD-10-CM | POA: Diagnosis not present

## 2020-10-14 DIAGNOSIS — M1991 Primary osteoarthritis, unspecified site: Secondary | ICD-10-CM | POA: Diagnosis not present

## 2020-10-14 DIAGNOSIS — M810 Age-related osteoporosis without current pathological fracture: Secondary | ICD-10-CM | POA: Diagnosis not present

## 2020-10-24 ENCOUNTER — Ambulatory Visit: Payer: Medicare Other | Admitting: Obstetrics & Gynecology

## 2020-11-14 ENCOUNTER — Ambulatory Visit: Payer: Medicare Other | Admitting: Obstetrics & Gynecology

## 2020-11-15 ENCOUNTER — Other Ambulatory Visit (HOSPITAL_COMMUNITY): Payer: Self-pay | Admitting: *Deleted

## 2020-11-15 DIAGNOSIS — D45 Polycythemia vera: Secondary | ICD-10-CM

## 2020-11-15 MED ORDER — HYDROXYUREA 500 MG PO CAPS: ORAL_CAPSULE | ORAL | 3 refills | Status: DC

## 2020-11-23 DIAGNOSIS — Z96642 Presence of left artificial hip joint: Secondary | ICD-10-CM | POA: Diagnosis not present

## 2020-12-15 DIAGNOSIS — M1991 Primary osteoarthritis, unspecified site: Secondary | ICD-10-CM | POA: Diagnosis not present

## 2020-12-15 DIAGNOSIS — I10 Essential (primary) hypertension: Secondary | ICD-10-CM | POA: Diagnosis not present

## 2020-12-15 DIAGNOSIS — M810 Age-related osteoporosis without current pathological fracture: Secondary | ICD-10-CM | POA: Diagnosis not present

## 2020-12-21 ENCOUNTER — Inpatient Hospital Stay (HOSPITAL_COMMUNITY): Payer: Medicare Other | Attending: Hematology

## 2020-12-21 ENCOUNTER — Other Ambulatory Visit: Payer: Self-pay

## 2020-12-21 DIAGNOSIS — D45 Polycythemia vera: Secondary | ICD-10-CM | POA: Diagnosis not present

## 2020-12-21 DIAGNOSIS — E559 Vitamin D deficiency, unspecified: Secondary | ICD-10-CM | POA: Insufficient documentation

## 2020-12-21 DIAGNOSIS — D7589 Other specified diseases of blood and blood-forming organs: Secondary | ICD-10-CM | POA: Diagnosis not present

## 2020-12-21 LAB — CBC WITH DIFFERENTIAL/PLATELET
Abs Immature Granulocytes: 0.01 10*3/uL (ref 0.00–0.07)
Basophils Absolute: 0.1 10*3/uL (ref 0.0–0.1)
Basophils Relative: 1 %
Eosinophils Absolute: 0.1 10*3/uL (ref 0.0–0.5)
Eosinophils Relative: 2 %
HCT: 39.8 % (ref 36.0–46.0)
Hemoglobin: 12.8 g/dL (ref 12.0–15.0)
Immature Granulocytes: 0 %
Lymphocytes Relative: 17 %
Lymphs Abs: 0.9 10*3/uL (ref 0.7–4.0)
MCH: 35.1 pg — ABNORMAL HIGH (ref 26.0–34.0)
MCHC: 32.2 g/dL (ref 30.0–36.0)
MCV: 109 fL — ABNORMAL HIGH (ref 80.0–100.0)
Monocytes Absolute: 0.4 10*3/uL (ref 0.1–1.0)
Monocytes Relative: 8 %
Neutro Abs: 3.6 10*3/uL (ref 1.7–7.7)
Neutrophils Relative %: 72 %
Platelets: 324 10*3/uL (ref 150–400)
RBC: 3.65 MIL/uL — ABNORMAL LOW (ref 3.87–5.11)
RDW: 14.2 % (ref 11.5–15.5)
WBC: 5.1 10*3/uL (ref 4.0–10.5)
nRBC: 0 % (ref 0.0–0.2)

## 2020-12-21 LAB — COMPREHENSIVE METABOLIC PANEL
ALT: 12 U/L (ref 0–44)
AST: 17 U/L (ref 15–41)
Albumin: 4 g/dL (ref 3.5–5.0)
Alkaline Phosphatase: 75 U/L (ref 38–126)
Anion gap: 8 (ref 5–15)
BUN: 16 mg/dL (ref 8–23)
CO2: 26 mmol/L (ref 22–32)
Calcium: 8.5 mg/dL — ABNORMAL LOW (ref 8.9–10.3)
Chloride: 103 mmol/L (ref 98–111)
Creatinine, Ser: 0.6 mg/dL (ref 0.44–1.00)
GFR, Estimated: >60 mL/min (ref >60)
Glucose, Bld: 98 mg/dL (ref 70–99)
Potassium: 3.7 mmol/L (ref 3.5–5.1)
Sodium: 137 mmol/L (ref 135–145)
Total Bilirubin: 1.1 mg/dL (ref 0.3–1.2)
Total Protein: 7.1 g/dL (ref 6.5–8.1)

## 2020-12-21 LAB — FERRITIN: Ferritin: 45 ng/mL (ref 11–307)

## 2020-12-21 LAB — IRON AND TIBC
Iron: 123 ug/dL (ref 28–170)
Saturation Ratios: 39 % — ABNORMAL HIGH (ref 10.4–31.8)
TIBC: 317 ug/dL (ref 250–450)
UIBC: 194 ug/dL

## 2020-12-21 LAB — LACTATE DEHYDROGENASE: LDH: 143 U/L (ref 98–192)

## 2020-12-21 LAB — VITAMIN B12: Vitamin B-12: 439 pg/mL (ref 180–914)

## 2020-12-21 LAB — VITAMIN D 25 HYDROXY (VIT D DEFICIENCY, FRACTURES): Vit D, 25-Hydroxy: 127.02 ng/mL — ABNORMAL HIGH (ref 30–100)

## 2020-12-28 ENCOUNTER — Inpatient Hospital Stay (HOSPITAL_BASED_OUTPATIENT_CLINIC_OR_DEPARTMENT_OTHER): Payer: Medicare Other | Admitting: Oncology

## 2020-12-28 ENCOUNTER — Other Ambulatory Visit: Payer: Self-pay

## 2020-12-28 DIAGNOSIS — D45 Polycythemia vera: Secondary | ICD-10-CM | POA: Diagnosis not present

## 2020-12-28 NOTE — Progress Notes (Signed)
[facility] Follow up:    Briana Sites, MD [place] [id number]   DIAGNOSIS: JAK2 positive polycythemia  CURRENT THERAPY: Hydrea  INTERVAL HISTORY: [name] 85 y.o. female was called for telephone visit for JAK2 positive polycythemia.     Patient reports she is doing well.  She is taking her medication as prescribed with no unwanted side effects. She denies any leg ulcers.  She denies any headaches or vision changes.  She denies any aquagenic pruritus. Denies any nausea, vomiting, or diarrhea. Denies any new pains. Had not noticed any recent bleeding such as epistaxis, hematuria or hematochezia. Denies recent chest pain on exertion, shortness of breath on minimal exertion, pre-syncopal episodes, or palpitations. Denies any numbness or tingling in hands or feet. Denies any recent fevers, infections, or recent hospitalizations. Patient reports appetite at 100% and energy level at 100%.  She is eating well maintaining her weight at this time.  Patient Active Problem List   Diagnosis Date Noted  . Uterovaginal prolapse, complete 10/12/2013  . Vaginitis and vulvovaginitis, unspecified 07/05/2013  . Polycythemia vera ([place]) 02/25/2013    is allergic to codeine, diovan [valsartan], and penicillins.  MEDICAL HISTORY: Past Medical History:  Diagnosis Date  . Hypertension   . Influenza 11/2012  . Osteoporosis   . Polycythemia   . Polycythemia vera(238.4) 02/25/2013   Diagnosed August 2005 on Hydrea 500 mg 3 times a week with excellent control.   . Prolapse, uterus, congenital    ring placement    SURGICAL HISTORY: Past Surgical History:  Procedure Laterality Date  . APPENDECTOMY    . COLONOSCOPY    . OOPHORECTOMY     left  . opperectomy      SOCIAL HISTORY: Social History   Socioeconomic History  . Marital status: Divorced    Spouse name: Not on file  . Number of children: 1  . Years of education: Not on file  . Highest  education level: Not on file  Occupational History  . Not on file  Tobacco Use  . Smoking status: Never Smoker  . Smokeless tobacco: Never Used  Vaping Use  . Vaping Use: Never used  Substance and Sexual Activity  . Alcohol use: No  . Drug use: No  . Sexual activity: Yes    Birth control/protection: Post-menopausal  Other Topics Concern  . Not on file  Social History Narrative  . Not on file   Social Determinants of Health   Financial Resource Strain: Not on file  Food Insecurity: Not on file  Transportation Needs: Not on file  Physical Activity: Not on file  Stress: Not on file  Social Connections: Not on file  Intimate Partner Violence: Not on file    FAMILY HISTORY: Family History  Problem Relation Age of Onset  . Cancer Brother        throat cancer  . Cancer Brother        lung cancer  . Cancer Brother        prostate cancer    Review of Systems  All other systems reviewed and are negative.   Vital signs: -Deferred to telephone visit  Physical Exam -Deferred due to telephone visit -Patient was alert and oriented the phone in no acute distress   LABORATORY DATA:  CBC    Component Value Date/Time   WBC 5.1 12/21/2020 1141   RBC 3.65 (L) 12/21/2020 1141   HGB 12.8 12/21/2020 1141   HCT 39.8 12/21/2020 1141  PLT 324 12/21/2020 1141   MCV 109.0 (H) 12/21/2020 1141   MCH 35.1 (H) 12/21/2020 1141   MCHC 32.2 12/21/2020 1141   RDW 14.2 12/21/2020 1141   LYMPHSABS 0.9 12/21/2020 1141   MONOABS 0.4 12/21/2020 1141   EOSABS 0.1 12/21/2020 1141   BASOSABS 0.1 12/21/2020 1141    CMP     Component Value Date/Time   NA 137 12/21/2020 1141   K 3.7 12/21/2020 1141   CL 103 12/21/2020 1141   CO2 26 12/21/2020 1141   GLUCOSE 98 12/21/2020 1141   BUN 16 12/21/2020 1141   CREATININE 0.60 12/21/2020 1141   CALCIUM 8.5 (L) 12/21/2020 1141   PROT 7.1 12/21/2020 1141   ALBUMIN 4.0 12/21/2020 1141   AST 17 12/21/2020 1141   ALT 12 12/21/2020 1141    ALKPHOS 75 12/21/2020 1141   BILITOT 1.1 12/21/2020 1141   GFRNONAA >60 12/21/2020 1141   GFRAA >60 08/04/2020 1113    All questions were answered to patient's stated satisfaction. Encouraged patient to call with any new concerns or questions before his next visit to the cancer center and we can certain see him sooner, if needed.     ASSESSMENT and THERAPY PLAN:  1.  Jak 2+ polycythemia vera: -Goal is to maintain platelet count of 450 or less. -She is taking Hydrea 500 mg five times a week Monday through Friday.  She will take the weekends off. -She denies any aquagenic pruritus, headaches, night sweats, or leg ulcers. -Labs on 12/21/20 show platley count of 324, hemoglobin 12.8. -She will continue the same dosage of Hydrea -She will follow-up in 4 months with repeat labs.  2.  Macrocytosis: -This is due to the Hydrea. - Labs on 08/04/2020 showed MCV 109.0.  Disposition: RTC in 4 months for labs and virtual visit.   Greater than 50% was spent in counseling and coordination of care with this patient including but not limited to discussion of the relevant topics above (See A&P) including, but not limited to diagnosis and management of acute and chronic medical conditions.    No problem-specific Assessment & Plan notes found for this encounter.   No orders of the defined types were placed in this encounter.   All questions were answered. The patient knows to call the clinic with any problems, questions or concerns. We can certainly see the patient much sooner if necessary. This note was electronically signed.  I provided 29 minutes of non face-to-face telephone visit time during this encounter, and > 50% was spent counseling as documented under my assessment & plan.   Jacquelin Hawking, NP 12/28/2020

## 2021-01-11 ENCOUNTER — Encounter: Payer: Self-pay | Admitting: Obstetrics & Gynecology

## 2021-01-11 ENCOUNTER — Ambulatory Visit (INDEPENDENT_AMBULATORY_CARE_PROVIDER_SITE_OTHER): Payer: Medicare Other | Admitting: Obstetrics & Gynecology

## 2021-01-11 ENCOUNTER — Other Ambulatory Visit: Payer: Self-pay

## 2021-01-11 VITALS — BP 145/71 | HR 60 | Wt 115.0 lb

## 2021-01-11 DIAGNOSIS — N813 Complete uterovaginal prolapse: Secondary | ICD-10-CM | POA: Diagnosis not present

## 2021-01-11 DIAGNOSIS — Z4689 Encounter for fitting and adjustment of other specified devices: Secondary | ICD-10-CM

## 2021-01-11 NOTE — Progress Notes (Signed)
Chief Complaint  Patient presents with  . Pessary Check    Blood pressure (!) 145/71, pulse 60, weight 115 lb (52.2 kg).  [name] presents today for routine follow up related to her pessary.   She uses a Milex ring with support #2 She reports no vaginal discharge and no vaginal bleeding   Likert scale(1 not bothersome -5 very bothersome)  :  1  Exam reveals no undue vaginal mucosal pressure of breakdown, no discharge and no vaginal bleeding.  Vaginal Epithelial Abnormality Classification System:   0 0    No abnormalities 1    Epithelial erythema 2    Granulation tissue 3    Epithelial break or erosion, 1 cm or less 4    Epithelial break or erosion, 1 cm or greater  The pessary is removed, cleaned and replaced without difficulty.    No diagnosis found.   [name] will be sen back in 4 months for continued follow up.  Florian Buff, MD  01/11/2021 4:08 PM

## 2021-01-12 ENCOUNTER — Telehealth: Payer: Self-pay | Admitting: Family Medicine

## 2021-01-12 NOTE — Telephone Encounter (Signed)
Pt got all 3 Moderna covid vaccines [facility] [place].

## 2021-02-12 DIAGNOSIS — M810 Age-related osteoporosis without current pathological fracture: Secondary | ICD-10-CM | POA: Diagnosis not present

## 2021-02-12 DIAGNOSIS — I10 Essential (primary) hypertension: Secondary | ICD-10-CM | POA: Diagnosis not present

## 2021-02-12 DIAGNOSIS — M1991 Primary osteoarthritis, unspecified site: Secondary | ICD-10-CM | POA: Diagnosis not present

## 2021-02-28 ENCOUNTER — Other Ambulatory Visit (HOSPITAL_COMMUNITY): Payer: Self-pay

## 2021-02-28 DIAGNOSIS — E559 Vitamin D deficiency, unspecified: Secondary | ICD-10-CM

## 2021-02-28 MED ORDER — ERGOCALCIFEROL 1.25 MG (50000 UT) PO CAPS: 50000.0000 [IU] | ORAL_CAPSULE | ORAL | 4 refills | Status: DC

## 2021-04-14 DIAGNOSIS — M810 Age-related osteoporosis without current pathological fracture: Secondary | ICD-10-CM | POA: Diagnosis not present

## 2021-04-14 DIAGNOSIS — M1991 Primary osteoarthritis, unspecified site: Secondary | ICD-10-CM | POA: Diagnosis not present

## 2021-04-14 DIAGNOSIS — I10 Essential (primary) hypertension: Secondary | ICD-10-CM | POA: Diagnosis not present

## 2021-04-19 DIAGNOSIS — Z681 Body mass index (BMI) 19 or less, adult: Secondary | ICD-10-CM | POA: Diagnosis not present

## 2021-04-19 DIAGNOSIS — Z0001 Encounter for general adult medical examination with abnormal findings: Secondary | ICD-10-CM | POA: Diagnosis not present

## 2021-04-24 DIAGNOSIS — Z13220 Encounter for screening for lipoid disorders: Secondary | ICD-10-CM | POA: Diagnosis not present

## 2021-04-24 DIAGNOSIS — E785 Hyperlipidemia, unspecified: Secondary | ICD-10-CM | POA: Diagnosis not present

## 2021-04-30 ENCOUNTER — Other Ambulatory Visit (HOSPITAL_COMMUNITY): Payer: Self-pay

## 2021-04-30 DIAGNOSIS — E559 Vitamin D deficiency, unspecified: Secondary | ICD-10-CM

## 2021-04-30 DIAGNOSIS — D45 Polycythemia vera: Secondary | ICD-10-CM

## 2021-05-01 ENCOUNTER — Inpatient Hospital Stay (HOSPITAL_COMMUNITY): Payer: Medicare Other | Attending: Hematology

## 2021-05-01 ENCOUNTER — Other Ambulatory Visit: Payer: Self-pay

## 2021-05-01 DIAGNOSIS — E559 Vitamin D deficiency, unspecified: Secondary | ICD-10-CM | POA: Insufficient documentation

## 2021-05-01 DIAGNOSIS — D7589 Other specified diseases of blood and blood-forming organs: Secondary | ICD-10-CM | POA: Insufficient documentation

## 2021-05-01 DIAGNOSIS — D45 Polycythemia vera: Secondary | ICD-10-CM | POA: Insufficient documentation

## 2021-05-01 LAB — CBC WITH DIFFERENTIAL/PLATELET
Abs Immature Granulocytes: 0.03 10*3/uL (ref 0.00–0.07)
Basophils Absolute: 0.1 10*3/uL (ref 0.0–0.1)
Basophils Relative: 2 %
Eosinophils Absolute: 0.2 10*3/uL (ref 0.0–0.5)
Eosinophils Relative: 3 %
HCT: 37.2 % (ref 36.0–46.0)
Hemoglobin: 12 g/dL (ref 12.0–15.0)
Immature Granulocytes: 1 %
Lymphocytes Relative: 22 %
Lymphs Abs: 1.1 10*3/uL (ref 0.7–4.0)
MCH: 35.8 pg — ABNORMAL HIGH (ref 26.0–34.0)
MCHC: 32.3 g/dL (ref 30.0–36.0)
MCV: 111 fL — ABNORMAL HIGH (ref 80.0–100.0)
Monocytes Absolute: 0.4 10*3/uL (ref 0.1–1.0)
Monocytes Relative: 8 %
Neutro Abs: 3.4 10*3/uL (ref 1.7–7.7)
Neutrophils Relative %: 64 %
Platelets: 297 10*3/uL (ref 150–400)
RBC: 3.35 MIL/uL — ABNORMAL LOW (ref 3.87–5.11)
RDW: 14.6 % (ref 11.5–15.5)
WBC: 5.2 10*3/uL (ref 4.0–10.5)
nRBC: 0 % (ref 0.0–0.2)

## 2021-05-01 LAB — COMPREHENSIVE METABOLIC PANEL
ALT: 12 U/L (ref 0–44)
AST: 17 U/L (ref 15–41)
Albumin: 3.8 g/dL (ref 3.5–5.0)
Alkaline Phosphatase: 78 U/L (ref 38–126)
Anion gap: 8 (ref 5–15)
BUN: 16 mg/dL (ref 8–23)
CO2: 28 mmol/L (ref 22–32)
Calcium: 8.7 mg/dL — ABNORMAL LOW (ref 8.9–10.3)
Chloride: 102 mmol/L (ref 98–111)
Creatinine, Ser: 0.68 mg/dL (ref 0.44–1.00)
GFR, Estimated: >60 mL/min (ref >60)
Glucose, Bld: 88 mg/dL (ref 70–99)
Potassium: 3.7 mmol/L (ref 3.5–5.1)
Sodium: 138 mmol/L (ref 135–145)
Total Bilirubin: 0.7 mg/dL (ref 0.3–1.2)
Total Protein: 6.8 g/dL (ref 6.5–8.1)

## 2021-05-01 LAB — FERRITIN: Ferritin: 58 ng/mL (ref 11–307)

## 2021-05-01 LAB — IRON AND TIBC
Iron: 111 ug/dL (ref 28–170)
Saturation Ratios: 37 % — ABNORMAL HIGH (ref 10.4–31.8)
TIBC: 301 ug/dL (ref 250–450)
UIBC: 190 ug/dL

## 2021-05-01 LAB — LACTATE DEHYDROGENASE: LDH: 148 U/L (ref 98–192)

## 2021-05-01 LAB — VITAMIN D 25 HYDROXY (VIT D DEFICIENCY, FRACTURES): Vit D, 25-Hydroxy: 119.01 ng/mL — ABNORMAL HIGH (ref 30–100)

## 2021-05-01 LAB — VITAMIN B12: Vitamin B-12: 388 pg/mL (ref 180–914)

## 2021-05-08 ENCOUNTER — Other Ambulatory Visit: Payer: Self-pay

## 2021-05-08 ENCOUNTER — Inpatient Hospital Stay (HOSPITAL_BASED_OUTPATIENT_CLINIC_OR_DEPARTMENT_OTHER): Payer: Medicare Other | Admitting: Hematology

## 2021-05-08 ENCOUNTER — Encounter (HOSPITAL_COMMUNITY): Payer: Self-pay | Admitting: Hematology

## 2021-05-08 DIAGNOSIS — D45 Polycythemia vera: Secondary | ICD-10-CM | POA: Diagnosis not present

## 2021-05-08 DIAGNOSIS — E559 Vitamin D deficiency, unspecified: Secondary | ICD-10-CM

## 2021-05-08 NOTE — Progress Notes (Signed)
Virtual Visit via Telephone Note  I connected with [name] on 05/08/21 at  4:15 PM EDT by telephone and verified that I am speaking with the correct person using two identifiers.  Location: Patient: At home Provider: In the office   I discussed the limitations, risks, security and privacy concerns of performing an evaluation and management service by telephone and the availability of in person appointments. I also discussed with the patient that there may be a patient responsible charge related to this service. The patient expressed understanding and agreed to proceed.   History of Present Illness: She is seen in our clinic for JAK2 positive polycythemia vera.  She denies any history of thrombosis.  She has been on Hydrea for several years.   Observations/Objective: She reports taking Hydrea 1 tablet Monday through Friday.  She denies any GI side effects including nausea or vomiting.  She is also taking aspirin 81 mg daily.  Denies any aquagenic pruritus.  Denies any vasomotor symptoms.  Assessment and Plan:  1.  JAK2 positive polycythemia vera: - We reviewed her labs from 05/01/2021. - Hematocrit is 37 and platelet count 297, well within the target range. - MCV is elevated secondary to hydroxyurea. - Continue hydroxyurea 500 mg Monday through Friday.  Continue aspirin 81 mg daily. - RTC 4 months for follow-up with repeat labs.  2.  Vitamin D deficiency: - She is taking vitamin D 50,000 units every Monday. - Her vitamin D level was 120. - Recommend decreasing vitamin D to every 2 weeks.  We will check level next visit.   Follow Up Instructions: RTC 4 months with labs.   I discussed the assessment and treatment plan with the patient. The patient was provided an opportunity to ask questions and all were answered. The patient agreed with the plan and demonstrated an understanding of the instructions.   The patient was advised to call back or seek an in-person evaluation if the  symptoms worsen or if the condition fails to improve as anticipated.  I provided 12 minutes of non-face-to-face time during this encounter.   Derek Jack, MD

## 2021-05-15 ENCOUNTER — Ambulatory Visit (INDEPENDENT_AMBULATORY_CARE_PROVIDER_SITE_OTHER): Payer: Medicare Other | Admitting: Obstetrics & Gynecology

## 2021-05-15 ENCOUNTER — Other Ambulatory Visit: Payer: Self-pay

## 2021-05-15 ENCOUNTER — Encounter: Payer: Self-pay | Admitting: Obstetrics & Gynecology

## 2021-05-15 VITALS — BP 131/68 | HR 69 | Ht 64.0 in | Wt 115.5 lb

## 2021-05-15 DIAGNOSIS — N813 Complete uterovaginal prolapse: Secondary | ICD-10-CM

## 2021-05-15 DIAGNOSIS — Z4689 Encounter for fitting and adjustment of other specified devices: Secondary | ICD-10-CM | POA: Diagnosis not present

## 2021-05-15 NOTE — Progress Notes (Signed)
Chief Complaint  Patient presents with  . Pessary Check    Blood pressure 131/68, pulse 69, height 5\' 4"  (1.626 m), weight 115 lb 8 oz (52.4 kg).  [name] presents today for routine follow up related to her pessary.   She uses a Milex ring with support #2 She reports no vaginal discharge and no vaginal bleeding   Likert scale(1 not bothersome -5 very bothersome)  :  1  Exam reveals no undue vaginal mucosal pressure of breakdown, no discharge and no vaginal bleeding.  Vaginal Epithelial Abnormality Classification System:   0 0    No abnormalities 1    Epithelial erythema 2    Granulation tissue 3    Epithelial break or erosion, 1 cm or less 4    Epithelial break or erosion, 1 cm or greater  The pessary is removed, cleaned and replaced without difficulty.      ICD-10-CM   1. Pessary maintenance, Milex ring with support #2  Z46.89   2. Uterovaginal prolapse, complete  N81.3      TIFFENY MINCHEW will be sen back in 4 months for continued follow up.  Florian Buff, MD  05/15/2021 4:19 PM

## 2021-06-14 DIAGNOSIS — I10 Essential (primary) hypertension: Secondary | ICD-10-CM | POA: Diagnosis not present

## 2021-06-14 DIAGNOSIS — M1991 Primary osteoarthritis, unspecified site: Secondary | ICD-10-CM | POA: Diagnosis not present

## 2021-06-14 DIAGNOSIS — M810 Age-related osteoporosis without current pathological fracture: Secondary | ICD-10-CM | POA: Diagnosis not present

## 2021-08-15 DIAGNOSIS — M1991 Primary osteoarthritis, unspecified site: Secondary | ICD-10-CM | POA: Diagnosis not present

## 2021-08-15 DIAGNOSIS — M810 Age-related osteoporosis without current pathological fracture: Secondary | ICD-10-CM | POA: Diagnosis not present

## 2021-08-15 DIAGNOSIS — I10 Essential (primary) hypertension: Secondary | ICD-10-CM | POA: Diagnosis not present

## 2021-09-13 ENCOUNTER — Encounter: Payer: Self-pay | Admitting: Obstetrics & Gynecology

## 2021-09-13 ENCOUNTER — Other Ambulatory Visit: Payer: Self-pay

## 2021-09-13 ENCOUNTER — Ambulatory Visit (INDEPENDENT_AMBULATORY_CARE_PROVIDER_SITE_OTHER): Payer: Medicare Other | Admitting: Obstetrics & Gynecology

## 2021-09-13 VITALS — BP 147/71 | HR 59 | Ht 64.0 in | Wt 119.0 lb

## 2021-09-13 DIAGNOSIS — N813 Complete uterovaginal prolapse: Secondary | ICD-10-CM

## 2021-09-13 DIAGNOSIS — Z4689 Encounter for fitting and adjustment of other specified devices: Secondary | ICD-10-CM | POA: Diagnosis not present

## 2021-09-13 NOTE — Progress Notes (Signed)
Chief Complaint  Patient presents with   Pessary Check    Blood pressure (!) 147/71, pulse (!) 59, height 5\' 4"  (1.626 m), weight 119 lb (54 kg).  [name] presents today for routine follow up related to her pessary.   She uses a Milex ring with support #2 She reports no vaginal discharge and no vaginal bleeding   Likert scale(1 not bothersome -5 very bothersome)  :  1  Exam reveals no undue vaginal mucosal pressure of breakdown, no discharge and no vaginal bleeding.  Vaginal Epithelial Abnormality Classification System:   0 0    No abnormalities 1    Epithelial erythema 2    Granulation tissue 3    Epithelial break or erosion, 1 cm or less 4    Epithelial break or erosion, 1 cm or greater  The pessary is removed, cleaned and replaced without difficulty.      ICD-10-CM   1. Pessary maintenance, Milex ring with support #2  Z46.89     2. Uterovaginal prolapse, complete  N81.3        [name] will be sen back in 4 months for continued follow up.  Florian Buff, MD  09/13/2021 4:21 PM

## 2021-09-18 ENCOUNTER — Inpatient Hospital Stay (HOSPITAL_COMMUNITY): Payer: Medicare Other | Attending: Hematology

## 2021-09-18 ENCOUNTER — Other Ambulatory Visit: Payer: Self-pay

## 2021-09-18 DIAGNOSIS — Z7982 Long term (current) use of aspirin: Secondary | ICD-10-CM | POA: Insufficient documentation

## 2021-09-18 DIAGNOSIS — D45 Polycythemia vera: Secondary | ICD-10-CM | POA: Diagnosis not present

## 2021-09-18 DIAGNOSIS — Z79899 Other long term (current) drug therapy: Secondary | ICD-10-CM | POA: Insufficient documentation

## 2021-09-18 DIAGNOSIS — E559 Vitamin D deficiency, unspecified: Secondary | ICD-10-CM | POA: Diagnosis not present

## 2021-09-18 LAB — LACTATE DEHYDROGENASE: LDH: 144 U/L (ref 98–192)

## 2021-09-18 LAB — COMPREHENSIVE METABOLIC PANEL
ALT: 13 U/L (ref 0–44)
AST: 18 U/L (ref 15–41)
Albumin: 4 g/dL (ref 3.5–5.0)
Alkaline Phosphatase: 85 U/L (ref 38–126)
Anion gap: 7 (ref 5–15)
BUN: 15 mg/dL (ref 8–23)
CO2: 27 mmol/L (ref 22–32)
Calcium: 8.5 mg/dL — ABNORMAL LOW (ref 8.9–10.3)
Chloride: 103 mmol/L (ref 98–111)
Creatinine, Ser: 0.63 mg/dL (ref 0.44–1.00)
GFR, Estimated: >60 mL/min (ref >60)
Glucose, Bld: 96 mg/dL (ref 70–99)
Potassium: 3.7 mmol/L (ref 3.5–5.1)
Sodium: 137 mmol/L (ref 135–145)
Total Bilirubin: 0.9 mg/dL (ref 0.3–1.2)
Total Protein: 7.2 g/dL (ref 6.5–8.1)

## 2021-09-18 LAB — CBC WITH DIFFERENTIAL/PLATELET
Abs Immature Granulocytes: 0.02 10*3/uL (ref 0.00–0.07)
Basophils Absolute: 0.1 10*3/uL (ref 0.0–0.1)
Basophils Relative: 1 %
Eosinophils Absolute: 0.1 10*3/uL (ref 0.0–0.5)
Eosinophils Relative: 3 %
HCT: 37.2 % (ref 36.0–46.0)
Hemoglobin: 12.2 g/dL (ref 12.0–15.0)
Immature Granulocytes: 0 %
Lymphocytes Relative: 19 %
Lymphs Abs: 0.9 10*3/uL (ref 0.7–4.0)
MCH: 36.2 pg — ABNORMAL HIGH (ref 26.0–34.0)
MCHC: 32.8 g/dL (ref 30.0–36.0)
MCV: 110.4 fL — ABNORMAL HIGH (ref 80.0–100.0)
Monocytes Absolute: 0.4 10*3/uL (ref 0.1–1.0)
Monocytes Relative: 8 %
Neutro Abs: 3.5 10*3/uL (ref 1.7–7.7)
Neutrophils Relative %: 69 %
Platelets: 292 10*3/uL (ref 150–400)
RBC: 3.37 MIL/uL — ABNORMAL LOW (ref 3.87–5.11)
RDW: 14.6 % (ref 11.5–15.5)
WBC: 5 10*3/uL (ref 4.0–10.5)
nRBC: 0 % (ref 0.0–0.2)

## 2021-09-18 LAB — VITAMIN D 25 HYDROXY (VIT D DEFICIENCY, FRACTURES): Vit D, 25-Hydroxy: 78.86 ng/mL (ref 30–100)

## 2021-09-24 NOTE — Progress Notes (Signed)
[place], [place] [address]   CLINIC:  Medical Oncology/Hematology  PCP:  [name], [place] / [name] [id number]  [phone]  REASON FOR VISIT:  Follow-up for JAK2 positive polycythemia vera  PRIOR THERAPY: none  CURRENT THERAPY: Hydrea  INTERVAL HISTORY:  [name], a 85 y.o. female, returns for routine follow-up for her JAK2 positive polycythemia vera. [place] was last seen on 05/08/2021.  Today she reports feeling good. She denies itching, skin sores, pain in her fingertips, changing color in her fingertips, recent infections, and history of blood clots. She has been taking Hydrea for 17 years.   REVIEW OF SYSTEMS:  Review of Systems  Constitutional:  Negative for appetite change and fatigue.  Skin:  Negative for itching.  All other systems reviewed and are negative.  PAST MEDICAL/SURGICAL HISTORY:  Past Medical History:  Diagnosis Date   Hypertension    Influenza 11/2012   Osteoporosis    Polycythemia    Polycythemia vera(238.4) 02/25/2013   Diagnosed August 2005 on Hydrea 500 mg 3 times a week with excellent control.    Prolapse, uterus, congenital    ring placement   Past Surgical History:  Procedure Laterality Date   APPENDECTOMY     COLONOSCOPY     OOPHORECTOMY     left   opperectomy      SOCIAL HISTORY:  Social History   Socioeconomic History   Marital status: Divorced    Spouse name: Not on file   Number of children: 1   Years of education: Not on file   Highest education level: Not on file  Occupational History   Not on file  Tobacco Use   Smoking status: Never   Smokeless tobacco: Never  Vaping Use   Vaping Use: Never used  Substance and Sexual Activity   Alcohol use: No   Drug use: No   Sexual activity: Yes    Birth control/protection: Post-menopausal  Other Topics Concern   Not on file  Social History Narrative   Not on file   Social Determinants of Health    Financial Resource Strain: Not on file  Food Insecurity: Not on file  Transportation Needs: Not on file  Physical Activity: Not on file  Stress: Not on file  Social Connections: Not on file  Intimate Partner Violence: Not on file    FAMILY HISTORY:  Family History  Problem Relation Age of Onset   Cancer Brother        throat cancer   Cancer Brother        lung cancer   Cancer Brother        prostate cancer    CURRENT MEDICATIONS:  Current Outpatient Medications  Medication Sig Dispense Refill   amLODipine (NORVASC) 5 MG tablet Take 5 mg by mouth daily.      aspirin 81 MG tablet Take 81 mg by mouth daily.     diltiazem (CARDIZEM CD) 180 MG 24 hr capsule Take 180 mg by mouth daily.      ergocalciferol (VITAMIN D2) 1.25 MG (50000 UT) capsule Take 1 capsule (50,000 Units total) by mouth once a week. (Patient taking differently: Take 50,000 Units by mouth. Every other week) 16 capsule 4   hydroxyurea (HYDREA) 500 MG capsule TAKE ONE CAPSULE BY MOUTH ON MONDAY, TUESDAY, WEDNESDAY, THURSDAY & FRIDAY ONLY. 64 capsule 3   mometasone (NASONEX) 50 MCG/ACT nasal spray 2 sprays.     No current  facility-administered medications for this visit.    ALLERGIES:  Allergies  Allergen Reactions   Codeine     Nausea and vomiting   Diovan [Valsartan]     Rash and itching    Penicillins     Rash and "breaking out in whelps"     PHYSICAL EXAM:  Performance status (ECOG): 1 - Symptomatic but completely ambulatory  There were no vitals filed for this visit. Wt Readings from Last 3 Encounters:  09/13/21 119 lb (54 kg)  05/15/21 115 lb 8 oz (52.4 kg)  01/11/21 115 lb (52.2 kg)   Physical Exam Vitals reviewed.  Constitutional:      Appearance: Normal appearance.  Cardiovascular:     Rate and Rhythm: Normal rate and regular rhythm.     Pulses: Normal pulses.     Heart sounds: Normal heart sounds.  Pulmonary:     Effort: Pulmonary effort is normal.     Breath sounds: Normal  breath sounds.  Abdominal:     Palpations: Abdomen is soft. There is no hepatomegaly or splenomegaly.     Tenderness: There is no abdominal tenderness.  Neurological:     General: No focal deficit present.     Mental Status: She is alert and oriented to person, place, and time.  Psychiatric:        Mood and Affect: Mood normal.        Behavior: Behavior normal.    LABORATORY DATA:  I have reviewed the labs as listed.  CBC Latest Ref Rng & Units 09/18/2021 05/01/2021 12/21/2020  WBC 4.0 - 10.5 K/uL 5.0 5.2 5.1  Hemoglobin 12.0 - 15.0 g/dL 12.2 12.0 12.8  Hematocrit 36.0 - 46.0 % 37.2 37.2 39.8  Platelets 150 - 400 K/uL 292 297 324   CMP Latest Ref Rng & Units 09/18/2021 05/01/2021 12/21/2020  Glucose 70 - 99 mg/dL 96 88 98  BUN 8 - 23 mg/dL 15 16 16   Creatinine 0.44 - 1.00 mg/dL 0.63 0.68 0.60  Sodium 135 - 145 mmol/L 137 138 137  Potassium 3.5 - 5.1 mmol/L 3.7 3.7 3.7  Chloride 98 - 111 mmol/L 103 102 103  CO2 22 - 32 mmol/L 27 28 26   Calcium 8.9 - 10.3 mg/dL 8.5(L) 8.7(L) 8.5(L)  Total Protein 6.5 - 8.1 g/dL 7.2 6.8 7.1  Total Bilirubin 0.3 - 1.2 mg/dL 0.9 0.7 1.1  Alkaline Phos 38 - 126 U/L 85 78 75  AST 15 - 41 U/L 18 17 17   ALT 0 - 44 U/L 13 12 12       Component Value Date/Time   RBC 3.37 (L) 09/18/2021 1105   MCV 110.4 (H) 09/18/2021 1105   MCH 36.2 (H) 09/18/2021 1105   MCHC 32.8 09/18/2021 1105   RDW 14.6 09/18/2021 1105   LYMPHSABS 0.9 09/18/2021 1105   MONOABS 0.4 09/18/2021 1105   EOSABS 0.1 09/18/2021 1105   BASOSABS 0.1 09/18/2021 1105    DIAGNOSTIC IMAGING:  I have independently reviewed the scans and discussed with the patient. No results found.   ASSESSMENT: 1.  JAK2 positive polycythemia vera: - She reports that she has been on hydroxyurea for 17 years. - Denies any history of thrombosis.    PLAN:  1.  JAK2 positive polycythemia vera: - She does not report any aquagenic pruritus or erythromelalgia's. - Reviewed labs from 09/18/2021.  Hematocrit is  37 and platelet count 292.  White count is normal. - No palpable splenomegaly. - Continue hydroxyurea 1 tablet daily Monday through Friday. -  Continue aspirin 81 mg daily. - RTC 6 months for follow-up with repeat labs.    2.  Vitamin D deficiency: - Vitamin D level is 78. - Continue vitamin D 50,000 units every other week.   Orders placed this encounter:  No orders of the defined types were placed in this encounter.    Derek Jack, MD [place] [phone]   I, Thana Ates, am acting as a scribe for Dr. Derek Jack.  I, Derek Jack MD, have reviewed the above documentation for accuracy and completeness, and I agree with the above.

## 2021-09-25 ENCOUNTER — Inpatient Hospital Stay (HOSPITAL_BASED_OUTPATIENT_CLINIC_OR_DEPARTMENT_OTHER): Payer: Medicare Other | Admitting: Hematology

## 2021-09-25 ENCOUNTER — Other Ambulatory Visit: Payer: Self-pay

## 2021-09-25 VITALS — BP 139/65 | HR 67 | Temp 96.9°F | Resp 17 | Wt 117.9 lb

## 2021-09-25 DIAGNOSIS — Z7982 Long term (current) use of aspirin: Secondary | ICD-10-CM | POA: Diagnosis not present

## 2021-09-25 DIAGNOSIS — E559 Vitamin D deficiency, unspecified: Secondary | ICD-10-CM

## 2021-09-25 DIAGNOSIS — D45 Polycythemia vera: Secondary | ICD-10-CM

## 2021-09-25 DIAGNOSIS — Z79899 Other long term (current) drug therapy: Secondary | ICD-10-CM | POA: Diagnosis not present

## 2021-09-25 NOTE — Patient Instructions (Signed)
[place] at [facility] Discharge Instructions  You were seen and examined today by Dr. Delton Coombes. Continue taking Hydrea and Vitamin D as prescribed. Please follow up as as scheduled.   Thank you for choosing [facility] to provide your oncology and hematology care.  To afford each patient quality time with our provider, please arrive at least 15 minutes before your scheduled appointment time.   If you have a lab appointment with the [facility] please come in thru the Main Entrance and check in at the main information desk.  You need to re-schedule your appointment should you arrive 10 or more minutes late.  We strive to give you quality time with our providers, and arriving late affects you and other patients whose appointments are after yours.  Also, if you no show three or more times for appointments you may be dismissed from the clinic at the providers discretion.     Again, thank you for choosing [facility].  Our hope is that these requests will decrease the amount of time that you wait before being seen by our physicians.       _____________________________________________________________  Should you have questions after your visit to [facility], please contact our office at [phone] and follow the prompts.  Our office hours are 8:00 a.m. and 4:30 p.m. Monday - Friday.  Please note that voicemails left after 4:00 p.m. may not be returned until the following business day.  We are closed weekends and major holidays.  You do have access to a nurse 24-7, just call the main number to the clinic [phone] and do not press any options, hold on the line and a nurse will answer the phone.    For prescription refill requests, have your pharmacy contact our office and allow 72 hours.    Due to Covid, you will need to wear a mask upon entering the hospital. If you do not have a mask, a mask will be  given to you at the Main Entrance upon arrival. For doctor visits, patients may have 1 support person age 20 or older with them. For treatment visits, patients can not have anyone with them due to social distancing guidelines and our immunocompromised population.

## 2021-11-12 ENCOUNTER — Other Ambulatory Visit (HOSPITAL_COMMUNITY): Payer: Self-pay | Admitting: Hematology

## 2021-11-12 DIAGNOSIS — D45 Polycythemia vera: Secondary | ICD-10-CM

## 2021-11-26 DIAGNOSIS — Z96642 Presence of left artificial hip joint: Secondary | ICD-10-CM | POA: Diagnosis not present

## 2022-01-14 ENCOUNTER — Ambulatory Visit (INDEPENDENT_AMBULATORY_CARE_PROVIDER_SITE_OTHER): Payer: Medicare Other | Admitting: Obstetrics & Gynecology

## 2022-01-14 ENCOUNTER — Other Ambulatory Visit: Payer: Self-pay

## 2022-01-14 ENCOUNTER — Encounter: Payer: Self-pay | Admitting: Obstetrics & Gynecology

## 2022-01-14 VITALS — BP 133/72 | HR 67 | Ht 64.0 in | Wt 118.0 lb

## 2022-01-14 DIAGNOSIS — Z4689 Encounter for fitting and adjustment of other specified devices: Secondary | ICD-10-CM | POA: Diagnosis not present

## 2022-01-14 DIAGNOSIS — N813 Complete uterovaginal prolapse: Secondary | ICD-10-CM | POA: Diagnosis not present

## 2022-01-14 NOTE — Progress Notes (Signed)
Chief Complaint  Patient presents with   Pessary Check    Blood pressure 133/72, pulse 67, height 5\' 4"  (1.626 m), weight 118 lb (53.5 kg).  [name] presents today for routine follow up related to her pessary.   She uses a Milex ring with support #2 She reports no vaginal discharge and no vaginal bleeding   Likert scale(1 not bothersome -5 very bothersome)  :  1  Exam reveals no undue vaginal mucosal pressure of breakdown, no discharge and no vaginal bleeding.  Vaginal Epithelial Abnormality Classification System:   0 0    No abnormalities 1    Epithelial erythema 2    Granulation tissue 3    Epithelial break or erosion, 1 cm or less 4    Epithelial break or erosion, 1 cm or greater  The pessary is removed, cleaned and replaced without difficulty.    No diagnosis found.   [name] will be sen back in 4 months for continued follow up.  Florian Buff, MD  01/14/2022 3:40 PM

## 2022-01-18 ENCOUNTER — Other Ambulatory Visit: Payer: Self-pay

## 2022-01-18 ENCOUNTER — Ambulatory Visit
Admission: EM | Admit: 2022-01-18 | Discharge: 2022-01-18 | Disposition: A | Discharge status: Home / Self Care | Payer: Medicare Other | Attending: Family Medicine | Admitting: Family Medicine

## 2022-01-18 DIAGNOSIS — L03012 Cellulitis of left finger: Secondary | ICD-10-CM | POA: Diagnosis not present

## 2022-01-18 MED ORDER — DOXYCYCLINE HYCLATE 100 MG PO CAPS: 100.0000 mg | ORAL_CAPSULE | Freq: Two times a day (BID) | ORAL | 0 refills | Status: DC

## 2022-01-18 NOTE — Discharge Instructions (Signed)
Follow-up in the next few days if your symptoms are not improving, sooner if worsening at anytime.

## 2022-01-18 NOTE — ED Triage Notes (Signed)
Pt reports pain, redness and swelling in left index finger x 3 days; "finger feels like have fever"

## 2022-01-18 NOTE — ED Provider Notes (Signed)
[facility]    CSN: [id number] Arrival date & time: 01/18/22  1023      History   Chief Complaint Chief Complaint  Patient presents with   finger problem    HPI [name] is a 86 y.o. female.   Presenting today with left distal index finger pain, swelling, redness x3 days.  Denies any injury to the area prior to onset of pain.  Denies fever, chills, drainage over the ribs, tingling, decreased range of motion.  So far not trying anything other than Tylenol for symptoms with minimal relief.   Past Medical History:  Diagnosis Date   Hypertension    Influenza 11/2012   Osteoporosis    Polycythemia    Polycythemia vera(238.4) 02/25/2013   Diagnosed August 2005 on Hydrea 500 mg 3 times a week with excellent control.    Prolapse, uterus, congenital    ring placement    Patient Active Problem List   Diagnosis Date Noted   Uterovaginal prolapse, complete 10/12/2013   Vaginitis and vulvovaginitis, unspecified 07/05/2013   Polycythemia vera ([place]) 02/25/2013    Past Surgical History:  Procedure Laterality Date   APPENDECTOMY     COLONOSCOPY     OOPHORECTOMY     left   opperectomy      OB History     Gravida  1   Para  1   Term      Preterm      AB      Living  1      SAB      IAB      Ectopic      Multiple      Live Births  1            Home Medications    Prior to Admission medications   Medication Sig Start Date End Date Taking? Authorizing Provider  doxycycline (VIBRAMYCIN) 100 MG capsule Take 1 capsule (100 mg total) by mouth 2 (two) times daily. 01/18/22  Yes Volney American, PA-C  amLODipine (NORVASC) 5 MG tablet Take 5 mg by mouth daily.  11/03/17   [provider]  aspirin 81 MG tablet Take 81 mg by mouth daily.    [provider]  diltiazem (CARDIZEM CD) 180 MG 24 hr capsule Take 180 mg by mouth daily.     [provider]  ergocalciferol (VITAMIN D2) 1.25 MG (50000 UT) capsule Take 1  capsule (50,000 Units total) by mouth once a week. Patient taking differently: Take 50,000 Units by mouth. Every other week 02/28/21   Derek Jack, MD  hydroxyurea (HYDREA) 500 MG capsule TAKE ONE CAPSULE BY MOUTH ON MONDAY, TUESDAY, WEDNESDAY, [place]. 11/12/21   Derek Jack, MD  mometasone (NASONEX) 50 MCG/ACT nasal spray 2 sprays. 11/23/20   [provider]    Family History Family History  Problem Relation Age of Onset   Cancer Brother        throat cancer   Cancer Brother        lung cancer   Cancer Brother        prostate cancer    Social History Social History   Tobacco Use   Smoking status: Never   Smokeless tobacco: Never  Vaping Use   Vaping Use: Never used  Substance Use Topics   Alcohol use: No   Drug use: No     Allergies   Codeine, Diovan [valsartan], and Penicillins   Review of Systems Review of  Systems Per HPI  Physical Exam Triage Vital Signs ED Triage Vitals  Enc Vitals Group     BP 01/18/22 1116 (!) 142/63     Pulse Rate 01/18/22 1116 77     Resp 01/18/22 1116 18     Temp 01/18/22 1116 98.7 F (37.1 C)     Temp Source 01/18/22 1116 Oral     SpO2 01/18/22 1116 96 %     Weight --      Height --      Head Circumference --      Peak Flow --      Pain Score 01/18/22 1117 8     Pain Loc --      Pain Edu? --      Excl. in [place]? --    No data found.  Updated Vital Signs BP (!) 142/63 (BP Location: Right Arm)    Pulse 77    Temp 98.7 F (37.1 C) (Oral)    Resp 18    SpO2 96%   Visual Acuity Right Eye Distance:   Left Eye Distance:   Bilateral Distance:    Right Eye Near:   Left Eye Near:    Bilateral Near:     Physical Exam Vitals and nursing note reviewed.  Constitutional:      Appearance: Normal appearance. She is not ill-appearing.  HENT:     Head: Atraumatic.  Eyes:     Extraocular Movements: Extraocular movements intact.     Conjunctiva/sclera: Conjunctivae normal.  Cardiovascular:      Rate and Rhythm: Normal rate and regular rhythm.     Heart sounds: Normal heart sounds.  Pulmonary:     Effort: Pulmonary effort is normal.     Breath sounds: Normal breath sounds.  Musculoskeletal:        General: Swelling and tenderness present. No signs of injury. Normal range of motion.     Cervical back: Normal range of motion and neck supple.     Comments: Left distal index finger diffusely erythematous, edematous.  Small paronychia present spread down lateral nail edge, nonfluctuant, minimal purulent buildup appreciable on exam  Skin:    General: Skin is warm and dry.     Findings: Erythema present. No bruising.  Neurological:     Mental Status: She is alert and oriented to person, place, and time.     Motor: No weakness.     Comments: Left hand neurovascularly intact  Psychiatric:        Mood and Affect: Mood normal.        Thought Content: Thought content normal.        Judgment: Judgment normal.     UC Treatments / Results  Labs (all labs ordered are listed, but only abnormal results are displayed) Labs Reviewed - No data to display  EKG   Radiology No results found.  Procedures Procedures (including critical care time)  Medications Ordered in UC Medications - No data to display  Initial Impression / Assessment and Plan / UC Course  I have reviewed the triage vital signs and the nursing notes.  Pertinent labs & imaging results that were available during my care of the patient were reviewed by me and considered in my medical decision making (see chart for details).     We will forego I&D today given widespread and minor nature of the purulent buildup at the nail edge at this time.  We will treat with antibiotics, warm Epsom salt soaks, hand elevation and close  follow-up.  Return for acutely worsening symptoms.  Final Clinical Impressions(s) / UC Diagnoses   Final diagnoses:  Paronychia of finger of left hand     Discharge Instructions       Follow-up in the next few days if your symptoms are not improving, sooner if worsening at anytime.    ED Prescriptions     Medication Sig Dispense Auth. Provider   doxycycline (VIBRAMYCIN) 100 MG capsule Take 1 capsule (100 mg total) by mouth 2 (two) times daily. 14 capsule Volney American, [place]      PDMP not reviewed this encounter.   Volney American, [place] 01/18/22 1225

## 2022-01-21 ENCOUNTER — Ambulatory Visit (HOSPITAL_BASED_OUTPATIENT_CLINIC_OR_DEPARTMENT_OTHER): Payer: Medicare Other | Admitting: Anesthesiology

## 2022-01-21 ENCOUNTER — Ambulatory Visit (HOSPITAL_BASED_OUTPATIENT_CLINIC_OR_DEPARTMENT_OTHER)
Admission: RE | Admit: 2022-01-21 | Discharge: 2022-01-21 | Disposition: A | Discharge status: Home / Self Care | Payer: Medicare Other | Source: Ambulatory Visit | Attending: Orthopedic Surgery | Admitting: Orthopedic Surgery

## 2022-01-21 ENCOUNTER — Other Ambulatory Visit: Payer: Self-pay

## 2022-01-21 ENCOUNTER — Other Ambulatory Visit: Payer: Self-pay | Admitting: Orthopedic Surgery

## 2022-01-21 ENCOUNTER — Encounter (HOSPITAL_BASED_OUTPATIENT_CLINIC_OR_DEPARTMENT_OTHER): Payer: Self-pay | Admitting: Orthopedic Surgery

## 2022-01-21 ENCOUNTER — Encounter (HOSPITAL_BASED_OUTPATIENT_CLINIC_OR_DEPARTMENT_OTHER)
Admission: RE | Disposition: A | Discharge status: Home / Self Care | Payer: Self-pay | Source: Ambulatory Visit | Attending: Orthopedic Surgery

## 2022-01-21 DIAGNOSIS — L02512 Cutaneous abscess of left hand: Secondary | ICD-10-CM | POA: Diagnosis present

## 2022-01-21 DIAGNOSIS — L03012 Cellulitis of left finger: Secondary | ICD-10-CM | POA: Diagnosis not present

## 2022-01-21 DIAGNOSIS — S61231A Puncture wound without foreign body of left index finger without damage to nail, initial encounter: Secondary | ICD-10-CM | POA: Diagnosis not present

## 2022-01-21 DIAGNOSIS — I10 Essential (primary) hypertension: Secondary | ICD-10-CM | POA: Diagnosis not present

## 2022-01-21 DIAGNOSIS — Z6820 Body mass index (BMI) 20.0-20.9, adult: Secondary | ICD-10-CM | POA: Diagnosis not present

## 2022-01-21 HISTORY — PX: INCISION AND DRAINAGE: SHX5863

## 2022-01-21 SURGERY — INCISION AND DRAINAGE
Anesthesia: Monitor Anesthesia Care | Site: Finger | Laterality: Left

## 2022-01-21 MED ORDER — ACETAMINOPHEN 500 MG PO TABS
ORAL_TABLET | ORAL | Status: AC
  Filled 2022-01-21: qty 2

## 2022-01-21 MED ORDER — LACTATED RINGERS IV SOLN: INTRAVENOUS | Status: DC

## 2022-01-21 MED ORDER — BUPIVACAINE HCL (PF) 0.25 % IJ SOLN
INTRAMUSCULAR | Status: AC
  Filled 2022-01-21: qty 30

## 2022-01-21 MED ORDER — TRAMADOL HCL 50 MG PO TABS: ORAL_TABLET | ORAL | 0 refills | Status: DC

## 2022-01-21 MED ORDER — BUPIVACAINE HCL (PF) 0.25 % IJ SOLN
INTRAMUSCULAR | Status: DC | PRN
  Administered 2022-01-21: 10 mL

## 2022-01-21 MED ORDER — PROMETHAZINE HCL 25 MG/ML IJ SOLN: 6.2500 mg | INTRAMUSCULAR | Status: DC | PRN

## 2022-01-21 MED ORDER — FENTANYL CITRATE (PF) 100 MCG/2ML IJ SOLN: 25.0000 ug | INTRAMUSCULAR | Status: DC | PRN

## 2022-01-21 MED ORDER — PROPOFOL 10 MG/ML IV BOLUS
INTRAVENOUS | Status: DC | PRN
  Administered 2022-01-21 (×2): 10 mg via INTRAVENOUS
  Administered 2022-01-21: 20 mg via INTRAVENOUS

## 2022-01-21 MED ORDER — ACETAMINOPHEN 500 MG PO TABS
1000.0000 mg | ORAL_TABLET | Freq: Once | ORAL | Status: AC
Start: 2022-01-21 — End: 2022-01-21
  Administered 2022-01-21: 1000 mg via ORAL

## 2022-01-21 MED ORDER — ONDANSETRON HCL 4 MG/2ML IJ SOLN
INTRAMUSCULAR | Status: AC
  Filled 2022-01-21: qty 2

## 2022-01-21 MED ORDER — AMISULPRIDE (ANTIEMETIC) 5 MG/2ML IV SOLN: 10.0000 mg | Freq: Once | INTRAVENOUS | Status: DC | PRN

## 2022-01-21 MED ORDER — CEFAZOLIN SODIUM-DEXTROSE 2-3 GM-%(50ML) IV SOLR
INTRAVENOUS | Status: DC | PRN
  Administered 2022-01-21: 2 g via INTRAVENOUS

## 2022-01-21 MED ORDER — 0.9 % SODIUM CHLORIDE (POUR BTL) OPTIME
TOPICAL | Status: DC | PRN
  Administered 2022-01-21: 100 mL

## 2022-01-21 MED ORDER — PROPOFOL 10 MG/ML IV BOLUS
INTRAVENOUS | Status: AC
  Filled 2022-01-21: qty 20

## 2022-01-21 MED ORDER — LACTATED RINGERS IV SOLN: INTRAVENOUS | Status: DC | PRN

## 2022-01-21 SURGICAL SUPPLY — 55 items
APL PRP STRL LF DISP 70% ISPRP (MISCELLANEOUS) ×1
BAG DECANTER FOR FLEXI CONT (MISCELLANEOUS) IMPLANT
BLADE MINI RND TIP GREEN BEAV (BLADE) IMPLANT
BLADE SURG 15 STRL LF DISP TIS (BLADE) ×4 IMPLANT
BLADE SURG 15 STRL SS (BLADE) ×4
BNDG CMPR 9X4 STRL LF SNTH (GAUZE/BANDAGES/DRESSINGS) ×1
BNDG COHESIVE 1X5 TAN STRL LF (GAUZE/BANDAGES/DRESSINGS) ×1 IMPLANT
BNDG ELASTIC 2X5.8 VLCR STR LF (GAUZE/BANDAGES/DRESSINGS) IMPLANT
BNDG ELASTIC 3X5.8 VLCR STR LF (GAUZE/BANDAGES/DRESSINGS) IMPLANT
BNDG ESMARK 4X9 LF (GAUZE/BANDAGES/DRESSINGS) ×1 IMPLANT
BNDG GAUZE 1X2.1 STRL (MISCELLANEOUS) IMPLANT
BNDG GAUZE ELAST 4 BULKY (GAUZE/BANDAGES/DRESSINGS) IMPLANT
CHLORAPREP W/TINT 26 (MISCELLANEOUS) ×3 IMPLANT
CORD BIPOLAR FORCEPS 12FT (ELECTRODE) ×3 IMPLANT
COVER BACK TABLE 60X90IN (DRAPES) ×3 IMPLANT
COVER MAYO STAND STRL (DRAPES) ×3 IMPLANT
CUFF TOURN SGL QUICK 18X4 (TOURNIQUET CUFF) ×2 IMPLANT
DRAPE EXTREMITY T 121X128X90 (DISPOSABLE) ×3 IMPLANT
DRAPE SURG 17X23 STRL (DRAPES) ×2 IMPLANT
DRAPE U-SHAPE 47X51 STRL (DRAPES) ×1 IMPLANT
GAUZE PACKING IODOFORM 1/4X15 (PACKING) ×1 IMPLANT
GAUZE SPONGE 4X4 12PLY STRL (GAUZE/BANDAGES/DRESSINGS) ×3 IMPLANT
GAUZE XEROFORM 1X8 LF (GAUZE/BANDAGES/DRESSINGS) ×3 IMPLANT
GLOVE SRG 8 PF TXTR STRL LF DI (GLOVE) ×2 IMPLANT
GLOVE SURG ENC MOIS LTX SZ7.5 (GLOVE) ×3 IMPLANT
GLOVE SURG UNDER POLY LF SZ7 (GLOVE) ×2 IMPLANT
GLOVE SURG UNDER POLY LF SZ8 (GLOVE) ×2
GOWN STRL REUS W/ TWL LRG LVL3 (GOWN DISPOSABLE) ×2 IMPLANT
GOWN STRL REUS W/TWL LRG LVL3 (GOWN DISPOSABLE) ×2
GOWN STRL REUS W/TWL XL LVL3 (GOWN DISPOSABLE) ×3 IMPLANT
LOOP VESSEL MAXI BLUE (MISCELLANEOUS) IMPLANT
NDL BLUNT 17GA (NEEDLE) IMPLANT
NDL HYPO 25X1 1.5 SAFETY (NEEDLE) IMPLANT
NEEDLE BLUNT 17GA (NEEDLE) IMPLANT
NEEDLE HYPO 25X1 1.5 SAFETY (NEEDLE) ×2 IMPLANT
NS IRRIG 1000ML POUR BTL (IV SOLUTION) ×3 IMPLANT
PACK BASIN DAY SURGERY FS (CUSTOM PROCEDURE TRAY) ×3 IMPLANT
PAD CAST 3X4 CTTN HI CHSV (CAST SUPPLIES) IMPLANT
PADDING CAST ABS 4INX4YD NS (CAST SUPPLIES)
PADDING CAST ABS COTTON 4X4 ST (CAST SUPPLIES) ×2 IMPLANT
PADDING CAST COTTON 3X4 STRL (CAST SUPPLIES)
SPLINT FINGER 3.25 BULB 911905 (SOFTGOODS) ×1 IMPLANT
SPLINT PLASTER CAST XFAST 3X15 (CAST SUPPLIES) IMPLANT
SPLINT PLASTER XTRA FASTSET 3X (CAST SUPPLIES)
STOCKINETTE 4X48 STRL (DRAPES) ×3 IMPLANT
SUT ETHILON 4 0 PS 2 18 (SUTURE) ×1 IMPLANT
SWAB COLLECTION DEVICE MRSA (MISCELLANEOUS) ×1 IMPLANT
SWAB CULTURE ESWAB REG 1ML (MISCELLANEOUS) ×1 IMPLANT
SYR 20ML LL LF (SYRINGE) IMPLANT
SYR BULB EAR ULCER 3OZ GRN STR (SYRINGE) ×3 IMPLANT
SYR CONTROL 10ML LL (SYRINGE) ×1 IMPLANT
SYR TOOMEY 50ML (SYRINGE) IMPLANT
TOWEL GREEN STERILE FF (TOWEL DISPOSABLE) ×6 IMPLANT
TUBE FEEDING ENTERAL 5FR 16IN (TUBING) IMPLANT
UNDERPAD 30X36 HEAVY ABSORB (UNDERPADS AND DIAPERS) ×3 IMPLANT

## 2022-01-21 NOTE — Anesthesia Preprocedure Evaluation (Addendum)
Anesthesia Evaluation  Patient identified by MRN, date of birth, ID band Patient awake    Reviewed: Allergy & Precautions, NPO status , Patient's Chart, lab work & pertinent test results  History of Anesthesia Complications Negative for: history of anesthetic complications  Airway Mallampati: I  TM Distance: >3 FB Neck ROM: Full    Dental  (+) Edentulous Upper, Dental Advisory Given   Pulmonary neg pulmonary ROS,    Pulmonary exam normal        Cardiovascular hypertension, Pt. on medications Normal cardiovascular exam     Neuro/Psych negative neurological ROS     GI/Hepatic negative GI ROS, Neg liver ROS,   Endo/Other  negative endocrine ROS  Renal/GU negative Renal ROS     Musculoskeletal negative musculoskeletal ROS (+)   Abdominal   Peds  Hematology negative hematology ROS (+)   Anesthesia Other Findings   Reproductive/Obstetrics                            Anesthesia Physical Anesthesia Plan  ASA: 2  Anesthesia Plan: MAC   Post-op Pain Management: Tylenol PO (pre-op)   Induction:   PONV Risk Score and Plan: 2 and Ondansetron and Dexamethasone  Airway Management Planned: Natural Airway and Simple Face Mask  Additional Equipment:   Intra-op Plan:   Post-operative Plan:   Informed Consent: I have reviewed the patients History and Physical, chart, labs and discussed the procedure including the risks, benefits and alternatives for the proposed anesthesia with the patient or authorized representative who has indicated his/her understanding and acceptance.     Dental advisory given  Plan Discussed with: Anesthesiologist and CRNA  Anesthesia Plan Comments:        Anesthesia Quick Evaluation

## 2022-01-21 NOTE — Anesthesia Procedure Notes (Signed)
Procedure Name: MAC Date/Time: 01/21/2022 2:47 PM Performed by: Signe Colt, CRNA Pre-anesthesia Checklist: Patient identified, Emergency Drugs available, Suction available, Patient being monitored and Timeout performed Patient Re-evaluated:Patient Re-evaluated prior to induction Oxygen Delivery Method: Simple face mask

## 2022-01-21 NOTE — Op Note (Signed)
NAME: Idamay RECORD NO: [id number] DATE OF BIRTH: 1936-07-19 FACILITY: [facility] LOCATION: [facility] PHYSICIAN: Tennis Must, MD   OPERATIVE REPORT   DATE OF PROCEDURE: 01/21/22    PREOPERATIVE DIAGNOSIS: Left index finger abscess   POSTOPERATIVE DIAGNOSIS: Left index finger felon/paronychia   PROCEDURE: Incision and drainage left index finger felon/paronychia   SURGEON:  Leanora Cover, M.D.   ASSISTANT: none   ANESTHESIA:  Local with sedation   INTRAVENOUS FLUIDS:  Per anesthesia flow sheet.   ESTIMATED BLOOD LOSS:  Minimal.   COMPLICATIONS:  None.   SPECIMENS: Cultures to micro   TOURNIQUET TIME:    Total Tourniquet Time Documented: Forearm (Left) - 13 minutes Total: Forearm (Left) - 13 minutes    DISPOSITION:  Stable to PACU.   INDICATIONS: 86 year old female states she has had increasing swelling and pain of the left index finger distal phalanx over the past week.  She was started on antibiotics by urgent care 3 days ago.  She has had increasing pain and swelling despite the antibiotics.  She wishes to proceed with incision and drainage of the finger.  Risks, benefits and alternatives of surgery were discussed including the risks of blood loss, infection, damage to nerves, vessels, tendons, ligaments, bone for surgery, need for additional surgery, complications with wound healing, continued pain, stiffness, , need for repeat irrigation and debridement.  She voiced understanding of these risks and elected to proceed.  OPERATIVE COURSE:  After being identified preoperatively by myself,  the patient and I agreed on the procedure and site of the procedure.  The surgical site was marked.  Surgical consent had been signed. She was given IV antibiotics as preoperative antibiotic prophylaxis. She was transferred to the operating room and placed on the operating table in supine position with the Left upper extremity on an arm board.  Sedation was  induced by the anesthesiologist.a surgical pause was performed between the surgeons anesthesia and operating room staff and all were in agreement as to the patient procedure and site procedure.  A digital block was performed with quarter percent plain Marcaine to aid in postoperative analgesia.  Left upper extremity was prepped and draped in normal sterile orthopedic fashion.  A surgical pause was performed between the surgeons, anesthesia, and operating room staff and all were in agreement as to the patient, procedure, and site of procedure.  Tourniquet at the proximal aspect of the forearm was inflated to 250 mmHg after exsanguination of the arm with an Esmarch bandage.  The skin was opened with the pickups.  There was gross purulence.  Cultures were taken for aerobes and anaerobes.  The dried epidermis was sharply removed with the scissors.  The subcutaneous tissues were raw.  There was purulence coming from the pad of the finger.  The knife was used to extend the wound.  The scissors used to open all the septae across the volar aspect of the pulp of the digit.  The nail was removed with a Soil scientist.  The abscess underneath the skin coursed around to the dorsum of the dorsal nail fold.  All dried epidermis was sharply removed with the scissors.  The wound was copiously irrigated with sterile saline.  The finger was milked and no purulence was coming from proximally.  The wound was packed with quarter inch iodoform gauze.  A piece of Xeroform was placed in the nail fold and the wounds dressed with sterile Xeroform while on area for drainage.  This  was then dressed with sterile 4 x 4 dressing wrapped with a Coban dressing lightly.  An AlumaFoam splint was placed and wrapped lightly with Coban dressing.  The tourniquet was deflated at 13 minutes.  Fingertips were pink with brisk capillary refill after deflation of tourniquet.  The operative  drapes were broken down.  The patient was awoken from anesthesia  safely.  She was transferred back to the stretcher and taken to PACU in stable condition.  I will see her back in the office in 3-4 days for postoperative followup.  I will give her a prescription for Tramadol 50 mg 1 tab PO q6 hours prn pain, dispense #15.   Leanora Cover, MD Electronically signed, 01/21/22

## 2022-01-21 NOTE — Anesthesia Postprocedure Evaluation (Signed)
Anesthesia Post Note  Patient: [name]  Procedure(s) Performed: INCISION AND DRAINAGE LEFT INDEX FINGER (Left: Finger)     Patient location during evaluation: PACU Anesthesia Type: MAC Level of consciousness: awake and alert Pain management: pain level controlled Vital Signs Assessment: post-procedure vital signs reviewed and stable Respiratory status: spontaneous breathing and respiratory function stable Cardiovascular status: stable Postop Assessment: no apparent nausea or vomiting Anesthetic complications: no   No notable events documented.  Last Vitals:  Vitals:   01/21/22 1515 01/21/22 1535  BP: (!) 143/73 (!) 160/78  Pulse: 74 60  Resp: 20 18  Temp:  (!) 36.2 C  SpO2: 95% 96%    Last Pain:  Vitals:   01/21/22 1535  TempSrc:   PainSc: 0-No pain                 [name]

## 2022-01-21 NOTE — H&P (Signed)
°  [name] is an 86 y.o. female.   Chief Complaint: finger infection HPI: 86 yo female with swelling, erythema, pain left index finger over the past week.  This has gotten worse over that time.  Started on antibiotics 2 days ago.  No fevers, chills, sweats.  Allergies:  Allergies  Allergen Reactions   Codeine     Nausea and vomiting   Diovan [Valsartan]     Rash and itching    Penicillins     Rash and "breaking out in whelps"     Past Medical History:  Diagnosis Date   Hypertension    Influenza 11/2012   Osteoporosis    Polycythemia    Polycythemia vera(238.4) 02/25/2013   Diagnosed August 2005 on Hydrea 500 mg 3 times a week with excellent control.    Prolapse, uterus, congenital    ring placement    Past Surgical History:  Procedure Laterality Date   APPENDECTOMY     COLONOSCOPY     OOPHORECTOMY     left   opperectomy      Family History: Family History  Problem Relation Age of Onset   Cancer Brother        throat cancer   Cancer Brother        lung cancer   Cancer Brother        prostate cancer    Social History:   reports that she has never smoked. She has never used smokeless tobacco. She reports that she does not drink alcohol and does not use drugs.  Medications: Medications Prior to Admission  Medication Sig Dispense Refill   amLODipine (NORVASC) 5 MG tablet Take 5 mg by mouth daily.      aspirin 81 MG tablet Take 81 mg by mouth daily.     diltiazem (CARDIZEM CD) 180 MG 24 hr capsule Take 180 mg by mouth daily.      doxycycline (VIBRAMYCIN) 100 MG capsule Take 1 capsule (100 mg total) by mouth 2 (two) times daily. 14 capsule 0   ergocalciferol (VITAMIN D2) 1.25 MG (50000 UT) capsule Take 1 capsule (50,000 Units total) by mouth once a week. (Patient taking differently: Take 50,000 Units by mouth. Every other week) 16 capsule 4   hydroxyurea (HYDREA) 500 MG capsule TAKE ONE CAPSULE BY MOUTH ON MONDAY, TUESDAY, WEDNESDAY, THURSDAY & FRIDAY ONLY.  64 capsule 6   mometasone (NASONEX) 50 MCG/ACT nasal spray 2 sprays.      No results found for this or any previous visit (from the past 48 hour(s)).  No results found.    There were no vitals taken for this visit.  General appearance: alert, cooperative, and appears stated age Head: Normocephalic, without obvious abnormality, atraumatic Neck: supple, symmetrical, trachea midline Extremities: Intact sensation and capillary refill all digits.  +epl/fpl/io.  Swelling and tenderness distal phalanx left index finger.  Purulence visible under skin. Pulses: 2+ and symmetric Skin: Skin color, texture, turgor normal. No rashes or lesions Neurologic: Grossly normal Incision/Wound: none  Assessment/Plan Left index finger felon/paronychia.  Plan incision and drainage in OR.  Non operative and operative treatment options have been discussed with the patient and patient wishes to proceed with operative treatment. Risks, benefits, and alternatives of surgery have been discussed and the patient agrees with the plan of care.   Leanora Cover 01/21/2022, 1:14 PM

## 2022-01-21 NOTE — Transfer of Care (Signed)
Immediate Anesthesia Transfer of Care Note  Patient: [name]  Procedure(s) Performed: INCISION AND DRAINAGE LEFT INDEX FINGER (Left: Finger)  Patient Location: PACU  Anesthesia Type:MAC  Level of Consciousness: awake, alert , oriented and patient cooperative  Airway & Oxygen Therapy: Patient Spontanous Breathing and Patient connected to face mask oxygen  Post-op Assessment: Report given to RN and Post -op Vital signs reviewed and stable  Post vital signs: Reviewed and stable  Last Vitals:  Vitals Value Taken Time  BP    Temp    Pulse 64 01/21/22 1503  Resp 19 01/21/22 1503  SpO2 98 % 01/21/22 1503  Vitals shown include unvalidated device data.  Last Pain:  Vitals:   01/21/22 1334  TempSrc: Oral  PainSc: 7       Patients Stated Pain Goal: 5 (07/29/87 7195)  Complications: No notable events documented.

## 2022-01-21 NOTE — Discharge Instructions (Addendum)
You had 1000 mg of Tylenol at [facility] Instructions Hand Surgery  Wound Care: Keep your hand elevated above the level of your heart.  Do not allow it to dangle by your side.  Keep the dressing dry and do not remove it unless your doctor advises you to do so.  He will usually change it at the time of your post-op visit.  Moving your fingers is advised to stimulate circulation but will depend on the site of your surgery.  If you have a splint applied, your doctor will advise you regarding movement.  Activity: Do not drive or operate machinery today.  Rest today and then you may return to your normal activity and work as indicated by your physician.  Diet:  Drink liquids today or eat a light diet.  You may resume a regular diet tomorrow.    General expectations: Pain for two to three days. Fingers may become slightly swollen.  Call your doctor if any of the following occur: Severe pain not relieved by pain medication. Elevated temperature. Dressing soaked with blood. Inability to move fingers. White or bluish color to fingers.   Post Anesthesia Home Care Instructions  Activity: Get plenty of rest for the remainder of the day. A responsible individual must stay with you for 24 hours following the procedure.  For the next 24 hours, DO NOT: -Drive a car -Paediatric nurse -Drink alcoholic beverages -Take any medication unless instructed by your physician -Make any legal decisions or sign important papers.  Meals: Start with liquid foods such as gelatin or soup. Progress to regular foods as tolerated. Avoid greasy, spicy, heavy foods. If nausea and/or vomiting occur, drink only clear liquids until the nausea and/or vomiting subsides. Call your physician if vomiting continues.  Special Instructions/Symptoms: Your throat may feel dry or sore from the anesthesia or the breathing tube placed in your throat during surgery. If this causes discomfort, gargle with warm salt water.  The discomfort should disappear within 24 hours.  If you had a scopolamine patch placed behind your ear for the management of post- operative nausea and/or vomiting:  1. The medication in the patch is effective for 72 hours, after which it should be removed.  Wrap patch in a tissue and discard in the trash. Wash hands thoroughly with soap and water. 2. You may remove the patch earlier than 72 hours if you experience unpleasant side effects which may include dry mouth, dizziness or visual disturbances. 3. Avoid touching the patch. Wash your hands with soap and water after contact with the patch.

## 2022-01-22 ENCOUNTER — Encounter (HOSPITAL_BASED_OUTPATIENT_CLINIC_OR_DEPARTMENT_OTHER): Payer: Self-pay | Admitting: Orthopedic Surgery

## 2022-01-23 DIAGNOSIS — R52 Pain, unspecified: Secondary | ICD-10-CM | POA: Diagnosis not present

## 2022-01-23 DIAGNOSIS — M25642 Stiffness of left hand, not elsewhere classified: Secondary | ICD-10-CM | POA: Diagnosis not present

## 2022-01-23 DIAGNOSIS — L03012 Cellulitis of left finger: Secondary | ICD-10-CM | POA: Diagnosis not present

## 2022-01-23 DIAGNOSIS — L02512 Cutaneous abscess of left hand: Secondary | ICD-10-CM | POA: Diagnosis not present

## 2022-01-25 DIAGNOSIS — L02512 Cutaneous abscess of left hand: Secondary | ICD-10-CM | POA: Diagnosis not present

## 2022-01-25 DIAGNOSIS — M25642 Stiffness of left hand, not elsewhere classified: Secondary | ICD-10-CM | POA: Diagnosis not present

## 2022-01-25 DIAGNOSIS — L03012 Cellulitis of left finger: Secondary | ICD-10-CM | POA: Diagnosis not present

## 2022-01-25 DIAGNOSIS — T148XXA Other injury of unspecified body region, initial encounter: Secondary | ICD-10-CM | POA: Diagnosis not present

## 2022-01-25 DIAGNOSIS — R52 Pain, unspecified: Secondary | ICD-10-CM | POA: Diagnosis not present

## 2022-01-25 LAB — ACID FAST SMEAR (AFB, MYCOBACTERIA): Acid Fast Smear: NEGATIVE

## 2022-01-26 LAB — AEROBIC/ANAEROBIC CULTURE W GRAM STAIN (SURGICAL/DEEP WOUND)

## 2022-01-28 DIAGNOSIS — R52 Pain, unspecified: Secondary | ICD-10-CM | POA: Diagnosis not present

## 2022-01-28 DIAGNOSIS — L02512 Cutaneous abscess of left hand: Secondary | ICD-10-CM | POA: Diagnosis not present

## 2022-01-28 DIAGNOSIS — M25642 Stiffness of left hand, not elsewhere classified: Secondary | ICD-10-CM | POA: Diagnosis not present

## 2022-01-28 DIAGNOSIS — T148XXA Other injury of unspecified body region, initial encounter: Secondary | ICD-10-CM | POA: Diagnosis not present

## 2022-01-28 DIAGNOSIS — L03012 Cellulitis of left finger: Secondary | ICD-10-CM | POA: Diagnosis not present

## 2022-01-31 DIAGNOSIS — M25642 Stiffness of left hand, not elsewhere classified: Secondary | ICD-10-CM | POA: Diagnosis not present

## 2022-01-31 DIAGNOSIS — R52 Pain, unspecified: Secondary | ICD-10-CM | POA: Diagnosis not present

## 2022-01-31 DIAGNOSIS — L02512 Cutaneous abscess of left hand: Secondary | ICD-10-CM | POA: Diagnosis not present

## 2022-01-31 DIAGNOSIS — L03012 Cellulitis of left finger: Secondary | ICD-10-CM | POA: Diagnosis not present

## 2022-02-12 DIAGNOSIS — I10 Essential (primary) hypertension: Secondary | ICD-10-CM | POA: Diagnosis not present

## 2022-02-12 DIAGNOSIS — M810 Age-related osteoporosis without current pathological fracture: Secondary | ICD-10-CM | POA: Diagnosis not present

## 2022-02-12 DIAGNOSIS — M1991 Primary osteoarthritis, unspecified site: Secondary | ICD-10-CM | POA: Diagnosis not present

## 2022-02-19 LAB — FUNGUS CULTURE WITH STAIN

## 2022-02-19 LAB — FUNGAL ORGANISM REFLEX

## 2022-02-19 LAB — FUNGUS CULTURE RESULT

## 2022-02-20 ENCOUNTER — Encounter: Payer: Self-pay | Admitting: Internal Medicine

## 2022-02-20 ENCOUNTER — Ambulatory Visit (INDEPENDENT_AMBULATORY_CARE_PROVIDER_SITE_OTHER): Payer: Medicare Other | Admitting: Internal Medicine

## 2022-02-20 ENCOUNTER — Other Ambulatory Visit: Payer: Self-pay

## 2022-02-20 VITALS — BP 155/58 | HR 73 | Temp 98.2°F | Ht 64.0 in | Wt 117.0 lb

## 2022-02-20 DIAGNOSIS — L0390 Cellulitis, unspecified: Secondary | ICD-10-CM

## 2022-02-20 MED ORDER — CEFPODOXIME PROXETIL 200 MG PO TABS: 200.0000 mg | ORAL_TABLET | Freq: Two times a day (BID) | ORAL | 0 refills | Status: AC

## 2022-02-20 NOTE — Progress Notes (Signed)
?  ? ? ? ? ?[facility] ? ?Reason for Consult:felon ?Referring Provider: Leanora Cover MD ? ? ? ?Patient Active Problem List  ? Diagnosis Date Noted  ? Uterovaginal prolapse, complete 10/12/2013  ? Vaginitis and vulvovaginitis, unspecified 07/05/2013  ? Polycythemia vera ([place]) 02/25/2013  ? ? ? ? ?HPI: [name] is a 86 y.o. female with chart hx polycythemia vera referred here for left index finger felon ? ?Reviewed referral and previous notes on epic ? ?She had s/s of felon/paronychia a week prior to 01/21/22. She was admitted by orthopedics on 01/21/22 for I&D due to progression despite antibiotic. No fever chill noted previously ? ?She was seen in ed 3 days prior to that and given doxycycline ? ?S/p I&D 2/06. I reviewed op-note. Received periop cefazolin ?Left index finger paronychia. Gross purulence found. Cultures taken. I do not see any dissection to the bone mentioned.  ? ?Culture (fungal cx) saw fungal element but nothing is growing. Bacterial show strep intermedius. afb cultures are negative ? ?Given bactrim after operation and finished it ? ? ?She takes hydrea for her polycythemia vera ? ?She said the finger is improving. Incision healed. Redness improving. No pain now. Saw ortho recently who is happy with it ? ? ? ?Review of Systems: ?ROS ?All other ros negative ? ? ?Allergy:  ?Pcn during age 21 hives no anaphylaxis. Not tried other pcn products ? ? ? ? ? ?Past Medical History:  ?Diagnosis Date  ? Hypertension   ? Influenza 11/2012  ? Osteoporosis   ? Polycythemia   ? Polycythemia vera(238.4) 02/25/2013  ? Diagnosed August 2005 on Hydrea 500 mg 3 times a week with excellent control.   ? Prolapse, uterus, congenital   ? ring placement  ? ? ?Social History  ? ?Tobacco Use  ? Smoking status: Never  ? Smokeless tobacco: Never  ?Vaping Use  ? Vaping Use: Never used  ?Substance Use Topics  ? Alcohol use: No  ? Drug use: No  ? ? ?Family History  ?Problem Relation Age of Onset  ?  Cancer Brother   ?     throat cancer  ? Cancer Brother   ?     lung cancer  ? Cancer Brother   ?     prostate cancer  ? ? ?Allergies  ?Allergen Reactions  ? Codeine   ?  Nausea and vomiting  ? Diovan [Valsartan]   ?  Rash and itching ?  ? Penicillins   ?  Rash and "breaking out in whelps" ?  ? ? ?OBJECTIVE: ?Vitals:  ? 02/20/22 1456  ?BP: (!) 155/58  ?Pulse: 73  ?Temp: 98.2 ?F (36.8 ?C)  ?TempSrc: Oral  ?SpO2: 98%  ?Weight: 117 lb (53.1 kg)  ?Height: '5\' 4"'$  (1.626 m)  ? ?Body mass index is 20.08 kg/m?. ? ? ?Physical Exam ?General/constitutional: no distress, pleasant ?HEENT: Normocephalic, PER, Conj Clear, EOMI, Oropharynx clear ?Neck supple ?CV: rrr no mrg ?Lungs: clear to auscultation, normal respiratory effort ?Abd: Soft, Nontender ?Ext: no edema ?Skin: No Rash ?Neuro: nonfocal ?MSK: no peripheral joint swelling/tenderness/warmth; back spines nontender ? ? ? ? ? ?Lab: ?Lab Results  ?Component Value Date  ? WBC 5.0 09/18/2021  ? HGB 12.2 09/18/2021  ? HCT 37.2 09/18/2021  ? MCV 110.4 (H) 09/18/2021  ? PLT 292 09/18/2021  ? ?Last metabolic panel ?Lab Results  ?Component Value Date  ? GLUCOSE 96 09/18/2021  ? NA 137 09/18/2021  ? K 3.7 09/18/2021  ?  CL 103 09/18/2021  ? CO2 27 09/18/2021  ? BUN 15 09/18/2021  ? CREATININE 0.63 09/18/2021  ? GFRNONAA >60 09/18/2021  ? CALCIUM 8.5 (L) 09/18/2021  ? PROT 7.2 09/18/2021  ? ALBUMIN 4.0 09/18/2021  ? BILITOT 0.9 09/18/2021  ? ALKPHOS 85 09/18/2021  ? AST 18 09/18/2021  ? ALT 13 09/18/2021  ? ANIONGAP 7 09/18/2021  ? ? ?Microbiology: ? ?Serology: ? ?Imaging: ? ? ?Assessment/plan: ?Problem List Items Addressed This Visit   ?None ?Visit Diagnoses   ? ? Cellulitis, unspecified cellulitis site    -  Primary  ? ?  ? ? ?Paronychia s/p or debridement 2/06. Fungal element likely contaminant ?Received abx which does not cover strep ? ?Some cellulitis still on exam but likely just inflammation due to skin healing ? ?Echelon with 7 more days of cefpodoxime should be sufficient. Parq hold  for pcn allergy/cross reactivity. Tolerated cefazolin, but also suspect low risk with cefpodoxime ? ?F/u if allergic reaction or no improvement ? ?F/u ortho as discussed with them ? ?I have spent a total of 65 minutes of face-to-face and non-face-to-face time, excluding clinical staff time, preparing to see patient, ordering tests and/or medications, and provide counseling the patient  ? ? ? ? ? ?Follow-up: No follow-ups on file. ? ?Jabier Mutton, MD ?[facility] ?[place] ?[phone] pager   [phone] cell ?02/20/2022, 3:04 PM ? ?

## 2022-02-20 NOTE — Patient Instructions (Signed)
Your infection is well taken care off since surgery ?There is some redness/cellulitis still and we can try 7 days of an antibiotics that cover for skin bug including the streptococcus that was on culture ? ?You tolerate another cephalosporin ok during surgery so should be as well with this new cephalosporin (cefpodoxime). The chance for cross reactivity with penicillin is very low ? ?If any rash/dyspnea call us immediately (this should happen if it does within first few hours of taking this medication) ? ? ?The fungal element seen (not growing on culture) is likely a contaminant. We do not see this fungal infection in your situation. ? ?If no improvement (worsening redness/pain/swelling) please call our clinic ? ?No need to schedule follow up at this time ? ? ?Thank you ?

## 2022-03-10 LAB — ACID FAST CULTURE WITH REFLEXED SENSITIVITIES (MYCOBACTERIA): Acid Fast Culture: NEGATIVE

## 2022-04-02 ENCOUNTER — Inpatient Hospital Stay (HOSPITAL_COMMUNITY): Payer: Medicare Other | Attending: Hematology

## 2022-04-02 DIAGNOSIS — Z7982 Long term (current) use of aspirin: Secondary | ICD-10-CM | POA: Insufficient documentation

## 2022-04-02 DIAGNOSIS — E559 Vitamin D deficiency, unspecified: Secondary | ICD-10-CM | POA: Diagnosis not present

## 2022-04-02 DIAGNOSIS — Z801 Family history of malignant neoplasm of trachea, bronchus and lung: Secondary | ICD-10-CM | POA: Diagnosis not present

## 2022-04-02 DIAGNOSIS — Z79899 Other long term (current) drug therapy: Secondary | ICD-10-CM | POA: Diagnosis not present

## 2022-04-02 DIAGNOSIS — D45 Polycythemia vera: Secondary | ICD-10-CM | POA: Diagnosis not present

## 2022-04-02 LAB — CBC WITH DIFFERENTIAL/PLATELET
Abs Immature Granulocytes: 0 10*3/uL (ref 0.00–0.07)
Basophils Absolute: 0.1 10*3/uL (ref 0.0–0.1)
Basophils Relative: 1 %
Eosinophils Absolute: 0.1 10*3/uL (ref 0.0–0.5)
Eosinophils Relative: 2 %
HCT: 34.7 % — ABNORMAL LOW (ref 36.0–46.0)
Hemoglobin: 11.3 g/dL — ABNORMAL LOW (ref 12.0–15.0)
Immature Granulocytes: 0 %
Lymphocytes Relative: 24 %
Lymphs Abs: 1.2 10*3/uL (ref 0.7–4.0)
MCH: 35.2 pg — ABNORMAL HIGH (ref 26.0–34.0)
MCHC: 32.6 g/dL (ref 30.0–36.0)
MCV: 108.1 fL — ABNORMAL HIGH (ref 80.0–100.0)
Monocytes Absolute: 0.5 10*3/uL (ref 0.1–1.0)
Monocytes Relative: 10 %
Neutro Abs: 3.2 10*3/uL (ref 1.7–7.7)
Neutrophils Relative %: 63 %
Platelets: 258 10*3/uL (ref 150–400)
RBC: 3.21 MIL/uL — ABNORMAL LOW (ref 3.87–5.11)
RDW: 14.6 % (ref 11.5–15.5)
WBC: 5 10*3/uL (ref 4.0–10.5)
nRBC: 0 % (ref 0.0–0.2)

## 2022-04-02 LAB — VITAMIN B12: Vitamin B-12: 241 pg/mL (ref 180–914)

## 2022-04-02 LAB — COMPREHENSIVE METABOLIC PANEL
ALT: 13 U/L (ref 0–44)
AST: <5 U/L — ABNORMAL LOW (ref 15–41)
Albumin: 3.8 g/dL (ref 3.5–5.0)
Alkaline Phosphatase: 73 U/L (ref 38–126)
Anion gap: 6 (ref 5–15)
BUN: 24 mg/dL — ABNORMAL HIGH (ref 8–23)
CO2: 26 mmol/L (ref 22–32)
Calcium: 8.4 mg/dL — ABNORMAL LOW (ref 8.9–10.3)
Chloride: 104 mmol/L (ref 98–111)
Creatinine, Ser: 0.77 mg/dL (ref 0.44–1.00)
GFR, Estimated: >60 mL/min (ref >60)
Glucose, Bld: 103 mg/dL — ABNORMAL HIGH (ref 70–99)
Potassium: 3.9 mmol/L (ref 3.5–5.1)
Sodium: 136 mmol/L (ref 135–145)
Total Bilirubin: 0.6 mg/dL (ref 0.3–1.2)
Total Protein: 6.7 g/dL (ref 6.5–8.1)

## 2022-04-02 LAB — IRON AND TIBC
Iron: 85 ug/dL (ref 28–170)
Saturation Ratios: 29 % (ref 10.4–31.8)
TIBC: 296 ug/dL (ref 250–450)
UIBC: 211 ug/dL

## 2022-04-02 LAB — VITAMIN D 25 HYDROXY (VIT D DEFICIENCY, FRACTURES): Vit D, 25-Hydroxy: 78.94 ng/mL (ref 30–100)

## 2022-04-02 LAB — LACTATE DEHYDROGENASE: LDH: 127 U/L (ref 98–192)

## 2022-04-02 LAB — FERRITIN: Ferritin: 64 ng/mL (ref 11–307)

## 2022-04-09 ENCOUNTER — Inpatient Hospital Stay (HOSPITAL_BASED_OUTPATIENT_CLINIC_OR_DEPARTMENT_OTHER): Payer: Medicare Other | Admitting: Hematology

## 2022-04-09 VITALS — BP 139/69 | HR 60 | Temp 97.8°F | Resp 17 | Ht 64.0 in | Wt 116.5 lb

## 2022-04-09 DIAGNOSIS — D45 Polycythemia vera: Secondary | ICD-10-CM

## 2022-04-09 DIAGNOSIS — Z7982 Long term (current) use of aspirin: Secondary | ICD-10-CM | POA: Diagnosis not present

## 2022-04-09 DIAGNOSIS — Z801 Family history of malignant neoplasm of trachea, bronchus and lung: Secondary | ICD-10-CM | POA: Diagnosis not present

## 2022-04-09 DIAGNOSIS — Z79899 Other long term (current) drug therapy: Secondary | ICD-10-CM | POA: Diagnosis not present

## 2022-04-09 DIAGNOSIS — E559 Vitamin D deficiency, unspecified: Secondary | ICD-10-CM | POA: Diagnosis not present

## 2022-04-09 NOTE — Progress Notes (Signed)
? ?[facility] ?[address] ?[place], [place] [address] ? ? ?CLINIC:  ?Medical Oncology/Hematology ? ?PCP:  ?[name], MD ?[address] / [place] [address]  ?[phone] ? ?REASON FOR VISIT:  ?Follow-up for JAK2 positive polycythemia vera ? ?PRIOR THERAPY: none ? ?CURRENT THERAPY: Hydrea ? ?INTERVAL HISTORY:  ?Ms. [name], a 86 y.o. female, returns for routine follow-up for her JAK2 positive polycythemia vera. Lyniah was last seen on 09/25/2021. ? ?Today he reports feeling good. She has been taking Hydrea and tolerating it well. She is taking 81 mg Asprin. She denies itching after showers and changing colors of fingers. She denies recent infections and sores on legs. Her appetite is good.  ? ?REVIEW OF SYSTEMS:  ?Review of Systems  ?Constitutional:  Negative for appetite change and fatigue.  ?Skin:  Negative for itching.  ?All other systems reviewed and are negative. ? ?PAST MEDICAL/SURGICAL HISTORY:  ?Past Medical History:  ?Diagnosis Date  ? Hypertension   ? Influenza 11/2012  ? Osteoporosis   ? Polycythemia   ? Polycythemia vera(238.4) 02/25/2013  ? Diagnosed August 2005 on Hydrea 500 mg 3 times a week with excellent control.   ? Prolapse, uterus, congenital   ? ring placement  ? ?Past Surgical History:  ?Procedure Laterality Date  ? APPENDECTOMY    ? COLONOSCOPY    ? INCISION AND DRAINAGE Left 01/21/2022  ? Procedure: INCISION AND DRAINAGE LEFT INDEX FINGER;  Surgeon: Leanora Cover, MD;  Location: [facility];  Service: Orthopedics;  Laterality: Left;  ? OOPHORECTOMY    ? left  ? opperectomy    ? ? ?SOCIAL HISTORY:  ?Social History  ? ?Socioeconomic History  ? Marital status: Divorced  ?  Spouse name: Not on file  ? Number of children: 1  ? Years of education: Not on file  ? Highest education level: Not on file  ?Occupational History  ? Not on file  ?Tobacco Use  ? Smoking status: Never  ? Smokeless tobacco: Never  ?Vaping Use  ? Vaping Use: Never used  ?Substance  and Sexual Activity  ? Alcohol use: No  ? Drug use: No  ? Sexual activity: Yes  ?  Birth control/protection: Post-menopausal  ?Other Topics Concern  ? Not on file  ?Social History Narrative  ? Not on file  ? ?Social Determinants of Health  ? ?Financial Resource Strain: Not on file  ?Food Insecurity: Not on file  ?Transportation Needs: Not on file  ?Physical Activity: Not on file  ?Stress: Not on file  ?Social Connections: Not on file  ?Intimate Partner Violence: Not on file  ? ? ?FAMILY HISTORY:  ?Family History  ?Problem Relation Age of Onset  ? Cancer Brother   ?     throat cancer  ? Cancer Brother   ?     lung cancer  ? Cancer Brother   ?     prostate cancer  ? ? ?CURRENT MEDICATIONS:  ?Current Outpatient Medications  ?Medication Sig Dispense Refill  ? amLODipine (NORVASC) 5 MG tablet Take 5 mg by mouth daily.     ? aspirin 81 MG tablet Take 81 mg by mouth daily.    ? diltiazem (CARDIZEM CD) 180 MG 24 hr capsule Take 180 mg by mouth daily.     ? doxycycline (VIBRAMYCIN) 100 MG capsule Take 1 capsule (100 mg total) by mouth 2 (two) times daily. (Patient not taking: Reported on 02/20/2022) 14 capsule 0  ? ergocalciferol (VITAMIN D2)  1.25 MG (50000 UT) capsule Take 1 capsule (50,000 Units total) by mouth once a week. (Patient taking differently: Take 50,000 Units by mouth. Every other week) 16 capsule 4  ? hydroxyurea (HYDREA) 500 MG capsule TAKE ONE CAPSULE BY MOUTH ON MONDAY, TUESDAY, WEDNESDAY, THURSDAY & FRIDAY ONLY. 64 capsule 6  ? mometasone (NASONEX) 50 MCG/ACT nasal spray 2 sprays.    ? traMADol (ULTRAM) 50 MG tablet 1 tab PO q6 hours prn pain 15 tablet 0  ? ?No current facility-administered medications for this visit.  ? ? ?ALLERGIES:  ?Allergies  ?Allergen Reactions  ? Codeine   ?  Nausea and vomiting  ? Diovan [Valsartan]   ?  Rash and itching ?  ? Penicillins   ?  Rash and "breaking out in whelps" ?  ? ? ?PHYSICAL EXAM:  ?Performance status (ECOG): 1 - Symptomatic but completely ambulatory ? ?There were no  vitals filed for this visit. ?Wt Readings from Last 3 Encounters:  ?02/20/22 117 lb (53.1 kg)  ?01/21/22 114 lb 10.2 oz (52 kg)  ?01/14/22 118 lb (53.5 kg)  ? ?Physical Exam ?Vitals reviewed.  ?Constitutional:   ?   Appearance: Normal appearance.  ?Cardiovascular:  ?   Rate and Rhythm: Normal rate and regular rhythm.  ?   Pulses: Normal pulses.  ?   Heart sounds: Normal heart sounds.  ?Pulmonary:  ?   Effort: Pulmonary effort is normal.  ?   Breath sounds: Normal breath sounds.  ?Skin: ?   Findings: No rash.  ?Neurological:  ?   General: No focal deficit present.  ?   Mental Status: She is alert and oriented to person, place, and time.  ?Psychiatric:     ?   Mood and Affect: Mood normal.     ?   Behavior: Behavior normal.  ? ? ?LABORATORY DATA:  ?I have reviewed the labs as listed.  ? ?  Latest Ref Rng & Units 04/02/2022  ?  2:03 PM 09/18/2021  ? 11:05 AM 05/01/2021  ? 10:16 AM  ?CBC  ?WBC 4.0 - 10.5 K/uL 5.0   5.0   5.2    ?Hemoglobin 12.0 - 15.0 g/dL 11.3   12.2   12.0    ?Hematocrit 36.0 - 46.0 % 34.7   37.2   37.2    ?Platelets 150 - 400 K/uL 258   292   297    ? ? ?  Latest Ref Rng & Units 04/02/2022  ?  2:03 PM 09/18/2021  ? 11:05 AM 05/01/2021  ? 10:16 AM  ?CMP  ?Glucose 70 - 99 mg/dL 103   96   88    ?BUN 8 - 23 mg/dL '24   15   16    '$ ?Creatinine 0.44 - 1.00 mg/dL 0.77   0.63   0.68    ?Sodium 135 - 145 mmol/L 136   137   138    ?Potassium 3.5 - 5.1 mmol/L 3.9   3.7   3.7    ?Chloride 98 - 111 mmol/L 104   103   102    ?CO2 22 - 32 mmol/L '26   27   28    '$ ?Calcium 8.9 - 10.3 mg/dL 8.4   8.5   8.7    ?Total Protein 6.5 - 8.1 g/dL 6.7   7.2   6.8    ?Total Bilirubin 0.3 - 1.2 mg/dL 0.6   0.9   0.7    ?Alkaline Phos 38 - 126 U/L 73  85   78    ?AST 15 - 41 U/L '5   18   17    '$ ?ALT 0 - 44 U/L '13   13   12    '$ ? ?   ?Component Value Date/Time  ? RBC 3.21 (L) 04/02/2022 1403  ? MCV 108.1 (H) 04/02/2022 1403  ? MCH 35.2 (H) 04/02/2022 1403  ? MCHC 32.6 04/02/2022 1403  ? RDW 14.6 04/02/2022 1403  ? LYMPHSABS 1.2 04/02/2022  1403  ? MONOABS 0.5 04/02/2022 1403  ? EOSABS 0.1 04/02/2022 1403  ? BASOSABS 0.1 04/02/2022 1403  ? ? ?DIAGNOSTIC IMAGING:  ?I have independently reviewed the scans and discussed with the patient. ?No results found.  ? ?ASSESSMENT:  ?1.  JAK2 positive polycythemia vera: ?- She reports that she has been on hydroxyurea for 17 years. ?- Denies any history of thrombosis. ? ? ?PLAN:  ?1.  JAK2 positive polycythemia vera: ?- She is tolerating hydroxyurea very well. ?- Denies any aquagenic pruritus or vasomotor symptoms. ?- She had left index finger surgery recently.  No infections.  No B symptoms. ?- Reviewed labs today which showed normal LFTs.  CBC shows hematocrit 35.  Hemoglobin 11.3.  White count and platelet count normal. ?- Continue hydroxyurea 1 tablet Monday through Friday.  RTC 6 months for follow-up. ?  ?  ?2.  Vitamin D deficiency: ?- Continue vitamin D every other week.  Vitamin D level is 78. ? ?Orders placed this encounter:  ?No orders of the defined types were placed in this encounter. ? ? ? ?Derek Jack, MD ?[facility] ?[phone] ? ? ?I, Thana Ates, am acting as a scribe for Dr. Derek Jack. ? ?I, Derek Jack MD, have reviewed the above documentation for accuracy and completeness, and I agree with the above. ?  ? ? ?

## 2022-04-09 NOTE — Patient Instructions (Addendum)
[facility] at [facility] ?Discharge Instructions ? ?You were seen and examined today by Dr. Delton Coombes. ? ?Dr. Delton Coombes discussed your most recent lab work and everything looks good. ? ?Please keep follow-up appointments as scheduled in 6 months. ? ? ? ?Thank you for choosing [facility] at [facility] to provide your oncology and hematology care.  To afford each patient quality time with our provider, please arrive at least 15 minutes before your scheduled appointment time.  ? ?If you have a lab appointment with the [facility] please come in thru the Main Entrance and check in at the main information desk. ? ?You need to re-schedule your appointment should you arrive 10 or more minutes late.  We strive to give you quality time with our providers, and arriving late affects you and other patients whose appointments are after yours.  Also, if you no show three or more times for appointments you may be dismissed from the clinic at the providers discretion.     ?Again, thank you for choosing [facility].  Our hope is that these requests will decrease the amount of time that you wait before being seen by our physicians.       ?_____________________________________________________________ ? ?Should you have questions after your visit to [facility], please contact our office at [phone] and follow the prompts.  Our office hours are 8:00 a.m. and 4:30 p.m. Monday - Friday.  Please note that voicemails left after 4:00 p.m. may not be returned until the following business day.  We are closed weekends and major holidays.  You do have access to a nurse 24-7, just call the main number to the clinic [phone] and do not press any options, hold on the line and a nurse will answer the phone.   ? ?For prescription refill requests, have your pharmacy contact our office and allow 72 hours.   ? ?Due to Covid, you will need to wear a mask upon  entering the hospital. If you do not have a mask, a mask will be given to you at the Main Entrance upon arrival. For doctor visits, patients may have 1 support person age 41 or older with them. For treatment visits, patients can not have anyone with them due to social distancing guidelines and our immunocompromised population.  ? ?  ?

## 2022-04-23 DIAGNOSIS — D45 Polycythemia vera: Secondary | ICD-10-CM | POA: Diagnosis not present

## 2022-04-23 DIAGNOSIS — Z681 Body mass index (BMI) 19 or less, adult: Secondary | ICD-10-CM | POA: Diagnosis not present

## 2022-04-23 DIAGNOSIS — I10 Essential (primary) hypertension: Secondary | ICD-10-CM | POA: Diagnosis not present

## 2022-04-23 DIAGNOSIS — Z0001 Encounter for general adult medical examination with abnormal findings: Secondary | ICD-10-CM | POA: Diagnosis not present

## 2022-04-23 DIAGNOSIS — M1991 Primary osteoarthritis, unspecified site: Secondary | ICD-10-CM | POA: Diagnosis not present

## 2022-04-23 DIAGNOSIS — M810 Age-related osteoporosis without current pathological fracture: Secondary | ICD-10-CM | POA: Diagnosis not present

## 2022-05-03 ENCOUNTER — Other Ambulatory Visit (HOSPITAL_COMMUNITY): Payer: Self-pay | Admitting: Hematology

## 2022-05-03 DIAGNOSIS — E559 Vitamin D deficiency, unspecified: Secondary | ICD-10-CM

## 2022-05-14 ENCOUNTER — Ambulatory Visit: Payer: Medicare Other | Admitting: Obstetrics & Gynecology

## 2022-05-28 ENCOUNTER — Ambulatory Visit: Payer: Medicare Other | Admitting: Obstetrics & Gynecology

## 2022-06-17 ENCOUNTER — Encounter: Payer: Self-pay | Admitting: Obstetrics & Gynecology

## 2022-06-17 ENCOUNTER — Ambulatory Visit (INDEPENDENT_AMBULATORY_CARE_PROVIDER_SITE_OTHER): Payer: Medicare Other | Admitting: Obstetrics & Gynecology

## 2022-06-17 VITALS — BP 146/71 | HR 66 | Ht 64.0 in | Wt 116.5 lb

## 2022-06-17 DIAGNOSIS — N813 Complete uterovaginal prolapse: Secondary | ICD-10-CM | POA: Diagnosis not present

## 2022-06-17 DIAGNOSIS — Z4689 Encounter for fitting and adjustment of other specified devices: Secondary | ICD-10-CM

## 2022-06-17 NOTE — Progress Notes (Signed)
Chief Complaint  Patient presents with   Pessary Check    Blood pressure (!) 146/71, pulse 66, height '5\' 4"'$  (1.626 m), weight 116 lb 8 oz (52.8 kg).  [name] presents today for routine follow up related to her pessary.   She uses a Milex ring with support #2 She reports no vaginal discharge and no vaginal bleeding   Likert scale(1 not bothersome -5 very bothersome)  :  1  Exam reveals no undue vaginal mucosal pressure of breakdown, no discharge and no vaginal bleeding.  Vaginal Epithelial Abnormality Classification System:   0 0    No abnormalities 1    Epithelial erythema 2    Granulation tissue 3    Epithelial break or erosion, 1 cm or less 4    Epithelial break or erosion, 1 cm or greater  The pessary is removed, cleaned and replaced without difficulty.      ICD-10-CM   1. Pessary maintenance, Milex ring with support #2  Z46.89     2. Uterovaginal prolapse, complete  N81.3        KATJA BLUE will be sen back in 4 months for continued follow up.  Florian Buff, MD  06/17/2022 2:56 PM

## 2022-10-08 ENCOUNTER — Inpatient Hospital Stay: Payer: Medicare Other | Attending: Hematology

## 2022-10-08 DIAGNOSIS — Z7982 Long term (current) use of aspirin: Secondary | ICD-10-CM | POA: Insufficient documentation

## 2022-10-08 DIAGNOSIS — I10 Essential (primary) hypertension: Secondary | ICD-10-CM | POA: Diagnosis not present

## 2022-10-08 DIAGNOSIS — Z79899 Other long term (current) drug therapy: Secondary | ICD-10-CM | POA: Diagnosis not present

## 2022-10-08 DIAGNOSIS — D45 Polycythemia vera: Secondary | ICD-10-CM | POA: Insufficient documentation

## 2022-10-08 DIAGNOSIS — E559 Vitamin D deficiency, unspecified: Secondary | ICD-10-CM | POA: Diagnosis not present

## 2022-10-08 LAB — COMPREHENSIVE METABOLIC PANEL
ALT: 13 U/L (ref 0–44)
AST: 18 U/L (ref 15–41)
Albumin: 3.8 g/dL (ref 3.5–5.0)
Alkaline Phosphatase: 77 U/L (ref 38–126)
Anion gap: 5 (ref 5–15)
BUN: 20 mg/dL (ref 8–23)
CO2: 28 mmol/L (ref 22–32)
Calcium: 8.6 mg/dL — ABNORMAL LOW (ref 8.9–10.3)
Chloride: 106 mmol/L (ref 98–111)
Creatinine, Ser: 0.64 mg/dL (ref 0.44–1.00)
GFR, Estimated: >60 mL/min (ref >60)
Glucose, Bld: 96 mg/dL (ref 70–99)
Potassium: 4.2 mmol/L (ref 3.5–5.1)
Sodium: 139 mmol/L (ref 135–145)
Total Bilirubin: 0.8 mg/dL (ref 0.3–1.2)
Total Protein: 6.7 g/dL (ref 6.5–8.1)

## 2022-10-08 LAB — CBC WITH DIFFERENTIAL/PLATELET
Abs Immature Granulocytes: 0.02 10*3/uL (ref 0.00–0.07)
Basophils Absolute: 0.1 10*3/uL (ref 0.0–0.1)
Basophils Relative: 1 %
Eosinophils Absolute: 0.1 10*3/uL (ref 0.0–0.5)
Eosinophils Relative: 2 %
HCT: 35 % — ABNORMAL LOW (ref 36.0–46.0)
Hemoglobin: 11.4 g/dL — ABNORMAL LOW (ref 12.0–15.0)
Immature Granulocytes: 0 %
Lymphocytes Relative: 20 %
Lymphs Abs: 1 10*3/uL (ref 0.7–4.0)
MCH: 35.3 pg — ABNORMAL HIGH (ref 26.0–34.0)
MCHC: 32.6 g/dL (ref 30.0–36.0)
MCV: 108.4 fL — ABNORMAL HIGH (ref 80.0–100.0)
Monocytes Absolute: 0.5 10*3/uL (ref 0.1–1.0)
Monocytes Relative: 9 %
Neutro Abs: 3.5 10*3/uL (ref 1.7–7.7)
Neutrophils Relative %: 68 %
Platelets: 248 10*3/uL (ref 150–400)
RBC: 3.23 MIL/uL — ABNORMAL LOW (ref 3.87–5.11)
RDW: 14.6 % (ref 11.5–15.5)
WBC: 5.1 10*3/uL (ref 4.0–10.5)
nRBC: 0 % (ref 0.0–0.2)

## 2022-10-08 LAB — LACTATE DEHYDROGENASE: LDH: 137 U/L (ref 98–192)

## 2022-10-08 LAB — VITAMIN D 25 HYDROXY (VIT D DEFICIENCY, FRACTURES): Vit D, 25-Hydroxy: 75.03 ng/mL (ref 30–100)

## 2022-10-15 ENCOUNTER — Inpatient Hospital Stay (HOSPITAL_BASED_OUTPATIENT_CLINIC_OR_DEPARTMENT_OTHER): Payer: Medicare Other | Admitting: Physician Assistant

## 2022-10-15 VITALS — BP 99/88 | HR 57 | Temp 97.9°F | Resp 17 | Ht 64.0 in | Wt 116.8 lb

## 2022-10-15 DIAGNOSIS — E559 Vitamin D deficiency, unspecified: Secondary | ICD-10-CM | POA: Diagnosis not present

## 2022-10-15 DIAGNOSIS — Z7982 Long term (current) use of aspirin: Secondary | ICD-10-CM | POA: Diagnosis not present

## 2022-10-15 DIAGNOSIS — Z79899 Other long term (current) drug therapy: Secondary | ICD-10-CM | POA: Diagnosis not present

## 2022-10-15 DIAGNOSIS — I10 Essential (primary) hypertension: Secondary | ICD-10-CM | POA: Diagnosis not present

## 2022-10-15 DIAGNOSIS — D45 Polycythemia vera: Secondary | ICD-10-CM

## 2022-10-15 NOTE — Progress Notes (Signed)
[name], [place] [address]   CLINIC:  Medical Oncology/Hematology  PCP:  [name], [place] [address] [phone]   REASON FOR VISIT:  Follow-up for JAK2 positive polycythemia vera  PRIOR THERAPY: None  CURRENT THERAPY: Hydrea  INTERVAL HISTORY:  [name] 86 y.o. female returns for routine follow-up of JAK2 positive polycythemia vera.  She was last seen by Dr. Delton Coombes on 04/09/2022.  At today's visit, she reports feeling well.  No recent hospitalizations, surgeries, or changes in baseline health status.  She continues to take Hydrea 500 mg Monday through Friday, and is tolerating it well.  She denies any abnormal fatigue, mouth sores, nonhealing skin sores, or GI side effects.  She takes aspirin 81 mg daily.  No history of DVT, PE, MI, or CVA.  She denies any aquagenic pruritus, Raynaud's, erythromelalgia, or other vasomotor symptoms.  She denies any B-symptoms, abdominal pain, nausea, or early satiety.  She has 80% energy and 100% appetite. She endorses that she is maintaining a stable weight.   REVIEW OF SYSTEMS: Patient denies any complaints at today's visit. Review of Systems  Constitutional:  Negative for appetite change, chills, diaphoresis, fatigue, fever and unexpected weight change.  HENT:   Negative for lump/mass and nosebleeds.   Eyes:  Negative for eye problems.  Respiratory:  Negative for cough, hemoptysis and shortness of breath.   Cardiovascular:  Negative for chest pain, leg swelling and palpitations.  Gastrointestinal:  Negative for abdominal pain, blood in stool, constipation, diarrhea, nausea and vomiting.  Genitourinary:  Negative for hematuria.   Skin: Negative.   Neurological:  Negative for dizziness, headaches and light-headedness.  Hematological:  Does not bruise/bleed easily.      PAST MEDICAL/SURGICAL HISTORY:  Past Medical History:  Diagnosis Date   Hypertension     Influenza 11/2012   Osteoporosis    Polycythemia    Polycythemia vera(238.4) 02/25/2013   Diagnosed August 2005 on Hydrea 500 mg 3 times a week with excellent control.    Prolapse, uterus, congenital    ring placement   Past Surgical History:  Procedure Laterality Date   APPENDECTOMY     COLONOSCOPY     INCISION AND DRAINAGE Left 01/21/2022   Procedure: INCISION AND DRAINAGE LEFT INDEX FINGER;  Surgeon: Leanora Cover, MD;  Location: [facility];  Service: Orthopedics;  Laterality: Left;   OOPHORECTOMY     left   opperectomy       SOCIAL HISTORY:  Social History   Socioeconomic History   Marital status: Divorced    Spouse name: Not on file   Number of children: 1   Years of education: Not on file   Highest education level: Not on file  Occupational History   Not on file  Tobacco Use   Smoking status: Never   Smokeless tobacco: Never  Vaping Use   Vaping Use: Never used  Substance and Sexual Activity   Alcohol use: No   Drug use: No   Sexual activity: Yes    Birth control/protection: Post-menopausal  Other Topics Concern   Not on file  Social History Narrative   Not on file   Social Determinants of Health   Financial Resource Strain: Not on file  Food Insecurity: Not on file  Transportation Needs: Not on file  Physical Activity: Not on file  Stress: Not on file  Social Connections: Not on file  Intimate Partner Violence: Not on file  FAMILY HISTORY:  Family History  Problem Relation Age of Onset   Cancer Brother        throat cancer   Cancer Brother        lung cancer   Cancer Brother        prostate cancer    CURRENT MEDICATIONS:  Outpatient Encounter Medications as of 10/15/2022  Medication Sig   amLODipine (NORVASC) 5 MG tablet Take 5 mg by mouth daily.    aspirin 81 MG tablet Take 81 mg by mouth daily.   diltiazem (CARDIZEM CD) 180 MG 24 hr capsule Take 180 mg by mouth daily.    hydroxyurea (HYDREA) 500 MG capsule TAKE ONE  CAPSULE BY MOUTH ON MONDAY, TUESDAY, WEDNESDAY, THURSDAY & FRIDAY ONLY.   mometasone (NASONEX) 50 MCG/ACT nasal spray 2 sprays.   traMADol (ULTRAM) 50 MG tablet 1 tab PO q6 hours prn pain   Vitamin D, Ergocalciferol, (DRISDOL) 1.25 MG (50000 UNIT) CAPS capsule TAKE 1 CAPSULE BY MOUTH ONCE A WEEK.   No facility-administered encounter medications on file as of 10/15/2022.    ALLERGIES:  Allergies  Allergen Reactions   Codeine     Nausea and vomiting   Diovan [Valsartan]     Rash and itching    Penicillins     Rash and "breaking out in whelps"      PHYSICAL EXAM:  ECOG PERFORMANCE STATUS: 0 - Asymptomatic  There were no vitals filed for this visit. There were no vitals filed for this visit. Physical Exam Constitutional:      Appearance: Normal appearance.  HENT:     Head: Normocephalic and atraumatic.     Mouth/Throat:     Mouth: Mucous membranes are moist.  Eyes:     Extraocular Movements: Extraocular movements intact.     Pupils: Pupils are equal, round, and reactive to light.  Cardiovascular:     Rate and Rhythm: Normal rate and regular rhythm.     Pulses: Normal pulses.     Heart sounds: Normal heart sounds.  Pulmonary:     Effort: Pulmonary effort is normal.     Breath sounds: Normal breath sounds.  Abdominal:     General: Bowel sounds are normal.     Palpations: Abdomen is soft.     Tenderness: There is no abdominal tenderness.  Musculoskeletal:        General: No swelling.     Right lower leg: No edema.     Left lower leg: No edema.  Lymphadenopathy:     Cervical: No cervical adenopathy.  Skin:    General: Skin is warm and dry.  Neurological:     General: No focal deficit present.     Mental Status: She is alert and oriented to person, place, and time.  Psychiatric:        Mood and Affect: Mood normal.        Behavior: Behavior normal.      LABORATORY DATA:  I have reviewed the labs as listed.  CBC    Component Value Date/Time   WBC 5.1  10/08/2022 1402   RBC 3.23 (L) 10/08/2022 1402   HGB 11.4 (L) 10/08/2022 1402   HCT 35.0 (L) 10/08/2022 1402   PLT 248 10/08/2022 1402   MCV 108.4 (H) 10/08/2022 1402   MCH 35.3 (H) 10/08/2022 1402   MCHC 32.6 10/08/2022 1402   RDW 14.6 10/08/2022 1402   LYMPHSABS 1.0 10/08/2022 1402   MONOABS 0.5 10/08/2022 1402   EOSABS 0.1 10/08/2022 1402  BASOSABS 0.1 10/08/2022 1402      Latest Ref Rng & Units 10/08/2022    2:02 PM 04/02/2022    2:03 PM 09/18/2021   11:05 AM  CMP  Glucose 70 - 99 mg/dL 96  103  96   BUN 8 - 23 mg/dL _0 Creatinine 0.44 - 1.00 mg/dL 0.64  0.77  0.63   Sodium 135 - 145 mmol/L 139  136  137   Potassium 3.5 - 5.1 mmol/L 4.2  3.9  3.7   Chloride 98 - 111 mmol/L 106  104  103   CO2 22 - 32 mmol/L _1 Calcium 8.9 - 10.3 mg/dL 8.6  8.4  8.5   Total Protein 6.5 - 8.1 g/dL 6.7  6.7  7.2   Total Bilirubin 0.3 - 1.2 mg/dL 0.8  0.6  0.9   Alkaline Phos 38 - 126 U/L 77  73  85   AST 15 - 41 U/L 18  <5  18   ALT 0 - 44 U/L _2 DIAGNOSTIC IMAGING:  I have independently reviewed the relevant imaging and discussed with the patient.  ASSESSMENT & PLAN: 1.  JAK2 positive polycythemia vera - Initially diagnosed in August 2005.  Reports that she has been on hydroxyurea for 15+ years -- She did not have bone marrow biopsy - Denies any prior history of DVT, PE, MI, or CVA  - Current dose of Hydrea is 500 mg daily Monday through Friday. - She is tolerating hydroxyurea well. - Denies any aquagenic pruritus or vasomotor symptoms. - No recent infections or B symptoms.   - Most recent labs (10/08/2022): Hgb 11.4/MCV 108.4, platelets 248, WBC 5.1.  Normal LDH.  CMP unremarkable. - PLAN: Continue Hydrea 500 mg daily Monday through Friday.  Repeat labs in 6 months with RTC 1 week after.  2.  Vitamin D deficiency - Currently taking vitamin D every other week.   - Most recent vitamin D (10/08/2022) normal at 75.03 - PLAN: Recheck vitamin D annually  (next due October 2024)   PLAN SUMMARY & DISPOSITION: Repeat labs in 6 months (CBC, CMP, LDH) RTC 1 week after labs  All questions were answered. The patient knows to call the clinic with any problems, questions or concerns.  Medical decision making: Low  Time spent on visit: I spent 15 minutes counseling the patient face to face. The total time spent in the appointment was 22 minutes and more than 50% was on counseling.   Harriett Rush, PA-C  10/15/2022 6:16 PM

## 2022-10-15 NOTE — Patient Instructions (Signed)
[place] [facility] [place] **   You were seen today by Tarri Abernethy PA-C for your follow up visit.    POLYCYTHEMIA Your blood counts were all within their target range today. Continue to take your Hydrea 1 tablet (500 mg) every day Monday through Friday  VITAMIN D DEFICIENCY Your vitamin D levels are normal. Continue to take your vitamin D 50,000 units every other week  LABS: Return in 6 months for repeat labs  FOLLOW-UP APPOINTMENT: Office visit in 6 months, 1 week after labs  ** Thank you for trusting me with your healthcare!  I strive to provide all of my patients with quality care at each visit.  If you receive a survey for this visit, I would be so grateful to you for taking the time to provide feedback.  Thank you in advance!  ~ Berish Bohman                   Dr. Derek Jack   &   Tarri Abernethy, PA-C   - - - - - - - - - - - - - - - - - -    Thank you for choosing [place] at [facility] to provide your oncology and hematology care.  To afford each patient quality time with our provider, please arrive at least 15 minutes before your scheduled appointment time.   If you have a lab appointment with the [facility] please come in thru the Main Entrance and check in at the main information desk.  You need to re-schedule your appointment should you arrive 10 or more minutes late.  We strive to give you quality time with our providers, and arriving late affects you and other patients whose appointments are after yours.  Also, if you no show three or more times for appointments you may be dismissed from the clinic at the providers discretion.     Again, thank you for choosing [facility].  Our hope is that these requests will decrease the amount of time that you wait before being seen by our physicians.        _____________________________________________________________  Should you have questions after your visit to [facility] please contact our office at [phone] and follow the prompts.  Our office hours are 8:00 a.m. and 4:30 p.m. Monday - Friday.  Please note that voicemails left after 4:00 p.m. may not be returned until the following business day.  We are closed weekends and major holidays.  You do have access to a nurse 24-7, just call the main number to the clinic [phone] and do not press any options, hold on the line and a nurse will answer the phone.    For prescription refill requests, have your pharmacy contact our office and allow 72 hours.

## 2022-10-17 ENCOUNTER — Encounter: Payer: Self-pay | Admitting: Obstetrics & Gynecology

## 2022-10-17 ENCOUNTER — Ambulatory Visit (INDEPENDENT_AMBULATORY_CARE_PROVIDER_SITE_OTHER): Payer: Medicare Other | Admitting: Obstetrics & Gynecology

## 2022-10-17 VITALS — BP 149/61 | HR 65 | Ht 64.0 in | Wt 117.0 lb

## 2022-10-17 DIAGNOSIS — Z4689 Encounter for fitting and adjustment of other specified devices: Secondary | ICD-10-CM

## 2022-10-17 DIAGNOSIS — N813 Complete uterovaginal prolapse: Secondary | ICD-10-CM | POA: Diagnosis not present

## 2022-10-17 NOTE — Progress Notes (Signed)
Chief Complaint  Patient presents with   Pessary Check    Blood pressure (!) 149/61, pulse 65, height '5\' 4"'$  (1.626 m), weight 117 lb (53.1 kg).  [name] presents today for routine follow up related to her pessary.   She uses a Milex ring with support #2 She reports no vaginal discharge and no vaginal bleeding   Likert scale(1 not bothersome -5 very bothersome)  :  1  Exam reveals no undue vaginal mucosal pressure of breakdown, no discharge and no vaginal bleeding.  Vaginal Epithelial Abnormality Classification System:   0 0    No abnormalities 1    Epithelial erythema 2    Granulation tissue 3    Epithelial break or erosion, 1 cm or less 4    Epithelial break or erosion, 1 cm or greater  The pessary is removed, cleaned and replaced without difficulty.      ICD-10-CM   1. Pessary maintenance, Milex ring with support #2  Z46.89     2. Uterovaginal prolapse, complete  N81.3        [name] will be sen back in 4 months for continued follow up.  Florian Buff, MD  10/17/2022 3:05 PM   s

## 2022-10-30 ENCOUNTER — Other Ambulatory Visit (HOSPITAL_COMMUNITY): Payer: Self-pay | Admitting: Hematology

## 2022-10-30 DIAGNOSIS — E559 Vitamin D deficiency, unspecified: Secondary | ICD-10-CM

## 2022-12-20 ENCOUNTER — Emergency Department (HOSPITAL_COMMUNITY)
Admission: EM | Admit: 2022-12-20 | Discharge: 2022-12-20 | Disposition: A | Discharge status: Home / Self Care | Payer: Medicare Other | Attending: Emergency Medicine | Admitting: Emergency Medicine

## 2022-12-20 ENCOUNTER — Other Ambulatory Visit: Payer: Self-pay

## 2022-12-20 ENCOUNTER — Emergency Department (HOSPITAL_COMMUNITY): Payer: Medicare Other

## 2022-12-20 ENCOUNTER — Encounter (HOSPITAL_COMMUNITY): Payer: Self-pay | Admitting: Emergency Medicine

## 2022-12-20 DIAGNOSIS — S52392A Other fracture of shaft of radius, left arm, initial encounter for closed fracture: Secondary | ICD-10-CM | POA: Diagnosis not present

## 2022-12-20 DIAGNOSIS — Z7982 Long term (current) use of aspirin: Secondary | ICD-10-CM | POA: Insufficient documentation

## 2022-12-20 DIAGNOSIS — Z79899 Other long term (current) drug therapy: Secondary | ICD-10-CM | POA: Diagnosis not present

## 2022-12-20 DIAGNOSIS — S52352A Displaced comminuted fracture of shaft of radius, left arm, initial encounter for closed fracture: Secondary | ICD-10-CM | POA: Diagnosis not present

## 2022-12-20 DIAGNOSIS — S52252A Displaced comminuted fracture of shaft of ulna, left arm, initial encounter for closed fracture: Secondary | ICD-10-CM | POA: Diagnosis not present

## 2022-12-20 DIAGNOSIS — M79602 Pain in left arm: Secondary | ICD-10-CM | POA: Diagnosis not present

## 2022-12-20 DIAGNOSIS — S52292A Other fracture of shaft of left ulna, initial encounter for closed fracture: Secondary | ICD-10-CM | POA: Diagnosis not present

## 2022-12-20 DIAGNOSIS — W01198A Fall on same level from slipping, tripping and stumbling with subsequent striking against other object, initial encounter: Secondary | ICD-10-CM | POA: Insufficient documentation

## 2022-12-20 DIAGNOSIS — W19XXXA Unspecified fall, initial encounter: Secondary | ICD-10-CM

## 2022-12-20 DIAGNOSIS — I10 Essential (primary) hypertension: Secondary | ICD-10-CM | POA: Diagnosis not present

## 2022-12-20 DIAGNOSIS — S59912A Unspecified injury of left forearm, initial encounter: Secondary | ICD-10-CM | POA: Diagnosis present

## 2022-12-20 MED ORDER — HYDROCODONE-ACETAMINOPHEN 5-325 MG PO TABS: ORAL_TABLET | ORAL | 0 refills | Status: DC

## 2022-12-20 MED ORDER — TRAMADOL HCL 50 MG PO TABS: 50.0000 mg | ORAL_TABLET | Freq: Four times a day (QID) | ORAL | 0 refills | Status: DC | PRN

## 2022-12-20 NOTE — ED Triage Notes (Signed)
Pt reports she had a slip and fall outside, denies LOC, hit her head on the ground, c/o left arm pain from elbow to wrist; not on thinners

## 2022-12-20 NOTE — Discharge Instructions (Signed)
Follow-up with Dr. Aline Brochure next week and keep your arm elevated is much as possible

## 2022-12-20 NOTE — ED Provider Notes (Signed)
MSE note.  Patient states that she fell in the yard onto her left forearm.  Physical exam tenderness left forearm but neurovascular exam is normal.   Milton Ferguson, MD 12/20/22 1607

## 2022-12-20 NOTE — ED Provider Notes (Signed)
[facility] EMERGENCY DEPARTMENT Provider Note   CSN: [id number] Arrival date & time: 12/20/22  1429     History {Add pertinent medical, surgical, social history, OB history to HPI:1} Chief Complaint  Patient presents with   Lytle Michaels    [name] is a 87 y.o. female.  Patient has history of hypertension.  She slipped and fell on her left arm.  She also hit her head but no loss of consciousness and this occurred in the grass   Fall       Home Medications Prior to Admission medications   Medication Sig Start Date End Date Taking? Authorizing Provider  HYDROcodone-acetaminophen (NORCO/VICODIN) 5-325 MG tablet Take 1 every 8 hours for pain has not relieved by Tylenol alone 12/20/22  Yes Milton Ferguson, MD  amLODipine (NORVASC) 5 MG tablet Take 5 mg by mouth daily.  11/03/17   [provider]  aspirin 81 MG tablet Take 81 mg by mouth daily.    [provider]  diltiazem (CARDIZEM CD) 180 MG 24 hr capsule Take 180 mg by mouth daily.     [provider]  hydroxyurea (HYDREA) 500 MG capsule TAKE ONE CAPSULE BY MOUTH ON MONDAY, TUESDAY, WEDNESDAY, THURSDAY & FRIDAY ONLY. 11/12/21   Derek Jack, MD  mometasone (NASONEX) 50 MCG/ACT nasal spray 2 sprays. 11/23/20   [provider]  traMADol Veatrice Bourbon) 50 MG tablet 1 tab PO q6 hours prn pain 01/21/22   Leanora Cover, MD  Vitamin D, Ergocalciferol, (DRISDOL) 1.25 MG (50000 UNIT) CAPS capsule TAKE 1 CAPSULE BY MOUTH ONCE A WEEK. 10/30/22   Derek Jack, MD      Allergies    Codeine, Diovan [valsartan], and Penicillins    Review of Systems   Review of Systems  Physical Exam Updated Vital Signs BP (!) 142/63 (BP Location: Right Arm)   Pulse 72   Temp 98.4 F (36.9 C) (Oral)   Resp 16   Ht '5\' 4"'$  (1.626 m)   Wt 53.1 kg   SpO2 98%   BMI 20.08 kg/m  Physical Exam  ED Results / Procedures / Treatments   Labs (all labs ordered are listed, but only abnormal results are displayed) Labs  Reviewed - No data to display  EKG None  Radiology DG Forearm Left  Result Date: 12/20/2022 CLINICAL DATA:  Status post fall. EXAM: LEFT FOREARM - 2 VIEW COMPARISON:  None Available. FINDINGS: There is an acute and comminuted fracture deformity involving the proximal shaft of the radius. There is dorsal displacement of the distal fracture fragments by at least 1 full shaft's with. Acute and comminuted fracture deformity is noted involving the distal shaft of the ulna with mild dorsal displacement by 1 shaft's width. IMPRESSION: Acute and comminuted fracture deformity involves the proximal shaft of the radius and distal shaft of the ulna. Electronically Signed   By: Kerby Moors M.D.   On: 12/20/2022 16:36    Procedures Procedures  {Document cardiac monitor, telemetry assessment procedure when appropriate:1}  Medications Ordered in ED Medications - No data to display  ED Course/ Medical Decision Making/ A&P                           Medical Decision Making Amount and/or Complexity of Data Reviewed Radiology: ordered.  Risk Prescription drug management.   Patient has a midshaft fracture of her left ulnar and radius.  It is closed.  I spoke with Dr. Aline Brochure and we have put  her on a sugar-tong splint and she will follow-up with him next week  {Document critical care time when appropriate:1} {Document review of labs and clinical decision tools ie heart score, Chads2Vasc2 etc:1}  {Document your independent review of radiology images, and any outside records:1} {Document your discussion with family members, caretakers, and with consultants:1} {Document social determinants of health affecting pt's care:1} {Document your decision making why or why not admission, treatments were needed:1} Final Clinical Impression(s) / ED Diagnoses Final diagnoses:  Fall, initial encounter    Rx / DC Orders ED Discharge Orders          Ordered    HYDROcodone-acetaminophen (NORCO/VICODIN) 5-325 MG  tablet        12/20/22 1818

## 2022-12-23 ENCOUNTER — Ambulatory Visit (INDEPENDENT_AMBULATORY_CARE_PROVIDER_SITE_OTHER): Payer: Medicare Other | Admitting: Orthopedic Surgery

## 2022-12-23 ENCOUNTER — Telehealth: Payer: Self-pay | Admitting: Orthopedic Surgery

## 2022-12-23 VITALS — BP 154/72 | HR 73 | Ht 64.0 in | Wt 117.0 lb

## 2022-12-23 DIAGNOSIS — S52202A Unspecified fracture of shaft of left ulna, initial encounter for closed fracture: Secondary | ICD-10-CM | POA: Diagnosis not present

## 2022-12-23 DIAGNOSIS — S52302A Unspecified fracture of shaft of left radius, initial encounter for closed fracture: Secondary | ICD-10-CM

## 2022-12-23 DIAGNOSIS — Z01818 Encounter for other preprocedural examination: Secondary | ICD-10-CM | POA: Diagnosis not present

## 2022-12-23 NOTE — Telephone Encounter (Signed)
I called patient to schedule appt per staff message.    Patient states that her fingers are swollen real bad and the cast is to tight for her and it is heavy.   Please call her back at [phone]  I have scheduled her for next Wednesday.

## 2022-12-23 NOTE — Patient Instructions (Signed)
Your surgery will be at [facility] by Dr Harrison  The hospital will contact you with a preoperative appointment to discuss Anesthesia.  Please arrive on time or 15 minutes early for the preoperative appointment, they have a very tight schedule if you are late or do not come in your surgery will be cancelled.  The phone number is [phone]. Please bring your medications with you for the appointment. They will tell you the arrival time and medication instructions when you have your preoperative evaluation. Do not wear nail polish the day of your surgery and if you take Phentermine you need to stop this medication ONE WEEK prior to your surgery. If you take Invokana, Farxiga, Jardiance, or Steglatro) - Hold 72 hours before the procedure.  If you take Ozempic,  Bydureon or Trulicity do not take for 8 days before your surgery. If you take Victoza, Rybelsis, Saxenda or Adlyxi stop 24 hours before the procedure.  Please arrive at the hospital 2 hours before procedure if scheduled at 9:30 or later in the day or at the time the nurse tells you at your preoperative visit.   If you have my chart do not use the time given in my chart use the time given to you by the nurse during your preoperative visit.   Your surgery  time may change. Please be available for phone calls the day of your surgery and the day before. The Short Stay department may need to discuss changes about your surgery time. Not reaching the you could lead to procedure delays and possible cancellation.  You must have a ride home and someone to stay with you for 24 to 48 hours. The person taking you home will receive and sign for the your discharge instructions.  Please be prepared to give your support person's name and telephone number to Central Registration. Dr Harrison will need that name and phone number post procedure.   

## 2022-12-23 NOTE — Progress Notes (Signed)
Chief Complaint  Patient presents with   Wrist Injury    Left wrist fracture, DOI 12-20-22.   NEW PROBLEM//OFFICE VISIT  Encounter Diagnoses  Name Primary?   Pre-op exam    Fracture of shaft of ulna and radius, left, closed, initial encounter Yes    This will require ORIF of the radius and ulna  I will discussed the polycythemia vera with Dr. Delton Coombes but should not prohibit [place] from doing surgery   The procedure has been fully reviewed with the patient; The risks and benefits of surgery have been discussed and explained and understood. Alternative treatment has also been reviewed, questions were encouraged and answered. The postoperative plan is also been reviewed.    Chief Complaint  Patient presents with   Wrist Injury    Left wrist fracture, DOI 12-20-22.   HPI 87 year old female fell on January 5.  She presented to the emergency room at [facility] with pain and deformity of the left upper extremity.  X-rays show both bones forearm fracture left forearm.  She came in today to have her splint evaluated and to be advised on treatment and management   ROS: Patient is healthy other than some polycythemia vera has no renal findings on review of systems  Allergies  Allergen Reactions   Codeine     Nausea and vomiting   Diovan [Valsartan]     Rash and itching    Penicillins     Rash and "breaking out in whelps"     Current Outpatient Medications  Medication Instructions   amLODipine (NORVASC) 5 mg, Oral, Daily   aspirin 81 mg, Oral, Daily,     diltiazem (CARDIZEM CD) 180 mg, Oral, Daily   hydroxyurea (HYDREA) 500 MG capsule TAKE ONE CAPSULE BY MOUTH ON MONDAY, TUESDAY, WEDNESDAY, THURSDAY & FRIDAY ONLY.   mometasone (NASONEX) 50 MCG/ACT nasal spray 2 sprays   traMADol (ULTRAM) 50 mg, Oral, Every 6 hours PRN   Vitamin D, Ergocalciferol, (DRISDOL) 1.25 MG (50000 UNIT) CAPS capsule TAKE 1 CAPSULE BY MOUTH ONCE A WEEK.       BP (!) 154/72   Pulse 73   Ht  '5\' 4"'$  (1.626 m)   Wt 117 lb (53.1 kg)   BMI 20.08 kg/m   Body mass index is 20.08 kg/m.  General appearance: Well-developed well-nourished no gross deformities  Cardiovascular normal pulse and perfusion normal color without edema  Neurologically no sensation loss or deficits or pathologic reflexes  Psychological: Awake alert and oriented x3 mood and affect normal  Skin no lacerations or ulcerations no nodularity no palpable masses, no erythema or nodularity  Musculoskeletal:  Left was removed did not see anything untoward.  It was probably just too tight.  New sugar-tong was applied  Neurovascular exam was intact skin was normal.  She can move all her fingers.  She can make a fist.        Past Medical History:  Diagnosis Date   Hypertension    Influenza 11/2012   Osteoporosis    Polycythemia    Polycythemia vera(238.4) 02/25/2013   Diagnosed August 2005 on Hydrea 500 mg 3 times a week with excellent control.    Prolapse, uterus, congenital    ring placement    Past Surgical History:  Procedure Laterality Date   APPENDECTOMY     COLONOSCOPY     INCISION AND DRAINAGE Left 01/21/2022   Procedure: INCISION AND DRAINAGE LEFT INDEX FINGER;  Surgeon: Leanora Cover, MD;  Location: [place];  Service: Orthopedics;  Laterality: Left;   OOPHORECTOMY     left   opperectomy      Family History  Problem Relation Age of Onset   Cancer Brother        throat cancer   Cancer Brother        lung cancer   Cancer Brother        prostate cancer   Social History   Tobacco Use   Smoking status: Never   Smokeless tobacco: Never  Vaping Use   Vaping Use: Never used  Substance Use Topics   Alcohol use: No   Drug use: No    Allergies  Allergen Reactions   Codeine     Nausea and vomiting   Diovan [Valsartan]     Rash and itching    Penicillins     Rash and "breaking out in whelps"     Current Meds  Medication Sig   amLODipine (NORVASC) 5 MG  tablet Take 5 mg by mouth daily.    aspirin 81 MG tablet Take 81 mg by mouth daily.   diltiazem (CARDIZEM CD) 180 MG 24 hr capsule Take 180 mg by mouth daily.    hydroxyurea (HYDREA) 500 MG capsule TAKE ONE CAPSULE BY MOUTH ON MONDAY, TUESDAY, WEDNESDAY, THURSDAY & FRIDAY ONLY.   mometasone (NASONEX) 50 MCG/ACT nasal spray 2 sprays.   traMADol (ULTRAM) 50 MG tablet Take 1 tablet (50 mg total) by mouth every 6 (six) hours as needed.   Vitamin D, Ergocalciferol, (DRISDOL) 1.25 MG (50000 UNIT) CAPS capsule TAKE 1 CAPSULE BY MOUTH ONCE A WEEK.    No orders of the defined types were placed in this encounter.    Arther Abbott, MD  12/23/2022 3:01 PM

## 2022-12-24 NOTE — Patient Instructions (Signed)
[name]  12/24/2022     '@[email or web address]'$ @   Your procedure is scheduled on  12/27/2022.   Report to [facility] at  1030 A.M.   Call this number if you have problems the morning of surgery:  [phone]  If you experience any cold or flu symptoms such as cough, fever, chills, shortness of breath, etc. between now and your scheduled surgery, please notify us at the above number.   Remember:  Do not eat or drink after midnight.      Take these medicines the morning of surgery with A SIP OF WATER         amlodipine, diltiazem, tramadol (if needed).     Do not wear jewelry, make-up or nail polish.  Do not wear lotions, powders, or perfumes, or deodorant.  Do not shave 48 hours prior to surgery.  Men may shave face and neck.  Do not bring valuables to the hospital.  [facility] is not responsible for any belongings or valuables.  Contacts, dentures or bridgework may not be worn into surgery.  Leave your suitcase in the car.  After surgery it may be brought to your room.  For patients admitted to the hospital, discharge time will be determined by your treatment team.  Patients discharged the day of surgery will not be allowed to drive home and must have someone with them for 24 hours.    Special instructions:   DO NOT smoke tobacco or vape for 24 hours before your procedure.  Please read over the following fact sheets that you were given. Pain Booklet, Coughing and Deep Breathing, Surgical Site Infection Prevention, Anesthesia Post-op Instructions, and Care and Recovery After Surgery      Wrist Fracture Treated With ORIF, Care After This sheet gives you information about how to care for yourself after your procedure. Your health care provider may also give you more specific instructions. If you have problems or questions, contact your health care provider. What can I expect after the procedure? After the procedure, it is common to  have: Pain. Swelling. Stiffness. A small amount of drainage from the incision. Follow these instructions at home: If you have a cast: Do not stick anything inside the cast to scratch your skin. Doing that increases your risk of infection. Check the skin around the cast every day. Tell your health care provider about any concerns. You may put lotion on dry skin around the edges of the cast. Do not put lotion on the skin underneath the cast. Keep the cast clean and dry. If you have a splint or sling: Wear the splint or sling as told by your health care provider. Remove it only as told by your health care provider. Loosen the splint or sling if your fingers tingle, become numb, or turn cold and blue. Keep the splint or sling clean and dry. Bathing Do not take baths, swim, or use a hot tub until your health care provider approves. Ask your health care provider if you may take showers. You may only be allowed to take sponge baths. If your cast, splint, or sling is not waterproof: Do not let it get wet. Cover it with a watertight covering when you take a bath or shower. If you have a sling, remove it for bathing only if your health care provider tells you it is safe to do that. Keep the bandage (dressing) dry until your health care provider says it can be removed.  Incision care  Follow instructions from your health care provider about how to take care of your incision. Make sure you: Wash your hands with soap and water for at least 20 seconds before and after you change your dressing. If soap and water are not available, use hand sanitizer. Change your dressing as told by your health care provider. Leave stitches (sutures), skin glue, or adhesive strips in place. These skin closures may need to stay in place for 2 weeks or longer. If adhesive strip edges start to loosen and curl up, you may trim the loose edges. Do not remove adhesive strips completely unless your health care provider tells you  to do that. Check your incision area every day for signs of infection. Check for: Redness. More swelling or pain. Blood or more fluid. Warmth. Pus or a bad smell. Managing pain, stiffness, and swelling  If directed, put ice on the injured area. To do this: If you have a removable splint or sling, remove it as told by your health care provider. Put ice in a plastic bag. Place a towel between your skin and the bag or between your cast and the bag. Leave the ice on for 20 minutes, 2-3 times a day. Remove the ice if your skin turns bright red. This is very important. If you cannot feel pain, heat, or cold, you have a greater risk of damage to the area. Move your fingers often to reduce stiffness and swelling. Raise (elevate) the injured area above the level of your heart while you are sitting or lying down. Driving If you were given a sedative during the procedure, it can affect you for several hours. Do not drive or operate machinery until your health care provider says that it is safe. Ask your health care provider when it is safe to drive if you have a cast, splint, or sling on your wrist. Activity Return to your normal activities as told by your health care provider. Ask your health care provider what activities are safe for you. Do exercises as told by your health care provider. Do not lift with or put weight on your injured wrist until your health care provider approves. Avoid pulling and pushing. Medicines Take over-the-counter and prescription medicines only as told by your health care provider. Ask your health care provider if the medicine prescribed to you: Requires you to avoid driving or using machinery. Can cause constipation. You may need to take these actions to prevent or treat constipation: Drink enough fluid to keep your urine pale yellow. Take over-the-counter or prescription medicines. Eat foods that are high in fiber, such as beans, whole grains, and fresh fruits and  vegetables. Limit foods that are high in fat and processed sugars, such as fried or sweet foods. General instructions Do not put pressure on any part of the cast or splint until it is fully hardened. This may take several hours. Do not use any products that contain nicotine or tobacco, such as cigarettes, e-cigarettes, and chewing tobacco. These can delay bone healing after surgery. If you need help quitting, ask your health care provider. Keep all follow-up visits. This is important. Contact a health care provider if: Your cast, splint, or sling is damaged or loose. Your pain is not controlled with medicine. You have any of these signs of infection: A fever. Redness around your incision. More swelling or pain around your incision. Blood or more fluid coming from your incision. Warmth coming from your incision. Pus or a bad smell  coming from your incision or your dressing. You develop a rash. Get help right away if: Your skin or fingers on your injured arm turn blue or gray. Your arm feels cold or numb. You have severe pain in your injured wrist. You have trouble breathing. You feel faint or light-headed. Summary After the procedure, it is common to have pain, swelling, stiffness, and a small amount of drainage from the incision. You may use ice, elevation, and pain medicine as told by your health care provider to reduce pain and swelling. Wear your splint or sling as told by your health care provider. Do not lift with or put weight on your injured wrist until your health care provider approves. This information is not intended to replace advice given to you by your health care provider. Make sure you discuss any questions you have with your health care provider. Document Revised: 03/14/2020 Document Reviewed: 03/14/2020 Elsevier Patient Education  [place]. How to Use Chlorhexidine Before Surgery Chlorhexidine gluconate (CHG) is a germ-killing (antiseptic) solution that is  used to clean the skin. It can get rid of the bacteria that normally live on the skin and can keep them away for about 24 hours. To clean your skin with CHG, you may be given: A CHG solution to use in the shower or as part of a sponge bath. A prepackaged cloth that contains CHG. Cleaning your skin with CHG may help lower the risk for infection: While you are staying in the intensive care unit of the hospital. If you have a vascular access, such as a central line, to provide short-term or long-term access to your veins. If you have a catheter to drain urine from your bladder. If you are on a ventilator. A ventilator is a machine that helps you breathe by moving air in and out of your lungs. After surgery. What are the risks? Risks of using CHG include: A skin reaction. Hearing loss, if CHG gets in your ears and you have a perforated eardrum. Eye injury, if CHG gets in your eyes and is not rinsed out. The CHG product catching fire. Make sure that you avoid smoking and flames after applying CHG to your skin. Do not use CHG: If you have a chlorhexidine allergy or have previously reacted to chlorhexidine. On babies younger than 58 months of age. How to use CHG solution Use CHG only as told by your health care provider, and follow the instructions on the label. Use the full amount of CHG as directed. Usually, this is one bottle. During a shower Follow these steps when using CHG solution during a shower (unless your health care provider gives you different instructions): Start the shower. Use your normal soap and shampoo to wash your face and hair. Turn off the shower or move out of the shower stream. Pour the CHG onto a clean washcloth. Do not use any type of brush or rough-edged sponge. Starting at your neck, lather your body down to your toes. Make sure you follow these instructions: If you will be having surgery, pay special attention to the part of your body where you will be having  surgery. Scrub this area for at least 1 minute. Do not use CHG on your head or face. If the solution gets into your ears or eyes, rinse them well with water. Avoid your genital area. Avoid any areas of skin that have broken skin, cuts, or scrapes. Scrub your back and under your arms. Make sure to wash skin folds.  Let the lather sit on your skin for 1-2 minutes or as long as told by your health care provider. Thoroughly rinse your entire body in the shower. Make sure that all body creases and crevices are rinsed well. Dry off with a clean towel. Do not put any substances on your body afterward--such as powder, lotion, or perfume--unless you are told to do so by your health care provider. Only use lotions that are recommended by the manufacturer. Put on clean clothes or pajamas. If it is the night before your surgery, sleep in clean sheets.  During a sponge bath Follow these steps when using CHG solution during a sponge bath (unless your health care provider gives you different instructions): Use your normal soap and shampoo to wash your face and hair. Pour the CHG onto a clean washcloth. Starting at your neck, lather your body down to your toes. Make sure you follow these instructions: If you will be having surgery, pay special attention to the part of your body where you will be having surgery. Scrub this area for at least 1 minute. Do not use CHG on your head or face. If the solution gets into your ears or eyes, rinse them well with water. Avoid your genital area. Avoid any areas of skin that have broken skin, cuts, or scrapes. Scrub your back and under your arms. Make sure to wash skin folds. Let the lather sit on your skin for 1-2 minutes or as long as told by your health care provider. Using a different clean, wet washcloth, thoroughly rinse your entire body. Make sure that all body creases and crevices are rinsed well. Dry off with a clean towel. Do not put any substances on your body  afterward--such as powder, lotion, or perfume--unless you are told to do so by your health care provider. Only use lotions that are recommended by the manufacturer. Put on clean clothes or pajamas. If it is the night before your surgery, sleep in clean sheets. How to use CHG prepackaged cloths Only use CHG cloths as told by your health care provider, and follow the instructions on the label. Use the CHG cloth on clean, dry skin. Do not use the CHG cloth on your head or face unless your health care provider tells you to. When washing with the CHG cloth: Avoid your genital area. Avoid any areas of skin that have broken skin, cuts, or scrapes. Before surgery Follow these steps when using a CHG cloth to clean before surgery (unless your health care provider gives you different instructions): Using the CHG cloth, vigorously scrub the part of your body where you will be having surgery. Scrub using a back-and-forth motion for 3 minutes. The area on your body should be completely wet with CHG when you are done scrubbing. Do not rinse. Discard the cloth and let the area air-dry. Do not put any substances on the area afterward, such as powder, lotion, or perfume. Put on clean clothes or pajamas. If it is the night before your surgery, sleep in clean sheets.  For general bathing Follow these steps when using CHG cloths for general bathing (unless your health care provider gives you different instructions). Use a separate CHG cloth for each area of your body. Make sure you wash between any folds of skin and between your fingers and toes. Wash your body in the following order, switching to a new cloth after each step: The front of your neck, shoulders, and chest. Both of your arms, under your arms,  and your hands. Your stomach and groin area, avoiding the genitals. Your right leg and foot. Your left leg and foot. The back of your neck, your back, and your buttocks. Do not rinse. Discard the cloth and  let the area air-dry. Do not put any substances on your body afterward--such as powder, lotion, or perfume--unless you are told to do so by your health care provider. Only use lotions that are recommended by the manufacturer. Put on clean clothes or pajamas. Contact a health care provider if: Your skin gets irritated after scrubbing. You have questions about using your solution or cloth. You swallow any chlorhexidine. Call your local poison control center (1-570-217-0863 in the [place] Get help right away if: Your eyes itch badly, or they become very red or swollen. Your skin itches badly and is red or swollen. Your hearing changes. You have trouble seeing. You have swelling or tingling in your mouth or throat. You have trouble breathing. These symptoms may represent a serious problem that is an emergency. Do not wait to see if the symptoms will go away. Get medical help right away. Call your local emergency services (911 in the [place] Do not drive yourself to the hospital. Summary Chlorhexidine gluconate (CHG) is a germ-killing (antiseptic) solution that is used to clean the skin. Cleaning your skin with CHG may help to lower your risk for infection. You may be given CHG to use for bathing. It may be in a bottle or in a prepackaged cloth to use on your skin. Carefully follow your health care provider's instructions and the instructions on the product label. Do not use CHG if you have a chlorhexidine allergy. Contact your health care provider if your skin gets irritated after scrubbing. This information is not intended to replace advice given to you by your health care provider. Make sure you discuss any questions you have with your health care provider. Document Revised: 04/01/2022 Document Reviewed: 02/12/2021 Elsevier Patient Education  [place].

## 2022-12-25 ENCOUNTER — Encounter (HOSPITAL_COMMUNITY): Payer: Self-pay

## 2022-12-25 ENCOUNTER — Telehealth: Payer: Self-pay | Admitting: *Deleted

## 2022-12-25 ENCOUNTER — Encounter (HOSPITAL_COMMUNITY)
Admission: RE | Admit: 2022-12-25 | Discharge: 2022-12-25 | Disposition: A | Discharge status: Home / Self Care | Payer: Medicare Other | Source: Ambulatory Visit | Attending: Orthopedic Surgery | Admitting: Orthopedic Surgery

## 2022-12-25 DIAGNOSIS — S52202A Unspecified fracture of shaft of left ulna, initial encounter for closed fracture: Secondary | ICD-10-CM | POA: Diagnosis not present

## 2022-12-25 DIAGNOSIS — Z01812 Encounter for preprocedural laboratory examination: Secondary | ICD-10-CM | POA: Diagnosis not present

## 2022-12-25 DIAGNOSIS — Z01818 Encounter for other preprocedural examination: Secondary | ICD-10-CM

## 2022-12-25 DIAGNOSIS — S52302A Unspecified fracture of shaft of left radius, initial encounter for closed fracture: Secondary | ICD-10-CM | POA: Diagnosis not present

## 2022-12-25 LAB — CBC WITH DIFFERENTIAL/PLATELET
Abs Immature Granulocytes: 0.02 10*3/uL (ref 0.00–0.07)
Basophils Absolute: 0.1 10*3/uL (ref 0.0–0.1)
Basophils Relative: 2 %
Eosinophils Absolute: 0.2 10*3/uL (ref 0.0–0.5)
Eosinophils Relative: 3 %
HCT: 39.1 % (ref 36.0–46.0)
Hemoglobin: 12.5 g/dL (ref 12.0–15.0)
Immature Granulocytes: 0 %
Lymphocytes Relative: 17 %
Lymphs Abs: 1.1 10*3/uL (ref 0.7–4.0)
MCH: 34.3 pg — ABNORMAL HIGH (ref 26.0–34.0)
MCHC: 32 g/dL (ref 30.0–36.0)
MCV: 107.4 fL — ABNORMAL HIGH (ref 80.0–100.0)
Monocytes Absolute: 0.6 10*3/uL (ref 0.1–1.0)
Monocytes Relative: 9 %
Neutro Abs: 4.5 10*3/uL (ref 1.7–7.7)
Neutrophils Relative %: 69 %
Platelets: 335 10*3/uL (ref 150–400)
RBC: 3.64 MIL/uL — ABNORMAL LOW (ref 3.87–5.11)
RDW: 15.3 % (ref 11.5–15.5)
WBC: 6.5 10*3/uL (ref 4.0–10.5)
nRBC: 0 % (ref 0.0–0.2)

## 2022-12-25 LAB — BASIC METABOLIC PANEL
Anion gap: 10 (ref 5–15)
BUN: 15 mg/dL (ref 8–23)
CO2: 25 mmol/L (ref 22–32)
Calcium: 8.6 mg/dL — ABNORMAL LOW (ref 8.9–10.3)
Chloride: 102 mmol/L (ref 98–111)
Creatinine, Ser: 0.65 mg/dL (ref 0.44–1.00)
GFR, Estimated: >60 mL/min (ref >60)
Glucose, Bld: 107 mg/dL — ABNORMAL HIGH (ref 70–99)
Potassium: 3.4 mmol/L — ABNORMAL LOW (ref 3.5–5.1)
Sodium: 137 mmol/L (ref 135–145)

## 2022-12-25 NOTE — Telephone Encounter (Signed)
     Patient  visit on 12/19/2022  at [facility]  was for fall  Have you been able to follow up with your primary care physician? Going for surgery on it friday , will be a outpatient procedure  The patient was able to obtain any needed medicine or equipment.  Are there diet recommendations that you are having difficulty following?  Patient expresses understanding of discharge instructions and education provided has no other needs at this time.    [place] [phone] [address] , [place] [address] Email : [email or web address]

## 2022-12-26 NOTE — H&P (Signed)
Outpatient surgery left forearm fracture radius and ulna       Chief Complaint  Patient presents with   Wrist Injury      Left forearm fracture, DOI 12-20-22.           Encounter Diagnoses  Name Primary?   Pre-op exam     Fracture of shaft of ulna and radius, left, closed, initial encounter Yes      This will require ORIF of the radius and ulna   I will discussed the polycythemia vera with Dr. Delton Coombes but should not prohibit [place] from doing surgery     The procedure has been fully reviewed with the patient; The risks and benefits of surgery have been discussed and explained and understood. Alternative treatment has also been reviewed, questions were encouraged and answered. The postoperative plan is also been reviewed.          Chief Complaint  Patient presents with   Wrist Injury      Left forearm fracture but radius and ulna, DOI 12-20-22.    HPI 87 year old female fell on January 5.  She presented to the emergency room at [facility] with pain and deformity of the left upper extremity.  X-rays show both bones forearm fracture left forearm.   She came in today to have her splint evaluated and to be advised on treatment and management   The pain is well-controlled at present.  The splint was bothering her as it was too tight  She denies any neurologic symptoms  ROS: Patient is healthy other than some polycythemia vera has no renal findings on review of systems        Allergies  Allergen Reactions   Codeine        Nausea and vomiting   Diovan [Valsartan]        Rash and itching     Penicillins        Rash and "breaking out in whelps"            Current Outpatient Medications  Medication Instructions   amLODipine (NORVASC) 5 mg, Oral, Daily   aspirin 81 mg, Oral, Daily,     diltiazem (CARDIZEM CD) 180 mg, Oral, Daily   hydroxyurea (HYDREA) 500 MG capsule TAKE ONE CAPSULE BY MOUTH ON MONDAY, TUESDAY, WEDNESDAY, THURSDAY & FRIDAY ONLY.   mometasone  (NASONEX) 50 MCG/ACT nasal spray 2 sprays   traMADol (ULTRAM) 50 mg, Oral, Every 6 hours PRN   Vitamin D, Ergocalciferol, (DRISDOL) 1.25 MG (50000 UNIT) CAPS capsule TAKE 1 CAPSULE BY MOUTH ONCE A WEEK.          BP (!) 154/72   Pulse 73   Ht '5\' 4"'$  (1.626 m)   Wt 117 lb (53.1 kg)   BMI 20.08 kg/m    Body mass index is 20.08 kg/m.   General appearance: Well-developed well-nourished no gross deformities  Cardiovascular normal pulse and perfusion normal color without edema  Neurologically no sensation loss or deficits or pathologic reflexes   Psychological: Awake alert and oriented x3 mood and affect normal   Skin no lacerations or ulcerations no nodularity no palpable masses, no erythema or nodularity   Musculoskeletal:   Left was removed did not see anything untoward.  It was probably just too tight.  New sugar-tong was applied   Neurovascular exam was intact skin was normal.  She can move all her fingers.  She can make a fist.  Radius and ulna are both tender at the fracture sites  the arm was deformed but was reduced well with traction and reversal of deformity            Past Medical History:  Diagnosis Date   Hypertension     Influenza 11/2012   Osteoporosis     Polycythemia     Polycythemia vera(238.4) 02/25/2013    Diagnosed August 2005 on Hydrea 500 mg 3 times a week with excellent control.    Prolapse, uterus, congenital      ring placement           Past Surgical History:  Procedure Laterality Date   APPENDECTOMY       COLONOSCOPY       INCISION AND DRAINAGE Left 01/21/2022    Procedure: INCISION AND DRAINAGE LEFT INDEX FINGER;  Surgeon: Leanora Cover, MD;  Location: [facility];  Service: Orthopedics;  Laterality: Left;   OOPHORECTOMY        left   opperectomy               Family History  Problem Relation Age of Onset   Cancer Brother          throat cancer   Cancer Brother          lung cancer   Cancer Brother          prostate  cancer    Social History        Tobacco Use   Smoking status: Never   Smokeless tobacco: Never  Vaping Use   Vaping Use: Never used  Substance Use Topics   Alcohol use: No   Drug use: No           Allergies  Allergen Reactions   Codeine        Nausea and vomiting   Diovan [Valsartan]        Rash and itching     Penicillins        Rash and "breaking out in whelps"        Active Medications      Current Meds  Medication Sig   amLODipine (NORVASC) 5 MG tablet Take 5 mg by mouth daily.    aspirin 81 MG tablet Take 81 mg by mouth daily.   diltiazem (CARDIZEM CD) 180 MG 24 hr capsule Take 180 mg by mouth daily.    hydroxyurea (HYDREA) 500 MG capsule TAKE ONE CAPSULE BY MOUTH ON MONDAY, TUESDAY, WEDNESDAY, THURSDAY & FRIDAY ONLY.   mometasone (NASONEX) 50 MCG/ACT nasal spray 2 sprays.   traMADol (ULTRAM) 50 MG tablet Take 1 tablet (50 mg total) by mouth every 6 (six) hours as needed.   Vitamin D, Ergocalciferol, (DRISDOL) 1.25 MG (50000 UNIT) CAPS capsule TAKE 1 CAPSULE BY MOUTH ONCE A WEEK.        No orders of the defined types were placed in this encounter.   Arther Abbott, MD  12:14 PM 12/26/2022

## 2022-12-27 ENCOUNTER — Ambulatory Visit (HOSPITAL_BASED_OUTPATIENT_CLINIC_OR_DEPARTMENT_OTHER): Payer: Medicare Other | Admitting: Anesthesiology

## 2022-12-27 ENCOUNTER — Encounter (HOSPITAL_COMMUNITY)
Admission: RE | Disposition: A | Discharge status: Home / Self Care | Payer: Self-pay | Source: Home / Self Care | Attending: Orthopedic Surgery

## 2022-12-27 ENCOUNTER — Ambulatory Visit (HOSPITAL_COMMUNITY)
Admission: RE | Admit: 2022-12-27 | Discharge: 2022-12-27 | Disposition: A | Discharge status: Home / Self Care | Payer: Medicare Other | Attending: Orthopedic Surgery | Admitting: Orthopedic Surgery

## 2022-12-27 ENCOUNTER — Ambulatory Visit (HOSPITAL_COMMUNITY): Payer: Medicare Other | Admitting: Anesthesiology

## 2022-12-27 ENCOUNTER — Encounter (HOSPITAL_COMMUNITY): Payer: Self-pay | Admitting: Orthopedic Surgery

## 2022-12-27 ENCOUNTER — Ambulatory Visit (HOSPITAL_COMMUNITY): Payer: Medicare Other

## 2022-12-27 ENCOUNTER — Other Ambulatory Visit: Payer: Self-pay

## 2022-12-27 DIAGNOSIS — I10 Essential (primary) hypertension: Secondary | ICD-10-CM | POA: Diagnosis not present

## 2022-12-27 DIAGNOSIS — S52302A Unspecified fracture of shaft of left radius, initial encounter for closed fracture: Secondary | ICD-10-CM

## 2022-12-27 DIAGNOSIS — X58XXXA Exposure to other specified factors, initial encounter: Secondary | ICD-10-CM | POA: Diagnosis not present

## 2022-12-27 DIAGNOSIS — S52502A Unspecified fracture of the lower end of left radius, initial encounter for closed fracture: Secondary | ICD-10-CM

## 2022-12-27 DIAGNOSIS — S52202A Unspecified fracture of shaft of left ulna, initial encounter for closed fracture: Secondary | ICD-10-CM

## 2022-12-27 DIAGNOSIS — Z79899 Other long term (current) drug therapy: Secondary | ICD-10-CM | POA: Insufficient documentation

## 2022-12-27 DIAGNOSIS — S52232A Displaced oblique fracture of shaft of left ulna, initial encounter for closed fracture: Secondary | ICD-10-CM | POA: Insufficient documentation

## 2022-12-27 DIAGNOSIS — D45 Polycythemia vera: Secondary | ICD-10-CM | POA: Diagnosis not present

## 2022-12-27 DIAGNOSIS — S5292XD Unspecified fracture of left forearm, subsequent encounter for closed fracture with routine healing: Secondary | ICD-10-CM | POA: Diagnosis not present

## 2022-12-27 DIAGNOSIS — S52202D Unspecified fracture of shaft of left ulna, subsequent encounter for closed fracture with routine healing: Secondary | ICD-10-CM

## 2022-12-27 DIAGNOSIS — Z96642 Presence of left artificial hip joint: Secondary | ICD-10-CM

## 2022-12-27 HISTORY — PX: ORIF RADIAL FRACTURE: SHX5113

## 2022-12-27 SURGERY — OPEN REDUCTION INTERNAL FIXATION (ORIF) RADIAL FRACTURE
Anesthesia: General | Site: Arm Lower | Laterality: Left

## 2022-12-27 MED ORDER — PROPOFOL 10 MG/ML IV BOLUS
INTRAVENOUS | Status: DC | PRN
  Administered 2022-12-27: 130 mg via INTRAVENOUS

## 2022-12-27 MED ORDER — LIDOCAINE HCL (CARDIAC) PF 50 MG/5ML IV SOSY
PREFILLED_SYRINGE | INTRAVENOUS | Status: DC | PRN
  Administered 2022-12-27: 60 mg via INTRAVENOUS

## 2022-12-27 MED ORDER — VANCOMYCIN HCL IN DEXTROSE 1-5 GM/200ML-% IV SOLN
1000.0000 mg | INTRAVENOUS | Status: AC
  Administered 2022-12-27: 1000 mg via INTRAVENOUS
  Filled 2022-12-27: qty 200

## 2022-12-27 MED ORDER — FENTANYL CITRATE (PF) 100 MCG/2ML IJ SOLN
INTRAMUSCULAR | Status: AC
  Filled 2022-12-27: qty 2

## 2022-12-27 MED ORDER — CHLORHEXIDINE GLUCONATE 0.12 % MT SOLN
15.0000 mL | Freq: Once | OROMUCOSAL | Status: AC
  Administered 2022-12-27: 15 mL via OROMUCOSAL

## 2022-12-27 MED ORDER — ONDANSETRON HCL 4 MG/2ML IJ SOLN
INTRAMUSCULAR | Status: AC
  Filled 2022-12-27: qty 2

## 2022-12-27 MED ORDER — ORAL CARE MOUTH RINSE: 15.0000 mL | Freq: Once | OROMUCOSAL | Status: AC

## 2022-12-27 MED ORDER — EPHEDRINE 5 MG/ML INJ
INTRAVENOUS | Status: AC
  Filled 2022-12-27: qty 5

## 2022-12-27 MED ORDER — LACTATED RINGERS IV SOLN: INTRAVENOUS | Status: DC

## 2022-12-27 MED ORDER — ONDANSETRON HCL 4 MG/2ML IJ SOLN
INTRAMUSCULAR | Status: DC | PRN
  Administered 2022-12-27: 4 mg via INTRAVENOUS

## 2022-12-27 MED ORDER — BUPIVACAINE-EPINEPHRINE (PF) 0.5% -1:200000 IJ SOLN
INTRAMUSCULAR | Status: DC | PRN
  Administered 2022-12-27: 30 mL via PERINEURAL

## 2022-12-27 MED ORDER — EPHEDRINE SULFATE (PRESSORS) 50 MG/ML IJ SOLN
INTRAMUSCULAR | Status: DC | PRN
  Administered 2022-12-27 (×2): 5 mg via INTRAVENOUS

## 2022-12-27 MED ORDER — BUPIVACAINE-EPINEPHRINE (PF) 0.5% -1:200000 IJ SOLN
INTRAMUSCULAR | Status: AC
  Filled 2022-12-27: qty 30

## 2022-12-27 MED ORDER — HYDROMORPHONE HCL 1 MG/ML IJ SOLN: 0.2500 mg | INTRAMUSCULAR | Status: DC | PRN

## 2022-12-27 MED ORDER — FENTANYL CITRATE (PF) 100 MCG/2ML IJ SOLN
INTRAMUSCULAR | Status: DC | PRN
  Administered 2022-12-27 (×8): 25 ug via INTRAVENOUS

## 2022-12-27 MED ORDER — LIDOCAINE HCL (PF) 2 % IJ SOLN
INTRAMUSCULAR | Status: AC
  Filled 2022-12-27: qty 5

## 2022-12-27 MED ORDER — SEVOFLURANE IN SOLN
RESPIRATORY_TRACT | Status: AC
  Filled 2022-12-27: qty 250

## 2022-12-27 MED ORDER — OXYCODONE HCL 5 MG PO TABS: 5.0000 mg | ORAL_TABLET | ORAL | 0 refills | Status: AC | PRN

## 2022-12-27 MED ORDER — SODIUM CHLORIDE 0.9 % IR SOLN
Status: DC | PRN
  Administered 2022-12-27 (×2): 1000 mL

## 2022-12-27 MED ORDER — ONDANSETRON HCL 4 MG/2ML IJ SOLN: 4.0000 mg | Freq: Once | INTRAMUSCULAR | Status: DC | PRN

## 2022-12-27 MED ORDER — TRAMADOL HCL 50 MG PO TABS: 50.0000 mg | ORAL_TABLET | Freq: Four times a day (QID) | ORAL | 0 refills | Status: DC | PRN

## 2022-12-27 SURGICAL SUPPLY — 64 items
APL PRP STRL LF DISP 70% ISPRP (MISCELLANEOUS) ×1
BANDAGE ESMARK 4X12 BL STRL LF (DISPOSABLE) ×2 IMPLANT
BIT DRILL 2 CANN GRADUATED (BIT) IMPLANT
BIT DRILL 2.5 CANN LNG (BIT) IMPLANT
BIT DRILL 2.5 CANN STRL (BIT) IMPLANT
BIT DRILL 3 CANN ENDOSCOPIC (BIT) IMPLANT
BLADE SURG SZ10 CARB STEEL (BLADE) ×2 IMPLANT
BNDG CMPR 12X4 ELC STRL LF (DISPOSABLE) ×1
BNDG COHESIVE 4X5 TAN STRL (GAUZE/BANDAGES/DRESSINGS) ×2 IMPLANT
BNDG ELASTIC 4X5.8 VLCR STR LF (GAUZE/BANDAGES/DRESSINGS) IMPLANT
BNDG ESMARK 4X12 BLUE STRL LF (DISPOSABLE) ×1
CHLORAPREP W/TINT 26 (MISCELLANEOUS) ×2 IMPLANT
CLOTH BEACON ORANGE TIMEOUT ST (SAFETY) ×2 IMPLANT
COVER LIGHT HANDLE STERIS (MISCELLANEOUS) ×4 IMPLANT
CUFF TOURN SGL QUICK 18X4 (TOURNIQUET CUFF) ×2 IMPLANT
DRAPE C-ARM FOLDED MOBILE STRL (DRAPES) ×2 IMPLANT
ELECT REM PT RETURN 9FT ADLT (ELECTROSURGICAL) ×1
ELECTRODE REM PT RTRN 9FT ADLT (ELECTROSURGICAL) ×2 IMPLANT
GAUZE SPONGE 4X4 12PLY STRL (GAUZE/BANDAGES/DRESSINGS) ×2 IMPLANT
GAUZE XEROFORM 1X8 LF (GAUZE/BANDAGES/DRESSINGS) IMPLANT
GLOVE BIO SURGEON STRL SZ7 (GLOVE) IMPLANT
GLOVE BIOGEL PI IND STRL 7.0 (GLOVE) ×4 IMPLANT
GLOVE BIOGEL PI IND STRL 7.5 (GLOVE) IMPLANT
GLOVE SS N UNI LF 8.5 STRL (GLOVE) ×2 IMPLANT
GLOVE SURG POLYISO LF SZ8 (GLOVE) ×2 IMPLANT
GOWN STRL REUS W/TWL LRG LVL3 (GOWN DISPOSABLE) ×4 IMPLANT
GOWN STRL REUS W/TWL XL LVL3 (GOWN DISPOSABLE) ×2 IMPLANT
GUIDEWIRE 1.6 (WIRE) ×1
GUIDEWIRE ORTH 157X1.6XTROC (WIRE) IMPLANT
K-WIRE BB-TAK (WIRE) ×1
KIT TURNOVER KIT A (KITS) ×2 IMPLANT
KWIRE BB-TAK (WIRE) IMPLANT
MANIFOLD NEPTUNE II (INSTRUMENTS) ×2 IMPLANT
NDL HYPO 21X1.5 SAFETY (NEEDLE) ×2 IMPLANT
NEEDLE HYPO 21X1.5 SAFETY (NEEDLE) ×1 IMPLANT
NS IRRIG 1000ML POUR BTL (IV SOLUTION) ×2 IMPLANT
PACK BASIC LIMB (CUSTOM PROCEDURE TRAY) ×2 IMPLANT
PAD ARMBOARD 7.5X6 YLW CONV (MISCELLANEOUS) ×2 IMPLANT
PAD CAST 4YDX4 CTTN HI CHSV (CAST SUPPLIES) ×2 IMPLANT
PADDING CAST COTTON 4X4 STRL (CAST SUPPLIES) ×1
PLATE COMPR LOCK 3.5 10H (Plate) IMPLANT
PLATE COMPRESSION 7 HOLE (Plate) IMPLANT
SCREW BONE KREULOCK 3.5X14 TI (Screw) IMPLANT
SCREW CANC TI FT ANKLE 4X14 (Screw) IMPLANT
SCREW CANC TI FT ANKLE 4X18 (Screw) IMPLANT
SCREW CANN LP 4X16 (Screw) IMPLANT
SCREW CORT TI FT ANKLE 3.5X20 (Screw) IMPLANT
SCREW CORTICAL LP 3.0X12MM (Screw) IMPLANT
SCREW CORTICAL LP 3.0X14MM (Screw) IMPLANT
SCREW LO-PRO TI 3.5X16MM (Screw) IMPLANT
SCREW LOCK 16X3.5XNS LF TI (Screw) IMPLANT
SCREW LOCKING 3.5X16 (Screw) ×3 IMPLANT
SCREW LP TI 3.5X14MM (Screw) IMPLANT
SET BASIN LINEN APH (SET/KITS/TRAYS/PACK) ×2 IMPLANT
SPLINT FIBERGLASS 4X30 (CAST SUPPLIES) IMPLANT
SPONGE T-LAP 18X18 ~~LOC~~+RFID (SPONGE) IMPLANT
STAPLER VISISTAT 35W (STAPLE) IMPLANT
SUT MON AB 2-0 SH 27 (SUTURE) ×1
SUT MON AB 2-0 SH27 (SUTURE) IMPLANT
SUT VIC AB 1 CT1 27 (SUTURE) ×2
SUT VIC AB 1 CT1 27XBRD ANTBC (SUTURE) IMPLANT
SYR BULB IRRIG 60ML STRL (SYRINGE) IMPLANT
SYR CONTROL 10ML LL (SYRINGE) ×2 IMPLANT
WATER STERILE IRR 1000ML POUR (IV SOLUTION) ×2 IMPLANT

## 2022-12-27 NOTE — Brief Op Note (Signed)
12/27/2022  3:20 PM  PATIENT:  [name]  87 y.o. female  PRE-OPERATIVE DIAGNOSIS:  Left forearm fracture  POST-OPERATIVE DIAGNOSIS:  Left forearm fracture  PROCEDURE:  Procedure(s): OPEN REDUCTION INTERNAL FIXATION (ORIF) RADIAL and ULNAR FRACTURE (Left)  SURGEON:  Surgeon(s) and Role:    Carole Civil, MD - Primary  PHYSICIAN ASSISTANT:   ASSISTANTS: [name]    ANESTHESIA:   general  EBL:  15 mL   BLOOD ADMINISTERED:none  DRAINS: none   LOCAL MEDICATIONS USED:  MARCAINE     SPECIMEN:  No Specimen  DISPOSITION OF SPECIMEN:  N/A  COUNTS:  YES  TOURNIQUET:   Total Tourniquet Time Documented: Upper Arm (Left) - 126 minutes Total: Upper Arm (Left) - 126 minutes   DICTATION: .Viviann Spare Dictation  PLAN OF CARE: Discharge to home after PACU  PATIENT DISPOSITION:  PACU - hemodynamically stable.   Delay start of Pharmacological VTE agent (>24hrs) due to surgical blood loss or risk of bleeding: not applicable

## 2022-12-27 NOTE — Op Note (Signed)
Orthopaedic Surgery Operative Note (CSN: [id number])  [name]  Jul 04, 1936 Date of Surgery: 12/27/2022   Diagnoses:  Left forearm fracture radius and ulna   Procedure: Open reduction internal fixation radius and ulna left forearm   Operative Finding Successful completion of the planned procedure.     Post-Op Diagnosis: Same Surgeons:Primary: Carole Civil, MD Assistants: Vevelyn Royals Location: [facility] OR ROOM 4 Anesthesia: General Antibiotics: Vancomycin Tourniquet time:  Total Tourniquet Time Documented: Upper Arm (Left) - 126 minutes Total: Upper Arm (Left) - 126 minutes  Estimated Blood Loss: 15 Complications: None Specimens: None Implants: Implant Name Type Inv. Item Serial No. Manufacturer Lot No. LRB No. Used Action  SCREW CORTICAL LP 3.0X12MM - [id number] Screw SCREW CORTICAL LP 3.0X12MM  [employer] STERILE ON SET Left 1 Implanted  SCREW CORTICAL LP 3.0X14MM - [id number] Screw SCREW CORTICAL LP 3.0X14MM  [employer] STERILE ON SET Left 2 Implanted  SCREW LP TI 3.5X14MM - [id number] Screw SCREW LP TI 3.5X14MM  [employer] STERILE ON SET Left 1 Implanted  PLATE COMPRESSION 7 HOLE - [id number] Plate PLATE COMPRESSION 7 HOLE  [employer] STERILE ON SET Left 1 Implanted  SCREW LOCKING 3.5X16 - [id number] Screw SCREW LOCKING 3.5X16  [employer] STERILE ON SET Left 3 Implanted  SCREW CANN LP 4X16 - [id number] Screw SCREW CANN LP 4X16  [employer] STERILE ON SET Left 2 Implanted  SCREW CANC TI FT ANKLE 4X14 - [id number] Screw SCREW CANC TI FT ANKLE 4X14  [employer] STER[employer] ON SET Left 1 Implanted  10 Hole Compression Plate    [employer] STER[employer] ON SET Left 1 Implanted  SCREW CORT TI FT ANKLE 3.5X20 - [id number] Screw SCREW CORT TI FT ANKLE 3.5X20  [employer] STER[employer] ON SET Left 1 Implanted  SCREW LO-PRO TI 3.5X16MM - [id number] Screw SCREW LO-PRO TI 3.5X16MM  [employer] STER[employer] ON SET Left 4 Implanted  SCREW CANC TI FT ANKLE 4X18 - [id number] Screw SCREW CANC TI FT  ANKLE 4X18  [employer] STER[employer] ON SET Left 1 Implanted  3.5 x 14 mm Locking Screw    [employer] STERILE ON SET Left 1 Implanted    Indications for Surgery:   [name] is a 87 y.o. female who fell at home while out in the yard and injured her left forearm sustaining a both bones forearm fracture of the ulna and radius of the left upper extremity.  Benefits and risks of operative and nonoperative management were discussed prior to surgery with patient/guardian(s) and informed consent form was completed.  Detailed discussion was done regarding the posterior interosseous nerve, specific risks including infection, need for additional surgery,    Procedure:   The patient was identified properly. Informed consent was obtained and the surgical site was marked. The patient was taken to the OR where general anesthesia was induced.  The patient was positioned supine with an armboard.  The left upper extremity was prepped and draped in the usual sterile fashion.  Timeout was performed before the beginning of the case.  Tourniquet was used for the above duration.  125 minutes at 250 mmHg  The procedure was done as follows  I first approached the ulna.  I made a incision between the FCU and ECU and divided the tissue down to bone I did a subperiosteal dissection and found a oblique fracture.  I was able to reduce the fracture and held it with bone clamps  I then took these bone clamps off and  approach the radius  We used a volar approach extended in the standard manner  Subcutaneous tissue was divided.  The interval between the brachioradialis and FCR were found proximally and then extended distally detaching the thumb musculature subperiosteally  The pronator was detach by pronating the forearm and removing it subperiosteally  A large butterfly fragment and comminution were found at the fracture site  This was irrigated and debrided  The incision was then extended proximally staying lateral  to the biceps finally the supinator muscle supinating the forearm and then detaching the supinator.  The posterior osseous nerve was found in the substance of the supinator muscle track proximally and distally and held and protected  Reduction required use of a K wire to place the large butterfly fragment back to the proximal fragment and then traction and supination were used to reduce the fracture to length  I planned on using a bridge plate technique in this region with rigid fixation on the ulnar  I then turned my attention back to the ulna regained my reduction and placed the plate in the following manner  I used the C arm to assist with plate placement and fracture reduction.  I attempted to place a lag screw.  The lag screw did not take the bone was too soft.  Therefore used a 7 hole plate and continued with a combination of nonlocking first then locking screws and cancellous screws until I was able to obtain good fixation.  I was careful to place the first screw at the oblong angle of the fracture resident attached to the bone.  I eventually placed a lag screw through the plate once the bone was reduced.    I then turned my attention back to the radius and was able to regain reduction I used a 10 hole plate with 3 holes filled proximal 4 holes distal and 3 empty holes at the fracture site.  I gently placed the bone under the plate that was comminuted  Again we used a combination of cortical screws locking screws and cancellous screws  Imaging was obtained to confirm the reductions and hardware position  I checked for rotation and flexion.  As I pronated and supinated the forearm there was a click at the distal radial ulnar area.  I reduced the soft tissue around the ulnar head by supinating the arm and then used #1 Vicryl to repair the split in the soft tissue.  I then checked for rotation and there was no further clicking  I used 2-0 Monocryl on both wounds to close the wound and then  staples.  I only put Marcaine on the ulnar side to be able to assess neurovascular function of the volar musculature.    We irrigated the wound copiously.  We closed the incision in a multilayer fashion with absorbable suture.  Sterile dressing was placed.  Patient was awoken taken to PACU in stable condition.   Post-operative plan:   Discharge home from the PACU once they have recovered   Pain control with PRN pain medication preferring oral medicines.   Follow up plan will be scheduled in approximately 7 days for incision check and XR.

## 2022-12-27 NOTE — Anesthesia Procedure Notes (Signed)
Procedure Name: LMA Insertion Date/Time: 12/27/2022 12:42 PM  Performed by: Ollen Bowl, CRNAPre-anesthesia Checklist: Patient identified, Patient being monitored, Emergency Drugs available, Timeout performed and Suction available Patient Re-evaluated:Patient Re-evaluated prior to induction Oxygen Delivery Method: Circle System Utilized Preoxygenation: Pre-oxygenation with 100% oxygen Induction Type: IV induction Ventilation: Mask ventilation without difficulty LMA: LMA inserted LMA Size: 3.0 Number of attempts: 1 Placement Confirmation: positive ETCO2 and breath sounds checked- equal and bilateral

## 2022-12-27 NOTE — Transfer of Care (Signed)
Immediate Anesthesia Transfer of Care Note  Patient: [name]  Procedure(s) Performed: OPEN REDUCTION INTERNAL FIXATION (ORIF) RADIAL and ULNAR FRACTURE (Left: Arm Lower)  Patient Location: PACU  Anesthesia Type:General  Level of Consciousness: awake  Airway & Oxygen Therapy: Patient Spontanous Breathing  Post-op Assessment: Report given to RN  Post vital signs: Reviewed  Last Vitals:  Vitals Value Taken Time  BP 106/45 12/27/22 1515  Temp    Pulse 71 12/27/22 1522  Resp 13 12/27/22 1522  SpO2 94 % 12/27/22 1522  Vitals shown include unvalidated device data.  Last Pain:  Vitals:   12/27/22 1047  TempSrc: Oral  PainSc: 0-No pain         Complications: No notable events documented.

## 2022-12-27 NOTE — Interval H&P Note (Signed)
History and Physical Interval Note:  12/27/2022 12:13 PM  [name]  has presented today for surgery, with the diagnosis of Left forearm fracture.  The various methods of treatment have been discussed with the patient and family. After consideration of risks, benefits and other options for treatment, the patient has consented to  Procedure(s): OPEN REDUCTION INTERNAL FIXATION (ORIF) RADIAL and ULNAR FRACTURE (Left) as a surgical intervention.  The patient's history has been reviewed, patient examined, no change in status, stable for surgery.  I have reviewed the patient's chart and labs.  Questions were answered to the patient's satisfaction.     Arther Abbott

## 2022-12-27 NOTE — Anesthesia Preprocedure Evaluation (Signed)
Anesthesia Evaluation  Patient identified by MRN, date of birth, ID band Patient awake    Reviewed: Allergy & Precautions, NPO status , Patient's Chart, lab work & pertinent test results  History of Anesthesia Complications Negative for: history of anesthetic complications  Airway Mallampati: I  TM Distance: >3 FB Neck ROM: Full    Dental  (+) Edentulous Upper, Dental Advisory Given   Pulmonary neg pulmonary ROS   Pulmonary exam normal        Cardiovascular hypertension, Pt. on medications Normal cardiovascular exam     Neuro/Psych negative neurological ROS     GI/Hepatic negative GI ROS, Neg liver ROS,,,  Endo/Other  negative endocrine ROS    Renal/GU negative Renal ROS     Musculoskeletal negative musculoskeletal ROS (+)    Abdominal   Peds  Hematology Polycythemia vera   Anesthesia Other Findings   Reproductive/Obstetrics                             Anesthesia Physical Anesthesia Plan  ASA: 2  Anesthesia Plan: General   Post-op Pain Management: Dilaudid IV   Induction: Intravenous  PONV Risk Score and Plan: Ondansetron and Dexamethasone  Airway Management Planned: LMA  Additional Equipment:   Intra-op Plan:   Post-operative Plan: Extubation in OR  Informed Consent: I have reviewed the patients History and Physical, chart, labs and discussed the procedure including the risks, benefits and alternatives for the proposed anesthesia with the patient or authorized representative who has indicated his/her understanding and acceptance.       Plan Discussed with: CRNA  Anesthesia Plan Comments:         Anesthesia Quick Evaluation

## 2022-12-27 NOTE — Anesthesia Postprocedure Evaluation (Signed)
Anesthesia Post Note  Patient: [name]  Procedure(s) Performed: OPEN REDUCTION INTERNAL FIXATION (ORIF) RADIAL and ULNAR FRACTURE (Left: Arm Lower)  Patient location during evaluation: PACU Anesthesia Type: General Level of consciousness: awake and alert Pain management: pain level controlled Vital Signs Assessment: post-procedure vital signs reviewed and stable Respiratory status: spontaneous breathing, nonlabored ventilation, respiratory function stable and patient connected to nasal cannula oxygen Cardiovascular status: blood pressure returned to baseline and stable Postop Assessment: no apparent nausea or vomiting Anesthetic complications: no   There were no known notable events for this encounter.   Last Vitals:  Vitals:   12/27/22 1047 12/27/22 1515  BP:  (!) 106/45  Pulse: 60 70  Resp: 16 13  Temp: 36.8 C 37 C  SpO2: 98% 94%    Last Pain:  Vitals:   12/27/22 1515  TempSrc:   PainSc: Asleep                 Trixie Rude

## 2023-01-01 ENCOUNTER — Ambulatory Visit: Payer: Medicare Other | Admitting: Orthopedic Surgery

## 2023-01-03 ENCOUNTER — Ambulatory Visit (INDEPENDENT_AMBULATORY_CARE_PROVIDER_SITE_OTHER): Payer: Medicare Other

## 2023-01-03 ENCOUNTER — Ambulatory Visit (INDEPENDENT_AMBULATORY_CARE_PROVIDER_SITE_OTHER): Payer: Medicare Other | Admitting: Orthopedic Surgery

## 2023-01-03 ENCOUNTER — Encounter (HOSPITAL_COMMUNITY): Payer: Self-pay | Admitting: Orthopedic Surgery

## 2023-01-03 DIAGNOSIS — S52202D Unspecified fracture of shaft of left ulna, subsequent encounter for closed fracture with routine healing: Secondary | ICD-10-CM

## 2023-01-03 DIAGNOSIS — S52302D Unspecified fracture of shaft of left radius, subsequent encounter for closed fracture with routine healing: Secondary | ICD-10-CM

## 2023-01-03 NOTE — Progress Notes (Signed)
POST OP VISIT   Encounter Diagnosis  Name Primary?   Closed fracture of radius and ulna, shaft, left, with routine healing, subsequent encounter ORIF 12/27/22 Yes    Procedure ORIF BBFA 12/27/22  POD  # 7  The wounds look great the x-ray looks good I placed in a new sugar-tong follow-up in a week x-rays out of plaster and staples and then long-arm cast for 2 weeks

## 2023-01-03 NOTE — Addendum Note (Signed)
Addendum  created 01/03/23 1327 by Ollen Bowl, CRNA   Intraprocedure Event deleted, Intraprocedure Event edited

## 2023-01-10 ENCOUNTER — Ambulatory Visit (INDEPENDENT_AMBULATORY_CARE_PROVIDER_SITE_OTHER): Payer: Medicare Other | Admitting: Orthopedic Surgery

## 2023-01-10 ENCOUNTER — Ambulatory Visit (INDEPENDENT_AMBULATORY_CARE_PROVIDER_SITE_OTHER): Payer: Medicare Other

## 2023-01-10 ENCOUNTER — Encounter: Payer: Self-pay | Admitting: Orthopedic Surgery

## 2023-01-10 DIAGNOSIS — S52202D Unspecified fracture of shaft of left ulna, subsequent encounter for closed fracture with routine healing: Secondary | ICD-10-CM

## 2023-01-10 DIAGNOSIS — S52302D Unspecified fracture of shaft of left radius, subsequent encounter for closed fracture with routine healing: Secondary | ICD-10-CM | POA: Diagnosis not present

## 2023-01-10 NOTE — Progress Notes (Signed)
dg 

## 2023-01-10 NOTE — Progress Notes (Signed)
Chief Complaint  Patient presents with   Post-op Follow-up    Left forearm 12/27/22    14 DAYS  post op   Status post ORIF left forearm radius and ulna  Patient is doing well wounds look good staples extracted x-rays look good placed back into a sugar-tong.  Posterior osseous nerve is working well she has some numbness in the small finger otherwise everything else looks normal  Sugar-tong splint x-rays in 2 weeks and then a regular brace or cast

## 2023-01-23 ENCOUNTER — Ambulatory Visit (INDEPENDENT_AMBULATORY_CARE_PROVIDER_SITE_OTHER): Payer: Medicare Other

## 2023-01-23 ENCOUNTER — Ambulatory Visit (INDEPENDENT_AMBULATORY_CARE_PROVIDER_SITE_OTHER): Payer: Medicare Other | Admitting: Orthopedic Surgery

## 2023-01-23 DIAGNOSIS — S52302D Unspecified fracture of shaft of left radius, subsequent encounter for closed fracture with routine healing: Secondary | ICD-10-CM

## 2023-01-23 DIAGNOSIS — S52202D Unspecified fracture of shaft of left ulna, subsequent encounter for closed fracture with routine healing: Secondary | ICD-10-CM

## 2023-01-23 NOTE — Progress Notes (Signed)
Chief Complaint  Patient presents with   Post-op Follow-up    Left forearm fracture 12/27/22    27 days status post ORIF both bone forearm fracture  So far so good for [facility]  Her x-ray looks great  Her arm looks good she has a little bit of thumb irritation from the splinting  Everything looks so good unguinal place her in a forearm brace for the next 4 weeks and then take an x-ray.  She is still to avoid any heavy lifting.

## 2023-02-03 ENCOUNTER — Other Ambulatory Visit: Payer: Self-pay | Admitting: Hematology

## 2023-02-03 ENCOUNTER — Other Ambulatory Visit: Payer: Self-pay | Admitting: *Deleted

## 2023-02-03 DIAGNOSIS — D45 Polycythemia vera: Secondary | ICD-10-CM

## 2023-02-03 NOTE — Telephone Encounter (Signed)
Refill approved for Hydrea.  Patient is tolerating and is to continue therapy.

## 2023-02-07 ENCOUNTER — Ambulatory Visit: Payer: Medicare Other | Admitting: Obstetrics & Gynecology

## 2023-02-13 ENCOUNTER — Encounter: Payer: Self-pay | Admitting: Radiology

## 2023-02-17 ENCOUNTER — Ambulatory Visit: Payer: Medicare Other | Admitting: Obstetrics & Gynecology

## 2023-02-20 ENCOUNTER — Ambulatory Visit: Payer: Medicare Other | Admitting: Orthopedic Surgery

## 2023-02-20 ENCOUNTER — Ambulatory Visit: Payer: Medicare Other | Admitting: Orthopaedic Surgery

## 2023-02-24 ENCOUNTER — Encounter: Payer: Self-pay | Admitting: Orthopedic Surgery

## 2023-02-24 ENCOUNTER — Ambulatory Visit (INDEPENDENT_AMBULATORY_CARE_PROVIDER_SITE_OTHER): Payer: Medicare Other

## 2023-02-24 ENCOUNTER — Ambulatory Visit (INDEPENDENT_AMBULATORY_CARE_PROVIDER_SITE_OTHER): Payer: Medicare Other | Admitting: Orthopedic Surgery

## 2023-02-24 DIAGNOSIS — S52202D Unspecified fracture of shaft of left ulna, subsequent encounter for closed fracture with routine healing: Secondary | ICD-10-CM

## 2023-02-24 DIAGNOSIS — S52302D Unspecified fracture of shaft of left radius, subsequent encounter for closed fracture with routine healing: Secondary | ICD-10-CM

## 2023-02-24 NOTE — Patient Instructions (Signed)
Physical therapy has been ordered for you at [facility] They should call you to schedule, [phone]  is the phone number to call, if you want to call to schedule.   

## 2023-02-24 NOTE — Progress Notes (Signed)
Chief Complaint  Patient presents with   Post-op Follow-up    ORIF left forearm fracture    Encounter Diagnosis  Name Primary?   Closed fracture of radius and ulna, shaft, left, with routine healing, subsequent encounter Yes   Surgery date December 27, 2022  [name] is 8 weeks postop ORIF left both bones forearm fracture.  She is doing well.  Her wounds healed nicely  She has regained the ability to make a full fist although she cannot fully pronate she can supinate fully  X-rays show that the fractures are healing nicely the plates are in good position there is no sign of loosening  Recommend OT to try to improve her overall range of motion especially the pronation  Follow-up in 6 to 8 weeks

## 2023-03-05 DIAGNOSIS — M62532 Muscle wasting and atrophy, not elsewhere classified, left forearm: Secondary | ICD-10-CM | POA: Diagnosis not present

## 2023-03-05 DIAGNOSIS — M25532 Pain in left wrist: Secondary | ICD-10-CM | POA: Diagnosis not present

## 2023-03-05 DIAGNOSIS — M25632 Stiffness of left wrist, not elsewhere classified: Secondary | ICD-10-CM | POA: Diagnosis not present

## 2023-03-05 DIAGNOSIS — M62542 Muscle wasting and atrophy, not elsewhere classified, left hand: Secondary | ICD-10-CM | POA: Diagnosis not present

## 2023-03-12 DIAGNOSIS — M62532 Muscle wasting and atrophy, not elsewhere classified, left forearm: Secondary | ICD-10-CM | POA: Diagnosis not present

## 2023-03-12 DIAGNOSIS — M62542 Muscle wasting and atrophy, not elsewhere classified, left hand: Secondary | ICD-10-CM | POA: Diagnosis not present

## 2023-03-12 DIAGNOSIS — M25532 Pain in left wrist: Secondary | ICD-10-CM | POA: Diagnosis not present

## 2023-03-12 DIAGNOSIS — M25632 Stiffness of left wrist, not elsewhere classified: Secondary | ICD-10-CM | POA: Diagnosis not present

## 2023-03-18 DIAGNOSIS — M62542 Muscle wasting and atrophy, not elsewhere classified, left hand: Secondary | ICD-10-CM | POA: Diagnosis not present

## 2023-03-18 DIAGNOSIS — M62532 Muscle wasting and atrophy, not elsewhere classified, left forearm: Secondary | ICD-10-CM | POA: Diagnosis not present

## 2023-03-18 DIAGNOSIS — M25532 Pain in left wrist: Secondary | ICD-10-CM | POA: Diagnosis not present

## 2023-03-18 DIAGNOSIS — M25632 Stiffness of left wrist, not elsewhere classified: Secondary | ICD-10-CM | POA: Diagnosis not present

## 2023-03-20 ENCOUNTER — Telehealth: Payer: Self-pay | Admitting: Orthopedic Surgery

## 2023-03-20 DIAGNOSIS — M25532 Pain in left wrist: Secondary | ICD-10-CM | POA: Diagnosis not present

## 2023-03-20 DIAGNOSIS — M62532 Muscle wasting and atrophy, not elsewhere classified, left forearm: Secondary | ICD-10-CM | POA: Diagnosis not present

## 2023-03-20 DIAGNOSIS — M62542 Muscle wasting and atrophy, not elsewhere classified, left hand: Secondary | ICD-10-CM | POA: Diagnosis not present

## 2023-03-20 DIAGNOSIS — M25632 Stiffness of left wrist, not elsewhere classified: Secondary | ICD-10-CM | POA: Diagnosis not present

## 2023-03-20 NOTE — Telephone Encounter (Signed)
Discussed with Dr Aline Brochure, nothing needed at this time, he will take care of it

## 2023-03-20 NOTE — Telephone Encounter (Signed)
I called patient for more information  States the burning is on top hand palm of hand and in the fingers, entire hand  She has been going to therapy at [facility] She has appointment today with [facility] at 4 It was burning before therapy started feels really tight in the wrist  [place] daughter said she may need medicine ( [place] daughter is a Marine scientist at [employer] told her to tell Dr Aline Brochure, and she may need medicine for the burning)

## 2023-03-20 NOTE — Telephone Encounter (Signed)
Dr. Ruthe Mannan pt - spoke w/the patient, stated she is having burning in her left wrist, she stated it's coming from the nerve work Dr. Aline Brochure did.  She is wanting something for this.  She stated a nurse at the hospital told her that was something Dr. Aline Brochure could prescribe for this.  Pts uses [facility].

## 2023-03-21 ENCOUNTER — Other Ambulatory Visit: Payer: Self-pay | Admitting: Orthopedic Surgery

## 2023-03-21 DIAGNOSIS — S52202D Unspecified fracture of shaft of left ulna, subsequent encounter for closed fracture with routine healing: Secondary | ICD-10-CM

## 2023-03-21 MED ORDER — GABAPENTIN 100 MG PO CAPS: 100.0000 mg | ORAL_CAPSULE | Freq: Every day | ORAL | 0 refills | Status: DC

## 2023-03-21 MED ORDER — PREDNISONE 10 MG PO TABS: 10.0000 mg | ORAL_TABLET | Freq: Two times a day (BID) | ORAL | 0 refills | Status: DC

## 2023-03-21 NOTE — Telephone Encounter (Signed)
Pause therapy for 2 weeks and I ll call in some medication

## 2023-03-21 NOTE — Progress Notes (Signed)
Meds ordered this encounter  Medications   predniSONE (DELTASONE) 10 MG tablet    Sig: Take 1 tablet (10 mg total) by mouth 2 (two) times daily with a meal.    Dispense:  42 tablet    Refill:  0   gabapentin (NEURONTIN) 100 MG capsule    Sig: Take 1 capsule (100 mg total) by mouth at bedtime.    Dispense:  42 capsule    Refill:  0

## 2023-03-21 NOTE — Telephone Encounter (Signed)
I called her to advise. Again. Unable to reach or leave a message

## 2023-03-21 NOTE — Telephone Encounter (Signed)
I called her to advise no therapy 2 weeks  Dr Rexene Edison will send meds to [facility] No voice mail no answer

## 2023-03-25 DIAGNOSIS — M62542 Muscle wasting and atrophy, not elsewhere classified, left hand: Secondary | ICD-10-CM | POA: Diagnosis not present

## 2023-03-25 DIAGNOSIS — M62532 Muscle wasting and atrophy, not elsewhere classified, left forearm: Secondary | ICD-10-CM | POA: Diagnosis not present

## 2023-03-25 DIAGNOSIS — M25532 Pain in left wrist: Secondary | ICD-10-CM | POA: Diagnosis not present

## 2023-03-25 DIAGNOSIS — M25632 Stiffness of left wrist, not elsewhere classified: Secondary | ICD-10-CM | POA: Diagnosis not present

## 2023-03-27 DIAGNOSIS — M62542 Muscle wasting and atrophy, not elsewhere classified, left hand: Secondary | ICD-10-CM | POA: Diagnosis not present

## 2023-03-27 DIAGNOSIS — M25532 Pain in left wrist: Secondary | ICD-10-CM | POA: Diagnosis not present

## 2023-03-27 DIAGNOSIS — M25632 Stiffness of left wrist, not elsewhere classified: Secondary | ICD-10-CM | POA: Diagnosis not present

## 2023-03-27 DIAGNOSIS — M62532 Muscle wasting and atrophy, not elsewhere classified, left forearm: Secondary | ICD-10-CM | POA: Diagnosis not present

## 2023-04-01 DIAGNOSIS — M62532 Muscle wasting and atrophy, not elsewhere classified, left forearm: Secondary | ICD-10-CM | POA: Diagnosis not present

## 2023-04-01 DIAGNOSIS — M25532 Pain in left wrist: Secondary | ICD-10-CM | POA: Diagnosis not present

## 2023-04-01 DIAGNOSIS — M62542 Muscle wasting and atrophy, not elsewhere classified, left hand: Secondary | ICD-10-CM | POA: Diagnosis not present

## 2023-04-01 DIAGNOSIS — M25632 Stiffness of left wrist, not elsewhere classified: Secondary | ICD-10-CM | POA: Diagnosis not present

## 2023-04-03 DIAGNOSIS — M25532 Pain in left wrist: Secondary | ICD-10-CM | POA: Diagnosis not present

## 2023-04-03 DIAGNOSIS — M62542 Muscle wasting and atrophy, not elsewhere classified, left hand: Secondary | ICD-10-CM | POA: Diagnosis not present

## 2023-04-03 DIAGNOSIS — M25632 Stiffness of left wrist, not elsewhere classified: Secondary | ICD-10-CM | POA: Diagnosis not present

## 2023-04-03 DIAGNOSIS — M62532 Muscle wasting and atrophy, not elsewhere classified, left forearm: Secondary | ICD-10-CM | POA: Diagnosis not present

## 2023-04-07 ENCOUNTER — Other Ambulatory Visit (INDEPENDENT_AMBULATORY_CARE_PROVIDER_SITE_OTHER): Payer: Medicare Other

## 2023-04-07 ENCOUNTER — Ambulatory Visit (INDEPENDENT_AMBULATORY_CARE_PROVIDER_SITE_OTHER): Payer: Medicare Other | Admitting: Orthopedic Surgery

## 2023-04-07 ENCOUNTER — Encounter: Payer: Self-pay | Admitting: Orthopedic Surgery

## 2023-04-07 DIAGNOSIS — S52202D Unspecified fracture of shaft of left ulna, subsequent encounter for closed fracture with routine healing: Secondary | ICD-10-CM

## 2023-04-07 DIAGNOSIS — S52302D Unspecified fracture of shaft of left radius, subsequent encounter for closed fracture with routine healing: Secondary | ICD-10-CM

## 2023-04-07 NOTE — Progress Notes (Signed)
   Chief Complaint  Patient presents with   Fracture     ORIF left forearm fracture DOS 12/27/22       87 year old female had a both bone forearm fracture on the left.  It was highly comminuted.  We were able to line it up reasonably.  Her main issue is that she cannot fully pronate  I doubt we will get this back.  She also lacks some wrist extension.  She had some numbness and tingling in the ulnar nerve distribution which was relieved by medication with prednisone  Today her volar wounds are intact no signs of infection her x-ray looks good again her wrist extension and pronation are lacking especially the pronation  She has enough shoulder range of motion to accommodate  She can finish therapy on Thursday  The Rene Kocher looks healed in good alignment today she will follow-up as needed with [place]

## 2023-04-15 ENCOUNTER — Inpatient Hospital Stay: Payer: Medicare Other | Attending: Physician Assistant

## 2023-04-15 DIAGNOSIS — D45 Polycythemia vera: Secondary | ICD-10-CM | POA: Diagnosis not present

## 2023-04-15 LAB — CBC WITH DIFFERENTIAL/PLATELET
Abs Immature Granulocytes: 0.01 10*3/uL (ref 0.00–0.07)
Basophils Absolute: 0.1 10*3/uL (ref 0.0–0.1)
Basophils Relative: 1 %
Eosinophils Absolute: 0.1 10*3/uL (ref 0.0–0.5)
Eosinophils Relative: 2 %
HCT: 36.1 % (ref 36.0–46.0)
Hemoglobin: 11.7 g/dL — ABNORMAL LOW (ref 12.0–15.0)
Immature Granulocytes: 0 %
Lymphocytes Relative: 22 %
Lymphs Abs: 0.9 10*3/uL (ref 0.7–4.0)
MCH: 34.6 pg — ABNORMAL HIGH (ref 26.0–34.0)
MCHC: 32.4 g/dL (ref 30.0–36.0)
MCV: 106.8 fL — ABNORMAL HIGH (ref 80.0–100.0)
Monocytes Absolute: 0.3 10*3/uL (ref 0.1–1.0)
Monocytes Relative: 7 %
Neutro Abs: 2.8 10*3/uL (ref 1.7–7.7)
Neutrophils Relative %: 68 %
Platelets: 204 10*3/uL (ref 150–400)
RBC: 3.38 MIL/uL — ABNORMAL LOW (ref 3.87–5.11)
RDW: 17.2 % — ABNORMAL HIGH (ref 11.5–15.5)
WBC: 4.2 10*3/uL (ref 4.0–10.5)
nRBC: 0 % (ref 0.0–0.2)

## 2023-04-15 LAB — COMPREHENSIVE METABOLIC PANEL
ALT: 16 U/L (ref 0–44)
AST: 18 U/L (ref 15–41)
Albumin: 3.7 g/dL (ref 3.5–5.0)
Alkaline Phosphatase: 80 U/L (ref 38–126)
Anion gap: 7 (ref 5–15)
BUN: 19 mg/dL (ref 8–23)
CO2: 27 mmol/L (ref 22–32)
Calcium: 8.5 mg/dL — ABNORMAL LOW (ref 8.9–10.3)
Chloride: 100 mmol/L (ref 98–111)
Creatinine, Ser: 0.67 mg/dL (ref 0.44–1.00)
GFR, Estimated: >60 mL/min (ref >60)
Glucose, Bld: 89 mg/dL (ref 70–99)
Potassium: 3.4 mmol/L — ABNORMAL LOW (ref 3.5–5.1)
Sodium: 134 mmol/L — ABNORMAL LOW (ref 135–145)
Total Bilirubin: 0.9 mg/dL (ref 0.3–1.2)
Total Protein: 6.6 g/dL (ref 6.5–8.1)

## 2023-04-15 LAB — LACTATE DEHYDROGENASE: LDH: 144 U/L (ref 98–192)

## 2023-04-21 NOTE — Progress Notes (Unsigned)
[facility] [address], [place] [address]   CLINIC:  Medical Oncology/Hematology  PCP:  Assunta Found, MD [address] [place] [address] [phone]   REASON FOR VISIT:  Follow-up for JAK2 positive polycythemia vera   PRIOR THERAPY: None  CURRENT THERAPY: Hydrea  INTERVAL HISTORY:   [name] 87 y.o. female returns for routine follow-up of JAK2 positive polycythemia vera.  She was last seen by Rojelio Brenner PA-C on 10/15/2022.   At today's visit, she reports feeling fairly well.  Since her last visit, she fell while trying to pick an air vent at her apartment.  She had ORIF left forearm fracture radius and ulna on 12/27/2022.   She continues to take Hydrea 500 mg Monday through Friday, and is tolerating it well.  She denies any abnormal fatigue, mouth sores, nonhealing skin sores, or GI side effects.  She takes aspirin 81 mg daily.   No history of DVT, PE, MI, or CVA.  She denies any aquagenic pruritus, Raynaud's, erythromelalgia, or other vasomotor symptoms.  She denies any B-symptoms, abdominal pain, nausea, or early satiety.  She has 75% energy and 100% appetite. She endorses that she is maintaining a stable weight.  ASSESSMENT & PLAN:  1.  JAK2 positive polycythemia vera - Initially diagnosed in August 2005.  Reports that she has been on hydroxyurea for 15+ years -- She did not have bone marrow biopsy - Denies any prior history of DVT, PE, MI, or CVA  - Current dose of Hydrea is 500 mg daily Monday through Friday. - She is tolerating hydroxyurea well. - Denies any aquagenic pruritus or vasomotor symptoms. - No recent infections or B symptoms.   - Most recent labs (04/15/2023): Hgb 11.7/MCV 106.8, platelets 204, WBC 4.2.  Normal LDH.  CMP unremarkable. - PLAN: Continue Hydrea 500 mg daily Monday through Friday.  Repeat labs in 6 months with RTC 1 week after.    2.  Vitamin D deficiency - Currently taking vitamin D every other  week.   - Most recent vitamin D (10/08/2022) normal at 75.03 - PLAN: Recheck vitamin D annually (next due October 2024)  PLAN SUMMARY: >> Labs in 6 months = CBC, CMP, LDH, Vitamin D >> RTC 1 week after labs      REVIEW OF SYSTEMS:   Review of Systems  Constitutional:  Negative for appetite change, chills, diaphoresis, fatigue, fever and unexpected weight change.  HENT:   Negative for lump/mass and nosebleeds.   Eyes:  Negative for eye problems.  Respiratory:  Negative for cough, hemoptysis and shortness of breath.   Cardiovascular:  Negative for chest pain, leg swelling and palpitations.  Gastrointestinal:  Negative for abdominal pain, blood in stool, constipation, diarrhea, nausea and vomiting.  Genitourinary:  Negative for hematuria.   Skin: Negative.   Neurological:  Negative for dizziness, headaches and light-headedness.  Hematological:  Does not bruise/bleed easily.     PHYSICAL EXAM:  ECOG PERFORMANCE STATUS: 0 - Asymptomatic  There were no vitals filed for this visit. There were no vitals filed for this visit. Physical Exam Constitutional:      Appearance: Normal appearance. She is normal weight.  Cardiovascular:     Heart sounds: Normal heart sounds.  Pulmonary:     Breath sounds: Normal breath sounds.  Neurological:     General: No focal deficit present.     Mental Status: Mental status is at baseline.  Psychiatric:        Behavior: Behavior  normal. Behavior is cooperative.    PAST MEDICAL/SURGICAL HISTORY:  Past Medical History:  Diagnosis Date   Hypertension    Influenza 11/2012   Osteoporosis    Polycythemia    Polycythemia vera(238.4) 02/25/2013   Diagnosed August 2005 on Hydrea 500 mg 3 times a week with excellent control.    Prolapse, uterus, congenital    ring placement   Past Surgical History:  Procedure Laterality Date   APPENDECTOMY     COLONOSCOPY     INCISION AND DRAINAGE Left 01/21/2022   Procedure: INCISION AND DRAINAGE LEFT INDEX  FINGER;  Surgeon: Betha Loa, MD;  Location: [facility];  Service: Orthopedics;  Laterality: Left;   OOPHORECTOMY     left   opperectomy     ORIF RADIAL FRACTURE Left 12/27/2022   Procedure: OPEN REDUCTION INTERNAL FIXATION (ORIF) RADIAL and ULNAR FRACTURE;  Surgeon: Vickki Hearing, MD;  Location: [facility];  Service: Orthopedics;  Laterality: Left;   REVISION TOTAL HIP ARTHROPLASTY Left     SOCIAL HISTORY:  Social History   Socioeconomic History   Marital status: Divorced    Spouse name: Not on file   Number of children: 1   Years of education: Not on file   Highest education level: Not on file  Occupational History   Not on file  Tobacco Use   Smoking status: Never   Smokeless tobacco: Never  Vaping Use   Vaping Use: Never used  Substance and Sexual Activity   Alcohol use: No   Drug use: No   Sexual activity: Yes    Birth control/protection: Post-menopausal  Other Topics Concern   Not on file  Social History Narrative   Not on file   Social Determinants of Health   Financial Resource Strain: Not on file  Food Insecurity: Not on file  Transportation Needs: Not on file  Physical Activity: Not on file  Stress: Not on file  Social Connections: Not on file  Intimate Partner Violence: Not on file    FAMILY HISTORY:  Family History  Problem Relation Age of Onset   Cancer Brother        throat cancer   Cancer Brother        lung cancer   Cancer Brother        prostate cancer    CURRENT MEDICATIONS:  Outpatient Encounter Medications as of 04/22/2023  Medication Sig   amLODipine (NORVASC) 5 MG tablet Take 5 mg by mouth daily.    aspirin 81 MG tablet Take 81 mg by mouth daily.   diltiazem (CARDIZEM CD) 180 MG 24 hr capsule Take 180 mg by mouth daily.    gabapentin (NEURONTIN) 100 MG capsule Take 1 capsule (100 mg total) by mouth at bedtime.   hydroxyurea (HYDREA) 500 MG capsule TAKE ONE CAPSULE BY MOUTH ON MONDAY, TUESDAY, WEDNESDAY, THURSDAY &  FRIDAY ONLY.   mometasone (NASONEX) 50 MCG/ACT nasal spray Place 2 sprays into the nose daily as needed (allergies).   predniSONE (DELTASONE) 10 MG tablet Take 1 tablet (10 mg total) by mouth 2 (two) times daily with a meal.   traMADol (ULTRAM) 50 MG tablet Take 1 tablet (50 mg total) by mouth every 6 (six) hours as needed.   Vitamin D, Ergocalciferol, (DRISDOL) 1.25 MG (50000 UNIT) CAPS capsule TAKE 1 CAPSULE BY MOUTH ONCE A WEEK. (Patient taking differently: Take 50,000 Units by mouth every 14 (fourteen) days.)   No facility-administered encounter medications on file as of 04/22/2023.  ALLERGIES:  Allergies  Allergen Reactions   Codeine     Nausea and vomiting   Diovan [Valsartan]     Rash and itching    Penicillins     Rash and "breaking out in whelps"     LABORATORY DATA:  I have reviewed the labs as listed.  CBC    Component Value Date/Time   WBC 4.2 04/15/2023 1408   RBC 3.38 (L) 04/15/2023 1408   HGB 11.7 (L) 04/15/2023 1408   HCT 36.1 04/15/2023 1408   PLT 204 04/15/2023 1408   MCV 106.8 (H) 04/15/2023 1408   MCH 34.6 (H) 04/15/2023 1408   MCHC 32.4 04/15/2023 1408   RDW 17.2 (H) 04/15/2023 1408   LYMPHSABS 0.9 04/15/2023 1408   MONOABS 0.3 04/15/2023 1408   EOSABS 0.1 04/15/2023 1408   BASOSABS 0.1 04/15/2023 1408      Latest Ref Rng & Units 04/15/2023    2:08 PM 12/25/2022   10:36 AM 10/08/2022    2:02 PM  CMP  Glucose 70 - 99 mg/dL 89  161  96   BUN 8 - 23 mg/dL 19  15  20    Creatinine 0.44 - 1.00 mg/dL 0.96  0.45  4.09   Sodium 135 - 145 mmol/L 134  137  139   Potassium 3.5 - 5.1 mmol/L 3.4  3.4  4.2   Chloride 98 - 111 mmol/L 100  102  106   CO2 22 - 32 mmol/L 27  25  28    Calcium 8.9 - 10.3 mg/dL 8.5  8.6  8.6   Total Protein 6.5 - 8.1 g/dL 6.6   6.7   Total Bilirubin 0.3 - 1.2 mg/dL 0.9   0.8   Alkaline Phos 38 - 126 U/L 80   77   AST 15 - 41 U/L 18   18   ALT 0 - 44 U/L 16   13     DIAGNOSTIC IMAGING:  I have independently reviewed the  relevant imaging and discussed with the patient.   WRAP UP:  All questions were answered. The patient knows to call the clinic with any problems, questions or concerns.  Medical decision making: Moderate  Time spent on visit: I spent 20 minutes counseling the patient face to face. The total time spent in the appointment was 30 minutes and more than 50% was on counseling.  Briana Guadalajara, PA-C  04/22/23 1:53 PM

## 2023-04-22 ENCOUNTER — Other Ambulatory Visit: Payer: Self-pay

## 2023-04-22 ENCOUNTER — Ambulatory Visit: Payer: Medicare Other | Admitting: Physician Assistant

## 2023-04-22 ENCOUNTER — Inpatient Hospital Stay: Payer: Medicare Other | Attending: Physician Assistant | Admitting: Physician Assistant

## 2023-04-22 VITALS — BP 158/56 | HR 62 | Temp 98.1°F | Resp 18 | Ht 64.0 in | Wt 113.3 lb

## 2023-04-22 DIAGNOSIS — D45 Polycythemia vera: Secondary | ICD-10-CM

## 2023-04-22 DIAGNOSIS — E559 Vitamin D deficiency, unspecified: Secondary | ICD-10-CM

## 2023-04-22 MED ORDER — HYDROXYUREA 500 MG PO CAPS: ORAL_CAPSULE | ORAL | 3 refills | Status: DC

## 2023-04-22 MED ORDER — VITAMIN D (ERGOCALCIFEROL) 1.25 MG (50000 UNIT) PO CAPS: 50000.0000 [IU] | ORAL_CAPSULE | ORAL | 3 refills | Status: DC

## 2023-04-22 NOTE — Patient Instructions (Signed)
[facility] at [facility] **VISIT SUMMARY & IMPORTANT INSTRUCTIONS **   You were seen today by Rojelio Brenner PA-C for your follow up visit.    POLYCYTHEMIA Your blood counts were all within their target range today. Continue to take your Hydrea 1 tablet (500 mg) every day Monday through Friday Take aspirin 81 mg daily.  VITAMIN D DEFICIENCY Your vitamin D levels are normal. Continue to take your vitamin D 50,000 units every other week  LABS: Return in 6 months for repeat labs  FOLLOW-UP APPOINTMENT: Office visit in 6 months, 1 week after labs  ** Thank you for trusting me with your healthcare!  I strive to provide all of my patients with quality care at each visit.  If you receive a survey for this visit, I would be so grateful to you for taking the time to provide feedback.  Thank you in advance!  ~ Daianna Vasques                   Dr. Doreatha Massed   &   Rojelio Brenner, PA-C   - - - - - - - - - - - - - - - - - -    Thank you for choosing [facility] at [facility] to provide your oncology and hematology care.  To afford each patient quality time with our provider, please arrive at least 15 minutes before your scheduled appointment time.   If you have a lab appointment with the Cancer Center please come in thru the Main Entrance and check in at the main information desk.  You need to re-schedule your appointment should you arrive 10 or more minutes late.  We strive to give you quality time with our providers, and arriving late affects you and other patients whose appointments are after yours.  Also, if you no show three or more times for appointments you may be dismissed from the clinic at the providers discretion.     Again, thank you for choosing [facility].  Our hope is that these requests will decrease the amount of time that you wait before being seen by our physicians.        _____________________________________________________________  Should you have questions after your visit to [facility], please contact our office at [phone] and follow the prompts.  Our office hours are 8:00 a.m. and 4:30 p.m. Monday - Friday.  Please note that voicemails left after 4:00 p.m. may not be returned until the following business day.  We are closed weekends and major holidays.  You do have access to a nurse 24-7, just call the main number to the clinic [phone] and do not press any options, hold on the line and a nurse will answer the phone.    For prescription refill requests, have your pharmacy contact our office and allow 72 hours.

## 2023-05-06 DIAGNOSIS — I10 Essential (primary) hypertension: Secondary | ICD-10-CM | POA: Diagnosis not present

## 2023-05-06 DIAGNOSIS — B350 Tinea barbae and tinea capitis: Secondary | ICD-10-CM | POA: Diagnosis not present

## 2023-05-06 DIAGNOSIS — D45 Polycythemia vera: Secondary | ICD-10-CM | POA: Diagnosis not present

## 2023-05-06 DIAGNOSIS — Z681 Body mass index (BMI) 19 or less, adult: Secondary | ICD-10-CM | POA: Diagnosis not present

## 2023-05-06 DIAGNOSIS — Z0001 Encounter for general adult medical examination with abnormal findings: Secondary | ICD-10-CM | POA: Diagnosis not present

## 2023-05-08 ENCOUNTER — Encounter: Payer: Self-pay | Admitting: Obstetrics & Gynecology

## 2023-05-08 ENCOUNTER — Ambulatory Visit (INDEPENDENT_AMBULATORY_CARE_PROVIDER_SITE_OTHER): Payer: Medicare Other | Admitting: Obstetrics & Gynecology

## 2023-05-08 VITALS — BP 144/69 | HR 78 | Ht 64.0 in

## 2023-05-08 DIAGNOSIS — N813 Complete uterovaginal prolapse: Secondary | ICD-10-CM

## 2023-05-08 DIAGNOSIS — Z4689 Encounter for fitting and adjustment of other specified devices: Secondary | ICD-10-CM

## 2023-05-08 NOTE — Progress Notes (Signed)
Chief Complaint  Patient presents with   Pessary Check    Blood pressure (!) 144/69, pulse 78, height 5\' 4"  (1.626 m).  [name] presents today for routine follow up related to her pessary.   She uses a Milex ring with support #2 She reports no vaginal discharge and no vaginal bleeding   Likert scale(1 not bothersome -5 very bothersome)  :  1  Exam reveals no undue vaginal mucosal pressure of breakdown, no discharge and no vaginal bleeding.  Vaginal Epithelial Abnormality Classification System:   0 0    No abnormalities 1    Epithelial erythema 2    Granulation tissue 3    Epithelial break or erosion, 1 cm or less 4    Epithelial break or erosion, 1 cm or greater  The pessary is removed, cleaned and replaced without difficulty.      ICD-10-CM   1. Pessary maintenance, Milex ring with support #2  Z46.89     2. Uterovaginal prolapse, complete  N81.3        [name] will be sen back in 4 months for continued follow up.  Lazaro Arms, MD  05/08/2023 2:35 PM

## 2023-06-16 DIAGNOSIS — L821 Other seborrheic keratosis: Secondary | ICD-10-CM | POA: Diagnosis not present

## 2023-06-16 DIAGNOSIS — C44319 Basal cell carcinoma of skin of other parts of face: Secondary | ICD-10-CM | POA: Diagnosis not present

## 2023-06-28 DIAGNOSIS — Z6821 Body mass index (BMI) 21.0-21.9, adult: Secondary | ICD-10-CM | POA: Diagnosis not present

## 2023-06-28 DIAGNOSIS — M109 Gout, unspecified: Secondary | ICD-10-CM | POA: Diagnosis not present

## 2023-07-01 DIAGNOSIS — Z681 Body mass index (BMI) 19 or less, adult: Secondary | ICD-10-CM | POA: Diagnosis not present

## 2023-07-01 DIAGNOSIS — M1991 Primary osteoarthritis, unspecified site: Secondary | ICD-10-CM | POA: Diagnosis not present

## 2023-07-01 DIAGNOSIS — B351 Tinea unguium: Secondary | ICD-10-CM | POA: Diagnosis not present

## 2023-07-21 ENCOUNTER — Ambulatory Visit (INDEPENDENT_AMBULATORY_CARE_PROVIDER_SITE_OTHER): Payer: Medicare Other | Admitting: Podiatry

## 2023-07-21 ENCOUNTER — Encounter: Payer: Self-pay | Admitting: Podiatry

## 2023-07-21 DIAGNOSIS — B351 Tinea unguium: Secondary | ICD-10-CM

## 2023-07-21 DIAGNOSIS — M79675 Pain in left toe(s): Secondary | ICD-10-CM

## 2023-07-21 DIAGNOSIS — M79674 Pain in right toe(s): Secondary | ICD-10-CM | POA: Diagnosis not present

## 2023-07-21 NOTE — Progress Notes (Signed)
  Subjective:  Patient ID: [name], female    DOB: 26-Sep-1936,   MRN: [id number]  Chief Complaint  Patient presents with   Nail Problem    Wants toenails trimmed and states has nail fungus took lamisil but could not tolerate     87 y.o. female presents for concern of thickened elongated and painful nails that are difficult to trim. Requesting to have them trimmed today. Has been on Lamisil in the past and not tolerated. She is not diabetic. Also relates possible reaction vs gout flare with swelling and pain in the right foot. Relates it has been improving since.   PCP:  Assunta Found, MD    . Denies any other pedal complaints. Denies n/v/f/c.   Past Medical History:  Diagnosis Date   Hypertension    Influenza 11/2012   Osteoporosis    Polycythemia    Polycythemia vera(238.4) 02/25/2013   Diagnosed August 2005 on Hydrea 500 mg 3 times a week with excellent control.    Prolapse, uterus, congenital    ring placement    Objective:  Physical Exam: Vascular: DP/PT pulses 2/4 bilateral. CFT <3 seconds. Normal hair growth on digits. No edema.  Skin. No lacerations or abrasions bilateral feet. Nails 1-5 bilateral thickened elongated and dystrophic with subungual debris  Musculoskeletal: MMT 5/5 bilateral lower extremities in DF, PF, Inversion and Eversion. Deceased ROM in DF of ankle joint.  Neurological: Sensation intact to light touch.   Assessment:   1. Pain due to onychomycosis of toenails of both feet      Plan:  Patient was evaluated and treated and all questions answered. -Mechanically debrided all nails 1-5 bilateral using sterile nail nipper and filed with dremel without incident as courtesy.  -Discussed urea nail gel.  Discussed possible removal of the nail in the future.  -Return for future gout flare as needed  Louann Sjogren, DPM

## 2023-07-28 DIAGNOSIS — Z85828 Personal history of other malignant neoplasm of skin: Secondary | ICD-10-CM | POA: Diagnosis not present

## 2023-07-28 DIAGNOSIS — Z08 Encounter for follow-up examination after completed treatment for malignant neoplasm: Secondary | ICD-10-CM | POA: Diagnosis not present

## 2023-08-19 ENCOUNTER — Telehealth: Payer: Self-pay

## 2023-08-19 NOTE — Telephone Encounter (Signed)
Patient's daughter Prentice Docker stopped by and asked if there was a way that Dr. Romeo Apple could do a letter for insurance purposes to help the patient to get a lift chair. States that patient is having difficulty getting up from a sitting down position since her left arm was fractured.  Please call and advise at [phone]

## 2023-08-19 NOTE — Telephone Encounter (Signed)
For documentation of lift chair need must  Patient  must have severe arthritis of the hip or knee or suffer from muscular dystrophy or other qualifying neuromuscular disease. Doctor must state that the chair is medically necessary to improve your condition or keep it from worsening, and must include it as part of your written treatment plan. Doctor must state that you are completely incapable of standing if you did not have the lift chair. Patient must be able to control the device yourself and use it to stand or sit without other assistance.  If she meets these requirements will you addend last note? If she does not meet requirements let me know and I will call her.

## 2023-08-20 NOTE — Telephone Encounter (Signed)
I called her daughter to advise.

## 2023-08-25 DIAGNOSIS — Z08 Encounter for follow-up examination after completed treatment for malignant neoplasm: Secondary | ICD-10-CM | POA: Diagnosis not present

## 2023-08-25 DIAGNOSIS — Z85828 Personal history of other malignant neoplasm of skin: Secondary | ICD-10-CM | POA: Diagnosis not present

## 2023-09-08 ENCOUNTER — Ambulatory Visit (INDEPENDENT_AMBULATORY_CARE_PROVIDER_SITE_OTHER): Payer: Medicare Other | Admitting: Obstetrics & Gynecology

## 2023-09-08 ENCOUNTER — Encounter: Payer: Self-pay | Admitting: Obstetrics & Gynecology

## 2023-09-08 VITALS — BP 170/73 | HR 69

## 2023-09-08 DIAGNOSIS — N813 Complete uterovaginal prolapse: Secondary | ICD-10-CM

## 2023-09-08 DIAGNOSIS — Z4689 Encounter for fitting and adjustment of other specified devices: Secondary | ICD-10-CM

## 2023-09-08 NOTE — Progress Notes (Signed)
Chief Complaint  Patient presents with   Pessary Check    Blood pressure (!) 170/73, pulse 69.  [name] presents today for routine follow up related to her pessary.   She uses a Milex ring with support #2 She reports no vaginal discharge and no vaginal bleeding   Likert scale(1 not bothersome -5 very bothersome)  :  1  Exam reveals no undue vaginal mucosal pressure of breakdown, no discharge and no vaginal bleeding.  Vaginal Epithelial Abnormality Classification System:   0 0    No abnormalities 1    Epithelial erythema 2    Granulation tissue 3    Epithelial break or erosion, 1 cm or less 4    Epithelial break or erosion, 1 cm or greater  The pessary is removed, cleaned and replaced without difficulty.      ICD-10-CM   1. Pessary maintenance, Milex ring with support #2  Z46.89     2. Uterovaginal prolapse, complete  N81.3        [name] will be sen back in 4 months for continued follow up.  Lazaro Arms, MD  09/08/2023 4:57 PM

## 2023-10-23 ENCOUNTER — Inpatient Hospital Stay: Payer: Medicare Other | Attending: Hematology

## 2023-10-23 DIAGNOSIS — Z801 Family history of malignant neoplasm of trachea, bronchus and lung: Secondary | ICD-10-CM | POA: Insufficient documentation

## 2023-10-23 DIAGNOSIS — E559 Vitamin D deficiency, unspecified: Secondary | ICD-10-CM | POA: Insufficient documentation

## 2023-10-23 DIAGNOSIS — Z8042 Family history of malignant neoplasm of prostate: Secondary | ICD-10-CM | POA: Diagnosis not present

## 2023-10-23 DIAGNOSIS — B351 Tinea unguium: Secondary | ICD-10-CM | POA: Diagnosis not present

## 2023-10-23 DIAGNOSIS — D45 Polycythemia vera: Secondary | ICD-10-CM | POA: Diagnosis not present

## 2023-10-23 DIAGNOSIS — Z7982 Long term (current) use of aspirin: Secondary | ICD-10-CM | POA: Insufficient documentation

## 2023-10-23 LAB — COMPREHENSIVE METABOLIC PANEL
ALT: 16 U/L (ref 0–44)
AST: 22 U/L (ref 15–41)
Albumin: 4 g/dL (ref 3.5–5.0)
Alkaline Phosphatase: 103 U/L (ref 38–126)
Anion gap: 10 (ref 5–15)
BUN: 14 mg/dL (ref 8–23)
CO2: 24 mmol/L (ref 22–32)
Calcium: 8.7 mg/dL — ABNORMAL LOW (ref 8.9–10.3)
Chloride: 101 mmol/L (ref 98–111)
Creatinine, Ser: 0.65 mg/dL (ref 0.44–1.00)
GFR, Estimated: >60 mL/min (ref >60)
Glucose, Bld: 102 mg/dL — ABNORMAL HIGH (ref 70–99)
Potassium: 3.9 mmol/L (ref 3.5–5.1)
Sodium: 135 mmol/L (ref 135–145)
Total Bilirubin: 0.8 mg/dL (ref <1.2)
Total Protein: 7.2 g/dL (ref 6.5–8.1)

## 2023-10-23 LAB — CBC
HCT: 38.6 % (ref 36.0–46.0)
Hemoglobin: 11.7 g/dL — ABNORMAL LOW (ref 12.0–15.0)
MCH: 30.1 pg (ref 26.0–34.0)
MCHC: 30.3 g/dL (ref 30.0–36.0)
MCV: 99.2 fL (ref 80.0–100.0)
Platelets: 241 10*3/uL (ref 150–400)
RBC: 3.89 MIL/uL (ref 3.87–5.11)
RDW: 20.1 % — ABNORMAL HIGH (ref 11.5–15.5)
WBC: 6 10*3/uL (ref 4.0–10.5)
nRBC: 0 % (ref 0.0–0.2)

## 2023-10-23 LAB — LACTATE DEHYDROGENASE: LDH: 178 U/L (ref 98–192)

## 2023-10-23 LAB — VITAMIN D 25 HYDROXY (VIT D DEFICIENCY, FRACTURES): Vit D, 25-Hydroxy: 88.61 ng/mL (ref 30–100)

## 2023-10-30 ENCOUNTER — Inpatient Hospital Stay: Payer: Medicare Other | Admitting: Oncology

## 2023-10-30 VITALS — BP 149/72 | HR 71 | Temp 98.3°F | Resp 18 | Ht 64.0 in | Wt 114.2 lb

## 2023-10-30 DIAGNOSIS — E559 Vitamin D deficiency, unspecified: Secondary | ICD-10-CM

## 2023-10-30 DIAGNOSIS — B351 Tinea unguium: Secondary | ICD-10-CM | POA: Diagnosis not present

## 2023-10-30 DIAGNOSIS — Z7982 Long term (current) use of aspirin: Secondary | ICD-10-CM | POA: Diagnosis not present

## 2023-10-30 DIAGNOSIS — D45 Polycythemia vera: Secondary | ICD-10-CM

## 2023-10-30 DIAGNOSIS — Z801 Family history of malignant neoplasm of trachea, bronchus and lung: Secondary | ICD-10-CM | POA: Diagnosis not present

## 2023-10-30 NOTE — Progress Notes (Signed)
[facility] [address] [place], [place] [address]   CLINIC:  Medical Oncology/Hematology  PCP:  Assunta Found, MD [address] [place] [address] [phone]   REASON FOR VISIT:  Follow-up for JAK2 positive polycythemia vera   PRIOR THERAPY: None  CURRENT THERAPY: Hydrea  INTERVAL HISTORY:   Ms. Czarny 87 y.o. female returns for routine follow-up of JAK2 positive polycythemia vera.  She was last seen by Rojelio Brenner PA-C 04/22/2023.   At today's visit, she reports doing well.  She continues to have some mobility issues with her left wrist after fall last January.  She continues to wear a brace when she leaves her house and at bedtime.  She completed 6 weeks of physical therapy without significant improvement of her mobility.  She continues to take Hydrea 500 mg Monday through Friday, and is tolerating it well.  She denies any abnormal fatigue, mouth sores, nonhealing skin sores, or GI side effects.  She takes aspirin 81 mg daily.   No history of DVT, PE, MI, or CVA.  She denies any aquagenic pruritus, Raynaud's, erythromelalgia, or other vasomotor symptoms.  She denies any B-symptoms, abdominal pain, nausea, or early satiety.  She reports her weight is stable.  Appetite is 100% and energy levels are 85%.  She denies any pain.  ASSESSMENT   1.  JAK2 positive polycythemia vera - Initially diagnosed in August 2005.  Reports that she has been on hydroxyurea for 15+ years -- She did not have bone marrow biopsy - Denies any prior history of DVT, PE, MI, or CVA  - Current dose of Hydrea is 500 mg daily Monday through Friday. - She is tolerating hydroxyurea well. - Denies any aquagenic pruritus or vasomotor symptoms. - No recent infections or B symptoms.      2.  Vitamin D deficiency - Currently taking vitamin D every other week.   - Most recent vitamin D (10/08/2022) normal at 75.03  PLAN: 1. Polycythemia vera (HCC) - Most recent labs  (10/23/23): Hgb 11.7/MCV 99.2, platelets 204, WBC.6.0  Normal LDH.  CMP unremarkable. - Continue Hydrea 500 mg daily Monday through Friday.  Repeat labs in 6 months with RTC 1 week after.   2. Vitamin D deficiency -Continue taking vit d every other week.  -Recheck vitamin D annually (next due in oct 2025)  3.  Onychomycosis: -She is followed by podiatry.  Symptoms appear to have improved.  4.  ORIF left forearm fracture: -Status post ORIF on 12/27/2022. -Continues to wear brace when out of her home and at bedtime.  Having trouble fully pronating. -She completed 6 weeks of PT.   PLAN SUMMARY: >> Continue Hydrea Monday through Friday. >> Recommend follow-up in 6 months with labs a few days before.      REVIEW OF SYSTEMS:   Review of Systems  Musculoskeletal:  Positive for arthralgias (Left wrist).     PHYSICAL EXAM:  ECOG PERFORMANCE STATUS: 0 - Asymptomatic  There were no vitals filed for this visit. Filed Weights   10/30/23 1353  Weight: 114 lb 3.2 oz (51.8 kg)   Physical Exam Constitutional:      Appearance: Normal appearance. She is normal weight.  Cardiovascular:     Heart sounds: Normal heart sounds.  Pulmonary:     Breath sounds: Normal breath sounds.  Musculoskeletal:     Comments: Left wrist splint  Neurological:     General: No focal deficit present.     Mental Status: Mental status  is at baseline.  Psychiatric:        Behavior: Behavior normal. Behavior is cooperative.    PAST MEDICAL/SURGICAL HISTORY:  Past Medical History:  Diagnosis Date   Hypertension    Influenza 11/2012   Osteoporosis    Polycythemia    Polycythemia vera(238.4) 02/25/2013   Diagnosed August 2005 on Hydrea 500 mg 3 times a week with excellent control.    Prolapse, uterus, congenital    ring placement   Past Surgical History:  Procedure Laterality Date   APPENDECTOMY     COLONOSCOPY     INCISION AND DRAINAGE Left 01/21/2022   Procedure: INCISION AND DRAINAGE LEFT INDEX  FINGER;  Surgeon: Betha Loa, MD;  Location: [facility];  Service: Orthopedics;  Laterality: Left;   OOPHORECTOMY     left   opperectomy     ORIF RADIAL FRACTURE Left 12/27/2022   Procedure: OPEN REDUCTION INTERNAL FIXATION (ORIF) RADIAL and ULNAR FRACTURE;  Surgeon: Vickki Hearing, MD;  Location: [facility];  Service: Orthopedics;  Laterality: Left;   REVISION TOTAL HIP ARTHROPLASTY Left     SOCIAL HISTORY:  Social History   Socioeconomic History   Marital status: Divorced    Spouse name: Not on file   Number of children: 1   Years of education: Not on file   Highest education level: Not on file  Occupational History   Not on file  Tobacco Use   Smoking status: Never   Smokeless tobacco: Never  Vaping Use   Vaping status: Never Used  Substance and Sexual Activity   Alcohol use: No   Drug use: No   Sexual activity: Yes    Birth control/protection: Post-menopausal  Other Topics Concern   Not on file  Social History Narrative   Not on file   Social Determinants of Health   Financial Resource Strain: Not on file  Food Insecurity: Not on file  Transportation Needs: Not on file  Physical Activity: Not on file  Stress: Not on file  Social Connections: Not on file  Intimate Partner Violence: Not on file    FAMILY HISTORY:  Family History  Problem Relation Age of Onset   Cancer Brother        throat cancer   Cancer Brother        lung cancer   Cancer Brother        prostate cancer    CURRENT MEDICATIONS:  Outpatient Encounter Medications as of 10/30/2023  Medication Sig   mometasone (NASONEX) 50 MCG/ACT nasal spray INSTILL 2 SRPAYS INTO EACH NOSTRIL ONCE DAILY   aspirin 81 MG tablet Take 81 mg by mouth daily.   diltiazem (CARDIZEM CD) 180 MG 24 hr capsule Take 180 mg by mouth daily.    hydroxyurea (HYDREA) 500 MG capsule May take with food to minimize GI side effects.   Vitamin D, Ergocalciferol, (DRISDOL) 1.25 MG (50000 UNIT) CAPS capsule Take  1 capsule (50,000 Units total) by mouth every 14 (fourteen) days.   No facility-administered encounter medications on file as of 10/30/2023.    ALLERGIES:  Allergies  Allergen Reactions   Codeine     Nausea and vomiting   Diovan [Valsartan]     Rash and itching    Lamisil [Terbinafine] Swelling   Penicillins     Rash and "breaking out in whelps"     LABORATORY DATA:  I have reviewed the labs as listed.  CBC    Component Value Date/Time   WBC 6.0 10/23/2023  1452   RBC 3.89 10/23/2023 1452   HGB 11.7 (L) 10/23/2023 1452   HCT 38.6 10/23/2023 1452   PLT 241 10/23/2023 1452   MCV 99.2 10/23/2023 1452   MCH 30.1 10/23/2023 1452   MCHC 30.3 10/23/2023 1452   RDW 20.1 (H) 10/23/2023 1452   LYMPHSABS 0.9 04/15/2023 1408   MONOABS 0.3 04/15/2023 1408   EOSABS 0.1 04/15/2023 1408   BASOSABS 0.1 04/15/2023 1408      Latest Ref Rng & Units 10/23/2023    2:52 PM 04/15/2023    2:08 PM 12/25/2022   10:36 AM  CMP  Glucose 70 - 99 mg/dL 409  89  811   BUN 8 - 23 mg/dL 14  19  15    Creatinine 0.44 - 1.00 mg/dL 9.14  7.82  9.56   Sodium 135 - 145 mmol/L 135  134  137   Potassium 3.5 - 5.1 mmol/L 3.9  3.4  3.4   Chloride 98 - 111 mmol/L 101  100  102   CO2 22 - 32 mmol/L 24  27  25    Calcium 8.9 - 10.3 mg/dL 8.7  8.5  8.6   Total Protein 6.5 - 8.1 g/dL 7.2  6.6    Total Bilirubin <1.2 mg/dL 0.8  0.9    Alkaline Phos 38 - 126 U/L 103  80    AST 15 - 41 U/L 22  18    ALT 0 - 44 U/L 16  16      DIAGNOSTIC IMAGING:  I have independently reviewed the relevant imaging and discussed with the patient.   WRAP UP:  All questions were answered. The patient knows to call the clinic with any problems, questions or concerns.  Medical decision making: Moderate  Time spent on visit: I spent 25 minutes dedicated to the care of this patient (face-to-face and non-face-to-face) on the date of the encounter to include what is described in the assessment and plan.  Mauro Kaufmann, NP   10/30/23 1:54 PM

## 2024-01-06 ENCOUNTER — Ambulatory Visit: Payer: Medicare Other | Admitting: Obstetrics & Gynecology

## 2024-01-11 ENCOUNTER — Emergency Department (HOSPITAL_COMMUNITY): Payer: Medicare Other

## 2024-01-11 ENCOUNTER — Encounter (HOSPITAL_COMMUNITY): Payer: Self-pay

## 2024-01-11 ENCOUNTER — Emergency Department (HOSPITAL_COMMUNITY)
Admission: EM | Admit: 2024-01-11 | Discharge: 2024-01-15 | Disposition: A | Discharge status: Skilled Nursing Facility | Payer: Medicare Other

## 2024-01-11 ENCOUNTER — Other Ambulatory Visit: Payer: Self-pay

## 2024-01-11 DIAGNOSIS — S32591A Other specified fracture of right pubis, initial encounter for closed fracture: Secondary | ICD-10-CM | POA: Diagnosis not present

## 2024-01-11 DIAGNOSIS — W1839XA Other fall on same level, initial encounter: Secondary | ICD-10-CM | POA: Insufficient documentation

## 2024-01-11 DIAGNOSIS — R6889 Other general symptoms and signs: Secondary | ICD-10-CM | POA: Diagnosis not present

## 2024-01-11 DIAGNOSIS — S3282XA Multiple fractures of pelvis without disruption of pelvic ring, initial encounter for closed fracture: Secondary | ICD-10-CM | POA: Insufficient documentation

## 2024-01-11 DIAGNOSIS — M25551 Pain in right hip: Secondary | ICD-10-CM | POA: Diagnosis not present

## 2024-01-11 DIAGNOSIS — M6281 Muscle weakness (generalized): Secondary | ICD-10-CM | POA: Diagnosis not present

## 2024-01-11 DIAGNOSIS — Z96642 Presence of left artificial hip joint: Secondary | ICD-10-CM | POA: Diagnosis not present

## 2024-01-11 DIAGNOSIS — M1611 Unilateral primary osteoarthritis, right hip: Secondary | ICD-10-CM | POA: Diagnosis not present

## 2024-01-11 DIAGNOSIS — S0990XA Unspecified injury of head, initial encounter: Secondary | ICD-10-CM | POA: Insufficient documentation

## 2024-01-11 DIAGNOSIS — R0902 Hypoxemia: Secondary | ICD-10-CM | POA: Diagnosis not present

## 2024-01-11 DIAGNOSIS — I10 Essential (primary) hypertension: Secondary | ICD-10-CM | POA: Diagnosis not present

## 2024-01-11 DIAGNOSIS — W19XXXA Unspecified fall, initial encounter: Secondary | ICD-10-CM

## 2024-01-11 DIAGNOSIS — M1990 Unspecified osteoarthritis, unspecified site: Secondary | ICD-10-CM | POA: Diagnosis not present

## 2024-01-11 DIAGNOSIS — S32411A Displaced fracture of anterior wall of right acetabulum, initial encounter for closed fracture: Secondary | ICD-10-CM | POA: Diagnosis not present

## 2024-01-11 DIAGNOSIS — Z743 Need for continuous supervision: Secondary | ICD-10-CM | POA: Diagnosis not present

## 2024-01-11 LAB — CBC WITH DIFFERENTIAL/PLATELET
Abs Immature Granulocytes: 0.13 10*3/uL — ABNORMAL HIGH (ref 0.00–0.07)
Basophils Absolute: 0.1 10*3/uL (ref 0.0–0.1)
Basophils Relative: 2 %
Eosinophils Absolute: 0 10*3/uL (ref 0.0–0.5)
Eosinophils Relative: 0 %
HCT: 37.6 % (ref 36.0–46.0)
Hemoglobin: 11.4 g/dL — ABNORMAL LOW (ref 12.0–15.0)
Immature Granulocytes: 2 %
Lymphocytes Relative: 4 %
Lymphs Abs: 0.2 10*3/uL — ABNORMAL LOW (ref 0.7–4.0)
MCH: 28.5 pg (ref 26.0–34.0)
MCHC: 30.3 g/dL (ref 30.0–36.0)
MCV: 94 fL (ref 80.0–100.0)
Monocytes Absolute: 0.4 10*3/uL (ref 0.1–1.0)
Monocytes Relative: 6 %
Neutro Abs: 5.6 10*3/uL (ref 1.7–7.7)
Neutrophils Relative %: 86 %
Platelets: 147 10*3/uL — ABNORMAL LOW (ref 150–400)
RBC: 4 MIL/uL (ref 3.87–5.11)
RDW: 20.2 % — ABNORMAL HIGH (ref 11.5–15.5)
WBC: 6.5 10*3/uL (ref 4.0–10.5)
nRBC: 0 % (ref 0.0–0.2)

## 2024-01-11 LAB — BASIC METABOLIC PANEL
Anion gap: 10 (ref 5–15)
BUN: 16 mg/dL (ref 8–23)
CO2: 23 mmol/L (ref 22–32)
Calcium: 8.3 mg/dL — ABNORMAL LOW (ref 8.9–10.3)
Chloride: 102 mmol/L (ref 98–111)
Creatinine, Ser: 0.66 mg/dL (ref 0.44–1.00)
GFR, Estimated: >60 mL/min (ref >60)
Glucose, Bld: 264 mg/dL — ABNORMAL HIGH (ref 70–99)
Potassium: 4.1 mmol/L (ref 3.5–5.1)
Sodium: 135 mmol/L (ref 135–145)

## 2024-01-11 MED ORDER — TRAMADOL HCL 50 MG PO TABS
50.0000 mg | ORAL_TABLET | Freq: Once | ORAL | Status: AC
  Administered 2024-01-11: 50 mg via ORAL
  Filled 2024-01-11: qty 1

## 2024-01-11 MED ORDER — ONDANSETRON 4 MG PO TBDP
4.0000 mg | ORAL_TABLET | Freq: Once | ORAL | Status: AC
  Administered 2024-01-11: 4 mg via ORAL
  Filled 2024-01-11: qty 1

## 2024-01-11 NOTE — Progress Notes (Signed)
Called about patient this evening. She has a pelvic ring injury. It is nonoperative. She can weight bear as tolerated. I told the ER at [facility] that I do not have privileges there so if she is unable to discharge and medicine would like orthopedics to follow the patient, they would need to transfer her to a medicine service at [facility]. If medicine is comfortable keeping her up at [facility], I am okay with that.   London Sheer, MD Orthopedic Surgeon

## 2024-01-11 NOTE — ED Provider Notes (Signed)
[place] EMERGENCY DEPARTMENT AT [facility] Provider Note   CSN: [id number] Arrival date & time: 01/11/24  1413     History  Chief Complaint  Patient presents with   [name]    [name] is a 88 y.o. female with a history significant for osteoporosis, prior significant fracture of left radius and ulna, left total hip arthroplasty to the care of Dr. Romeo Apple, also has a history of hypertension and polycythemia presenting for evaluation of pain in her right hip from a fall which occurred prior to arrival.  She was standing at her sink when she turned and her left foot slid out causing her to fall landing on her right hip.  She hit her head on the side of the dryer when she fell, she denies LOC, denies headache, neck pain or any other complaints at this time except for right hip pain.  She is not on anticoagulants.  She has had no treatment prior to arrival.  The history is provided by the patient.       Home Medications Prior to Admission medications   Medication Sig Start Date End Date Taking? Authorizing Provider  aspirin 81 MG tablet Take 81 mg by mouth daily.    [provider]  diltiazem (CARDIZEM CD) 180 MG 24 hr capsule Take 180 mg by mouth daily.     [provider]  hydroxyurea (HYDREA) 500 MG capsule May take with food to minimize GI side effects. 04/22/23   Carnella Guadalajara, PA-C  mometasone (NASONEX) 50 MCG/ACT nasal spray INSTILL 2 SRPAYS INTO EACH NOSTRIL ONCE DAILY 10/23/23   [provider]  Vitamin D, Ergocalciferol, (DRISDOL) 1.25 MG (50000 UNIT) CAPS capsule Take 1 capsule (50,000 Units total) by mouth every 14 (fourteen) days. 04/22/23   Carnella Guadalajara, PA-C      Allergies    Codeine, Diovan [valsartan], Lamisil [terbinafine], and Penicillins    Review of Systems   Review of Systems  Constitutional:  Negative for fever.  HENT:  Negative for congestion and sore throat.   Eyes: Negative.   Respiratory:  Negative  for chest tightness and shortness of breath.   Cardiovascular:  Negative for chest pain.  Gastrointestinal:  Negative for abdominal pain, nausea and vomiting.  Genitourinary: Negative.   Musculoskeletal:  Positive for arthralgias. Negative for joint swelling and neck pain.  Skin: Negative.  Negative for rash and wound.  Neurological:  Negative for dizziness, weakness, light-headedness, numbness and headaches.  Psychiatric/Behavioral: Negative.    All other systems reviewed and are negative.   Physical Exam Updated Vital Signs BP (!) 147/65   Pulse 76   Temp 97.8 F (36.6 C)   Resp 16   Ht 5\' 4"  (1.626 m)   Wt 51.8 kg   SpO2 94%   BMI 19.60 kg/m  Physical Exam Vitals and nursing note reviewed.  Constitutional:      Appearance: She is well-developed.  HENT:     Head: Normocephalic and atraumatic.  Eyes:     Conjunctiva/sclera: Conjunctivae normal.  Cardiovascular:     Rate and Rhythm: Normal rate and regular rhythm.     Heart sounds: Normal heart sounds.  Pulmonary:     Effort: Pulmonary effort is normal.     Breath sounds: Normal breath sounds. No wheezing.  Abdominal:     General: Bowel sounds are normal.     Palpations: Abdomen is soft.     Tenderness: There is no abdominal tenderness.  Musculoskeletal:  General: Tenderness present. Normal range of motion.     Cervical back: Normal range of motion.  Skin:    General: Skin is warm and dry.  Neurological:     Mental Status: She is alert.     ED Results / Procedures / Treatments   Labs (all labs ordered are listed, but only abnormal results are displayed) Labs Reviewed  CBC WITH DIFFERENTIAL/PLATELET - Abnormal; Notable for the following components:      Result Value   Hemoglobin 11.4 (*)    RDW 20.2 (*)    Platelets 147 (*)    Lymphs Abs 0.2 (*)    Abs Immature Granulocytes 0.13 (*)    All other components within normal limits  BASIC METABOLIC PANEL - Abnormal; Notable for the following components:    Glucose, Bld 264 (*)    Calcium 8.3 (*)    All other components within normal limits    EKG None  Radiology CT Hip Right Wo Contrast Result Date: 01/11/2024 CLINICAL DATA:  Right hip pain status post fall. EXAM: CT OF THE RIGHT HIP WITHOUT CONTRAST TECHNIQUE: Multidetector CT imaging of the right hip was performed according to the standard protocol. Multiplanar CT image reconstructions were also generated. RADIATION DOSE REDUCTION: This exam was performed according to the departmental dose-optimization program which includes automated exposure control, adjustment of the mA and/or kV according to patient size and/or use of iterative reconstruction technique. COMPARISON:  Same day radiographs of the pelvis and right femur dated 01/11/2024. FINDINGS: Bones/Joint/Cartilage Acute minimally displaced fracture of the right sacral ala (series 3, images 3-16 and series 6, images 52-60). The right sacroiliac joint appears anatomically aligned. Minimally displaced fracture of the right anterior acetabular wall (series 3, image 54 and series 6, image 38). The right femoral head is seated within the acetabulum. Minimally displaced right inferior pubic ramus fracture (series 3, images 73-78). Diffuse osseous demineralization. No additional fracture identified. Mild degenerative changes of the right hip. Right greater trochanteric enthesopathy. Ligaments Ligaments are suboptimally evaluated by CT. Muscles and Tendons Mild-to-moderate fatty atrophy of the right gluteus minimus muscle. No intramuscular fluid collection. Soft tissue Soft tissue swelling at the right lateral hip. Atherosclerotic vascular calcifications are noted. IMPRESSION: 1. Minimally displaced fracture of the right sacral ala. The right sacroiliac joint is anatomically aligned. 2. Minimally displaced fracture of the right anterior acetabular wall. The right femoral head is seated within the acetabulum. 3. Minimally displaced fracture of the right  inferior pubic ramus. Electronically Signed   By: Hart Robinsons M.D.   On: 01/11/2024 18:11   CT Head Wo Contrast Result Date: 01/11/2024 CLINICAL DATA:  Fall.  Blunt head trauma. EXAM: CT HEAD WITHOUT CONTRAST TECHNIQUE: Contiguous axial images were obtained from the base of the skull through the vertex without intravenous contrast. RADIATION DOSE REDUCTION: This exam was performed according to the departmental dose-optimization program which includes automated exposure control, adjustment of the mA and/or kV according to patient size and/or use of iterative reconstruction technique. COMPARISON:  None Available. FINDINGS: Brain: No evidence of intracranial hemorrhage, acute infarction, hydrocephalus, extra-axial collection, or mass lesion/mass effect. Mild cerebral atrophy and moderate chronic small vessel disease are noted. Vascular:  No hyperdense vessel or other acute findings. Skull: No evidence of fracture or other significant bone abnormality. Sinuses/Orbits:  No acute findings. Other: None. IMPRESSION: No acute intracranial abnormality. Mild cerebral atrophy and moderate chronic small vessel disease. Electronically Signed   By: Danae Orleans M.D.   On: 01/11/2024 15:41  DG FEMUR, MIN 2 VIEWS RIGHT Result Date: 01/11/2024 CLINICAL DATA:  Right hip pain.  Fall. EXAM: PELVIS - 1-2 VIEW; RIGHT FEMUR 2 VIEWS COMPARISON:  CT abdomen pelvis 04/13/2010 FINDINGS: There is diffuse decreased bone mineralization. Interval total left hip arthroplasty, partially visualized. No perihardware lucency is seen to indicate hardware failure or loosening. Mild bilateral sacroiliac subchondral sclerosis. Mild pubic symphysis articular surface degenerative irregularity. The right femoroacetabular joint space is maintained. Minimal right femoral head-neck junction degenerative osteophytosis. Moderate lateral malleolar compartment joint space narrowing and peripheral osteophytosis. Moderate to high-grade patellofemoral joint  space narrowing is suggested on limited extension knee view. No knee joint effusion. Mild superior patellar degenerative osteophytosis. IMPRESSION: Compared to remote CT abdomen pelvis 04/13/2010: 1. Within the limitations of decreased bone mineralization, no acute fracture is seen within the pelvis or right femur. 2. Interval total left hip arthroplasty without evidence of hardware failure or loosening within the visualized proximal portion of the prosthesis. 3. Moderate lateral and patellofemoral compartment osteoarthritis of the right knee. Electronically Signed   By: Neita Garnet M.D.   On: 01/11/2024 15:36   DG Pelvis 1-2 Views Result Date: 01/11/2024 CLINICAL DATA:  Right hip pain.  Fall. EXAM: PELVIS - 1-2 VIEW; RIGHT FEMUR 2 VIEWS COMPARISON:  CT abdomen pelvis 04/13/2010 FINDINGS: There is diffuse decreased bone mineralization. Interval total left hip arthroplasty, partially visualized. No perihardware lucency is seen to indicate hardware failure or loosening. Mild bilateral sacroiliac subchondral sclerosis. Mild pubic symphysis articular surface degenerative irregularity. The right femoroacetabular joint space is maintained. Minimal right femoral head-neck junction degenerative osteophytosis. Moderate lateral malleolar compartment joint space narrowing and peripheral osteophytosis. Moderate to high-grade patellofemoral joint space narrowing is suggested on limited extension knee view. No knee joint effusion. Mild superior patellar degenerative osteophytosis. IMPRESSION: Compared to remote CT abdomen pelvis 04/13/2010: 1. Within the limitations of decreased bone mineralization, no acute fracture is seen within the pelvis or right femur. 2. Interval total left hip arthroplasty without evidence of hardware failure or loosening within the visualized proximal portion of the prosthesis. 3. Moderate lateral and patellofemoral compartment osteoarthritis of the right knee. Electronically Signed   By: Neita Garnet M.D.   On: 01/11/2024 15:36    Procedures Procedures    Medications Ordered in ED Medications  ondansetron (ZOFRAN-ODT) disintegrating tablet 4 mg (4 mg Oral Given 01/11/24 1705)  traMADol (ULTRAM) tablet 50 mg (50 mg Oral Given 01/11/24 1854)    ED Course/ Medical Decision Making/ A&P                                 Medical Decision Making Patient presenting secondary to a fall prior to arrival, minor head injury with negative CT head imaging.  She has significant pain in her groin region, although she can range her right leg without significant discomfort weightbearing was not tolerated.  For this reason she was sent for CT imaging as the plain films did not suggest any fractures.  There are multiple fractures on CT per results below, but no disruption of the pelvic ring.  Discussed choices including going home with a wheelchair/walker which she already has, however given her degree of pain when standing I am not sure a walker is going to be effective initially.  She does not tolerate pain medications without significant nausea vomiting and drowsiness, although she was able to tolerate tramadol from her recent left forearm injury and subsequent surgery.  She is given a dose of this here, anticipate admission with plans for short-term rehab while she recovers from this injury.  Patient and daughter at bedside are agreeable with this plan.  Amount and/or Complexity of Data Reviewed Labs: ordered.    Details: Labs obtained including a CBC and bmet. Radiology: ordered.    Details: CT head reviewed, negative for acute intracranial injury.  Plain film of pelvis and right femur negative for acute injuries.  We proceeded to CT of this hip when we discovered she was unable to bear weight on this right leg, minimally displaced fracture of the right sacral alla, the right inferior pubic ramus and right anterior acetabular wall. Discussion of management or test interpretation with external  provider(s): Spoke with Dr. Christell Constant of orthopedics who reviewed CT imaging, patient can be weightbearing as tolerated, this is not an unstable fracture.  Pt discussed with Dr. Thomes Dinning with hospitalist service.  He recommends border status with plans for [facility] and PT in am, per hospital policy - orders placed.   Risk Prescription drug management. Decision regarding hospitalization.           Final Clinical Impression(s) / ED Diagnoses Final diagnoses:  Fall, initial encounter  Minor head injury, initial encounter  Multiple closed fractures of pelvis without disruption of pelvic ring, initial encounter [facility])    Rx / DC Orders ED Discharge Orders     None         Victoriano Lain 01/11/24 2100    Coral Spikes, DO 01/12/24 [phone]

## 2024-01-11 NOTE — ED Notes (Signed)
Pt was able to easily get out of bed but when I stood her up to ambulate; she could not keep her balance. Pt stated she did not feel comfortable to walk.

## 2024-01-11 NOTE — ED Triage Notes (Signed)
Pt bib [facility] d/t a fall. Pt c/o right hip and hitting her head. Pt is not on blood thinners. Pt states she was standing at the counter and when she went to turn around she fell she lost her balance.

## 2024-01-11 NOTE — Progress Notes (Signed)
I received a call regarding admission of this patient.  After reviewing the chart, it was noted that patient had multiple closed fractures of pelvis without disruption of pelvic ring which does not require surgical repair. On talking with the ED PA, It was noted that patient only needed PT and TOC for decision for rehab ,  ED PA agreed that this can be arranged from the ED and agreed to cancel the hospitalist consult.

## 2024-01-11 NOTE — ED Notes (Signed)
Pt cleaned, new linen and brief placed. Pt placed on purewick. Warm blanket applied.

## 2024-01-12 ENCOUNTER — Ambulatory Visit: Payer: Medicare Other | Admitting: Obstetrics & Gynecology

## 2024-01-12 DIAGNOSIS — S3282XA Multiple fractures of pelvis without disruption of pelvic ring, initial encounter for closed fracture: Secondary | ICD-10-CM | POA: Diagnosis not present

## 2024-01-12 MED ORDER — TRAMADOL HCL 50 MG PO TABS
50.0000 mg | ORAL_TABLET | Freq: Once | ORAL | Status: AC
  Administered 2024-01-12: 50 mg via ORAL
  Filled 2024-01-12: qty 1

## 2024-01-12 NOTE — ED Notes (Signed)
Patient placed on hospital bed.

## 2024-01-12 NOTE — Evaluation (Signed)
Physical Therapy Evaluation Patient Details Name: [name] MRN: [id number] DOB: [birth year] Today's Date: 01/12/2024  History of Present Illness  [name] is a 88 y.o. female with a history significant for osteoporosis, prior significant fracture of left radius and ulna, left total hip arthroplasty to the care of Dr. Romeo Apple, also has a history of hypertension and polycythemia presenting for evaluation of pain in her right hip from a fall which occurred prior to arrival.  She was standing at her sink when she turned and her left foot slid out causing her to fall landing on her right hip.  She hit her head on the side of the dryer when she fell, she denies LOC, denies headache, neck pain or any other complaints at this time except for right hip pain.  She is not on anticoagulants.  She has had no treatment prior to arrival.   Clinical Impression  Patient demonstrates slow labored movement for sitting up at bedside with difficulty moving RLE due to increasing hip/sacral pain, very unsteady on feet with poor tolerance for weightbearing on RLE and limited to a few side steps before having to sit due to fall risk.  Patient tolerated sitting up in chair after therapy.  Patient will benefit from continued skilled physical therapy in hospital and recommended venue below to increase strength, balance, endurance for safe ADLs and gait.           If plan is discharge home, recommend the following: A lot of help with walking and/or transfers;A lot of help with bathing/dressing/bathroom;Assistance with cooking/housework;Help with stairs or ramp for entrance   Can travel by private vehicle   No    Equipment Recommendations None recommended by PT  Recommendations for Other Services       Functional Status Assessment Patient has had a recent decline in their functional status and demonstrates the ability to make significant improvements in function in a reasonable and predictable amount of time.      Precautions / Restrictions Precautions Precautions: Fall Required Braces or Orthoses: Splint/Cast Splint/Cast: left wrist Restrictions Weight Bearing Restrictions Per Provider Order: No      Mobility  Bed Mobility Overal bed mobility: Needs Assistance Bed Mobility: Supine to Sit     Supine to sit: Mod assist, Max assist     General bed mobility comments: slow labored movement with increased right hip pain    Transfers Overall transfer level: Needs assistance Equipment used: Rolling walker (2 wheels) Transfers: Sit to/from Stand, Bed to chair/wheelchair/BSC Sit to Stand: Mod assist   Step pivot transfers: Mod assist, Max assist       General transfer comment: poor tolerance for weight bearing on RLE due to increased pain    Ambulation/Gait Ambulation/Gait assistance: Max assist Gait Distance (Feet): 3 Feet Assistive device: Rolling walker (2 wheels) Gait Pattern/deviations: Decreased step length - right, Decreased step length - left, Decreased stance time - right, Decreased stride length, Shuffle, Antalgic Gait velocity: slow     General Gait Details: limited to a few slow labored side steps with mostly shuffling on LLE due increased pain right hip  Stairs            Wheelchair Mobility     Tilt Bed    Modified Rankin (Stroke Patients Only)       Balance Overall balance assessment: Needs assistance Sitting-balance support: Feet unsupported, No upper extremity supported Sitting balance-Leahy Scale: Fair Sitting balance - Comments: seated at EOB   Standing balance support: Reliant  on assistive device for balance, During functional activity, Bilateral upper extremity supported Standing balance-Leahy Scale: Poor Standing balance comment: using RW                             Pertinent Vitals/Pain Pain Assessment Pain Assessment: Faces Faces Pain Scale: Hurts even more Pain Location: Right hip/leg Pain Descriptors / Indicators:  Discomfort, Grimacing, Guarding, Sharp Pain Intervention(s): Limited activity within patient's tolerance, Monitored during session, Premedicated before session, Repositioned    Home Living Family/patient expects to be discharged to:: Private residence Living Arrangements: Alone Available Help at Discharge: Family;Available 24 hours/day Type of Home: House Home Access: Stairs to enter Entrance Stairs-Rails: None;Right;Left Entrance Stairs-Number of Steps: 3 steps in front without side rails, 7 steps in back with bilateral side rails - to wide to reach both   Home Layout: One level Home Equipment: Agricultural consultant (2 wheels);Cane - single point;BSC/3in1;Shower seat      Prior Function Prior Level of Function : Independent/Modified Independent;Driving             Mobility Comments: household and short distanced comunity ambulation using SPC ADLs Comments: Independent     Extremity/Trunk Assessment   Upper Extremity Assessment Upper Extremity Assessment: Generalized weakness;LUE deficits/detail LUE Deficits / Details: grossly 3+/5 LUE Sensation: WNL LUE Coordination: WNL    Lower Extremity Assessment Lower Extremity Assessment: Generalized weakness;RLE deficits/detail RLE Deficits / Details: grossly -3/5 RLE: Unable to fully assess due to pain RLE Sensation: WNL RLE Coordination: WNL    Cervical / Trunk Assessment Cervical / Trunk Assessment: Normal  Communication   Communication Communication: No apparent difficulties Cueing Techniques: Verbal cues;Tactile cues  Cognition Arousal: Alert Behavior During Therapy: WFL for tasks assessed/performed Overall Cognitive Status: Within Functional Limits for tasks assessed                                          General Comments      Exercises     Assessment/Plan    PT Assessment Patient needs continued PT services  PT Problem List Decreased strength;Decreased activity tolerance;Decreased  balance;Decreased mobility       PT Treatment Interventions DME instruction;Gait training;Stair training;Functional mobility training;Therapeutic activities;Therapeutic exercise;Balance training;Patient/family education    PT Goals (Current goals can be found in the Care Plan section)  Acute Rehab PT Goals Patient Stated Goal: return home after rehab PT Goal Formulation: With patient Time For Goal Achievement: 01/26/24 Potential to Achieve Goals: Good    Frequency Min 2X/week     Co-evaluation               AM-PAC PT "6 Clicks" Mobility  Outcome Measure Help needed turning from your back to your side while in a flat bed without using bedrails?: A Lot Help needed moving from lying on your back to sitting on the side of a flat bed without using bedrails?: A Lot Help needed moving to and from a bed to a chair (including a wheelchair)?: A Lot Help needed standing up from a chair using your arms (e.g., wheelchair or bedside chair)?: A Lot Help needed to walk in hospital room?: A Lot Help needed climbing 3-5 steps with a railing? : Total 6 Click Score: 11    End of Session   Activity Tolerance: Patient tolerated treatment well;Patient limited by fatigue;Patient limited by pain Patient left: in  chair;with call bell/phone within reach Nurse Communication: Mobility status PT Visit Diagnosis: Unsteadiness on feet (R26.81);Other abnormalities of gait and mobility (R26.89);Muscle weakness (generalized) (M62.81);Pain Pain - Right/Left: Right Pain - part of body: Hip    Time: 0925-0950 PT Time Calculation (min) (ACUTE ONLY): 25 min   Charges:   PT Evaluation $PT Eval Moderate Complexity: 1 Mod PT Treatments $Therapeutic Activity: 23-37 mins PT General Charges $$ ACUTE PT VISIT: 1 Visit         12:25 PM, 01/12/24 Ocie Bob, MPT Physical Therapist with [facility] 336 [phone] office [phone] mobile phone

## 2024-01-12 NOTE — Plan of Care (Signed)
  Problem: Acute Rehab PT Goals(only PT should resolve) Goal: Pt Will Go Supine/Side To Sit Outcome: Progressing Flowsheets (Taken 01/12/2024 1226) Pt will go Supine/Side to Sit: with moderate assist Goal: Patient Will Transfer Sit To/From Stand Outcome: Progressing Flowsheets (Taken 01/12/2024 1226) Patient will transfer sit to/from stand: with moderate assist Goal: Pt Will Transfer Bed To Chair/Chair To Bed Outcome: Progressing Flowsheets (Taken 01/12/2024 1226) Pt will Transfer Bed to Chair/Chair to Bed: with mod assist Goal: Pt Will Ambulate Outcome: Progressing Flowsheets (Taken 01/12/2024 1226) Pt will Ambulate:  10 feet  with moderate assist  with rolling walker   12:27 PM, 01/12/24 Ocie Bob, MPT Physical Therapist with [facility] [phone] office [phone] mobile phone

## 2024-01-12 NOTE — [place] FL2 (Signed)
  [place] MEDICAID FL2 LEVEL OF CARE FORM     IDENTIFICATION  Patient Name: [name] Birthdate: May 14, 1936 Sex: female Admission Date (Current Location): 01/11/2024  [facility] and [place] Number:  [employer] and Address:  [facility],  [address] [place] [address]      Provider Number: [id number]  Attending Physician Name and Address:  Benjiman Core MD   Relative Name and Phone Number:  Prentice Docker ( daughter ) [phone]    Current Level of Care: Hospital Recommended Level of Care: Skilled Nursing Facility Prior Approval Number:    Date Approved/Denied:   PASRR Number: [id number]  Discharge Plan: SNF    Current Diagnoses: Patient Active Problem List   Diagnosis Date Noted   Closed fracture of radius and ulna, shaft, left, with routine healing, subsequent encounter 12/27/2022   H/O total hip arthroplasty, left 10/01/2018   Uterovaginal prolapse, complete 10/12/2013   Vaginitis and vulvovaginitis 07/05/2013   Polycythemia vera (HCC) 02/25/2013    Orientation RESPIRATION BLADDER Height & Weight     Self, Time, Situation, Place  Normal Incontinent Weight: 114 lb 3.2 oz (51.8 kg) Height:  5\' 4"  (162.6 cm)  BEHAVIORAL SYMPTOMS/MOOD NEUROLOGICAL BOWEL NUTRITION STATUS      Incontinent Diet (See AVS)  AMBULATORY STATUS COMMUNICATION OF NEEDS Skin   Extensive Assist Verbally Normal                       Personal Care Assistance Level of Assistance  Bathing, Feeding, Dressing Bathing Assistance: Maximum assistance Feeding assistance: Independent Dressing Assistance: Maximum assistance     Functional Limitations Info  Sight, Hearing, Speech Sight Info: Adequate Hearing Info: Adequate Speech Info: Adequate    SPECIAL CARE FACTORS FREQUENCY  PT (By licensed PT), OT (By licensed OT)     PT Frequency: 5 x a week OT Frequency: 5 x a week            Contractures Contractures Info: Not present    Additional  Factors Info  Code Status, Allergies Code Status Info: FULL Allergies Info: Codeine, Diovan, Lamisil, and Penicillins           Current Medications (01/12/2024):  This is the current hospital active medication list No current facility-administered medications for this encounter.   Current Outpatient Medications  Medication Sig Dispense Refill   aspirin 81 MG tablet Take 81 mg by mouth daily.     diltiazem (CARDIZEM CD) 180 MG 24 hr capsule Take 180 mg by mouth daily.      hydroxyurea (HYDREA) 500 MG capsule May take with food to minimize GI side effects. (Patient taking differently: Take 500 mg by mouth daily. May take with food to minimize GI side effects. Monday-Friday) 64 capsule 3   mometasone (NASONEX) 50 MCG/ACT nasal spray INSTILL 2 SRPAYS INTO EACH NOSTRIL ONCE DAILY     Vitamin D, Ergocalciferol, (DRISDOL) 1.25 MG (50000 UNIT) CAPS capsule Take 1 capsule (50,000 Units total) by mouth every 14 (fourteen) days. 6 capsule 3     Discharge Medications: Please see after visit summary for a list of discharge medications.  Relevant Imaging Results:  Relevant Lab Results:   Additional Information SSN: [id number]  Isabella Bowens, [place]

## 2024-01-12 NOTE — ED Notes (Signed)
Transition of Care [facility]) - Emergency Department Mini Assessment   Patient Details  Name: [name] MRN: [id number] Date of Birth: 09/23/1936  Transition of Care [facility]) CM/SW Contact:    Isabella Bowens, LCSWA Phone Number: 01/12/2024, 1:15 PM   Clinical Narrative: CSW spoke with daughter to discuss pt prior living situation and PT recommendation. Daughter shared that pt lived alone and use to have rehab for about 2 weeks in the home after she had broke her hip. CSW shared PT recommendation for SNF , daughter stated that she wanted her mom referral to go to [facility], [place], and [facility] . [facility] continue to follow.    ED Mini Assessment: What brought you to the Emergency Department? : Fall  Barriers to Discharge: SNF Pending bed offer  Barrier interventions: Sending pt referral out to designated facilities locally in [place] of departure: Ambulance  Interventions which prevented an admission or readmission: SNF Placement    Patient Contact and Communications Key Contact 1: Prentice Docker   Spoke with: Daughter Contact Date: 01/12/24,   Contact time: 1314 Contact Phone Number: [phone] Call outcome: PT recommendation for SNF, daughter shared that she wanted pt to stay locally in [place] .  Patient states their goals for this hospitalization and ongoing recovery are:: DC to SNF CMS Medicare.gov Compare Post Acute Care list provided to:: Patient Represenative (must comment) (Daughter- Rinaldo Cloud) Choice offered to / list presented to : Adult Children  Admission diagnosis:  Fall Patient Active Problem List   Diagnosis Date Noted   Closed fracture of radius and ulna, shaft, left, with routine healing, subsequent encounter 12/27/2022   H/O total hip arthroplasty, left 10/01/2018   Uterovaginal prolapse, complete 10/12/2013   Vaginitis and vulvovaginitis 07/05/2013   Polycythemia vera (HCC) 02/25/2013   PCP:  Assunta Found, MD Pharmacy:    [facility] [place], [place] - [address] [place] [address] Phone: [phone] Fax: [phone]

## 2024-01-13 DIAGNOSIS — S3282XA Multiple fractures of pelvis without disruption of pelvic ring, initial encounter for closed fracture: Secondary | ICD-10-CM | POA: Diagnosis not present

## 2024-01-13 MED ORDER — HYDROXYUREA 500 MG PO CAPS
500.0000 mg | ORAL_CAPSULE | ORAL | Status: DC
  Administered 2024-01-13 – 2024-01-15 (×3): 500 mg via ORAL
  Filled 2024-01-13 (×3): qty 1

## 2024-01-13 MED ORDER — HYDROXYUREA 500 MG PO CAPS: 500.0000 mg | ORAL_CAPSULE | Freq: Every day | ORAL | Status: DC

## 2024-01-13 MED ORDER — ASPIRIN 81 MG PO CHEW
81.0000 mg | CHEWABLE_TABLET | Freq: Every day | ORAL | Status: DC
  Administered 2024-01-13 – 2024-01-15 (×3): 81 mg via ORAL
  Filled 2024-01-13 (×3): qty 1

## 2024-01-13 MED ORDER — DILTIAZEM HCL ER COATED BEADS 180 MG PO CP24
180.0000 mg | ORAL_CAPSULE | Freq: Every day | ORAL | Status: DC
  Administered 2024-01-13 – 2024-01-15 (×3): 180 mg via ORAL
  Filled 2024-01-13 (×3): qty 1

## 2024-01-13 NOTE — ED Notes (Signed)
CSW checked destination list and seen that [facility] accepted pt in the [place] . CSW reached out to pt daughter Rinaldo Cloud and shared with her that pt was accepted to [facility]. Daughter stated that she would prefer patient to go to [facility] since it is in [place]. CSW spoke with Eunice Blase and she stated that she should have a bed to come open Thursday. Daughter agreeable to waiting for bed to come open at [facility]. Auth started for designated facility. CSW will continue to follow.

## 2024-01-13 NOTE — ED Provider Notes (Signed)
Emergency Medicine Observation Re-evaluation Note  [name] is a 88 y.o. female, seen on rounds today.  Pt initially presented to the ED for complaints of fall with pelvic fractures. No new c/o this AM, pt has been boarding ED awaiting rehab/SNF placement.   Physical Exam  BP (!) 149/97 (BP Location: Left Arm)   Pulse 84   Temp 98.5 F (36.9 C) (Oral)   Resp 15   Ht 1.626 m (5\' 4" )   Wt 51.8 kg   SpO2 95%   BMI 19.60 kg/m  Physical Exam General: resting.  Cardiac: regular rate. Lungs: breathing comfortably.   ED Course / MDM    I have reviewed the labs performed to date as well as medications administered while in observation.  Recent changes in the last 24 hours include ED obs, reassessment.   Plan  Pt has been boarding ED awaiting rehab/SNF placement.   Will re-start patients home meds.   Dispo per [facility] team.     Cathren Laine, MD 01/13/24 [phone]

## 2024-01-13 NOTE — ED Notes (Signed)
Pt is on bedside commode,changing pt's purewick out as well

## 2024-01-14 DIAGNOSIS — S3282XA Multiple fractures of pelvis without disruption of pelvic ring, initial encounter for closed fracture: Secondary | ICD-10-CM | POA: Diagnosis not present

## 2024-01-14 NOTE — ED Provider Notes (Signed)
Emergency Medicine Observation Re-evaluation Note  [name] is a 88 y.o. female, seen on rounds today.  Pt initially presented to the ED for complaints of Fall Currently, the patient is awaiting nursing home placement.  Physical Exam  BP 112/70   Pulse 78   Temp 98.2 F (36.8 C) (Oral)   Resp 18   Ht 5\' 4"  (1.626 m)   Wt 51.8 kg   SpO2 100%   BMI 19.60 kg/m  Physical Exam Asleep and in no acute distress  ED Course / MDM  EKG:   I have reviewed the labs performed to date as well as medications administered while in observation.  Recent changes in the last 24 hours include none.  Plan  Current plan is for nursing home placement.    Bethann Berkshire, MD 01/14/24 [phone]

## 2024-01-14 NOTE — Progress Notes (Signed)
Physical Therapy Treatment Patient Details Name: [name] MRN: [id number] DOB: 02-12-1936 Today's Date: 01/14/2024   History of Present Illness [name] is a 88 y.o. female with a history significant for osteoporosis, prior significant fracture of left radius and ulna, left total hip arthroplasty to the care of Dr. Romeo Apple, also has a history of hypertension and polycythemia presenting for evaluation of pain in her right hip from a fall which occurred prior to arrival.  She was standing at her sink when she turned and her left foot slid out causing her to fall landing on her right hip.  She hit her head on the side of the dryer when she fell, she denies LOC, denies headache, neck pain or any other complaints at this time except for right hip pain.  She is not on anticoagulants.  She has had no treatment prior to arrival.    PT Comments  Patient was agreeable to therapy. When performing bed mobility patient needed mod assist to go from supine to sit. Once EOB patient was able to maintain balance with feet supported while performing exercises. Using RW patient transferred form bed to chair. Needing mod assist for initial boost. Once standing min assist was provided for completion of transfer. RW and min assist was given for ambulation. Verbal/tactile cueing was given to push RW a little more in front to prevent tipping forward while walking. Patient was left in chair with call bell at conclusion of session. Patient will benefit from continued skilled physical therapy in hospital and recommended venue below to increase strength, balance, endurance for safe ADLs and gait.      If plan is discharge home, recommend the following: A lot of help with walking and/or transfers;A lot of help with bathing/dressing/bathroom;Assistance with cooking/housework;Help with stairs or ramp for entrance   Can travel by private vehicle     No  Equipment Recommendations  None recommended by PT     Recommendations for Other Services       Precautions / Restrictions Precautions Precautions: Fall Required Braces or Orthoses: Splint/Cast Splint/Cast: left wrist Restrictions Weight Bearing Restrictions Per Provider Order: No     Mobility  Bed Mobility Overal bed mobility: Needs Assistance Bed Mobility: Supine to Sit     Supine to sit: Min assist, Mod assist     General bed mobility comments: slow labored movement with increased right hip pain    Transfers Overall transfer level: Needs assistance Equipment used: Rolling walker (2 wheels) Transfers: Sit to/from Stand, Bed to chair/wheelchair/BSC Sit to Stand: Min assist, Mod assist   Step pivot transfers: Min assist, Mod assist            Ambulation/Gait Ambulation/Gait assistance: Min assist Gait Distance (Feet): 30 Feet Assistive device: Rolling walker (2 wheels) Gait Pattern/deviations: Decreased step length - right, Decreased step length - left, Decreased stance time - right, Decreased stride length, Shuffle, Antalgic Gait velocity: slow     General Gait Details: slow, labored movment   Stairs             Wheelchair Mobility     Tilt Bed    Modified Rankin (Stroke Patients Only)       Balance Overall balance assessment: Needs assistance Sitting-balance support: Feet unsupported, No upper extremity supported Sitting balance-Leahy Scale: Fair Sitting balance - Comments: seated at EOB   Standing balance support: Reliant on assistive device for balance, During functional activity, Bilateral upper extremity supported Standing balance-Leahy Scale: Poor Standing balance comment: poor/fair  using RW                            Cognition Arousal: Alert Behavior During Therapy: WFL for tasks assessed/performed Overall Cognitive Status: Within Functional Limits for tasks assessed                                          Exercises General Exercises - Lower  Extremity Long Arc Quad: Seated, AROM, Both, 10 reps Hip Flexion/Marching: Seated, AROM, Both, 10 reps Toe Raises: Seated, AROM, Both, 10 reps Heel Raises: Seated, AROM, Both, 10 reps    General Comments        Pertinent Vitals/Pain Pain Assessment Pain Assessment: 0-10 Pain Score: 5  Pain Location: Right hip/leg Pain Descriptors / Indicators: Sore Pain Intervention(s): Limited activity within patient's tolerance, Monitored during session, Repositioned    Home Living                          Prior Function            PT Goals (current goals can now be found in the care plan section) Acute Rehab PT Goals Patient Stated Goal: return home after rehab PT Goal Formulation: With patient Time For Goal Achievement: 01/26/24 Potential to Achieve Goals: Good Progress towards PT goals: Progressing toward goals    Frequency    Min 2X/week      PT Plan      Co-evaluation              AM-PAC PT "6 Clicks" Mobility   Outcome Measure  Help needed turning from your back to your side while in a flat bed without using bedrails?: A Lot Help needed moving from lying on your back to sitting on the side of a flat bed without using bedrails?: A Lot Help needed moving to and from a bed to a chair (including a wheelchair)?: A Lot Help needed standing up from a chair using your arms (e.g., wheelchair or bedside chair)?: A Lot Help needed to walk in hospital room?: A Little Help needed climbing 3-5 steps with a railing? : Total 6 Click Score: 12    End of Session   Activity Tolerance: Patient tolerated treatment well;Patient limited by fatigue;Patient limited by pain Patient left: in chair;with call bell/phone within reach Nurse Communication: Mobility status PT Visit Diagnosis: Unsteadiness on feet (R26.81);Other abnormalities of gait and mobility (R26.89);Muscle weakness (generalized) (M62.81);Pain Pain - Right/Left: Right Pain - part of body: Hip     Time:  1610-9604 PT Time Calculation (min) (ACUTE ONLY): 22 min  Charges:    $Gait Training: 8-22 mins $Therapeutic Exercise: 8-22 mins PT General Charges $$ ACUTE PT VISIT: 1 Visit                     Sarahanne Novakowski SPT

## 2024-01-15 DIAGNOSIS — S0990XA Unspecified injury of head, initial encounter: Secondary | ICD-10-CM | POA: Diagnosis not present

## 2024-01-15 DIAGNOSIS — I4891 Unspecified atrial fibrillation: Secondary | ICD-10-CM | POA: Diagnosis not present

## 2024-01-15 DIAGNOSIS — R296 Repeated falls: Secondary | ICD-10-CM | POA: Diagnosis not present

## 2024-01-15 DIAGNOSIS — S3282XA Multiple fractures of pelvis without disruption of pelvic ring, initial encounter for closed fracture: Secondary | ICD-10-CM | POA: Diagnosis not present

## 2024-01-15 DIAGNOSIS — E559 Vitamin D deficiency, unspecified: Secondary | ICD-10-CM | POA: Diagnosis not present

## 2024-01-15 DIAGNOSIS — S3289XD Fracture of other parts of pelvis, subsequent encounter for fracture with routine healing: Secondary | ICD-10-CM | POA: Diagnosis not present

## 2024-01-15 DIAGNOSIS — R279 Unspecified lack of coordination: Secondary | ICD-10-CM | POA: Diagnosis not present

## 2024-01-15 DIAGNOSIS — Z96642 Presence of left artificial hip joint: Secondary | ICD-10-CM | POA: Diagnosis not present

## 2024-01-15 DIAGNOSIS — I10 Essential (primary) hypertension: Secondary | ICD-10-CM | POA: Diagnosis not present

## 2024-01-15 DIAGNOSIS — M6281 Muscle weakness (generalized): Secondary | ICD-10-CM | POA: Diagnosis not present

## 2024-01-15 DIAGNOSIS — S52302D Unspecified fracture of shaft of left radius, subsequent encounter for closed fracture with routine healing: Secondary | ICD-10-CM | POA: Diagnosis not present

## 2024-01-15 DIAGNOSIS — S32313G Displaced avulsion fracture of unspecified ilium, subsequent encounter for fracture with delayed healing: Secondary | ICD-10-CM | POA: Diagnosis not present

## 2024-01-15 DIAGNOSIS — D45 Polycythemia vera: Secondary | ICD-10-CM | POA: Diagnosis not present

## 2024-01-15 NOTE — ED Provider Notes (Signed)
Emergency Medicine Observation Re-evaluation Note  [name] is a 88 y.o. female, seen on rounds today.  Pt initially presented to the ED for complaints of Fall Currently, the patient is sleeping.  Physical Exam  BP 136/66   Pulse (!) 58   Temp 98.4 F (36.9 C)   Resp 16   Ht 5\' 4"  (1.626 m)   Wt 51.8 kg   SpO2 94%   BMI 19.60 kg/m  Physical Exam General: No acute distress Cardiac: Well-perfused Lungs: Nonlabored Psych: Calm  ED Course / MDM  EKG:   I have reviewed the labs performed to date as well as medications administered while in observation.  Recent changes in the last 24 hours include social work working on placement.  Plan  Current plan is for SNF placement.  11:40 AM.  Informed by social work that patient has been accepted to [facility].   Terrilee Files, MD 01/15/24 [phone]

## 2024-01-15 NOTE — ED Notes (Signed)
Report given to and accepted by nurse at [facility].

## 2024-01-15 NOTE — ED Notes (Signed)
Attempted to call report. No answer.

## 2024-01-15 NOTE — ED Notes (Signed)
2nd failed attempt to call report.

## 2024-01-15 NOTE — ED Notes (Addendum)
CSW is awaiting for [facility] to call CSW back regarding accepting pt today and room number. TOC will continue to follow  Debbie with [facility] called CSW with room number (A11-1) and call for report [phone]. CSW made nurse and MD aware. Daughter was also notified about pt being ready to DC to facility today. TOC signing off.

## 2024-01-16 DIAGNOSIS — E559 Vitamin D deficiency, unspecified: Secondary | ICD-10-CM | POA: Diagnosis not present

## 2024-01-16 DIAGNOSIS — S32313G Displaced avulsion fracture of unspecified ilium, subsequent encounter for fracture with delayed healing: Secondary | ICD-10-CM | POA: Diagnosis not present

## 2024-01-16 DIAGNOSIS — R296 Repeated falls: Secondary | ICD-10-CM | POA: Diagnosis not present

## 2024-01-16 DIAGNOSIS — I4891 Unspecified atrial fibrillation: Secondary | ICD-10-CM | POA: Diagnosis not present

## 2024-01-16 DIAGNOSIS — M6281 Muscle weakness (generalized): Secondary | ICD-10-CM | POA: Diagnosis not present

## 2024-01-19 DIAGNOSIS — R296 Repeated falls: Secondary | ICD-10-CM | POA: Diagnosis not present

## 2024-01-19 DIAGNOSIS — S32313G Displaced avulsion fracture of unspecified ilium, subsequent encounter for fracture with delayed healing: Secondary | ICD-10-CM | POA: Diagnosis not present

## 2024-01-19 DIAGNOSIS — E559 Vitamin D deficiency, unspecified: Secondary | ICD-10-CM | POA: Diagnosis not present

## 2024-01-19 DIAGNOSIS — I4891 Unspecified atrial fibrillation: Secondary | ICD-10-CM | POA: Diagnosis not present

## 2024-01-19 DIAGNOSIS — M6281 Muscle weakness (generalized): Secondary | ICD-10-CM | POA: Diagnosis not present

## 2024-01-21 DIAGNOSIS — Z96642 Presence of left artificial hip joint: Secondary | ICD-10-CM | POA: Diagnosis not present

## 2024-01-21 DIAGNOSIS — E559 Vitamin D deficiency, unspecified: Secondary | ICD-10-CM | POA: Diagnosis not present

## 2024-01-21 DIAGNOSIS — M6281 Muscle weakness (generalized): Secondary | ICD-10-CM | POA: Diagnosis not present

## 2024-01-21 DIAGNOSIS — I10 Essential (primary) hypertension: Secondary | ICD-10-CM | POA: Diagnosis not present

## 2024-01-21 DIAGNOSIS — S52302D Unspecified fracture of shaft of left radius, subsequent encounter for closed fracture with routine healing: Secondary | ICD-10-CM | POA: Diagnosis not present

## 2024-01-21 DIAGNOSIS — I4891 Unspecified atrial fibrillation: Secondary | ICD-10-CM | POA: Diagnosis not present

## 2024-01-21 DIAGNOSIS — R279 Unspecified lack of coordination: Secondary | ICD-10-CM | POA: Diagnosis not present

## 2024-01-21 DIAGNOSIS — D45 Polycythemia vera: Secondary | ICD-10-CM | POA: Diagnosis not present

## 2024-01-21 DIAGNOSIS — S32313G Displaced avulsion fracture of unspecified ilium, subsequent encounter for fracture with delayed healing: Secondary | ICD-10-CM | POA: Diagnosis not present

## 2024-01-21 DIAGNOSIS — R296 Repeated falls: Secondary | ICD-10-CM | POA: Diagnosis not present

## 2024-01-22 DIAGNOSIS — M6281 Muscle weakness (generalized): Secondary | ICD-10-CM | POA: Diagnosis not present

## 2024-01-22 DIAGNOSIS — E559 Vitamin D deficiency, unspecified: Secondary | ICD-10-CM | POA: Diagnosis not present

## 2024-01-22 DIAGNOSIS — R296 Repeated falls: Secondary | ICD-10-CM | POA: Diagnosis not present

## 2024-01-22 DIAGNOSIS — S32313G Displaced avulsion fracture of unspecified ilium, subsequent encounter for fracture with delayed healing: Secondary | ICD-10-CM | POA: Diagnosis not present

## 2024-01-22 DIAGNOSIS — I4891 Unspecified atrial fibrillation: Secondary | ICD-10-CM | POA: Diagnosis not present

## 2024-01-23 DIAGNOSIS — I4891 Unspecified atrial fibrillation: Secondary | ICD-10-CM | POA: Diagnosis not present

## 2024-01-23 DIAGNOSIS — M6281 Muscle weakness (generalized): Secondary | ICD-10-CM | POA: Diagnosis not present

## 2024-01-23 DIAGNOSIS — S32313G Displaced avulsion fracture of unspecified ilium, subsequent encounter for fracture with delayed healing: Secondary | ICD-10-CM | POA: Diagnosis not present

## 2024-01-23 DIAGNOSIS — R296 Repeated falls: Secondary | ICD-10-CM | POA: Diagnosis not present

## 2024-01-23 DIAGNOSIS — E559 Vitamin D deficiency, unspecified: Secondary | ICD-10-CM | POA: Diagnosis not present

## 2024-01-26 DIAGNOSIS — S32313G Displaced avulsion fracture of unspecified ilium, subsequent encounter for fracture with delayed healing: Secondary | ICD-10-CM | POA: Diagnosis not present

## 2024-01-26 DIAGNOSIS — E559 Vitamin D deficiency, unspecified: Secondary | ICD-10-CM | POA: Diagnosis not present

## 2024-01-26 DIAGNOSIS — M6281 Muscle weakness (generalized): Secondary | ICD-10-CM | POA: Diagnosis not present

## 2024-01-26 DIAGNOSIS — I4891 Unspecified atrial fibrillation: Secondary | ICD-10-CM | POA: Diagnosis not present

## 2024-01-26 DIAGNOSIS — R296 Repeated falls: Secondary | ICD-10-CM | POA: Diagnosis not present

## 2024-01-28 DIAGNOSIS — R296 Repeated falls: Secondary | ICD-10-CM | POA: Diagnosis not present

## 2024-01-28 DIAGNOSIS — I4891 Unspecified atrial fibrillation: Secondary | ICD-10-CM | POA: Diagnosis not present

## 2024-01-28 DIAGNOSIS — E559 Vitamin D deficiency, unspecified: Secondary | ICD-10-CM | POA: Diagnosis not present

## 2024-01-28 DIAGNOSIS — S32313G Displaced avulsion fracture of unspecified ilium, subsequent encounter for fracture with delayed healing: Secondary | ICD-10-CM | POA: Diagnosis not present

## 2024-01-28 DIAGNOSIS — M6281 Muscle weakness (generalized): Secondary | ICD-10-CM | POA: Diagnosis not present

## 2024-01-30 DIAGNOSIS — M6281 Muscle weakness (generalized): Secondary | ICD-10-CM | POA: Diagnosis not present

## 2024-01-30 DIAGNOSIS — E559 Vitamin D deficiency, unspecified: Secondary | ICD-10-CM | POA: Diagnosis not present

## 2024-01-30 DIAGNOSIS — I4891 Unspecified atrial fibrillation: Secondary | ICD-10-CM | POA: Diagnosis not present

## 2024-01-30 DIAGNOSIS — R296 Repeated falls: Secondary | ICD-10-CM | POA: Diagnosis not present

## 2024-01-30 DIAGNOSIS — S32313G Displaced avulsion fracture of unspecified ilium, subsequent encounter for fracture with delayed healing: Secondary | ICD-10-CM | POA: Diagnosis not present

## 2024-04-12 ENCOUNTER — Ambulatory Visit: Admitting: Obstetrics & Gynecology

## 2024-04-12 ENCOUNTER — Encounter: Payer: Self-pay | Admitting: Obstetrics & Gynecology

## 2024-04-12 VITALS — BP 183/69 | HR 74 | Ht 64.0 in | Wt 106.5 lb

## 2024-04-12 DIAGNOSIS — N813 Complete uterovaginal prolapse: Secondary | ICD-10-CM

## 2024-04-12 DIAGNOSIS — Z4689 Encounter for fitting and adjustment of other specified devices: Secondary | ICD-10-CM

## 2024-04-12 NOTE — Progress Notes (Signed)
 Chief Complaint  Patient presents with   Pessary Check    Blood pressure (!) 172/69, pulse 73, height 5\' 4"  (1.626 m), weight 106 lb 8 oz (48.3 kg).  [name] presents today for routine follow up related to her pessary.   She uses a Milex ring with support #2 She reports no vaginal discharge and no vaginal bleeding   Likert scale(1 not bothersome -5 very bothersome)  :  1  Exam reveals no undue vaginal mucosal pressure of breakdown, no discharge and no vaginal bleeding.  Vaginal Epithelial Abnormality Classification System:   0 0    No abnormalities 1    Epithelial erythema 2    Granulation tissue 3    Epithelial break or erosion, 1 cm or less 4    Epithelial break or erosion, 1 cm or greater  The pessary is removed, cleaned and replaced without difficulty.      ICD-10-CM   1. Pessary maintenance, Milex ring with support #2  Z46.89     2. Uterovaginal prolapse, complete  N81.3        Diamon S Lindh will be sen back in 4 months for continued follow up.  Wendelyn Halter, MD  04/12/2024 3:46 PM

## 2024-04-28 ENCOUNTER — Other Ambulatory Visit: Payer: Self-pay

## 2024-04-28 DIAGNOSIS — D45 Polycythemia vera: Secondary | ICD-10-CM

## 2024-04-28 DIAGNOSIS — E559 Vitamin D deficiency, unspecified: Secondary | ICD-10-CM

## 2024-04-29 ENCOUNTER — Inpatient Hospital Stay: Payer: Medicare Other | Attending: Hematology

## 2024-04-29 DIAGNOSIS — D649 Anemia, unspecified: Secondary | ICD-10-CM | POA: Insufficient documentation

## 2024-04-29 DIAGNOSIS — R296 Repeated falls: Secondary | ICD-10-CM | POA: Diagnosis not present

## 2024-04-29 DIAGNOSIS — B351 Tinea unguium: Secondary | ICD-10-CM | POA: Diagnosis not present

## 2024-04-29 DIAGNOSIS — D45 Polycythemia vera: Secondary | ICD-10-CM | POA: Diagnosis not present

## 2024-04-29 DIAGNOSIS — Z801 Family history of malignant neoplasm of trachea, bronchus and lung: Secondary | ICD-10-CM | POA: Diagnosis not present

## 2024-04-29 DIAGNOSIS — Z7982 Long term (current) use of aspirin: Secondary | ICD-10-CM | POA: Diagnosis not present

## 2024-04-29 DIAGNOSIS — Z8042 Family history of malignant neoplasm of prostate: Secondary | ICD-10-CM | POA: Diagnosis not present

## 2024-04-29 DIAGNOSIS — E559 Vitamin D deficiency, unspecified: Secondary | ICD-10-CM | POA: Insufficient documentation

## 2024-04-29 LAB — CBC WITH DIFFERENTIAL/PLATELET
Abs Immature Granulocytes: 0.07 10*3/uL (ref 0.00–0.07)
Basophils Absolute: 0.3 10*3/uL — ABNORMAL HIGH (ref 0.0–0.1)
Basophils Relative: 4 %
Eosinophils Absolute: 0.2 10*3/uL (ref 0.0–0.5)
Eosinophils Relative: 3 %
HCT: 39.1 % (ref 36.0–46.0)
Hemoglobin: 11.4 g/dL — ABNORMAL LOW (ref 12.0–15.0)
Immature Granulocytes: 1 %
Lymphocytes Relative: 13 %
Lymphs Abs: 0.8 10*3/uL (ref 0.7–4.0)
MCH: 26.7 pg (ref 26.0–34.0)
MCHC: 29.2 g/dL — ABNORMAL LOW (ref 30.0–36.0)
MCV: 91.6 fL (ref 80.0–100.0)
Monocytes Absolute: 0.4 10*3/uL (ref 0.1–1.0)
Monocytes Relative: 6 %
Neutro Abs: 4.8 10*3/uL (ref 1.7–7.7)
Neutrophils Relative %: 73 %
Platelets: 171 10*3/uL (ref 150–400)
RBC: 4.27 MIL/uL (ref 3.87–5.11)
RDW: 21.4 % — ABNORMAL HIGH (ref 11.5–15.5)
WBC: 6.5 10*3/uL (ref 4.0–10.5)
nRBC: 0 % (ref 0.0–0.2)

## 2024-04-29 LAB — COMPREHENSIVE METABOLIC PANEL WITH GFR
ALT: 16 U/L (ref 0–44)
AST: 23 U/L (ref 15–41)
Albumin: 3.8 g/dL (ref 3.5–5.0)
Alkaline Phosphatase: 104 U/L (ref 38–126)
Anion gap: 9 (ref 5–15)
BUN: 13 mg/dL (ref 8–23)
CO2: 24 mmol/L (ref 22–32)
Calcium: 8.5 mg/dL — ABNORMAL LOW (ref 8.9–10.3)
Chloride: 102 mmol/L (ref 98–111)
Creatinine, Ser: 0.68 mg/dL (ref 0.44–1.00)
GFR, Estimated: >60 mL/min (ref >60)
Glucose, Bld: 92 mg/dL (ref 70–99)
Potassium: 3.7 mmol/L (ref 3.5–5.1)
Sodium: 135 mmol/L (ref 135–145)
Total Bilirubin: 1 mg/dL (ref 0.0–1.2)
Total Protein: 7.1 g/dL (ref 6.5–8.1)

## 2024-04-29 LAB — LACTATE DEHYDROGENASE: LDH: 169 U/L (ref 98–192)

## 2024-04-29 LAB — VITAMIN D 25 HYDROXY (VIT D DEFICIENCY, FRACTURES): Vit D, 25-Hydroxy: 99.7 ng/mL (ref 30–100)

## 2024-05-05 DIAGNOSIS — L218 Other seborrheic dermatitis: Secondary | ICD-10-CM | POA: Diagnosis not present

## 2024-05-05 DIAGNOSIS — I872 Venous insufficiency (chronic) (peripheral): Secondary | ICD-10-CM | POA: Diagnosis not present

## 2024-05-05 DIAGNOSIS — Z85828 Personal history of other malignant neoplasm of skin: Secondary | ICD-10-CM | POA: Diagnosis not present

## 2024-05-05 DIAGNOSIS — L821 Other seborrheic keratosis: Secondary | ICD-10-CM | POA: Diagnosis not present

## 2024-05-05 DIAGNOSIS — L820 Inflamed seborrheic keratosis: Secondary | ICD-10-CM | POA: Diagnosis not present

## 2024-05-05 DIAGNOSIS — Z08 Encounter for follow-up examination after completed treatment for malignant neoplasm: Secondary | ICD-10-CM | POA: Diagnosis not present

## 2024-05-06 ENCOUNTER — Inpatient Hospital Stay (HOSPITAL_BASED_OUTPATIENT_CLINIC_OR_DEPARTMENT_OTHER): Payer: Medicare Other | Admitting: Oncology

## 2024-05-06 VITALS — BP 131/60 | HR 58 | Temp 99.0°F | Resp 20 | Wt 106.0 lb

## 2024-05-06 DIAGNOSIS — D649 Anemia, unspecified: Secondary | ICD-10-CM | POA: Diagnosis not present

## 2024-05-06 DIAGNOSIS — Z7982 Long term (current) use of aspirin: Secondary | ICD-10-CM | POA: Diagnosis not present

## 2024-05-06 DIAGNOSIS — D45 Polycythemia vera: Secondary | ICD-10-CM | POA: Diagnosis not present

## 2024-05-06 DIAGNOSIS — Z801 Family history of malignant neoplasm of trachea, bronchus and lung: Secondary | ICD-10-CM | POA: Diagnosis not present

## 2024-05-06 DIAGNOSIS — B351 Tinea unguium: Secondary | ICD-10-CM | POA: Diagnosis not present

## 2024-05-06 DIAGNOSIS — E559 Vitamin D deficiency, unspecified: Secondary | ICD-10-CM

## 2024-05-06 DIAGNOSIS — R296 Repeated falls: Secondary | ICD-10-CM | POA: Diagnosis not present

## 2024-05-06 NOTE — Progress Notes (Signed)
 [facility] [address], [place] [address]   CLINIC:  Medical Oncology/Hematology  PCP:  Minus Amel, MD [address] [place] [address] [phone]   REASON FOR VISIT:  Follow-up for JAK2 positive polycythemia vera   PRIOR THERAPY: None  CURRENT THERAPY: Hydrea     [name] 88 y.o. female returns for routine follow-up of JAK2 positive polycythemia vera.  She was last seen by me on 10/30/2023.  Patient unfortunately fell in January 2025 sustaining pelvic fractures ultimately discharged to a SNF [facility].  She was discharged home mid February after 2 to 3 weeks of physical therapy.  She currently is using a cane for ambulation.  She continues to take Hydrea  500 mg Monday through Friday, and is tolerating it well.  She denies any abnormal fatigue, mouth sores, nonhealing skin sores, or GI side effects.  She takes aspirin  81 mg daily.   No history of DVT, PE, MI, or CVA.  She denies any aquagenic pruritus, Raynaud's, erythromelalgia, or other vasomotor symptoms.  She denies any B-symptoms, abdominal pain, nausea, or early satiety.  She reports her weight is stable.  Appetite is 100% and energy levels are 85%.  She denies any pain.  Reports she is taking her vitamin D  supplements every other week.  Denies any bleeding.    ASSESSMENT   1.  JAK2 positive polycythemia vera - Initially diagnosed in August 2005.  Reports that she has been on hydroxyurea  for 15+ years -- She did not have bone marrow biopsy - Denies any prior history of DVT, PE, MI, or CVA  - Current dose of Hydrea  is 500 mg daily Monday through Friday. - She is tolerating hydroxyurea  well. - Denies any aquagenic pruritus or vasomotor symptoms. - No recent infections or B symptoms.      2.  Vitamin D  deficiency - Currently taking vitamin D  every other week.   - Most recent vitamin D  (10/08/2022) normal at 75.03  PLAN: 1. Polycythemia vera (HCC) - Most recent labs  (04/19/24): Hgb 11.4/MCV 91.6, platelets 171, WBC.6.5  Normal LDH.  CMP unremarkable. - Continue Hydrea  500 mg daily Monday through Friday.  Repeat labs in 6 months with RTC 1 week after.   2. Vitamin D  deficiency -Continue taking vit d every other week.  -Vitamin D  level is 99.70.  3.  Onychomycosis: -She is followed by podiatry.  Symptoms appear to have improved.  4.  ORIF left forearm fracture: -Status post ORIF on 12/27/2022. -Continues to wear brace when out of her home and at bedtime.  Having trouble fully pronating. -She completed 6 weeks of PT.  5.  Frequent falls: -She fell in late January and fractured her pelvis. -She spent about 3 weeks at [facility] in rehab. -Reports she continues to use and for ambulation but overall feels back to baseline.  6.  Anemia: -She has had intermittent anemia since April 2023. -Labs from 04/29/2024 show hemoglobin 11.4. -Will add on nutritional workup in 6 months to rule out other reasons for her anemia along with SPEP/IFE.    PLAN SUMMARY: >> Continue Hydrea  Monday through Friday. >> Decrease vitamin D  to once per month. >> Recommend follow-up in 6 months with labs a few days before.      REVIEW OF SYSTEMS:   Review of Systems  Musculoskeletal:  Positive for arthralgias (Left wrist and right hip).     PHYSICAL EXAM:  ECOG PERFORMANCE STATUS: 0 - Asymptomatic  There were no vitals filed  for this visit. There were no vitals filed for this visit.  Physical Exam Constitutional:      Appearance: Normal appearance. She is normal weight.  Cardiovascular:     Heart sounds: Normal heart sounds.  Pulmonary:     Breath sounds: Normal breath sounds.  Musculoskeletal:     Comments: Left wrist splint Cane for ambulation   Neurological:     General: No focal deficit present.     Mental Status: Mental status is at baseline.  Psychiatric:        Behavior: Behavior normal. Behavior is cooperative.    PAST MEDICAL/SURGICAL HISTORY:   Past Medical History:  Diagnosis Date   Hypertension    Influenza 11/2012   Osteoporosis    Polycythemia    Polycythemia vera(238.4) 02/25/2013   Diagnosed August 2005 on Hydrea  500 mg 3 times a week with excellent control.    Prolapse, uterus, congenital    ring placement   Past Surgical History:  Procedure Laterality Date   APPENDECTOMY     COLONOSCOPY     INCISION AND DRAINAGE Left 01/21/2022   Procedure: INCISION AND DRAINAGE LEFT INDEX FINGER;  Surgeon: Brunilda Capra, MD;  Location: [facility];  Service: Orthopedics;  Laterality: Left;   OOPHORECTOMY     left   opperectomy     ORIF RADIAL FRACTURE Left 12/27/2022   Procedure: OPEN REDUCTION INTERNAL FIXATION (ORIF) RADIAL and ULNAR FRACTURE;  Surgeon: Darrin Emerald, MD;  Location: [facility];  Service: Orthopedics;  Laterality: Left;   REVISION TOTAL HIP ARTHROPLASTY Left     SOCIAL HISTORY:  Social History   Socioeconomic History   Marital status: Divorced    Spouse name: Not on file   Number of children: 1   Years of education: Not on file   Highest education level: Not on file  Occupational History   Not on file  Tobacco Use   Smoking status: Never   Smokeless tobacco: Never  Vaping Use   Vaping status: Never Used  Substance and Sexual Activity   Alcohol use: No   Drug use: No   Sexual activity: Yes    Birth control/protection: Post-menopausal  Other Topics Concern   Not on file  Social History Narrative   Not on file   Social Drivers of Health   Financial Resource Strain: Not on file  Food Insecurity: Not on file  Transportation Needs: Not on file  Physical Activity: Not on file  Stress: Not on file  Social Connections: Not on file  Intimate Partner Violence: Not on file    FAMILY HISTORY:  Family History  Problem Relation Age of Onset   Cancer Brother        throat cancer   Cancer Brother        lung cancer   Cancer Brother        prostate cancer    CURRENT  MEDICATIONS:  Outpatient Encounter Medications as of 05/06/2024  Medication Sig   aspirin  81 MG tablet Take 81 mg by mouth daily.   diltiazem  (CARDIZEM  CD) 180 MG 24 hr capsule Take 180 mg by mouth daily.    hydroxyurea  (HYDREA ) 500 MG capsule May take with food to minimize GI side effects. (Patient taking differently: Take 500 mg by mouth daily. May take with food to minimize GI side effects. Monday-Friday)   mometasone (NASONEX) 50 MCG/ACT nasal spray INSTILL 2 SRPAYS INTO EACH NOSTRIL ONCE DAILY   Vitamin D , Ergocalciferol , (DRISDOL ) 1.25 MG (50000 UNIT)  CAPS capsule Take 1 capsule (50,000 Units total) by mouth every 14 (fourteen) days.   No facility-administered encounter medications on file as of 05/06/2024.    ALLERGIES:  Allergies  Allergen Reactions   Codeine     Nausea and vomiting   Diovan [Valsartan]     Rash and itching    Lamisil [Terbinafine] Swelling   Penicillins     Rash and "breaking out in whelps"     LABORATORY DATA:  I have reviewed the labs as listed.  CBC    Component Value Date/Time   WBC 6.5 04/29/2024 1307   RBC 4.27 04/29/2024 1307   HGB 11.4 (L) 04/29/2024 1307   HCT 39.1 04/29/2024 1307   PLT 171 04/29/2024 1307   MCV 91.6 04/29/2024 1307   MCH 26.7 04/29/2024 1307   MCHC 29.2 (L) 04/29/2024 1307   RDW 21.4 (H) 04/29/2024 1307   LYMPHSABS 0.8 04/29/2024 1307   MONOABS 0.4 04/29/2024 1307   EOSABS 0.2 04/29/2024 1307   BASOSABS 0.3 (H) 04/29/2024 1307      Latest Ref Rng & Units 04/29/2024    1:07 PM 01/11/2024    7:30 PM 10/23/2023    2:52 PM  CMP  Glucose 70 - 99 mg/dL 92  161  096   BUN 8 - 23 mg/dL 13  16  14    Creatinine 0.44 - 1.00 mg/dL 0.45  4.09  8.11   Sodium 135 - 145 mmol/L 135  135  135   Potassium 3.5 - 5.1 mmol/L 3.7  4.1  3.9   Chloride 98 - 111 mmol/L 102  102  101   CO2 22 - 32 mmol/L 24  23  24    Calcium 8.9 - 10.3 mg/dL 8.5  8.3  8.7   Total Protein 6.5 - 8.1 g/dL 7.1   7.2   Total Bilirubin 0.0 - 1.2 mg/dL 1.0    0.8   Alkaline Phos 38 - 126 U/L 104   103   AST 15 - 41 U/L 23   22   ALT 0 - 44 U/L 16   16     DIAGNOSTIC IMAGING:  I have independently reviewed the relevant imaging and discussed with the patient.   WRAP UP:  All questions were answered. The patient knows to call the clinic with any problems, questions or concerns.  Medical decision making: Moderate  Time spent on visit: I spent 25 minutes dedicated to the care of this patient (face-to-face and non-face-to-face) on the date of the encounter to include what is described in the assessment and plan.  Aurther Blue, NP  05/06/24 1:34 PM

## 2024-05-11 ENCOUNTER — Other Ambulatory Visit: Payer: Self-pay | Admitting: *Deleted

## 2024-05-11 DIAGNOSIS — D45 Polycythemia vera: Secondary | ICD-10-CM

## 2024-05-11 MED ORDER — HYDROXYUREA 500 MG PO CAPS: ORAL_CAPSULE | ORAL | 0 refills | Status: DC

## 2024-05-27 DIAGNOSIS — M810 Age-related osteoporosis without current pathological fracture: Secondary | ICD-10-CM | POA: Diagnosis not present

## 2024-05-27 DIAGNOSIS — Z9229 Personal history of other drug therapy: Secondary | ICD-10-CM | POA: Diagnosis not present

## 2024-05-27 DIAGNOSIS — Z681 Body mass index (BMI) 19 or less, adult: Secondary | ICD-10-CM | POA: Diagnosis not present

## 2024-05-27 DIAGNOSIS — M1991 Primary osteoarthritis, unspecified site: Secondary | ICD-10-CM | POA: Diagnosis not present

## 2024-05-27 DIAGNOSIS — D45 Polycythemia vera: Secondary | ICD-10-CM | POA: Diagnosis not present

## 2024-05-27 DIAGNOSIS — Z0001 Encounter for general adult medical examination with abnormal findings: Secondary | ICD-10-CM | POA: Diagnosis not present

## 2024-05-27 DIAGNOSIS — I10 Essential (primary) hypertension: Secondary | ICD-10-CM | POA: Diagnosis not present

## 2024-05-31 ENCOUNTER — Other Ambulatory Visit (HOSPITAL_COMMUNITY): Payer: Self-pay | Admitting: Internal Medicine

## 2024-05-31 DIAGNOSIS — M810 Age-related osteoporosis without current pathological fracture: Secondary | ICD-10-CM

## 2024-06-07 ENCOUNTER — Other Ambulatory Visit: Payer: Self-pay

## 2024-06-07 DIAGNOSIS — E559 Vitamin D deficiency, unspecified: Secondary | ICD-10-CM

## 2024-06-07 DIAGNOSIS — D649 Anemia, unspecified: Secondary | ICD-10-CM

## 2024-06-07 DIAGNOSIS — D45 Polycythemia vera: Secondary | ICD-10-CM

## 2024-06-07 MED ORDER — HYDROXYUREA 500 MG PO CAPS: ORAL_CAPSULE | ORAL | 1 refills | Status: DC

## 2024-06-10 DIAGNOSIS — I872 Venous insufficiency (chronic) (peripheral): Secondary | ICD-10-CM | POA: Diagnosis not present

## 2024-07-16 DIAGNOSIS — E039 Hypothyroidism, unspecified: Secondary | ICD-10-CM | POA: Diagnosis not present

## 2024-08-06 ENCOUNTER — Encounter: Payer: Self-pay | Admitting: Radiology

## 2024-08-10 ENCOUNTER — Ambulatory Visit: Admitting: Obstetrics & Gynecology

## 2024-08-10 ENCOUNTER — Encounter: Payer: Self-pay | Admitting: Obstetrics & Gynecology

## 2024-08-10 VITALS — BP 170/71 | HR 80

## 2024-08-10 DIAGNOSIS — Z4689 Encounter for fitting and adjustment of other specified devices: Secondary | ICD-10-CM | POA: Diagnosis not present

## 2024-08-10 DIAGNOSIS — N813 Complete uterovaginal prolapse: Secondary | ICD-10-CM

## 2024-08-10 NOTE — Progress Notes (Signed)
 Chief Complaint  Patient presents with   Pessary Check    Blood pressure (!) 170/71, pulse 80.  [name] presents today for routine follow up related to her pessary.   She uses a Milex ring with support #2 She reports no vaginal discharge and no vaginal bleeding   Likert scale(1 not bothersome -5 very bothersome)  :  1  Exam reveals no undue vaginal mucosal pressure of breakdown, no discharge and no vaginal bleeding.  Vaginal Epithelial Abnormality Classification System:   0 0    No abnormalities 1    Epithelial erythema 2    Granulation tissue 3    Epithelial break or erosion, 1 cm or less 4    Epithelial break or erosion, 1 cm or greater  The pessary is removed, cleaned and replaced without difficulty.      ICD-10-CM   1. Pessary maintenance, Milex ring with support #2  Z46.89     2. Uterovaginal prolapse, complete  N81.3        [name] will be sen back in 4 months for continued follow up.  Vonn VEAR Inch, MD  08/10/2024 2:50 PM

## 2024-08-12 ENCOUNTER — Ambulatory Visit: Admitting: Obstetrics & Gynecology

## 2024-08-19 DIAGNOSIS — E039 Hypothyroidism, unspecified: Secondary | ICD-10-CM | POA: Diagnosis not present

## 2024-08-23 ENCOUNTER — Ambulatory Visit (INDEPENDENT_AMBULATORY_CARE_PROVIDER_SITE_OTHER): Admitting: Physician Assistant

## 2024-08-23 VITALS — BP 120/80 | HR 65 | Temp 97.9°F | Ht 64.0 in | Wt 104.0 lb

## 2024-08-23 DIAGNOSIS — E039 Hypothyroidism, unspecified: Secondary | ICD-10-CM | POA: Diagnosis not present

## 2024-08-23 DIAGNOSIS — E559 Vitamin D deficiency, unspecified: Secondary | ICD-10-CM | POA: Insufficient documentation

## 2024-08-23 DIAGNOSIS — Z23 Encounter for immunization: Secondary | ICD-10-CM

## 2024-08-23 DIAGNOSIS — Z7689 Persons encountering health services in other specified circumstances: Secondary | ICD-10-CM

## 2024-08-23 DIAGNOSIS — F411 Generalized anxiety disorder: Secondary | ICD-10-CM

## 2024-08-23 DIAGNOSIS — I10 Essential (primary) hypertension: Secondary | ICD-10-CM | POA: Insufficient documentation

## 2024-08-23 DIAGNOSIS — D45 Polycythemia vera: Secondary | ICD-10-CM

## 2024-08-23 DIAGNOSIS — F410 Panic disorder [episodic paroxysmal anxiety] without agoraphobia: Secondary | ICD-10-CM | POA: Insufficient documentation

## 2024-08-23 MED ORDER — VITAMIN D (ERGOCALCIFEROL) 1.25 MG (50000 UNIT) PO CAPS: 50000.0000 [IU] | ORAL_CAPSULE | ORAL | 3 refills | Status: DC

## 2024-08-23 MED ORDER — HYDROXYZINE PAMOATE 25 MG PO CAPS: 25.0000 mg | ORAL_CAPSULE | Freq: Three times a day (TID) | ORAL | 0 refills | Status: DC | PRN

## 2024-08-23 MED ORDER — DILTIAZEM HCL ER COATED BEADS 180 MG PO CP24: 180.0000 mg | ORAL_CAPSULE | Freq: Every day | ORAL | 1 refills | Status: DC

## 2024-08-23 NOTE — Progress Notes (Signed)
 New Patient Office Visit  Subjective    Patient ID: [name], female    DOB: 1936/12/15  Age: 88 y.o. MRN: [id number]  CC:  No chief complaint on file.   HPI [name] presents to establish care  Discussed the use of AI scribe software for clinical note transcription with the patient, who gave verbal consent to proceed.  History of Present Illness [name] is an 88 year old female, accompanied by her daughter, who presents for medication refills and thyroid  function evaluation.  She was last seen in June at [place] by her previous PCP, where she had blood work done and was started on thyroid  medication. Her thyroid  medication dose was increased from 25 mcg to 50 mcg after a follow-up appointment. She is due for a refill of her thyroid  medication.  She has polycythemia vera and has been taking hydroxyurea  for the past 20 years. She follows up at the cancer center every six months for blood work and evaluation.  She is on Cardizem  reportedly for blood pressure and heart rate management. Previously, she was on Norvasc, but it was discontinued. She experiences episodes of high blood pressure and heart rate, often associated with anxiety and panic attacks. Daughter is interested in an as needed medication for management of these symptoms.   She experienced a significant fall in January 2024, resulting in a fractured arm with two plates and fifteen screws, and a fractured pelvis in January 2025. She underwent surgery and therapy for her arm and has some limitations in wrist movement but has regained functional use of her hand. No current concerns or complaints.   Outpatient Encounter Medications as of 08/23/2024  Medication Sig   aspirin  81 MG tablet Take 81 mg by mouth daily.   augmented betamethasone dipropionate (DIPROLENE-AF) 0.05 % cream Apply topically.   hydroxyurea  (HYDREA ) 500 MG capsule May take with food to minimize GI side effects on Monday - Friday only    hydrOXYzine  (VISTARIL ) 25 MG capsule Take 1 capsule (25 mg total) by mouth every 8 (eight) hours as needed.   levothyroxine  (SYNTHROID ) 50 MCG tablet Take 50 mcg by mouth daily before breakfast.   mometasone (NASONEX) 50 MCG/ACT nasal spray INSTILL 2 SRPAYS INTO EACH NOSTRIL ONCE DAILY   [DISCONTINUED] diltiazem  (CARDIZEM  CD) 180 MG 24 hr capsule Take 180 mg by mouth daily.    [DISCONTINUED] Vitamin D , Ergocalciferol , (DRISDOL ) 1.25 MG (50000 UNIT) CAPS capsule Take 1 capsule (50,000 Units total) by mouth every 14 (fourteen) days.   diltiazem  (CARDIZEM  CD) 180 MG 24 hr capsule Take 1 capsule (180 mg total) by mouth daily.   Vitamin D , Ergocalciferol , (DRISDOL ) 1.25 MG (50000 UNIT) CAPS capsule Take 1 capsule (50,000 Units total) by mouth every 14 (fourteen) days.   No facility-administered encounter medications on file as of 08/23/2024.    Past Medical History:  Diagnosis Date   Hypertension    Influenza 11/2012   Osteoporosis    Polycythemia    Polycythemia vera(238.4) 02/25/2013   Diagnosed August 2005 on Hydrea  500 mg 3 times a week with excellent control.    Prolapse, uterus, congenital    ring placement    Past Surgical History:  Procedure Laterality Date   APPENDECTOMY     COLONOSCOPY     INCISION AND DRAINAGE Left 01/21/2022   Procedure: INCISION AND DRAINAGE LEFT INDEX FINGER;  Surgeon: Murrell Drivers, MD;  Location: [facility];  Service: Orthopedics;  Laterality: Left;   OOPHORECTOMY  left   opperectomy     ORIF RADIAL FRACTURE Left 12/27/2022   Procedure: OPEN REDUCTION INTERNAL FIXATION (ORIF) RADIAL and ULNAR FRACTURE;  Surgeon: Margrette Taft BRAVO, MD;  Location: [facility];  Service: Orthopedics;  Laterality: Left;   REVISION TOTAL HIP ARTHROPLASTY Left     Family History  Problem Relation Age of Onset   Cancer Brother        throat cancer   Cancer Brother        lung cancer   Cancer Brother        prostate cancer    Social History    Socioeconomic History   Marital status: Divorced    Spouse name: Not on file   Number of children: 1   Years of education: Not on file   Highest education level: 11th grade  Occupational History   Not on file  Tobacco Use   Smoking status: Never   Smokeless tobacco: Never  Vaping Use   Vaping status: Never Used  Substance and Sexual Activity   Alcohol use: No   Drug use: No   Sexual activity: Yes    Birth control/protection: Post-menopausal  Other Topics Concern   Not on file  Social History Narrative   Not on file   Social Drivers of Health   Financial Resource Strain: Not on file  Food Insecurity: Unknown (08/23/2024)   Hunger Vital Sign    Worried About Running Out of Food in the Last Year: Never true    Ran Out of Food in the Last Year: Not on file  Transportation Needs: No Transportation Needs (08/23/2024)   PRAPARE - Administrator, Civil Service (Medical): No    Lack of Transportation (Non-Medical): No  Physical Activity: Insufficiently Active (08/23/2024)   Exercise Vital Sign    Days of Exercise per Week: 4 days    Minutes of Exercise per Session: 30 min  Stress: Stress Concern Present (08/23/2024)   [employer] of Occupational Health - Occupational Stress Questionnaire    Feeling of Stress: To some extent  Social Connections: Socially Isolated (08/23/2024)   Social Connection and Isolation Panel    Frequency of Communication with Friends and Family: More than three times a week    Frequency of Social Gatherings with Friends and Family: Once a week    Attends Religious Services: Never    Database administrator or Organizations: No    Attends Engineer, structural: Not on file    Marital Status: Divorced  Intimate Partner Violence: Not on file    Review of Systems  Constitutional:  Negative for chills, fever and malaise/fatigue.  Eyes:  Negative for blurred vision and double vision.  Respiratory:  Negative for cough and shortness of  breath.   Cardiovascular:  Negative for chest pain and palpitations.  Neurological:  Negative for dizziness and headaches.        Objective    BP 120/80   Pulse 65   Temp 97.9 F (36.6 C)   Ht 5' 4 (1.626 m)   Wt 104 lb (47.2 kg)   SpO2 98%   BMI 17.85 kg/m   Physical Exam Constitutional:      General: She is not in acute distress.    Appearance: Normal appearance. She is not ill-appearing.  HENT:     Head: Normocephalic and atraumatic.     Mouth/Throat:     Mouth: Mucous membranes are moist.     Pharynx: Oropharynx is clear.  Eyes:     Extraocular Movements: Extraocular movements intact.     Conjunctiva/sclera: Conjunctivae normal.  Cardiovascular:     Rate and Rhythm: Normal rate and regular rhythm.     Heart sounds: Normal heart sounds. No murmur heard. Pulmonary:     Effort: Pulmonary effort is normal.     Breath sounds: Normal breath sounds. No wheezing or rales.  Musculoskeletal:     Right lower leg: No edema.     Left lower leg: No edema.  Skin:    General: Skin is warm and dry.  Neurological:     General: No focal deficit present.     Mental Status: She is alert and oriented to person, place, and time.  Psychiatric:        Mood and Affect: Mood normal.        Behavior: Behavior normal.       Assessment & Plan:  Encounter to establish care  Generalized anxiety disorder with panic attacks Assessment & Plan: Patient presents today with concerns of intermittent anxiety and panic attacks causing elevated blood pressure and tachycardia. Discussed treatment options. Discussed risks and addiction/tolerance potential with benzodiazepine use.  - Prescribe hydroxyzine  as needed for anxiety. - Educate on potential sedative effects of hydroxyzine . - Follow up for worsening symptoms of anxiety or panic attacks. Monitor BP at home.   Orders: -     hydrOXYzine  Pamoate; Take 1 capsule (25 mg total) by mouth every 8 (eight) hours as needed.  Dispense: 30  capsule; Refill: 0  Hypothyroidism, unspecified type Assessment & Plan: Hypothyroidism with recent dose increase to 50 mcg. Requires thyroid  function tests to evaluate medication efficacy. - Order thyroid  function tests. - Refill thyroid  medication after reviewing lab results. - Adjust thyroid  medication dose based on lab results.  Orders: -     TSH + free T4  Polycythemia vera (HCC) Assessment & Plan: Polycythemia vera well-managed with hydroxyurea  and regular follow-ups. - Continue hydroxyurea . - Continue follow-up every six months at the cancer center.   Primary hypertension Assessment & Plan: 120/80 Controlled. Continue current medications. No change in management. Cardizem  refilled today.  Discussed DASH diet and dietary sodium restrictions.     Orders: -     dilTIAZem  HCl ER Coated Beads; Take 1 capsule (180 mg total) by mouth daily.  Dispense: 90 capsule; Refill: 1  Vitamin D  deficiency -     Vitamin D  (Ergocalciferol ); Take 1 capsule (50,000 Units total) by mouth every 14 (fourteen) days.  Dispense: 6 capsule; Refill: 3  Immunization due -     Flu vaccine HIGH DOSE PF(Fluzone Trivalent)    Return in about 6 months (around 02/20/2025).   Charmaine Karianna Gusman, PA-C

## 2024-08-23 NOTE — Assessment & Plan Note (Signed)
 Hypothyroidism with recent dose increase to 50 mcg. Requires thyroid  function tests to evaluate medication efficacy. - Order thyroid  function tests. - Refill thyroid  medication after reviewing lab results. - Adjust thyroid  medication dose based on lab results.

## 2024-08-23 NOTE — Assessment & Plan Note (Signed)
 Polycythemia vera well-managed with hydroxyurea  and regular follow-ups. - Continue hydroxyurea . - Continue follow-up every six months at the cancer center.

## 2024-08-23 NOTE — Assessment & Plan Note (Signed)
 Patient presents today with concerns of intermittent anxiety and panic attacks causing elevated blood pressure and tachycardia. Discussed treatment options. Discussed risks and addiction/tolerance potential with benzodiazepine use.  - Prescribe hydroxyzine  as needed for anxiety. - Educate on potential sedative effects of hydroxyzine . - Follow up for worsening symptoms of anxiety or panic attacks. Monitor BP at home.

## 2024-08-23 NOTE — Assessment & Plan Note (Addendum)
 120/80 Controlled. Continue current medications. No change in management. Cardizem  refilled today.  Discussed DASH diet and dietary sodium restrictions.

## 2024-08-24 ENCOUNTER — Ambulatory Visit: Payer: Self-pay | Admitting: Physician Assistant

## 2024-08-24 ENCOUNTER — Other Ambulatory Visit: Payer: Self-pay | Admitting: Physician Assistant

## 2024-08-24 LAB — TSH+FREE T4
Free T4: 1.47 ng/dL (ref 0.82–1.77)
TSH: 1.97 u[IU]/mL (ref 0.450–4.500)

## 2024-08-24 MED ORDER — LEVOTHYROXINE SODIUM 50 MCG PO TABS: 50.0000 ug | ORAL_TABLET | Freq: Every day | ORAL | 1 refills | Status: DC

## 2024-09-28 ENCOUNTER — Inpatient Hospital Stay (HOSPITAL_COMMUNITY)

## 2024-09-28 ENCOUNTER — Emergency Department (HOSPITAL_COMMUNITY)

## 2024-09-28 ENCOUNTER — Encounter (HOSPITAL_COMMUNITY): Payer: Self-pay | Admitting: Emergency Medicine

## 2024-09-28 ENCOUNTER — Inpatient Hospital Stay (HOSPITAL_COMMUNITY)
Admission: EM | Admit: 2024-09-28 | Discharge: 2024-10-05 | DRG: 481 | Disposition: A | Discharge status: Home Health | Attending: Family Medicine | Admitting: Family Medicine

## 2024-09-28 ENCOUNTER — Other Ambulatory Visit: Payer: Self-pay

## 2024-09-28 DIAGNOSIS — F411 Generalized anxiety disorder: Secondary | ICD-10-CM | POA: Diagnosis present

## 2024-09-28 DIAGNOSIS — F410 Panic disorder [episodic paroxysmal anxiety] without agoraphobia: Secondary | ICD-10-CM | POA: Diagnosis present

## 2024-09-28 DIAGNOSIS — Z555 Less than a high school diploma: Secondary | ICD-10-CM

## 2024-09-28 DIAGNOSIS — Z79890 Hormone replacement therapy (postmenopausal): Secondary | ICD-10-CM

## 2024-09-28 DIAGNOSIS — Z888 Allergy status to other drugs, medicaments and biological substances status: Secondary | ICD-10-CM

## 2024-09-28 DIAGNOSIS — Y92481 Parking lot as the place of occurrence of the external cause: Secondary | ICD-10-CM

## 2024-09-28 DIAGNOSIS — I10 Essential (primary) hypertension: Secondary | ICD-10-CM | POA: Diagnosis present

## 2024-09-28 DIAGNOSIS — S72352D Displaced comminuted fracture of shaft of left femur, subsequent encounter for closed fracture with routine healing: Secondary | ICD-10-CM | POA: Diagnosis not present

## 2024-09-28 DIAGNOSIS — K5900 Constipation, unspecified: Secondary | ICD-10-CM | POA: Diagnosis present

## 2024-09-28 DIAGNOSIS — Z885 Allergy status to narcotic agent status: Secondary | ICD-10-CM

## 2024-09-28 DIAGNOSIS — Z9049 Acquired absence of other specified parts of digestive tract: Secondary | ICD-10-CM | POA: Diagnosis not present

## 2024-09-28 DIAGNOSIS — S72352G Displaced comminuted fracture of shaft of left femur, subsequent encounter for closed fracture with delayed healing: Secondary | ICD-10-CM | POA: Diagnosis not present

## 2024-09-28 DIAGNOSIS — D45 Polycythemia vera: Secondary | ICD-10-CM | POA: Diagnosis present

## 2024-09-28 DIAGNOSIS — M810 Age-related osteoporosis without current pathological fracture: Secondary | ICD-10-CM | POA: Diagnosis present

## 2024-09-28 DIAGNOSIS — Z90721 Acquired absence of ovaries, unilateral: Secondary | ICD-10-CM | POA: Diagnosis not present

## 2024-09-28 DIAGNOSIS — Z96642 Presence of left artificial hip joint: Secondary | ICD-10-CM | POA: Diagnosis present

## 2024-09-28 DIAGNOSIS — Z9181 History of falling: Secondary | ICD-10-CM

## 2024-09-28 DIAGNOSIS — R000 Tachycardia, unspecified: Secondary | ICD-10-CM | POA: Diagnosis not present

## 2024-09-28 DIAGNOSIS — E871 Hypo-osmolality and hyponatremia: Secondary | ICD-10-CM | POA: Diagnosis present

## 2024-09-28 DIAGNOSIS — Z880 Allergy status to penicillin: Secondary | ICD-10-CM

## 2024-09-28 DIAGNOSIS — E039 Hypothyroidism, unspecified: Secondary | ICD-10-CM | POA: Diagnosis present

## 2024-09-28 DIAGNOSIS — I4892 Unspecified atrial flutter: Secondary | ICD-10-CM | POA: Diagnosis present

## 2024-09-28 DIAGNOSIS — S72352A Displaced comminuted fracture of shaft of left femur, initial encounter for closed fracture: Secondary | ICD-10-CM | POA: Diagnosis present

## 2024-09-28 DIAGNOSIS — D62 Acute posthemorrhagic anemia: Secondary | ICD-10-CM | POA: Diagnosis not present

## 2024-09-28 DIAGNOSIS — S72302A Unspecified fracture of shaft of left femur, initial encounter for closed fracture: Secondary | ICD-10-CM | POA: Diagnosis not present

## 2024-09-28 DIAGNOSIS — W010XXA Fall on same level from slipping, tripping and stumbling without subsequent striking against object, initial encounter: Secondary | ICD-10-CM | POA: Diagnosis present

## 2024-09-28 DIAGNOSIS — I480 Paroxysmal atrial fibrillation: Secondary | ICD-10-CM | POA: Diagnosis not present

## 2024-09-28 DIAGNOSIS — Z79899 Other long term (current) drug therapy: Secondary | ICD-10-CM

## 2024-09-28 DIAGNOSIS — G4700 Insomnia, unspecified: Secondary | ICD-10-CM | POA: Diagnosis present

## 2024-09-28 DIAGNOSIS — Z7982 Long term (current) use of aspirin: Secondary | ICD-10-CM | POA: Diagnosis not present

## 2024-09-28 DIAGNOSIS — S72002A Fracture of unspecified part of neck of left femur, initial encounter for closed fracture: Principal | ICD-10-CM

## 2024-09-28 LAB — CBC WITH DIFFERENTIAL/PLATELET
Abs Immature Granulocytes: 0.9 K/uL — ABNORMAL HIGH (ref 0.00–0.07)
Basophils Absolute: 0.4 K/uL — ABNORMAL HIGH (ref 0.0–0.1)
Basophils Relative: 4 %
Eosinophils Absolute: 0.8 K/uL — ABNORMAL HIGH (ref 0.0–0.5)
Eosinophils Relative: 7 %
HCT: 36.4 % (ref 36.0–46.0)
Hemoglobin: 11.3 g/dL — ABNORMAL LOW (ref 12.0–15.0)
Lymphocytes Relative: 5 %
Lymphs Abs: 0.6 K/uL — ABNORMAL LOW (ref 0.7–4.0)
MCH: 26.2 pg (ref 26.0–34.0)
MCHC: 31 g/dL (ref 30.0–36.0)
MCV: 84.5 fL (ref 80.0–100.0)
Metamyelocytes Relative: 6 %
Monocytes Absolute: 0.6 K/uL (ref 0.1–1.0)
Monocytes Relative: 5 %
Myelocytes: 2 %
Neutro Abs: 7.8 K/uL — ABNORMAL HIGH (ref 1.7–7.7)
Neutrophils Relative %: 71 %
Platelets: 196 K/uL (ref 150–400)
RBC: 4.31 MIL/uL (ref 3.87–5.11)
RDW: 21.2 % — ABNORMAL HIGH (ref 11.5–15.5)
WBC: 11 K/uL — ABNORMAL HIGH (ref 4.0–10.5)
nRBC: 0 % (ref 0.0–0.2)

## 2024-09-28 LAB — COMPREHENSIVE METABOLIC PANEL WITH GFR
ALT: 16 U/L (ref 0–44)
AST: 28 U/L (ref 15–41)
Albumin: 3.8 g/dL (ref 3.5–5.0)
Alkaline Phosphatase: 146 U/L — ABNORMAL HIGH (ref 38–126)
Anion gap: 11 (ref 5–15)
BUN: 17 mg/dL (ref 8–23)
CO2: 24 mmol/L (ref 22–32)
Calcium: 8.6 mg/dL — ABNORMAL LOW (ref 8.9–10.3)
Chloride: 101 mmol/L (ref 98–111)
Creatinine, Ser: 0.65 mg/dL (ref 0.44–1.00)
GFR, Estimated: >60 mL/min (ref >60)
Glucose, Bld: 120 mg/dL — ABNORMAL HIGH (ref 70–99)
Potassium: 4 mmol/L (ref 3.5–5.1)
Sodium: 136 mmol/L (ref 135–145)
Total Bilirubin: 0.6 mg/dL (ref 0.0–1.2)
Total Protein: 6.6 g/dL (ref 6.5–8.1)

## 2024-09-28 MED ORDER — ONDANSETRON HCL 4 MG PO TABS: 4.0000 mg | ORAL_TABLET | Freq: Four times a day (QID) | ORAL | Status: DC | PRN

## 2024-09-28 MED ORDER — LEVOTHYROXINE SODIUM 50 MCG PO TABS
50.0000 ug | ORAL_TABLET | Freq: Every day | ORAL | Status: AC
Start: 2024-09-29 — End: ?
  Administered 2024-09-29 – 2024-10-05 (×7): 50 ug via ORAL
  Filled 2024-09-28 (×5): qty 1
  Filled 2024-09-28: qty 2
  Filled 2024-09-28: qty 1

## 2024-09-28 MED ORDER — HYDROXYUREA 500 MG PO CAPS: 500.0000 mg | ORAL_CAPSULE | Freq: Every day | ORAL | Status: DC

## 2024-09-28 MED ORDER — ACETAMINOPHEN 650 MG RE SUPP
650.0000 mg | Freq: Four times a day (QID) | RECTAL | Status: DC | PRN
Start: 2024-09-28 — End: 2024-10-05

## 2024-09-28 MED ORDER — HYDROMORPHONE HCL 1 MG/ML IJ SOLN
0.5000 mg | INTRAMUSCULAR | Status: DC | PRN
  Administered 2024-09-28 – 2024-10-04 (×5): 0.5 mg via INTRAVENOUS
  Filled 2024-09-28 (×5): qty 0.5

## 2024-09-28 MED ORDER — ONDANSETRON HCL 4 MG/2ML IJ SOLN
4.0000 mg | Freq: Four times a day (QID) | INTRAMUSCULAR | Status: DC | PRN
  Administered 2024-09-29 – 2024-09-30 (×3): 4 mg via INTRAVENOUS
  Filled 2024-09-28 (×2): qty 2

## 2024-09-28 MED ORDER — ACETAMINOPHEN 325 MG PO TABS: 650.0000 mg | ORAL_TABLET | Freq: Four times a day (QID) | ORAL | Status: DC | PRN

## 2024-09-28 MED ORDER — POLYETHYLENE GLYCOL 3350 17 G PO PACK
17.0000 g | PACK | Freq: Every day | ORAL | Status: DC | PRN
Start: 2024-09-28 — End: 2024-10-05

## 2024-09-28 MED ORDER — HYDROXYUREA 500 MG PO CAPS
500.0000 mg | ORAL_CAPSULE | ORAL | Status: DC
  Administered 2024-09-29 – 2024-10-05 (×5): 500 mg via ORAL
  Filled 2024-09-28 (×6): qty 1

## 2024-09-28 MED ORDER — DILTIAZEM HCL ER COATED BEADS 180 MG PO CP24
180.0000 mg | ORAL_CAPSULE | Freq: Every day | ORAL | Status: DC
  Administered 2024-09-29 – 2024-10-02 (×4): 180 mg via ORAL
  Filled 2024-09-28 (×4): qty 1

## 2024-09-28 MED ORDER — SODIUM CHLORIDE 0.9 % IV SOLN: INTRAVENOUS | Status: AC

## 2024-09-28 NOTE — ED Notes (Signed)
 Patient transported to X-ray

## 2024-09-28 NOTE — H&P (Signed)
 History and Physical    ANNALIESE SAEZ FMW:[id number] DOB: 23-Oct-1936 DOA: 09/28/2024  PCP: Mancil Pfeiffer, PA-C   Patient coming from: Home  I have personally briefly reviewed patient's old medical records in [facility]  Chief Complaint: Fall  HPI: DELORESE SELLIN is a 88 y.o. female with medical history significant for hypertension, hypothyroidism, polycythemia vera, anxiety. Patient presented to the ED with reports of a fall.  Patient was holding a cane and trying to open door of her car, after shopping at [facility].  Her knee twisted and she fell onto the floor.  She denies dizziness, no chest pain or difficulty breathing.  She has maintained good oral intake.  No GI losses.   She has had prior falls, sustaining fractures to her left arm - 12/2022 requiring surgery, has also had left hip arthroplasty in 2019.  ED Course: Temperature 98.2.  Heart rate 93.  Respirate rate 16.  Blood pressure 177/86.  O2 sats greater than 92% on room air. Left femur x-ray shows severely comminuted distal left femoral shaft fracture. EDP talked to Dr. Hosie see in consult, plans for surgery- 10/16.  Review of Systems: As per HPI all other systems reviewed and negative.  Past Medical History:  Diagnosis Date   Hypertension    Influenza 11/2012   Osteoporosis    Polycythemia    Polycythemia vera(238.4) 02/25/2013   Diagnosed August 2005 on Hydrea  500 mg 3 times a week with excellent control.    Prolapse, uterus, congenital    ring placement    Past Surgical History:  Procedure Laterality Date   APPENDECTOMY     COLONOSCOPY     INCISION AND DRAINAGE Left 01/21/2022   Procedure: INCISION AND DRAINAGE LEFT INDEX FINGER;  Surgeon: Murrell Drivers, MD;  Location: [facility];  Service: Orthopedics;  Laterality: Left;   OOPHORECTOMY     left   opperectomy     ORIF RADIAL FRACTURE Left 12/27/2022   Procedure: OPEN REDUCTION INTERNAL FIXATION (ORIF) RADIAL and ULNAR  FRACTURE;  Surgeon: Margrette Taft BRAVO, MD;  Location: [facility];  Service: Orthopedics;  Laterality: Left;   REVISION TOTAL HIP ARTHROPLASTY Left      reports that she has never smoked. She has never used smokeless tobacco. She reports that she does not drink alcohol and does not use drugs.  Allergies  Allergen Reactions   Codeine Nausea And Vomiting   Diovan [Valsartan] Itching and Rash   Lamisil [Terbinafine] Swelling   Penicillins Rash and Other (See Comments)    Breaking out in whelps     Family History  Problem Relation Age of Onset   Cancer Brother        throat cancer   Cancer Brother        lung cancer   Cancer Brother        prostate cancer    Prior to Admission medications   Medication Sig Start Date End Date Taking? Authorizing Provider  aspirin  81 MG tablet Take 81 mg by mouth every evening.   Yes [provider]  cetirizine (ZYRTEC) 10 MG tablet Take 10 mg by mouth daily as needed for allergies.   Yes [provider]  diltiazem  (CARDIZEM  CD) 180 MG 24 hr capsule Take 1 capsule (180 mg total) by mouth daily. 08/23/24  Yes Grooms, [place], PA-C  hydroxyurea  (HYDREA ) 500 MG capsule May take with food to minimize GI side effects on Monday - Friday only Patient taking differently: Take 500  mg by mouth daily. May take with food to minimize GI side effects on Monday - Friday only 06/07/24  Yes Burns, Delon BRAVO, NP  hydrOXYzine  (VISTARIL ) 25 MG capsule Take 1 capsule (25 mg total) by mouth every 8 (eight) hours as needed. Patient taking differently: Take 25 mg by mouth every 8 (eight) hours as needed for anxiety or itching. 08/23/24  Yes Grooms, Courtney, PA-C  levothyroxine  (SYNTHROID ) 50 MCG tablet Take 1 tablet (50 mcg total) by mouth daily before breakfast. 08/24/24  Yes Grooms, Courtney, PA-C  mometasone (NASONEX) 50 MCG/ACT nasal spray Place 2 sprays into the nose daily as needed (congestion). 10/23/23  Yes [provider]  Vitamin D ,  Ergocalciferol , (DRISDOL ) 1.25 MG (50000 UNIT) CAPS capsule Take 1 capsule (50,000 Units total) by mouth every 14 (fourteen) days. Patient taking differently: Take 50,000 Units by mouth every 30 (thirty) days. 08/23/24  Yes Grooms, [place], [place]   Physical Exam: Vitals:   09/28/24 1517  BP: (!) 177/86  Pulse: 93  Resp: 16  Temp: 98.2 F (36.8 C)  SpO2: 92%  Weight: 47.6 kg  Height: 5' 1 (1.549 m)    Constitutional: NAD, calm, comfortable Vitals:   09/28/24 1517  BP: (!) 177/86  Pulse: 93  Resp: 16  Temp: 98.2 F (36.8 C)  SpO2: 92%  Weight: 47.6 kg  Height: 5' 1 (1.549 m)   Eyes: PERRL, lids and conjunctivae normal ENMT: Mucous membranes are dry  Neck: normal, supple, no masses, no thyromegaly Respiratory: clear to auscultation bilaterally, no wheezing, no crackles. Normal respiratory effort. No accessory muscle use.  Cardiovascular: Regular rate and rhythm, no murmurs / rubs / gallops. No extremity edema.  Extremities warm. Abdomen: no tenderness, no masses palpated. No hepatosplenomegaly. Bowel sounds positive.  Musculoskeletal: no clubbing / cyanosis.  Splints to left forearm and wrist. Skin: no rashes, lesions, ulcers. No induration Neurologic: No facial asymmetry, moving all extremities spontaneously, but left lower extremity not tested. Psychiatric: Normal judgment and insight. Alert and oriented x 3. Normal mood.   Labs on Admission: I have personally reviewed following labs and imaging studies  CBC: Recent Labs  Lab 09/28/24 1640  WBC 11.0*  NEUTROABS 7.8*  HGB 11.3*  HCT 36.4  MCV 84.5  PLT 196   Basic Metabolic Panel: Recent Labs  Lab 09/28/24 1640  NA 136  K 4.0  CL 101  CO2 24  GLUCOSE 120*  BUN 17  CREATININE 0.65  CALCIUM 8.6*   GFR: Estimated Creatinine Clearance: 37.9 mL/min (by C-G formula based on SCr of 0.65 mg/dL). Liver Function Tests: Recent Labs  Lab 09/28/24 1640  AST 28  ALT 16  ALKPHOS 146*  BILITOT 0.6  PROT 6.6   ALBUMIN 3.8   Radiological Exams on Admission: DG Femur Min 2 Views Left Result Date: 09/28/2024 EXAM: 2 VIEW(S) XRAY OF THE LEFT FEMUR 09/28/2024 04:53:00 PM COMPARISON: Comparison 01/11/2024. CLINICAL HISTORY: Fall. Patient fell while trying to get into car. She complains of left knee pain. Patient has limited mobility of left hip and leg. FINDINGS: BONES AND JOINTS: Status post left hip arthroplasty. Severely comminuted distal left femoral shaft fracture is noted. SOFT TISSUES: The soft tissues are unremarkable. IMPRESSION: 1. Severely comminuted distal left femoral shaft fracture. Electronically signed by: Lynwood Seip MD 09/28/2024 05:12 PM EDT RP Workstation: [id number]   DG Knee Complete 4 Views Left Result Date: 09/28/2024 EXAM: 4 OR MORE VIEW(S) XRAY OF THE LEFT KNEE 09/28/2024 03:47:00 PM COMPARISON: None available. CLINICAL HISTORY: pain.  She c/o left knee pain. FINDINGS: BONES AND JOINTS: There is an acute transverse on oblique comminuted fracture of the distal femoral diaphysis. There is mild apex anterior angulation. The bones are osteopenic. No focal osseous lesion. No dislocation identified. No significant joint effusion. There are moderate degenerative changes of the lateral compartment of the knee with joint space narrowing and osteophyte formation. SOFT TISSUES: There is surrounding soft tissue swelling and edema/hematoma. IMPRESSION: 1. Acute comminuted distal femoral diaphyseal fracture with mild apex anterior angulation. 2. Moderate lateral compartment knee osteoarthritis. 3. Osteopenia. Electronically signed by: Greig Pique MD 09/28/2024 03:54 PM EDT RP Workstation: [id number]   EKG: Pending.   Assessment/Plan Principal Problem:   Closed displaced comminuted fracture of shaft of left femur (HCC) Active Problems:   Polycythemia vera (HCC)   Hypothyroid   Primary hypertension   Generalized anxiety disorder with panic attacks   Assessment and Plan:  Closed displaced  comminuted fracture of left femur-sustained by mechanical fall.  X-ray shows - acute comminuted distal femoral diaphyseal fracture with mild apex anterior angulation. - EDP talked with Dr. Onesimo, plan for surgery 10/16. - N/s 75cc/hr x 15hrs -IV morphine 2 mg every 4 hours as needed  Polycythemia vera -Resume hydroxyurea   Hypertension - Resume Cardizem   Hypothyroidism - Resume Synthroid   DVT prophylaxis: SCDS Code Status: FULL code Family Communication: Daughter- Pam at bedside Disposition Plan: ~ 2 days Consults called: Orthopedics Admission status: Inpt med surg I certify that at the point of admission it is my clinical judgment that the patient will require inpatient hospital care spanning beyond 2 midnights from the point of admission due to high intensity of service, high risk for further deterioration and high frequency of surveillance required.    Author: Tully FORBES Carwin, MD 09/28/2024 7:51 PM  For on call review [email or web address].

## 2024-09-28 NOTE — ED Triage Notes (Signed)
 Pt fell while trying to get into car. She c/o left knee pain.

## 2024-09-28 NOTE — ED Provider Notes (Signed)
 [place] EMERGENCY DEPARTMENT AT [facility] Provider Note   CSN: [id number] Arrival date & time: 09/28/24  8491     Patient presents with: [place]   [name] is a 88 y.o. female.   Patient slipped and fell and complains of left knee pain  The history is provided by the patient and medical records. No language interpreter was used.  Fall This is a new problem. The current episode started 3 to 5 hours ago. The problem occurs rarely. The problem has been resolved. Pertinent negatives include no chest pain, no abdominal pain and no headaches. Nothing aggravates the symptoms. Nothing relieves the symptoms. She has tried nothing for the symptoms.       Prior to Admission medications   Medication Sig Start Date End Date Taking? Authorizing Provider  aspirin  81 MG tablet Take 81 mg by mouth every evening.   Yes [provider]  cetirizine (ZYRTEC) 10 MG tablet Take 10 mg by mouth daily as needed for allergies.   Yes [provider]  diltiazem  (CARDIZEM  CD) 180 MG 24 hr capsule Take 1 capsule (180 mg total) by mouth daily. 08/23/24  Yes Grooms, [place], PA-C  hydroxyurea  (HYDREA ) 500 MG capsule May take with food to minimize GI side effects on Monday - Friday only Patient taking differently: Take 500 mg by mouth daily. May take with food to minimize GI side effects on Monday - Friday only 06/07/24  Yes Burns, Delon BRAVO, NP  hydrOXYzine  (VISTARIL ) 25 MG capsule Take 1 capsule (25 mg total) by mouth every 8 (eight) hours as needed. Patient taking differently: Take 25 mg by mouth every 8 (eight) hours as needed for anxiety or itching. 08/23/24  Yes Grooms, Charmaine, PA-C  levothyroxine  (SYNTHROID ) 50 MCG tablet Take 1 tablet (50 mcg total) by mouth daily before breakfast. 08/24/24  Yes Grooms, Courtney, PA-C  mometasone (NASONEX) 50 MCG/ACT nasal spray Place 2 sprays into the nose daily as needed (congestion). 10/23/23  Yes [provider]  Vitamin D ,  Ergocalciferol , (DRISDOL ) 1.25 MG (50000 UNIT) CAPS capsule Take 1 capsule (50,000 Units total) by mouth every 14 (fourteen) days. Patient taking differently: Take 50,000 Units by mouth every 30 (thirty) days. 08/23/24  Yes Grooms, [place], PA-C    Allergies: Codeine, Diovan [valsartan], Lamisil [terbinafine], and Penicillins    Review of Systems  Constitutional:  Negative for appetite change and fatigue.  HENT:  Negative for congestion, ear discharge and sinus pressure.   Eyes:  Negative for discharge.  Respiratory:  Negative for cough.   Cardiovascular:  Negative for chest pain.  Gastrointestinal:  Negative for abdominal pain and diarrhea.  Genitourinary:  Negative for frequency and hematuria.  Musculoskeletal:  Negative for back pain.       Painful right thigh  Skin:  Negative for rash.  Neurological:  Negative for seizures and headaches.  Psychiatric/Behavioral:  Negative for hallucinations.     Updated Vital Signs BP (!) 177/86   Pulse 93   Temp 98.2 F (36.8 C)   Resp 16   Ht 5' 1 (1.549 m)   Wt 47.6 kg   SpO2 92%   BMI 19.84 kg/m   Physical Exam Vitals and nursing note reviewed.  Constitutional:      Appearance: She is well-developed.  HENT:     Head: Normocephalic.     Nose: Nose normal.  Eyes:     General: No scleral icterus.    Conjunctiva/sclera: Conjunctivae normal.  Neck:  Thyroid : No thyromegaly.  Cardiovascular:     Rate and Rhythm: Normal rate and regular rhythm.     Heart sounds: No murmur heard.    No friction rub. No gallop.  Pulmonary:     Breath sounds: No stridor. No wheezing or rales.  Chest:     Chest wall: No tenderness.  Abdominal:     General: There is no distension.     Tenderness: There is no abdominal tenderness. There is no rebound.  Musculoskeletal:     Cervical back: Neck supple.     Comments: Tender right thigh  Lymphadenopathy:     Cervical: No cervical adenopathy.  Skin:    Findings: No erythema or rash.   Neurological:     Mental Status: She is alert and oriented to person, place, and time.     Motor: No abnormal muscle tone.     Coordination: Coordination normal.  Psychiatric:        Behavior: Behavior normal.     (all labs ordered are listed, but only abnormal results are displayed) Labs Reviewed  CBC WITH DIFFERENTIAL/PLATELET - Abnormal; Notable for the following components:      Result Value   WBC 11.0 (*)    Hemoglobin 11.3 (*)    RDW 21.2 (*)    Neutro Abs 7.8 (*)    Lymphs Abs 0.6 (*)    Eosinophils Absolute 0.8 (*)    Basophils Absolute 0.4 (*)    Abs Immature Granulocytes 0.90 (*)    All other components within normal limits  COMPREHENSIVE METABOLIC PANEL WITH GFR - Abnormal; Notable for the following components:   Glucose, Bld 120 (*)    Calcium 8.6 (*)    Alkaline Phosphatase 146 (*)    All other components within normal limits    EKG: None  Radiology: DG Femur Min 2 Views Left Result Date: 09/28/2024 EXAM: 2 VIEW(S) XRAY OF THE LEFT FEMUR 09/28/2024 04:53:00 PM COMPARISON: Comparison 01/11/2024. CLINICAL HISTORY: Fall. Patient fell while trying to get into car. She complains of left knee pain. Patient has limited mobility of left hip and leg. FINDINGS: BONES AND JOINTS: Status post left hip arthroplasty. Severely comminuted distal left femoral shaft fracture is noted. SOFT TISSUES: The soft tissues are unremarkable. IMPRESSION: 1. Severely comminuted distal left femoral shaft fracture. Electronically signed by: Lynwood Seip MD 09/28/2024 05:12 PM EDT RP Workstation: [id number]   DG Knee Complete 4 Views Left Result Date: 09/28/2024 EXAM: 4 OR MORE VIEW(S) XRAY OF THE LEFT KNEE 09/28/2024 03:47:00 PM COMPARISON: None available. CLINICAL HISTORY: pain. She c/o left knee pain. FINDINGS: BONES AND JOINTS: There is an acute transverse on oblique comminuted fracture of the distal femoral diaphysis. There is mild apex anterior angulation. The bones are osteopenic. No  focal osseous lesion. No dislocation identified. No significant joint effusion. There are moderate degenerative changes of the lateral compartment of the knee with joint space narrowing and osteophyte formation. SOFT TISSUES: There is surrounding soft tissue swelling and edema/hematoma. IMPRESSION: 1. Acute comminuted distal femoral diaphyseal fracture with mild apex anterior angulation. 2. Moderate lateral compartment knee osteoarthritis. 3. Osteopenia. Electronically signed by: Greig Pique MD 09/28/2024 03:54 PM EDT RP Workstation: [id number]     Procedures   Medications Ordered in the ED - No data to display   I spoke with orthopedics Dr. Onesimo and he stated to have the hospitalist admit the patient and he will try to do the surgery on Thursday.  He will see the patient tomorrow  Medical Decision Making Amount and/or Complexity of Data Reviewed Labs: ordered. Radiology: ordered.  Risk Decision regarding hospitalization.   Fall with femur fracture.  She is admitted to medicine with surgery planning to repair her fractured femur in 2 days     Final diagnoses:  Closed fracture of neck of left femur, initial encounter [facility])    ED Discharge Orders     None          Suzette Pac, MD 10/01/24 1436

## 2024-09-28 NOTE — ED Notes (Signed)
 Pt complaining of pain in the left hip/knee. Said that she is soaked and needs to be cleaned up. Did roll and dry patient and bedding.

## 2024-09-29 DIAGNOSIS — S72352A Displaced comminuted fracture of shaft of left femur, initial encounter for closed fracture: Secondary | ICD-10-CM | POA: Diagnosis not present

## 2024-09-29 MED ORDER — TRANEXAMIC ACID-NACL 1000-0.7 MG/100ML-% IV SOLN
1000.0000 mg | INTRAVENOUS | Status: AC
Start: 2024-09-30 — End: 2024-10-01
  Administered 2024-09-30: 1000 mg via INTRAVENOUS

## 2024-09-29 MED ORDER — CEFAZOLIN SODIUM-DEXTROSE 2-4 GM/100ML-% IV SOLN
2.0000 g | INTRAVENOUS | Status: AC
  Administered 2024-09-30: 2 g via INTRAVENOUS
  Filled 2024-09-29: qty 100

## 2024-09-29 NOTE — TOC CM/SW Note (Signed)
 Transition of Care [facility]) - Inpatient Brief Assessment   Patient Details  Name: [name] MRN: [id number] Date of Birth: Sep 08, 1936  [facility] CM/SW Contact:    Noreen KATHEE Pinal, LCSWA Phone Number: 09/29/2024, 1:58 PM   Clinical Narrative:  CSW spoke with patient at bedside . Patient shared that she lives alone but has a niece who will come stay with her during the week and then she will go stay with her daughter over the weekends. Patient reports that her daughter lives about 5 miles from her and that she is very supportive such as getting her groceries and sometimes her medications. CSW shared with patient that MD will place PT/OT orderes after her surgery to recommend SNF or HH. Patient advised CSW to speak with her daughter to discuss recommendation, but prefers to be at home getting [facility] services. CSW will continue to follow.  Transition of Care Asessment: Insurance and Status: Insurance coverage has been reviewed Patient has primary care physician: Yes Home environment has been reviewed: [facility] Prior level of function:: Independent Prior/Current Home Services: No current home services Social Drivers of Health Review: SDOH reviewed no interventions necessary Readmission risk has been reviewed: Yes Transition of care needs: transition of care needs identified, TOC will continue to follow

## 2024-09-29 NOTE — Plan of Care (Signed)

## 2024-09-29 NOTE — Consult Note (Signed)
 ORTHOPAEDIC CONSULTATION  REQUESTING PHYSICIAN: Mcarthur Pick, MD  ASSESSMENT AND PLAN: 88 y.o. female with the following: Comminuted left distal femur fracture  Patient will require surgical fixation of her left femur fracture.  Plan to have her admitted to the medicine team, with preoperative clearance.  She is to remain nonweightbearing.  Will plan to proceed with surgery on 09/30/2024.  - Weight Bearing Status/Activity: Weightbearing  - Additional recommended labs/tests: CBC, BMP, EKG, chest x-ray, PT/INR  -VTE Prophylaxis: As needed.  Recommend 81 mg aspirin  to begin postoperative day 1.  Please hold anticoagulation until surgery  - Pain control: As needed  - Follow-up plan: Approximately 2 weeks after surgery  -Procedures: Plan for retrograde nailing of left femur  Chief Complaint: Left leg pain  HPI: [name] is a 88 y.o. female with past medical history as listed below, who presented to the emergency department yesterday after a fall.  She has a history of multiple falls recently.  She does use a cane to assist with ambulation.  She was shopping yesterday, when her leg twisted and she fell.  She had immediate pain.  Pain is controlled when she is not moving.  No numbness or tingling distally.  She has a history of left hip bipolar, completed 5-6 years ago.  No issues since surgery.  He is comfortable with weaning immobilizer.  Past Medical History:  Diagnosis Date   Hypertension    Influenza 11/2012   Osteoporosis    Polycythemia    Polycythemia vera(238.4) 02/25/2013   Diagnosed August 2005 on Hydrea  500 mg 3 times a week with excellent control.    Prolapse, uterus, congenital    ring placement   Past Surgical History:  Procedure Laterality Date   APPENDECTOMY     COLONOSCOPY     INCISION AND DRAINAGE Left 01/21/2022   Procedure: INCISION AND DRAINAGE LEFT INDEX FINGER;  Surgeon: Murrell Drivers, MD;  Location: [facility];  Service:  Orthopedics;  Laterality: Left;   OOPHORECTOMY     left   opperectomy     ORIF RADIAL FRACTURE Left 12/27/2022   Procedure: OPEN REDUCTION INTERNAL FIXATION (ORIF) RADIAL and ULNAR FRACTURE;  Surgeon: Margrette Taft BRAVO, MD;  Location: [facility];  Service: Orthopedics;  Laterality: Left;   REVISION TOTAL HIP ARTHROPLASTY Left    Social History   Socioeconomic History   Marital status: Divorced    Spouse name: Not on file   Number of children: 1   Years of education: Not on file   Highest education level: 11th grade  Occupational History   Not on file  Tobacco Use   Smoking status: Never   Smokeless tobacco: Never  Vaping Use   Vaping status: Never Used  Substance and Sexual Activity   Alcohol use: No   Drug use: No   Sexual activity: Yes    Birth control/protection: Post-menopausal  Other Topics Concern   Not on file  Social History Narrative   Not on file   Social Drivers of Health   Financial Resource Strain: Not on file  Food Insecurity: No Food Insecurity (09/28/2024)   Hunger Vital Sign    Worried About Running Out of Food in the Last Year: Never true    Ran Out of Food in the Last Year: Never true  Transportation Needs: No Transportation Needs (09/28/2024)   PRAPARE - Administrator, Civil Service (Medical): No    Lack of Transportation (Non-Medical): No  Physical Activity: Insufficiently  Active (08/23/2024)   Exercise Vital Sign    Days of Exercise per Week: 4 days    Minutes of Exercise per Session: 30 min  Stress: Stress Concern Present (08/23/2024)   [employer] of Occupational Health - Occupational Stress Questionnaire    Feeling of Stress: To some extent  Social Connections: Socially Isolated (09/28/2024)   Social Connection and Isolation Panel    Frequency of Communication with Friends and Family: More than three times a week    Frequency of Social Gatherings with Friends and Family: More than three times a week    Attends Religious  Services: Never    Database administrator or Organizations: No    Attends Engineer, structural: Never    Marital Status: Divorced   Family History  Problem Relation Age of Onset   Cancer Brother        throat cancer   Cancer Brother        lung cancer   Cancer Brother        prostate cancer   Allergies  Allergen Reactions   Codeine Nausea And Vomiting   Diovan [Valsartan] Itching and Rash   Lamisil [Terbinafine] Swelling   Penicillins Rash and Other (See Comments)    Breaking out in whelps    Prior to Admission medications   Medication Sig Start Date End Date Taking? Authorizing Provider  aspirin  81 MG tablet Take 81 mg by mouth every evening.   Yes [provider]  cetirizine (ZYRTEC) 10 MG tablet Take 10 mg by mouth daily as needed for allergies.   Yes [provider]  diltiazem  (CARDIZEM  CD) 180 MG 24 hr capsule Take 1 capsule (180 mg total) by mouth daily. 08/23/24  Yes Grooms, Lafitte, PA-C  hydroxyurea  (HYDREA ) 500 MG capsule May take with food to minimize GI side effects on Monday - Friday only Patient taking differently: Take 500 mg by mouth daily. May take with food to minimize GI side effects on Monday - Friday only 06/07/24  Yes Burns, Delon BRAVO, NP  hydrOXYzine  (VISTARIL ) 25 MG capsule Take 1 capsule (25 mg total) by mouth every 8 (eight) hours as needed. Patient taking differently: Take 25 mg by mouth every 8 (eight) hours as needed for anxiety or itching. 08/23/24  Yes Grooms, Charmaine, PA-C  levothyroxine  (SYNTHROID ) 50 MCG tablet Take 1 tablet (50 mcg total) by mouth daily before breakfast. 08/24/24  Yes Grooms, Courtney, PA-C  mometasone (NASONEX) 50 MCG/ACT nasal spray Place 2 sprays into the nose daily as needed (congestion). 10/23/23  Yes [provider]  Vitamin D , Ergocalciferol , (DRISDOL ) 1.25 MG (50000 UNIT) CAPS capsule Take 1 capsule (50,000 Units total) by mouth every 14 (fourteen) days. Patient taking differently: Take  50,000 Units by mouth every 30 (thirty) days. 08/23/24  Yes Grooms, [place], [place]   DG CHEST PORT 1 VIEW Result Date: 09/28/2024 EXAM: 1 VIEW(S) XRAY OF THE CHEST 09/28/2024 07:56:00 PM COMPARISON: None available. CLINICAL HISTORY: Pre-op chest exam [phone]. Pre op. No chest complaints. FINDINGS: LUNGS AND PLEURA: Mild biapical pleural parenchymal scarring. No pulmonary edema. No pleural effusion. No pneumothorax. HEART AND MEDIASTINUM: Atherosclerotic calcifications. No acute abnormality of the cardiac and mediastinal silhouettes. BONES AND SOFT TISSUES: No acute osseous abnormality. IMPRESSION: 1. No acute process. Electronically signed by: Pinkie Pebbles MD 09/28/2024 08:05 PM EDT RP Workstation: [id number]   DG Femur Min 2 Views Left Result Date: 09/28/2024 EXAM: 2 VIEW(S) XRAY OF THE LEFT FEMUR 09/28/2024 04:53:00 PM COMPARISON: Comparison 01/11/2024.  CLINICAL HISTORY: Fall. Patient fell while trying to get into car. She complains of left knee pain. Patient has limited mobility of left hip and leg. FINDINGS: BONES AND JOINTS: Status post left hip arthroplasty. Severely comminuted distal left femoral shaft fracture is noted. SOFT TISSUES: The soft tissues are unremarkable. IMPRESSION: 1. Severely comminuted distal left femoral shaft fracture. Electronically signed by: Lynwood Seip MD 09/28/2024 05:12 PM EDT RP Workstation: [id number]   DG Knee Complete 4 Views Left Result Date: 09/28/2024 EXAM: 4 OR MORE VIEW(S) XRAY OF THE LEFT KNEE 09/28/2024 03:47:00 PM COMPARISON: None available. CLINICAL HISTORY: pain. She c/o left knee pain. FINDINGS: BONES AND JOINTS: There is an acute transverse on oblique comminuted fracture of the distal femoral diaphysis. There is mild apex anterior angulation. The bones are osteopenic. No focal osseous lesion. No dislocation identified. No significant joint effusion. There are moderate degenerative changes of the lateral compartment of the knee with joint space narrowing  and osteophyte formation. SOFT TISSUES: There is surrounding soft tissue swelling and edema/hematoma. IMPRESSION: 1. Acute comminuted distal femoral diaphyseal fracture with mild apex anterior angulation. 2. Moderate lateral compartment knee osteoarthritis. 3. Osteopenia. Electronically signed by: Greig Pique MD 09/28/2024 03:54 PM EDT RP Workstation: [id number]   Family History Reviewed and non-contributory, no pertinent history of problems with bleeding or anesthesia    Review of Systems No fevers or chills No numbness or tingling No chest pain No shortness of breath No bowel or bladder dysfunction No GI distress No headaches    OBJECTIVE  Vitals:Patient Vitals for the past 8 hrs:  BP Temp Temp src Pulse Resp SpO2  09/29/24 0620 135/79 99.2 F (37.3 C) Oral 84 20 91 %   General: Alert, no acute distress Cardiovascular: Warm extremities noted Respiratory: No cyanosis, no use of accessory musculature GI: No organomegaly, abdomen is soft and non-tender Skin: No lesions in the area of chief complaint other than those listed below in MSK exam.  Neurologic: Sensation intact distally save for the below mentioned MSK exam Psychiatric: Patient is competent for consent with normal mood and affect Lymphatic: No swelling obvious and reported other than the area involved in the exam below Extremities   LLE: Swelling of the left thigh.  No open lesions.  Sensation intact distally.  Active motion intact in EHL/TA.  Knee immobilizer is fitting appropriately.    Test Results Imaging X-rays of the left knee and left femur demonstrates a comminuted distal femoral shaft fracture.  There is no obvious extension to the joint.   left hip bipolar hemiarthroplasty in stable position.  No subsidence.  No new fractures.  Hip is located.  Labs cbc Recent Labs    09/28/24 1640  WBC 11.0*  HGB 11.3*  HCT 36.4  PLT 196     Recent Labs    09/28/24 1640  NA 136  K 4.0  CL 101  CO2 24   GLUCOSE 120*  BUN 17  CREATININE 0.65  CALCIUM 8.6*

## 2024-09-29 NOTE — Progress Notes (Signed)
 PROGRESS NOTE    [name]  FMW:[id number] DOB: November 16, 1936 DOA: 09/28/2024 PCP: Mancil Pfeiffer, PA-C   Brief Narrative:  [name] is a 88 y.o. female with medical history significant for hypertension, hypothyroidism, polycythemia vera, anxiety. Patient presented to the ED with reports of a fall.  Patient was holding a cane and trying to open door of her car, after shopping at [facility].  Her knee twisted and she fell onto the floor.   Assessment & Plan:   Principal Problem:   Closed displaced comminuted fracture of shaft of left femur (HCC) Active Problems:   Polycythemia vera (HCC)   Hypothyroid   Primary hypertension   Generalized anxiety disorder with panic attacks   Assessment and Plan:   Closed displaced comminuted fracture of left femur-sustained by mechanical fall.  X-ray shows - acute comminuted distal femoral diaphyseal fracture with mild apex anterior angulation. - EDP talked with Dr. Onesimo, plan for surgery 10/16. -IV morphine 2 mg every 4 hours as needed   Polycythemia vera -Resume hydroxyurea    Hypertension - Resume Cardizem    Hypothyroidism - Resume Synthroid     DVT prophylaxis: SCDS Code Status: FULL code Family Communication: none at the bedside Disposition Plan: ~ 2 days Consults called: Orthopedics Admission status: Inpt med surg I certify that at the point of admission it is my clinical judgment that the patient will require inpatient hospital care spanning beyond 2 midnights from the point of admission due to high intensity of service, high risk for further deterioration and high frequency of surveillance required.     Subjective: Patient was seen and examined on 09/29/2024.  She reports pain in the left hip which is controlled with medications.  Denies any chest pain, shortness of breath, dizziness.  Vital signs are stable..  Chest x-ray shows no acute findings.  Objective: Vitals:   09/28/24 2100 09/28/24 2201 09/29/24 0144  09/29/24 0620  BP: (!) 152/66 (!) 177/79 (!) 177/77 135/79  Pulse: 79 89 77 84  Resp: (!) 22 (!) 21 19 20   Temp: 98.2 F (36.8 C) 98.5 F (36.9 C) 98.5 F (36.9 C) 99.2 F (37.3 C)  TempSrc:  Oral Oral Oral  SpO2: 94% 92% 92% 91%  Weight:  47.9 kg    Height:  5' 1 (1.549 m)      Intake/Output Summary (Last 24 hours) at 09/29/2024 0912 Last data filed at 09/29/2024 9377 Gross per 24 hour  Intake 738.75 ml  Output 250 ml  Net 488.75 ml   Filed Weights   09/28/24 1517 09/28/24 2201  Weight: 47.6 kg 47.9 kg    Examination:  Eyes: PERRL, lids and conjunctivae normal ENMT: Mucous membranes are dry  Neck: normal, supple, no masses, no thyromegaly Respiratory: clear to auscultation bilaterally, no wheezing, no crackles. Normal respiratory effort. No accessory muscle use.  Cardiovascular: Regular rate and rhythm, no murmurs / rubs / gallops. No extremity edema.  Extremities warm. Abdomen: no tenderness, no masses palpated. No hepatosplenomegaly. Bowel sounds positive.  Musculoskeletal: no clubbing / cyanosis.  Splints to left forearm and wrist. Skin: no rashes, lesions, ulcers. No induration Neurologic: No facial asymmetry, moving all extremities spontaneously, but left lower extremity not tested. Psychiatric: Normal judgment and insight. Alert and oriented x 3. Normal mood.    Data Reviewed: I have personally reviewed following labs and imaging studies  CBC: Recent Labs  Lab 09/28/24 1640  WBC 11.0*  NEUTROABS 7.8*  HGB 11.3*  HCT 36.4  MCV 84.5  PLT  196   Basic Metabolic Panel: Recent Labs  Lab 09/28/24 1640  NA 136  K 4.0  CL 101  CO2 24  GLUCOSE 120*  BUN 17  CREATININE 0.65  CALCIUM 8.6*   GFR: Estimated Creatinine Clearance: 38.1 mL/min (by C-G formula based on SCr of 0.65 mg/dL). Liver Function Tests: Recent Labs  Lab 09/28/24 1640  AST 28  ALT 16  ALKPHOS 146*  BILITOT 0.6  PROT 6.6  ALBUMIN 3.8   No results for input(s): LIPASE,  AMYLASE in the last 168 hours. No results for input(s): AMMONIA in the last 168 hours. Coagulation Profile: No results for input(s): INR, PROTIME in the last 168 hours. Cardiac Enzymes: No results for input(s): CKTOTAL, CKMB, CKMBINDEX, TROPONINI in the last 168 hours. BNP (last 3 results) No results for input(s): PROBNP in the last 8760 hours. HbA1C: No results for input(s): HGBA1C in the last 72 hours. CBG: No results for input(s): GLUCAP in the last 168 hours. Lipid Profile: No results for input(s): CHOL, HDL, LDLCALC, TRIG, CHOLHDL, LDLDIRECT in the last 72 hours. Thyroid  Function Tests: No results for input(s): TSH, T4TOTAL, FREET4, T3FREE, THYROIDAB in the last 72 hours. Anemia Panel: No results for input(s): VITAMINB12, FOLATE, FERRITIN, TIBC, IRON, RETICCTPCT in the last 72 hours. Sepsis Labs: No results for input(s): PROCALCITON, LATICACIDVEN in the last 168 hours.  No results found for this or any previous visit (from the past 240 hours).       Radiology Studies: DG CHEST PORT 1 VIEW Result Date: 09/28/2024 EXAM: 1 VIEW(S) XRAY OF THE CHEST 09/28/2024 07:56:00 PM COMPARISON: None available. CLINICAL HISTORY: Pre-op chest exam [phone]. Pre op. No chest complaints. FINDINGS: LUNGS AND PLEURA: Mild biapical pleural parenchymal scarring. No pulmonary edema. No pleural effusion. No pneumothorax. HEART AND MEDIASTINUM: Atherosclerotic calcifications. No acute abnormality of the cardiac and mediastinal silhouettes. BONES AND SOFT TISSUES: No acute osseous abnormality. IMPRESSION: 1. No acute process. Electronically signed by: Pinkie Pebbles MD 09/28/2024 08:05 PM EDT RP Workstation: [id number]   DG Femur Min 2 Views Left Result Date: 09/28/2024 EXAM: 2 VIEW(S) XRAY OF THE LEFT FEMUR 09/28/2024 04:53:00 PM COMPARISON: Comparison 01/11/2024. CLINICAL HISTORY: Fall. Patient fell while trying to get into car. She complains  of left knee pain. Patient has limited mobility of left hip and leg. FINDINGS: BONES AND JOINTS: Status post left hip arthroplasty. Severely comminuted distal left femoral shaft fracture is noted. SOFT TISSUES: The soft tissues are unremarkable. IMPRESSION: 1. Severely comminuted distal left femoral shaft fracture. Electronically signed by: Lynwood Seip MD 09/28/2024 05:12 PM EDT RP Workstation: [id number]   DG Knee Complete 4 Views Left Result Date: 09/28/2024 EXAM: 4 OR MORE VIEW(S) XRAY OF THE LEFT KNEE 09/28/2024 03:47:00 PM COMPARISON: None available. CLINICAL HISTORY: pain. She c/o left knee pain. FINDINGS: BONES AND JOINTS: There is an acute transverse on oblique comminuted fracture of the distal femoral diaphysis. There is mild apex anterior angulation. The bones are osteopenic. No focal osseous lesion. No dislocation identified. No significant joint effusion. There are moderate degenerative changes of the lateral compartment of the knee with joint space narrowing and osteophyte formation. SOFT TISSUES: There is surrounding soft tissue swelling and edema/hematoma. IMPRESSION: 1. Acute comminuted distal femoral diaphyseal fracture with mild apex anterior angulation. 2. Moderate lateral compartment knee osteoarthritis. 3. Osteopenia. Electronically signed by: Greig Pique MD 09/28/2024 03:54 PM EDT RP Workstation: [id number]        Scheduled Meds:  diltiazem   180 mg Oral Daily   hydroxyurea   500 mg Oral Once per day on Monday Tuesday Wednesday Thursday Friday   levothyroxine   50 mcg Oral QAC breakfast   Continuous Infusions:  sodium chloride  75 mL/hr at 09/29/24 0545          Derryl Duval, MD [facility] 09/29/2024, 9:12 AM

## 2024-09-30 ENCOUNTER — Inpatient Hospital Stay (HOSPITAL_COMMUNITY): Admitting: Certified Registered"

## 2024-09-30 ENCOUNTER — Encounter (HOSPITAL_COMMUNITY)
Admission: EM | Disposition: A | Discharge status: Home Health | Payer: Self-pay | Source: Home / Self Care | Attending: Hospitalist

## 2024-09-30 ENCOUNTER — Inpatient Hospital Stay (HOSPITAL_COMMUNITY)

## 2024-09-30 ENCOUNTER — Encounter (HOSPITAL_COMMUNITY): Payer: Self-pay | Admitting: Internal Medicine

## 2024-09-30 DIAGNOSIS — S72302A Unspecified fracture of shaft of left femur, initial encounter for closed fracture: Secondary | ICD-10-CM | POA: Diagnosis not present

## 2024-09-30 DIAGNOSIS — S72352A Displaced comminuted fracture of shaft of left femur, initial encounter for closed fracture: Secondary | ICD-10-CM | POA: Diagnosis not present

## 2024-09-30 HISTORY — PX: FEMUR IM NAIL: SHX1597

## 2024-09-30 LAB — HEMOGLOBIN AND HEMATOCRIT, BLOOD
HCT: 34.2 % — ABNORMAL LOW (ref 36.0–46.0)
Hemoglobin: 10.6 g/dL — ABNORMAL LOW (ref 12.0–15.0)

## 2024-09-30 SURGERY — INSERTION, INTRAMEDULLARY ROD, FEMUR, RETROGRADE
Anesthesia: General | Site: Leg Upper | Laterality: Left

## 2024-09-30 MED ORDER — FENTANYL CITRATE (PF) 250 MCG/5ML IJ SOLN
INTRAMUSCULAR | Status: DC | PRN
  Administered 2024-09-30 (×4): 25 ug via INTRAVENOUS

## 2024-09-30 MED ORDER — PROPOFOL 10 MG/ML IV BOLUS
INTRAVENOUS | Status: DC | PRN
  Administered 2024-09-30: 130 mg via INTRAVENOUS

## 2024-09-30 MED ORDER — DEXAMETHASONE SOD PHOSPHATE PF 10 MG/ML IJ SOLN
INTRAMUSCULAR | Status: DC | PRN
  Administered 2024-09-30: 4 mg via INTRAVENOUS

## 2024-09-30 MED ORDER — LACTATED RINGERS IV SOLN: INTRAVENOUS | Status: DC

## 2024-09-30 MED ORDER — PHENYLEPHRINE 80 MCG/ML (10ML) SYRINGE FOR IV PUSH (FOR BLOOD PRESSURE SUPPORT)
PREFILLED_SYRINGE | INTRAVENOUS | Status: AC
  Filled 2024-09-30: qty 10

## 2024-09-30 MED ORDER — FENTANYL CITRATE (PF) 50 MCG/ML IJ SOSY: 25.0000 ug | PREFILLED_SYRINGE | INTRAMUSCULAR | Status: DC | PRN

## 2024-09-30 MED ORDER — TRANEXAMIC ACID-NACL 1000-0.7 MG/100ML-% IV SOLN
INTRAVENOUS | Status: AC
  Filled 2024-09-30: qty 100

## 2024-09-30 MED ORDER — FENTANYL CITRATE (PF) 100 MCG/2ML IJ SOLN
INTRAMUSCULAR | Status: AC
  Filled 2024-09-30: qty 2

## 2024-09-30 MED ORDER — ORAL CARE MOUTH RINSE: 15.0000 mL | Freq: Once | OROMUCOSAL | Status: AC

## 2024-09-30 MED ORDER — OXYCODONE HCL 5 MG PO TABS: 5.0000 mg | ORAL_TABLET | Freq: Once | ORAL | Status: DC | PRN

## 2024-09-30 MED ORDER — GLYCOPYRROLATE PF 0.2 MG/ML IJ SOSY
PREFILLED_SYRINGE | INTRAMUSCULAR | Status: AC
  Filled 2024-09-30: qty 1

## 2024-09-30 MED ORDER — CHLORHEXIDINE GLUCONATE 0.12 % MT SOLN
15.0000 mL | Freq: Once | OROMUCOSAL | Status: AC
Start: 2024-09-30 — End: 2024-09-30
  Administered 2024-09-30: 15 mL via OROMUCOSAL

## 2024-09-30 MED ORDER — ONDANSETRON HCL 4 MG/2ML IJ SOLN
INTRAMUSCULAR | Status: AC
  Filled 2024-09-30: qty 4

## 2024-09-30 MED ORDER — CEFAZOLIN SODIUM-DEXTROSE 2-4 GM/100ML-% IV SOLN
2.0000 g | Freq: Three times a day (TID) | INTRAVENOUS | Status: AC
  Administered 2024-09-30 – 2024-10-01 (×3): 2 g via INTRAVENOUS
  Filled 2024-09-30 (×3): qty 100

## 2024-09-30 MED ORDER — LIDOCAINE 2% (20 MG/ML) 5 ML SYRINGE
INTRAMUSCULAR | Status: AC
  Filled 2024-09-30: qty 5

## 2024-09-30 MED ORDER — OXYCODONE HCL 5 MG/5ML PO SOLN: 5.0000 mg | Freq: Once | ORAL | Status: DC | PRN

## 2024-09-30 MED ORDER — LIDOCAINE HCL (CARDIAC) PF 100 MG/5ML IV SOSY
PREFILLED_SYRINGE | INTRAVENOUS | Status: DC | PRN
  Administered 2024-09-30: 50 mg via INTRAVENOUS

## 2024-09-30 MED ORDER — GLYCOPYRROLATE 0.2 MG/ML IJ SOLN
INTRAMUSCULAR | Status: DC | PRN
  Administered 2024-09-30: .1 mg via INTRAVENOUS

## 2024-09-30 MED ORDER — PHENYLEPHRINE 80 MCG/ML (10ML) SYRINGE FOR IV PUSH (FOR BLOOD PRESSURE SUPPORT)
PREFILLED_SYRINGE | INTRAVENOUS | Status: DC | PRN
  Administered 2024-09-30: 80 ug via INTRAVENOUS
  Administered 2024-09-30: 160 ug via INTRAVENOUS
  Administered 2024-09-30 (×2): 80 ug via INTRAVENOUS
  Administered 2024-09-30: 160 ug via INTRAVENOUS

## 2024-09-30 MED ORDER — ONDANSETRON HCL 4 MG/2ML IJ SOLN: 4.0000 mg | Freq: Once | INTRAMUSCULAR | Status: DC | PRN

## 2024-09-30 MED ORDER — SODIUM CHLORIDE 0.9 % IR SOLN
Status: DC | PRN
  Administered 2024-09-30: 1000 mL

## 2024-09-30 MED ORDER — BUPIVACAINE-EPINEPHRINE (PF) 0.5% -1:200000 IJ SOLN
INTRAMUSCULAR | Status: AC
  Filled 2024-09-30: qty 30

## 2024-09-30 MED ORDER — BUPIVACAINE-EPINEPHRINE 0.5% -1:200000 IJ SOLN
INTRAMUSCULAR | Status: DC | PRN
  Administered 2024-09-30: 30 mL

## 2024-09-30 SURGICAL SUPPLY — 47 items
BIT DRILL 4.0X165 AO STYLE (BIT) IMPLANT
BIT DRILL 4.0X280 (BIT) IMPLANT
BIT DRILL CALIBRATED AO 5.5 (DRILL) IMPLANT
BNDG ELASTIC 4X5.8 VLCR NS LF (GAUZE/BANDAGES/DRESSINGS) IMPLANT
CHLORAPREP W/TINT 26 (MISCELLANEOUS) ×2 IMPLANT
CLOTH BEACON ORANGE TIMEOUT ST (SAFETY) ×2 IMPLANT
CLSR STERI-STRIP ANTIMIC 1/2X4 (GAUZE/BANDAGES/DRESSINGS) IMPLANT
COVER LIGHT HANDLE STERIS (MISCELLANEOUS) ×4 IMPLANT
DRAPE C-ARM FOLDED MOBILE STRL (DRAPES) ×2 IMPLANT
DRAPE C-ARMOR (DRAPES) ×2 IMPLANT
DRAPE HIP W/POCKET STRL (MISCELLANEOUS) ×2 IMPLANT
DRSG TEGADERM 2-3/8X2-3/4 SM (GAUZE/BANDAGES/DRESSINGS) IMPLANT
DRSG TEGADERM 4X4.75 (GAUZE/BANDAGES/DRESSINGS) ×8 IMPLANT
ELECTRODE REM PT RTRN 9FT ADLT (ELECTROSURGICAL) ×2 IMPLANT
GAUZE SPONGE 4X4 12PLY STRL (GAUZE/BANDAGES/DRESSINGS) ×2 IMPLANT
GAUZE SPONGE 4X4 12PLY STRL LF (GAUZE/BANDAGES/DRESSINGS) IMPLANT
GLOVE BIO SURGEON STRL SZ8 (GLOVE) ×6 IMPLANT
GLOVE BIOGEL M 8.0 STRL (GLOVE) ×6 IMPLANT
GLOVE BIOGEL PI IND STRL 7.0 (GLOVE) ×4 IMPLANT
GLOVE BIOGEL PI IND STRL 8 (GLOVE) ×4 IMPLANT
GOWN STRL REUS W/TWL LRG LVL3 (GOWN DISPOSABLE) ×2 IMPLANT
GUIDEWIRE ORTH 900X3XBALL NOSE (WIRE) IMPLANT
INST SET MAJOR BONE (KITS) ×2 IMPLANT
KIT TURNOVER KIT A (KITS) ×2 IMPLANT
MANIFOLD NEPTUNE II (INSTRUMENTS) ×2 IMPLANT
MARKER SKIN DUAL TIP RULER LAB (MISCELLANEOUS) ×2 IMPLANT
NAIL FEM SUPRACONDYLAR 13X22 (Nail) IMPLANT
NDL HYPO 21X1.5 SAFETY (NEEDLE) ×2 IMPLANT
NEEDLE HYPO 21X1.5 SAFETY (NEEDLE) ×1 IMPLANT
NS IRRIG 1000ML POUR BTL (IV SOLUTION) ×2 IMPLANT
PACK TOTAL JOINT (CUSTOM PROCEDURE TRAY) ×2 IMPLANT
PAD ARMBOARD POSITIONER FOAM (MISCELLANEOUS) ×2 IMPLANT
PENCIL SMOKE EVACUATOR COATED (MISCELLANEOUS) ×2 IMPLANT
PIN GUIDE THRD AR 3.2X330 (PIN) IMPLANT
POSITIONER HEAD 8X9X4 ADT (SOFTGOODS) ×2 IMPLANT
SCREW CANC CAPT FT 6.5X55 (Screw) IMPLANT
SCREW CANC CAPT FT 6.5X65 (Screw) IMPLANT
SCREW CORT CAPTR 5X32 (Screw) IMPLANT
SCREW CORT CAPTR 5X34 (Screw) IMPLANT
SET BASIN LINEN APH (SET/KITS/TRAYS/PACK) ×2 IMPLANT
SOLN STERILE WATER BTL 1000 ML (IV SOLUTION) ×4 IMPLANT
STRIP CLOSURE SKIN 1/2X4 (GAUZE/BANDAGES/DRESSINGS) ×2 IMPLANT
SUT MNCRL AB 4-0 PS2 18 (SUTURE) IMPLANT
SUT MON AB 2-0 CT1 36 (SUTURE) ×4 IMPLANT
SUT VIC AB 2-0 CT1 TAPERPNT 27 (SUTURE) ×2 IMPLANT
SYR 30ML LL (SYRINGE) IMPLANT
SYR BULB IRRIG 60ML STRL (SYRINGE) ×4 IMPLANT

## 2024-09-30 NOTE — Anesthesia Procedure Notes (Signed)
 Procedure Name: LMA Insertion Date/Time: 09/30/2024 11:50 AM  Performed by: Pheobe Adine CROME, CRNAPre-anesthesia Checklist: Patient identified, Emergency Drugs available, Suction available, Patient being monitored and Timeout performed Patient Re-evaluated:Patient Re-evaluated prior to induction Oxygen Delivery Method: Circle system utilized Preoxygenation: Pre-oxygenation with 100% oxygen Induction Type: IV induction LMA: LMA inserted LMA Size: 3.0 Number of attempts: 1 Placement Confirmation: positive ETCO2, CO2 detector and breath sounds checked- equal and bilateral Tube secured with: Tape

## 2024-09-30 NOTE — Op Note (Signed)
 Orthopaedic Surgery Operative Note (CSN: [id number])  [name]  1935-12-29 Date of Surgery: 09/30/2024   Diagnoses:  Left distal femoral shaft fracture  Procedure: Retrograde intramedullary nail for a comminuted left distal femoral shaft fracture   Operative Finding Successful completion of the planned procedure.  Placement of a 13 mm x 220 mm supracondylar retrograde nail.  Significant comminution in the distal femur, with adequate reduction.   Post-Op Diagnosis: Same Surgeons:Primary: Onesimo Oneil LABOR, MD Assistants: Montie Seltzer Location: [facility] OR ROOM 4 Anesthesia: General with local anesthesia Antibiotics: Ancef  2 g Tourniquet time: N/A Estimated Blood Loss: 200 cc Complications: None Specimens: None  Implants: Implant Name Type Inv. Item Serial No. Manufacturer Lot No. LRB No. Used Action  supracondylar retrograde femoral nail Femur   [employer] [id number] Left 1 Implanted  cancellous screw Femur   [employer] [id number] Left 1 Implanted  cancellous screw captured FT  6.5x81mm Screw   [employer] [id number] Left 2 Implanted  Cortical Screw, captured 5.0x26mm Screw   [employer] [id number] Left 1 Implanted  Cortical screw, aptured 5.0x82mm Screw   [employer] [id number] Left 1 Implanted  Cortical screw, captured, 5.0x21mm Screw   [employer] [id number] Left 1 Implanted    Indications for Surgery:   [name] is a 88 y.o. female who sustained a mechanical fall, and injured the left femur.  She has a comminuted left distal femoral shaft fracture.  Due to the nature of the injury, I recommended surgery in order to restore form and function.  Procedure was discussed in great detail.  Benefits and risks of operative and nonoperative management were discussed prior to surgery with the patient and informed consent form was completed.  Specific risks including infection, need for additional surgery, stiffness, nonunion, malunion, weakness, blood clots and more severe complications  associated with anesthesia.  She would like to proceed.  Surgical consent was finalized.   Procedure:   The patient was identified properly. Informed consent was obtained and the surgical site was marked. The patient was taken to the OR where general anesthesia was induced.  The patient was positioned supine.  The left leg was prepped and draped in the usual sterile fashion.  Timeout was performed before the beginning of the case.  She received 2 g of Ancef  and 1 g of TXA prior to making incision.  We used a sterile triangle to pull traction, in order to reduce the comminuted distal femoral shaft fracture.  This was confirmed under fluoroscopy.  Once we are satisfied that we could get an adequate reduction, we proceeded to start surgery.  The patella was identified.  We incised sharply in line with the fibers of the patellar tendon.  We incised through the skin, and then dissected the peritenon off of the tendon.  The medial lateral borders of the patella tendon were identified.  We then made a sharp incision, in line with the fibers in the mid aspect of the patella tendon.  This was completed through the full-thickness of the tendon.  At this point, we were within the joint.  There was a large hematoma within the joint.  This was expressed and irrigated.  We then inserted a guidepin, and with the assistance of fluoroscopy, we confirmed that we were in appropriate position on both the AP and the lateral.  Due to preoperative templating, we knew that she had poor overall bone quality.  As such, we plan to proceed with a large nail.  An entry reamer  was used.  The ball-tipped guidewire was fed in retrograde fashion to the level of the distal aspect of the hip prosthesis.  We confirmed our length.  Once again, under AP and lateral views under fluoroscopy, we confirmed an adequate reduction.    The above-stated nail was then inserted in retrograde fashion across the fracture site.  We confirmed that the tip of  the nail was countersunk to an appropriate level.  Once were satisfied with the position of the nail, as well as the overall reduction, we used the outrigger to place multiple screws in the distal fragment.  We started with a lateral to medial screw, which is threaded into the nail.  Next, we placed oblique screws, that once again threaded through the nail and help to secure the distal fragment.  The fracture fragments were once again manipulated, fluoroscopy confirmed that we had an adequate reduction.  We then used the outrigger to place 3 static screws from lateral to medial in the more proximal aspect of the nail.  There was a small gap between the proximal tip of the nail, and the distal extent of the hip prosthesis.  There was significant comminution of the distal femoral shaft, but we felt as though we had a good read on the medial cortex.  The outrigger was removed, final fluoroscopic images confirmed overall reduction, as well as placement of the nail and screws.  We irrigated the wound copiously.  We closed the incision in a multilayer fashion with absorbable suture.  Sterile dressing was placed.  Patient was awoken taken to PACU in stable condition.   Post-operative plan:  The patient will be WBAT on the operative extremity.  To continue to you wear the knee immobilizer at all times while ambulating Range of motion as tolerated of the left knee. Discharge home from the PACU once they have recovered DVT prophylaxis: Recommend Aspirin  81 mg twice daily for 6 weeks.   Please hold until 24 hours after surgery Pain control with PRN pain medication preferring oral medicines.   Follow up plan will be scheduled in approximately 10-14 days for incision check and XR.  If she is going to be discharged to a nursing facility, the dressings can be removed, and sutures trimmed around 2 weeks after surgery.  If this is completed, I am happy see the patient at 6 weeks following surgery.  If there are any  issues or complications the patient can be seen in clinic at any time.

## 2024-09-30 NOTE — Progress Notes (Signed)
   ORTHOPAEDIC PROGRESS NOTE  Scheduled for retrograde Left nail for left distal femur fracture   DOS: 09/30/24  SUBJECTIVE: No issues overnight pain is controlled when she is not moving.  Consent has been completed.  Nothing to eat or drink since midnight.  Her daughter is at the bedside.  OBJECTIVE: PE:  Alert and oriented.  No acute distress  Left knee is in a knee immobilizer.  Toes warm and well-perfused.  Ankle is internally rotated.  2+ DP pulse.  Vitals:   09/30/24 0417 09/30/24 1104  BP: 136/72 (!) 146/59  Pulse: 82 71  Resp: 18 16  Temp: 99.4 F (37.4 C) 98.4 F (36.9 C)  SpO2: 96% 96%       Latest Ref Rng & Units 09/30/2024    9:24 AM 09/28/2024    4:40 PM 04/29/2024    1:07 PM  CBC  WBC 4.0 - 10.5 K/uL  11.0  6.5   Hemoglobin 12.0 - 15.0 g/dL 89.3  88.6  88.5   Hematocrit 36.0 - 46.0 % 34.2  36.4  39.1   Platelets 150 - 400 K/uL  196  171       ASSESSMENT: [name] is a 88 y.o. female doing well.  Ready for OR.  NPO since midnight.   PLAN: Weightbearing: NWB LLE Incisional and dressing care: Reinforce dressings as needed; none currently Orthopedic device(s): None VTE prophylaxis: Recommend Aspirin  81mg  BID; to begin POD#1 Pain control: PO pain medications as needed; judicious use of narcotics Follow - up plan: 2 weeks   Contact information:     Alissa Pharr A. Onesimo, MD MS [facility] [address] [place],  [place]  [address] Phone: [phone] Fax: [phone]

## 2024-09-30 NOTE — NC FL2 (Signed)
 [place]  MEDICAID [id number] LEVEL OF CARE FORM     IDENTIFICATION  Patient Name: [name] Birthdate: 02/24/1936 Sex: female Admission Date (Current Location): 09/28/2024  [facility] and [place] Number:  [employer] and Address:  [facility],  [address], [place] [address]      Provider Number: [id number]  Attending Physician Name and Address:  Mcarthur Pick, MD  Relative Name and Phone Number:  Sharlet Breen ( daughter ) [phone]    Current Level of Care: Hospital Recommended Level of Care: Skilled Nursing Facility Prior Approval Number:    Date Approved/Denied:   PASRR Number: [id number]  Discharge Plan: SNF    Current Diagnoses: Patient Active Problem List   Diagnosis Date Noted   Closed displaced comminuted fracture of shaft of left femur (HCC) 09/28/2024   Vitamin D  deficiency 08/23/2024   Hypothyroid 08/23/2024   Primary hypertension 08/23/2024   Generalized anxiety disorder with panic attacks 08/23/2024   H/O total hip arthroplasty, left 10/01/2018   Uterovaginal prolapse, complete 10/12/2013   Polycythemia vera (HCC) 02/25/2013    Orientation RESPIRATION BLADDER Height & Weight     Self, Time, Situation, Place  Normal External catheter Weight: 105 lb 9.6 oz (47.9 kg) Height:  5' 1 (154.9 cm)  BEHAVIORAL SYMPTOMS/MOOD NEUROLOGICAL BOWEL NUTRITION STATUS      Continent  (See DC summary)  AMBULATORY STATUS COMMUNICATION OF NEEDS Skin   Extensive Assist Verbally Normal                       Personal Care Assistance Level of Assistance  Bathing, Feeding, Dressing Bathing Assistance: Maximum assistance Feeding assistance: Independent Dressing Assistance: Maximum assistance     Functional Limitations Info  Sight, Hearing, Speech Sight Info: Impaired (wears eyeglasses) Hearing Info: Adequate Speech Info: Adequate    SPECIAL CARE FACTORS FREQUENCY  PT (By licensed PT), OT (By licensed OT)     PT Frequency: 5 x a  week OT Frequency: 5 x a week            Contractures Contractures Info: Not present    Additional Factors Info  Code Status, Allergies Code Status Info: FULL Allergies Info: Codeine, Diovan (Valsartan), Lamisil (Terbinafine), Penicillins           Current Medications (09/30/2024):  This is the current hospital active medication list Current Facility-Administered Medications  Medication Dose Route Frequency Provider Last Rate Last Admin   [MAR Hold] acetaminophen  (TYLENOL ) tablet 650 mg  650 mg Oral Q6H PRN Emokpae, Ejiroghene E, MD       Or   [MAR Hold] acetaminophen  (TYLENOL ) suppository 650 mg  650 mg Rectal Q6H PRN Emokpae, Ejiroghene E, MD       [MAR Hold] diltiazem  (CARDIZEM  CD) 24 hr capsule 180 mg  180 mg Oral Daily Emokpae, Ejiroghene E, MD   180 mg at 09/30/24 0811   fentaNYL  (SUBLIMAZE ) injection 25-50 mcg  25-50 mcg Intravenous Q5 min PRN Kendell Yvonna PARAS, MD       [MAR Hold] HYDROmorphone  (DILAUDID ) injection 0.5 mg  0.5 mg Intravenous Q4H PRN Emokpae, Ejiroghene E, MD   0.5 mg at 09/29/24 1518   [MAR Hold] hydroxyurea  (HYDREA ) capsule 500 mg  500 mg Oral Once per day on Monday Tuesday Wednesday Thursday Friday Emokpae, Ejiroghene E, MD   500 mg at 09/30/24 9188   lactated ringers  infusion   Intravenous Continuous Kendell Yvonna PARAS, MD 10 mL/hr at 09/30/24 1144 New Bag at 09/30/24  1302   [MAR Hold] levothyroxine  (SYNTHROID ) tablet 50 mcg  50 mcg Oral QAC breakfast Emokpae, Ejiroghene E, MD   50 mcg at 09/30/24 0812   [MAR Hold] ondansetron  (ZOFRAN ) tablet 4 mg  4 mg Oral Q6H PRN Emokpae, Ejiroghene E, MD       Or   [MAR Hold] ondansetron  (ZOFRAN ) injection 4 mg  4 mg Intravenous Q6H PRN Emokpae, Ejiroghene E, MD   4 mg at 09/30/24 1202   ondansetron  (ZOFRAN ) injection 4 mg  4 mg Intravenous Once PRN Kiel, Brendan J, MD       oxyCODONE  (Oxy IR/ROXICODONE ) immediate release tablet 5 mg  5 mg Oral Once PRN Kiel, Brendan J, MD       Or   oxyCODONE  (ROXICODONE ) 5 MG/5ML  solution 5 mg  5 mg Oral Once PRN Kiel, Brendan J, MD       [MAR Hold] polyethylene glycol (MIRALAX / GLYCOLAX) packet 17 g  17 g Oral Daily PRN Emokpae, Ejiroghene E, MD         Discharge Medications: Please see discharge summary for a list of discharge medications.  Relevant Imaging Results:  Relevant Lab Results:   Additional Information SSN: [id number]  Noreen KATHEE P[place], [place]

## 2024-09-30 NOTE — Plan of Care (Signed)

## 2024-09-30 NOTE — Progress Notes (Signed)
 PROGRESS NOTE    [name]  FMW:[id number] DOB: October 05, 1936 DOA: 09/28/2024 PCP: Mancil Pfeiffer, PA-C   Brief Narrative:  [name] is a 88 y.o. female with medical history significant for hypertension, hypothyroidism, polycythemia vera, anxiety. Patient presented to the ED with reports of a fall.  Patient was holding a cane and trying to open door of her car, after shopping at [facility].  Her knee twisted and she fell onto the floor.   Assessment & Plan:   Principal Problem:   Closed displaced comminuted fracture of shaft of left femur (HCC) Active Problems:   Polycythemia vera (HCC)   Hypothyroid   Primary hypertension   Generalized anxiety disorder with panic attacks   Assessment and Plan:   Closed displaced comminuted fracture of left femur-sustained by mechanical fall.  X-ray shows - acute comminuted distal femoral diaphyseal fracture with mild apex anterior angulation. - Plan for surgical fixation of distal femur per orthopedics 10/16. -IV morphine 2 mg every 4 hours as needed.  Patient has however not utilized it.  Says pain medications makes her sick. -Will engage PT/OT postprocedure.   Polycythemia vera -Resume hydroxyurea    Hypertension - Resume Cardizem    Hypothyroidism - Resume Synthroid     DVT prophylaxis: SCDS, aspirin  postprocedure Code Status: FULL code Family Communication: none at the bedside Disposition Plan: ~ 2 days Consults called: Orthopedics Admission status: Inpt med surg I certify that at the point of admission it is my clinical judgment that the patient will require inpatient hospital care spanning beyond 2 midnights from the point of admission due to high intensity of service, high risk for further deterioration and high frequency of surveillance required.     Subjective:   Objective: Vitals:   09/29/24 1323 09/29/24 2143 09/30/24 0417 09/30/24 1104  BP: 135/64 (!) 154/75 136/72 (!) 146/59  Pulse: 79 88 82 71  Resp:  17 20 18 16   Temp: 98.9 F (37.2 C) 98.1 F (36.7 C) 99.4 F (37.4 C) 98.4 F (36.9 C)  TempSrc: Oral Oral Oral Oral  SpO2: 93% 91% 96% 96%  Weight:    47.9 kg  Height:    5' 1 (1.549 m)    Intake/Output Summary (Last 24 hours) at 09/30/2024 1117 Last data filed at 09/30/2024 0418 Gross per 24 hour  Intake --  Output 550 ml  Net -550 ml   Filed Weights   09/28/24 1517 09/28/24 2201 09/30/24 1104  Weight: 47.6 kg 47.9 kg 47.9 kg    Examination:  General: Alert, oriented not in any acute distress Chest: Clear to auscultation bilaterally CVs: S1, S2, no murmur, regular rhythm Abdomen: Soft, nontender Extremities: No edema bilaterally, left lower extremity is rotated inward, pulses strong bilateral dorsalis pedis.    Data Reviewed: I have personally reviewed following labs and imaging studies  CBC: Recent Labs  Lab 09/28/24 1640 09/30/24 0924  WBC 11.0*  --   NEUTROABS 7.8*  --   HGB 11.3* 10.6*  HCT 36.4 34.2*  MCV 84.5  --   PLT 196  --    Basic Metabolic Panel: Recent Labs  Lab 09/28/24 1640  NA 136  K 4.0  CL 101  CO2 24  GLUCOSE 120*  BUN 17  CREATININE 0.65  CALCIUM 8.6*   GFR: Estimated Creatinine Clearance: 38.1 mL/min (by C-G formula based on SCr of 0.65 mg/dL). Liver Function Tests: Recent Labs  Lab 09/28/24 1640  AST 28  ALT 16  ALKPHOS 146*  BILITOT 0.6  PROT 6.6  ALBUMIN 3.8   No results for input(s): LIPASE, AMYLASE in the last 168 hours. No results for input(s): AMMONIA in the last 168 hours. Coagulation Profile: No results for input(s): INR, PROTIME in the last 168 hours. Cardiac Enzymes: No results for input(s): CKTOTAL, CKMB, CKMBINDEX, TROPONINI in the last 168 hours. BNP (last 3 results) No results for input(s): PROBNP in the last 8760 hours. HbA1C: No results for input(s): HGBA1C in the last 72 hours. CBG: No results for input(s): GLUCAP in the last 168 hours. Lipid Profile: No results for  input(s): CHOL, HDL, LDLCALC, TRIG, CHOLHDL, LDLDIRECT in the last 72 hours. Thyroid  Function Tests: No results for input(s): TSH, T4TOTAL, FREET4, T3FREE, THYROIDAB in the last 72 hours. Anemia Panel: No results for input(s): VITAMINB12, FOLATE, FERRITIN, TIBC, IRON, RETICCTPCT in the last 72 hours. Sepsis Labs: No results for input(s): PROCALCITON, LATICACIDVEN in the last 168 hours.  No results found for this or any previous visit (from the past 240 hours).       Radiology Studies: DG CHEST PORT 1 VIEW Result Date: 09/28/2024 EXAM: 1 VIEW(S) XRAY OF THE CHEST 09/28/2024 07:56:00 PM COMPARISON: None available. CLINICAL HISTORY: Pre-op chest exam [phone]. Pre op. No chest complaints. FINDINGS: LUNGS AND PLEURA: Mild biapical pleural parenchymal scarring. No pulmonary edema. No pleural effusion. No pneumothorax. HEART AND MEDIASTINUM: Atherosclerotic calcifications. No acute abnormality of the cardiac and mediastinal silhouettes. BONES AND SOFT TISSUES: No acute osseous abnormality. IMPRESSION: 1. No acute process. Electronically signed by: Pinkie Pebbles MD 09/28/2024 08:05 PM EDT RP Workstation: [id number]   DG Femur Min 2 Views Left Result Date: 09/28/2024 EXAM: 2 VIEW(S) XRAY OF THE LEFT FEMUR 09/28/2024 04:53:00 PM COMPARISON: Comparison 01/11/2024. CLINICAL HISTORY: Fall. Patient fell while trying to get into car. She complains of left knee pain. Patient has limited mobility of left hip and leg. FINDINGS: BONES AND JOINTS: Status post left hip arthroplasty. Severely comminuted distal left femoral shaft fracture is noted. SOFT TISSUES: The soft tissues are unremarkable. IMPRESSION: 1. Severely comminuted distal left femoral shaft fracture. Electronically signed by: Lynwood Seip MD 09/28/2024 05:12 PM EDT RP Workstation: [id number]   DG Knee Complete 4 Views Left Result Date: 09/28/2024 EXAM: 4 OR MORE VIEW(S) XRAY OF THE LEFT KNEE 09/28/2024  03:47:00 PM COMPARISON: None available. CLINICAL HISTORY: pain. She c/o left knee pain. FINDINGS: BONES AND JOINTS: There is an acute transverse on oblique comminuted fracture of the distal femoral diaphysis. There is mild apex anterior angulation. The bones are osteopenic. No focal osseous lesion. No dislocation identified. No significant joint effusion. There are moderate degenerative changes of the lateral compartment of the knee with joint space narrowing and osteophyte formation. SOFT TISSUES: There is surrounding soft tissue swelling and edema/hematoma. IMPRESSION: 1. Acute comminuted distal femoral diaphyseal fracture with mild apex anterior angulation. 2. Moderate lateral compartment knee osteoarthritis. 3. Osteopenia. Electronically signed by: Greig Pique MD 09/28/2024 03:54 PM EDT RP Workstation: [id number]        Scheduled Meds:  chlorhexidine   15 mL Mouth/Throat Once   Or   mouth rinse  15 mL Mouth Rinse Once   [MAR Hold] diltiazem   180 mg Oral Daily   [MAR Hold] hydroxyurea   500 mg Oral Once per day on Monday Tuesday Wednesday Thursday Friday   [facility] Hold] levothyroxine   50 mcg Oral QAC breakfast   Continuous Infusions:  [MAR Hold]  ceFAZolin  (ANCEF ) IV     lactated ringers  10 mL/hr at 09/30/24 1116   tranexamic acid     [  MAR Hold] tranexamic acid            Aleena Kirkeby, MD [facility] 09/30/2024, 11:17 AM

## 2024-09-30 NOTE — TOC Progression Note (Signed)
 Transition of Care [facility]) - Progression Note    Patient Details  Name: [name] MRN: [id number] Date of Birth: 1936-06-09  [facility] CM/SW Contact  Noreen KATHEE P[place], [place] Phone Number: 09/30/2024, 1:54 PM  Clinical Narrative:     CSW spoke with patient daughter. Daughter stated that patient will likely need to go to SNF for short-term rehab after surgery. Daughter provided CSW with her choice of facilities which is [facility] and [facility]. CSW will follow and work on referral process.    Barriers to Discharge: Continued Medical Work up     Expected Discharge Plan and Services     Post Acute Care Choice: Durable Medical Equipment Living arrangements for the past 2 months: Single Family Home                   Social Drivers of Health (SDOH) Interventions SDOH Screenings   Food Insecurity: No Food Insecurity (09/28/2024)  Housing: Low Risk  (09/28/2024)  Transportation Needs: No Transportation Needs (09/28/2024)  Utilities: Not At Risk (09/28/2024)  Depression (PHQ2-9): Low Risk  (08/23/2024)  Physical Activity: Insufficiently Active (08/23/2024)  Social Connections: Socially Isolated (09/28/2024)  Stress: Stress Concern Present (08/23/2024)  Tobacco Use: Low Risk  (09/30/2024)    Readmission Risk Interventions    09/30/2024    1:52 PM 09/29/2024    1:57 PM  Readmission Risk Prevention Plan  Medication Screening Complete Complete  Transportation Screening Complete Complete

## 2024-09-30 NOTE — Transfer of Care (Signed)
 Immediate Anesthesia Transfer of Care Note  Patient: [name]  Procedure(s) Performed: INSERTION, INTRAMEDULLARY ROD, FEMUR, RETROGRADE (Left: Leg Upper)  Patient Location: PACU  Anesthesia Type:General  Level of Consciousness: drowsy, patient cooperative, and responds to stimulation  Airway & Oxygen Therapy: Patient Spontanous Breathing and Patient connected to face mask oxygen  Post-op Assessment: Report given to RN and Post -op Vital signs reviewed and stable  Post vital signs: Reviewed and stable  Last Vitals:  Vitals Value Taken Time  BP 109/53 09/30/24 14:00  Temp    Pulse 65 09/30/24 14:02  Resp 14 09/30/24 14:02  SpO2 93 % 09/30/24 14:02  Vitals shown include unfiled device data.  Last Pain:  Vitals:   09/30/24 1104  TempSrc: Oral  PainSc: 3          Complications: No notable events documented.

## 2024-09-30 NOTE — Anesthesia Preprocedure Evaluation (Signed)
 Anesthesia Evaluation  Patient identified by MRN, date of birth, ID band Patient awake    Reviewed: Allergy & Precautions, H&P , NPO status , Patient's Chart, lab work & pertinent test results, reviewed documented beta blocker date and time   Airway Mallampati: II  TM Distance: >3 FB Neck ROM: full    Dental no notable dental hx.    Pulmonary neg pulmonary ROS   Pulmonary exam normal breath sounds clear to auscultation       Cardiovascular Exercise Tolerance: Good hypertension,  Rhythm:regular Rate:Normal     Neuro/Psych  PSYCHIATRIC DISORDERS Anxiety     negative neurological ROS     GI/Hepatic negative GI ROS, Neg liver ROS,,,  Endo/Other  Hypothyroidism    Renal/GU negative Renal ROS  negative genitourinary   Musculoskeletal   Abdominal   Peds  Hematology negative hematology ROS (+)   Anesthesia Other Findings   Reproductive/Obstetrics negative OB ROS                              Anesthesia Physical Anesthesia Plan  ASA: 2 and emergent  Anesthesia Plan: General and General LMA   Post-op Pain Management:    Induction:   PONV Risk Score and Plan: Ondansetron   Airway Management Planned:   Additional Equipment:   Intra-op Plan:   Post-operative Plan:   Informed Consent: I have reviewed the patients History and Physical, chart, labs and discussed the procedure including the risks, benefits and alternatives for the proposed anesthesia with the patient or authorized representative who has indicated his/her understanding and acceptance.     Dental Advisory Given  Plan Discussed with: CRNA  Anesthesia Plan Comments:         Anesthesia Quick Evaluation

## 2024-10-01 DIAGNOSIS — S72352A Displaced comminuted fracture of shaft of left femur, initial encounter for closed fracture: Secondary | ICD-10-CM | POA: Diagnosis not present

## 2024-10-01 LAB — BASIC METABOLIC PANEL WITH GFR
Anion gap: 9 (ref 5–15)
BUN: 17 mg/dL (ref 8–23)
CO2: 24 mmol/L (ref 22–32)
Calcium: 8.1 mg/dL — ABNORMAL LOW (ref 8.9–10.3)
Chloride: 97 mmol/L — ABNORMAL LOW (ref 98–111)
Creatinine, Ser: 0.66 mg/dL (ref 0.44–1.00)
GFR, Estimated: >60 mL/min (ref >60)
Glucose, Bld: 118 mg/dL — ABNORMAL HIGH (ref 70–99)
Potassium: 4.3 mmol/L (ref 3.5–5.1)
Sodium: 130 mmol/L — ABNORMAL LOW (ref 135–145)

## 2024-10-01 LAB — CBC WITH DIFFERENTIAL/PLATELET
Basophils Absolute: 0 K/uL (ref 0.0–0.1)
Basophils Relative: 0 %
Eosinophils Absolute: 0 K/uL (ref 0.0–0.5)
Eosinophils Relative: 0 %
HCT: 32 % — ABNORMAL LOW (ref 36.0–46.0)
Hemoglobin: 9.9 g/dL — ABNORMAL LOW (ref 12.0–15.0)
Lymphocytes Relative: 5 %
Lymphs Abs: 1.1 K/uL (ref 0.7–4.0)
MCH: 25.8 pg — ABNORMAL LOW (ref 26.0–34.0)
MCHC: 30.9 g/dL (ref 30.0–36.0)
MCV: 83.6 fL (ref 80.0–100.0)
Monocytes Absolute: 0.7 K/uL (ref 0.1–1.0)
Monocytes Relative: 3 %
Neutro Abs: 20.6 K/uL — ABNORMAL HIGH (ref 1.7–7.7)
Neutrophils Relative %: 92 %
Platelets: 235 K/uL (ref 150–400)
RBC: 3.83 MIL/uL — ABNORMAL LOW (ref 3.87–5.11)
RDW: 21.1 % — ABNORMAL HIGH (ref 11.5–15.5)
Smear Review: NORMAL
WBC: 22.4 K/uL — ABNORMAL HIGH (ref 4.0–10.5)
nRBC: 0 % (ref 0.0–0.2)

## 2024-10-01 MED ORDER — ASPIRIN 81 MG PO TBEC
81.0000 mg | DELAYED_RELEASE_TABLET | Freq: Two times a day (BID) | ORAL | Status: DC
  Administered 2024-10-01 – 2024-10-02 (×2): 81 mg via ORAL
  Filled 2024-10-01 (×2): qty 1

## 2024-10-01 NOTE — Progress Notes (Signed)
   ORTHOPAEDIC PROGRESS NOTE  S/pretrograde Left nail for left distal femur fracture   DOS: 09/30/24  SUBJECTIVE: No issues overnight.  She states that her pain is well-controlled.  She was able to work with therapy.  She was able to take some steps in the room.  She was sitting upright in the chair for several hours.  OBJECTIVE: PE:  Alert and oriented.  No acute distress  Surgical dressings are clean, dry and intact.  Mild swelling of the distal lower leg.  Left knee is in a knee immobilizer.  Toes warm and well-perfused.  2+ DP pulse.  Vitals:   10/01/24 1300 10/01/24 2002  BP: (!) 120/50 (!) 151/65  Pulse: 79 (!) 43  Resp: 18 17  Temp: 98 F (36.7 C) 98.4 F (36.9 C)  SpO2: 95% 92%       Latest Ref Rng & Units 10/01/2024    4:30 AM 09/30/2024    9:24 AM 09/28/2024    4:40 PM  CBC  WBC 4.0 - 10.5 K/uL 22.4   11.0   Hemoglobin 12.0 - 15.0 g/dL 9.9  89.3  88.6   Hematocrit 36.0 - 46.0 % 32.0  34.2  36.4   Platelets 150 - 400 K/uL 235   196       ASSESSMENT: [name] is a 88 y.o. female doing well POD#1  PLAN: Weightbearing: WBAT LLE while wearing the knee immobilizer. Incisional and dressing care: Reinforce dressings as needed Orthopedic device(s): None VTE prophylaxis: Recommend Aspirin  81mg  BID; to begin POD#1 Pain control: PO pain medications as needed; judicious use of narcotics Follow - up plan: 2 weeks  Dressings can be changed and sutures trimmed in 2 weeks, if she is discharged to a nursing facility.  If there are no concerns, I can see her in clinic 6 weeks after surgery.  I am happy to see her in clinic at any time if there are any questions or concerns.SABRA Pass information:     Yessika Otte A. Onesimo, MD MS [facility] [address] [place],  [place]  [address] Phone: [phone] Fax: [phone]

## 2024-10-01 NOTE — Evaluation (Signed)
 Occupational Therapy Evaluation Patient Details Name: [name] MRN: [id number] DOB: 02-14-1936 Today's Date: 10/01/2024   History of Present Illness   [name] is a 88 y.o. female with medical history significant for hypertension, hypothyroidism, polycythemia vera, anxiety.  Patient presented to the ED with reports of a fall.  Patient was holding a cane and trying to open door of her car, after shopping at [facility].  Her knee twisted and she fell onto the floor.  She denies dizziness, no chest pain or difficulty breathing.  She has maintained good oral intake.  No GI losses.    She has had prior falls, sustaining fractures to her left arm - 12/2022 requiring surgery, has also had left hip arthroplasty in 2019. Status post Retrograde intramedullary nail for a comminuted left distal femoral shaft fracture.     Clinical Impressions Pt agreeable to OT and PT co-evaluation. Pt lives alone and is independent at baseline with use of quad and single point cane. Pt required min to mod A for bed mobility and mod A for EOB to chair transfer with RW. Pt very unsteady in standing with much labored effort ant time to get to chair. Max A for lower body ADL's per difficulty when attempting to don socks at EOB today. B UE generally weak but WFL. Pt left in the chair with call bell within reach. Pt will benefit from continued OT in the hospital to increase strength, balance, and endurance for safe ADL's.        If plan is discharge home, recommend the following:   A lot of help with walking and/or transfers;A lot of help with bathing/dressing/bathroom;Assistance with cooking/housework;Assist for transportation;Help with stairs or ramp for entrance     Functional Status Assessment   Patient has had a recent decline in their functional status and demonstrates the ability to make significant improvements in function in a reasonable and predictable amount of time.     Equipment  Recommendations   None recommended by OT     Recommendations for Other Services         Precautions/Restrictions   Precautions Precautions: Fall Recall of Precautions/Restrictions: Intact Restrictions Weight Bearing Restrictions Per Provider Order: Yes LLE Weight Bearing Per Provider Order: Weight bearing as tolerated     Mobility Bed Mobility Overal bed mobility: Needs Assistance Bed Mobility: Supine to Sit     Supine to sit: Min assist, Mod assist     General bed mobility comments: labored movement; assist to move R LE to EOB.    Transfers Overall transfer level: Needs assistance Equipment used: Rolling walker (2 wheels) Transfers: Sit to/from Stand, Bed to chair/wheelchair/BSC Sit to Stand: Mod assist     Step pivot transfers: Mod assist     General transfer comment: Extended time and labored effort with RW.      Balance Overall balance assessment: Needs assistance Sitting-balance support: No upper extremity supported, Feet supported Sitting balance-Leahy Scale: Fair Sitting balance - Comments: fair to good seated at EOB   Standing balance support: Bilateral upper extremity supported, During functional activity, Reliant on assistive device for balance Standing balance-Leahy Scale: Poor Standing balance comment: using RW                           ADL either performed or assessed with clinical judgement   ADL Overall ADL's : Needs assistance/impaired     Grooming: Set up;Sitting   Upper Body Bathing: Set up;Sitting  Lower Body Bathing: Maximal assistance;Sitting/lateral leans   Upper Body Dressing : Set up;Sitting   Lower Body Dressing: Maximal assistance;Sitting/lateral leans   Toilet Transfer: Moderate assistance;Stand-pivot;Rolling walker (2 wheels) Toilet Transfer Details (indicate cue type and reason): EOB to chair with RW Toileting- Clothing Manipulation and Hygiene: Maximal assistance;Moderate assistance;Sitting/lateral  lean               Vision Baseline Vision/History: 1 Wears glasses;4 Cataracts Ability to See in Adequate Light: 2 Moderately impaired Patient Visual Report: No change from baseline Vision Assessment?: No apparent visual deficits     Perception Perception: Not tested       Praxis Praxis: Not tested       Pertinent Vitals/Pain Pain Assessment Pain Assessment: No/denies pain     Extremity/Trunk Assessment Upper Extremity Assessment Upper Extremity Assessment: Generalized weakness   Lower Extremity Assessment Lower Extremity Assessment: Defer to PT evaluation   Cervical / Trunk Assessment Cervical / Trunk Assessment: Kyphotic   Communication Communication Communication: No apparent difficulties   Cognition Arousal: Alert Behavior During Therapy: WFL for tasks assessed/performed Cognition: No apparent impairments                               Following commands: Intact       Cueing  General Comments   Cueing Techniques: Verbal cues;Tactile cues                 Home Living Family/patient expects to be discharged to:: Private residence Living Arrangements: Alone Available Help at Discharge: Family;Available 24 hours/day Type of Home: House Home Access: Stairs to enter [employer] of Steps: 3 steps in front without side rails, 7 steps in back with bilateral side rails - to wide to reach both Entrance Stairs-Rails: None;Right;Left Home Layout: One level     Bathroom Shower/Tub: Chief Strategy Officer: Standard Bathroom Accessibility: Yes How Accessible: Accessible via wheelchair Home Equipment: Rolling Walker (2 wheels);Cane - single point;BSC/3in1;Shower seat;Cane - quad;Grab bars - tub/shower;Adaptive equipment Adaptive Equipment: Reacher;Sock aid Additional Comments: Pt reports living in the same home as prior admission.      Prior Functioning/Environment Prior Level of Function : Independent/Modified  Independent;Driving             Mobility Comments: SPC used in home; quad cane used in community. ADLs Comments: Independent    OT Problem List: Decreased strength;Decreased activity tolerance;Impaired balance (sitting and/or standing)   OT Treatment/Interventions: Therapeutic exercise;Self-care/ADL training;DME and/or AE instruction;Therapeutic activities;Patient/family education;Balance training      OT Goals(Current goals can be found in the care plan section)   Acute Rehab OT Goals Patient Stated Goal: improve function OT Goal Formulation: With patient Time For Goal Achievement: 10/15/24 Potential to Achieve Goals: Good   OT Frequency:  Min 3X/week    Co-evaluation PT/OT/SLP Co-Evaluation/Treatment: Yes Reason for Co-Treatment: To address functional/ADL transfers   OT goals addressed during session: ADL's and self-care                       End of Session Equipment Utilized During Treatment: Rolling walker (2 wheels);Gait belt  Activity Tolerance: Patient tolerated treatment well Patient left: in chair;with call bell/phone within reach  OT Visit Diagnosis: Unsteadiness on feet (R26.81);Other abnormalities of gait and mobility (R26.89);Muscle weakness (generalized) (M62.81);History of falling (Z91.81)                Time: 9074-9047 OT Time Calculation (  min): 27 min Charges:  OT General Charges $OT Visit: 1 Visit OT Evaluation $OT Eval Low Complexity: 1 Low  Keionte Swicegood OT, MOT  Jayson Person 10/01/2024, 11:07 AM

## 2024-10-01 NOTE — Anesthesia Postprocedure Evaluation (Signed)
 Anesthesia Post Note  Patient: [name]  Procedure(s) Performed: INSERTION, INTRAMEDULLARY ROD, FEMUR, RETROGRADE (Left: Leg Upper)  Patient location during evaluation: Phase II Anesthesia Type: General Level of consciousness: awake Pain management: pain level controlled Vital Signs Assessment: post-procedure vital signs reviewed and stable Respiratory status: spontaneous breathing and respiratory function stable Cardiovascular status: blood pressure returned to baseline and stable Postop Assessment: no headache and no apparent nausea or vomiting Anesthetic complications: no Comments: Late entry   No notable events documented.   Last Vitals:  Vitals:   10/01/24 0503 10/01/24 1300  BP: 118/68 (!) 120/50  Pulse: 71 79  Resp: 16 18  Temp: 37 C 36.7 C  SpO2: 95% 95%    Last Pain:  Vitals:   10/01/24 1300  TempSrc: Oral  PainSc:                  [name]

## 2024-10-01 NOTE — TOC Progression Note (Signed)
 Transition of Care [facility]) - Progression Note    Patient Details  Name: [name] MRN: [id number] Date of Birth: 1935/12/27  [facility] CM/SW Contact  Hoy DELENA Bigness, LCSW Phone Number: 10/01/2024, 11:13 AM  Clinical Narrative:    Referrals sent out for SNF placement. Awaiting bed offers.      Barriers to Discharge: Continued Medical Work up               Expected Discharge Plan and Services     Post Acute Care Choice: Durable Medical Equipment Living arrangements for the past 2 months: Single Family Home                                       Social Drivers of Health (SDOH) Interventions SDOH Screenings   Food Insecurity: No Food Insecurity (09/28/2024)  Housing: Low Risk  (09/28/2024)  Transportation Needs: No Transportation Needs (09/28/2024)  Utilities: Not At Risk (09/28/2024)  Depression (PHQ2-9): Low Risk  (08/23/2024)  Physical Activity: Insufficiently Active (08/23/2024)  Social Connections: Socially Isolated (09/28/2024)  Stress: Stress Concern Present (08/23/2024)  Tobacco Use: Low Risk  (09/30/2024)    Readmission Risk Interventions    09/30/2024    1:52 PM 09/29/2024    1:57 PM  Readmission Risk Prevention Plan  Medication Screening Complete Complete  Transportation Screening Complete Complete

## 2024-10-01 NOTE — Plan of Care (Signed)
  Problem: Acute Rehab PT Goals(only PT should resolve) Goal: Pt Will Go Supine/Side To Sit Outcome: Progressing Flowsheets (Taken 10/01/2024 1359) Pt will go Supine/Side to Sit: with contact guard assist Goal: Patient Will Perform Sitting Balance Outcome: Progressing Flowsheets (Taken 10/01/2024 1359) Patient will perform sitting balance: with contact guard assist Goal: Patient Will Transfer Sit To/From Stand Outcome: Progressing Flowsheets (Taken 10/01/2024 1359) Patient will transfer sit to/from stand: with minimal assist Goal: Pt Will Perform Standing Balance Or Pre-Gait Outcome: Progressing Flowsheets (Taken 10/01/2024 1359) Pt will perform standing balance or pre-gait: with minimal assist Goal: Pt Will Ambulate Outcome: Progressing Flowsheets (Taken 10/01/2024 1359) Pt will Ambulate:  25 feet  with minimal assist  [name], SPT

## 2024-10-01 NOTE — Care Management Important Message (Signed)
 Important Message  Patient Details  Name: [name] MRN: [id number] Date of Birth: Nov 07, 1936   Important Message Given:  Yes - Medicare IM     Jaila Schellhorn L Jahree Dermody 10/01/2024, 3:48 PM

## 2024-10-01 NOTE — Progress Notes (Signed)
 PROGRESS NOTE    [name]  FMW:[id number] DOB: 1936-09-20 DOA: 09/28/2024 PCP: Mancil Pfeiffer, PA-C   Brief Narrative:  [name] is a 88 y.o. female with medical history significant for hypertension, hypothyroidism, polycythemia vera, anxiety. Patient presented to the ED with reports of a fall.  Patient was holding a cane and trying to open door of her car, after shopping at [facility].  Her knee twisted and she fell onto the floor.   Assessment & Plan:   Principal Problem:   Closed displaced comminuted fracture of shaft of left femur (HCC) Active Problems:   Polycythemia vera (HCC)   Hypothyroid   Primary hypertension   Generalized anxiety disorder with panic attacks   Assessment and Plan:   Closed displaced comminuted fracture of left femur-sustained by mechanical fall.  X-ray shows - acute comminuted distal femoral diaphyseal fracture with mild apex anterior angulation. - surgical fixation of distal femur per orthopedics 10/16. -Labs postop day 1 notable for low sodium 130, hemoglobin trending down 9.9, leukocytosis of 22 noted. -IV morphine 2 mg every 4 hours as needed.  Patient has however not utilized it.  Says pain medications makes her sick. -Will engage PT/OT  - Weight-bear as tolerated, continue to wear knee immobilizer -DVT prophylaxis with aspirin  81 mg twice daily for 6 weeks, start 24 hours postprocedure. Anemia: Likely contributed by blood loss from surgery  Hyponatremia: Sodium 130 postop day 1, will continue to monitor.   Polycythemia vera -Resume hydroxyurea    Hypertension - Resume Cardizem    Hypothyroidism - Resume Synthroid     DVT prophylaxis: SCDS, aspirin  postprocedure Code Status: FULL code Family Communication: none at the bedside Disposition Plan: ~ 2 days Consults called: Orthopedics Admission status: Inpt med surg I certify that at the point of admission it is my clinical judgment that the patient will require inpatient  hospital care spanning beyond 2 midnights from the point of admission due to high intensity of service, high risk for further deterioration and high frequency of surveillance required.     Subjective:  Patient is seen and examined at the bedside.  She does not report of significant pain.  Vital signs are stable.  She is hopeful to get into rehab soon. Objective: Vitals:   09/30/24 1445 09/30/24 1506 09/30/24 2041 10/01/24 0503  BP: (!) 119/57 (!) 132/56 (!) 132/58 118/68  Pulse: 69 64 72 71  Resp: (!) 21 20 17 16   Temp: 98.5 F (36.9 C) 98.6 F (37 C) 99.3 F (37.4 C) 98.6 F (37 C)  TempSrc:  Oral  Oral  SpO2: 98% 96% 92% 95%  Weight:      Height:        Intake/Output Summary (Last 24 hours) at 10/01/2024 0916 Last data filed at 10/01/2024 0545 Gross per 24 hour  Intake 1640 ml  Output 1000 ml  Net 640 ml   Filed Weights   09/28/24 1517 09/28/24 2201 09/30/24 1104  Weight: 47.6 kg 47.9 kg 47.9 kg    Examination:  General: Alert, oriented not in any acute distress Chest: Clear to auscultation bilaterally CVs: S1, S2, no murmur, regular rhythm Abdomen: Soft, nontender Extremities: No edema bilaterally, left lower extremity in knee immobilizer, pulses strong bilateral dorsalis pedis.    Data Reviewed: I have personally reviewed following labs and imaging studies  CBC: Recent Labs  Lab 09/28/24 1640 09/30/24 0924 10/01/24 0430  WBC 11.0*  --  22.4*  NEUTROABS 7.8*  --  20.6*  HGB 11.3* 10.6*  9.9*  HCT 36.4 34.2* 32.0*  MCV 84.5  --  83.6  PLT 196  --  235   Basic Metabolic Panel: Recent Labs  Lab 09/28/24 1640 10/01/24 0430  NA 136 130*  K 4.0 4.3  CL 101 97*  CO2 24 24  GLUCOSE 120* 118*  BUN 17 17  CREATININE 0.65 0.66  CALCIUM 8.6* 8.1*   GFR: Estimated Creatinine Clearance: 38.1 mL/min (by C-G formula based on SCr of 0.66 mg/dL). Liver Function Tests: Recent Labs  Lab 09/28/24 1640  AST 28  ALT 16  ALKPHOS 146*  BILITOT 0.6  PROT 6.6   ALBUMIN 3.8   No results for input(s): LIPASE, AMYLASE in the last 168 hours. No results for input(s): AMMONIA in the last 168 hours. Coagulation Profile: No results for input(s): INR, PROTIME in the last 168 hours. Cardiac Enzymes: No results for input(s): CKTOTAL, CKMB, CKMBINDEX, TROPONINI in the last 168 hours. BNP (last 3 results) No results for input(s): PROBNP in the last 8760 hours. HbA1C: No results for input(s): HGBA1C in the last 72 hours. CBG: No results for input(s): GLUCAP in the last 168 hours. Lipid Profile: No results for input(s): CHOL, HDL, LDLCALC, TRIG, CHOLHDL, LDLDIRECT in the last 72 hours. Thyroid  Function Tests: No results for input(s): TSH, T4TOTAL, FREET4, T3FREE, THYROIDAB in the last 72 hours. Anemia Panel: No results for input(s): VITAMINB12, FOLATE, FERRITIN, TIBC, IRON, RETICCTPCT in the last 72 hours. Sepsis Labs: No results for input(s): PROCALCITON, LATICACIDVEN in the last 168 hours.  No results found for this or any previous visit (from the past 240 hours).       Radiology Studies: DG FEMUR MIN 2 VIEWS LEFT Result Date: 09/30/2024 CLINICAL DATA:  Known left hip fracture EXAM: DG FEMUR 2+V*L* COMPARISON:  09/28/2024 FLUOROSCOPY TIME:  Radiation Exposure Index (as provided by the fluoroscopic device): 10.76 mGy If the device does not provide the exposure index: Fluoroscopy Time:  2 minute 16 seconds Number of Acquired Images:  9 FINDINGS: Initial spot films again demonstrate the comminuted distal femoral metaphyseal fracture. Left shoulder rod was placed with proximal and distal fixation screws. Fracture fragments are in near anatomic alignment. IMPRESSION: ORIF of left distal femoral fracture. Electronically Signed   By: Oneil Devonshire M.D.   On: 09/30/2024 15:20   DG FEMUR MIN 2 VIEWS LEFT Result Date: 09/30/2024 EXAM: 2 VIEW(S) XRAY OF THE LEFT FEMUR 09/30/2024 02:27:00 PM  COMPARISON: 09/28/2024 status post intramedullary rod fixation of comminuted distal left femoral fracture status post left total hip arthroplasty as well. CLINICAL HISTORY: 544450 Closed fracture of left distal femur (HCC) [id number]. S/p left femur IM nail. Closed fracture of left distal femur (HCC) [id number]. S/p left femur IM nail. FINDINGS: BONES AND JOINTS: Status post intramedullary rod fixation of comminuted distal left femoral fracture. Status post left total hip arthroplasty. No acute fracture. No joint dislocation. SOFT TISSUES: The soft tissues are unremarkable. IMPRESSION: 1. Status post intramedullary rod fixation of a comminuted distal left femoral fracture. Electronically signed by: Lynwood Seip MD 09/30/2024 02:50 PM EDT RP Workstation: [id number]   DG C-Arm 1-60 Min-No Report Result Date: 09/30/2024 Fluoroscopy was utilized by the requesting physician.  No radiographic interpretation.        Scheduled Meds:  diltiazem   180 mg Oral Daily   hydroxyurea   500 mg Oral Once per day on Monday Tuesday Wednesday Thursday Friday   levothyroxine   50 mcg Oral QAC breakfast   Continuous Infusions:   ceFAZolin  (ANCEF ) IV 2  g (10/01/24 9390)          Derryl Duval, MD [facility] 10/01/2024, 9:16 AM

## 2024-10-01 NOTE — Evaluation (Signed)
 Physical Therapy Evaluation Patient Details Name: [name] MRN: [id number] DOB: Oct 21, 1936 Today's Date: 10/01/2024  History of Present Illness  [name] is a 88 y.o. female with medical history significant for hypertension, hypothyroidism, polycythemia vera, anxiety.  Patient presented to the ED with reports of a fall.  Patient was holding a cane and trying to open door of her car, after shopping at [facility].  Her knee twisted and she fell onto the floor.  She denies dizziness, no chest pain or difficulty breathing.  She has maintained good oral intake.  No GI losses.    She has had prior falls, sustaining fractures to her left arm - 12/2022 requiring surgery, has also had left hip arthroplasty in 2019. Status post Retrograde intramedullary nail for a comminuted left distal femoral shaft fracture.   Clinical Impression  Pt. Presented post-op with R. Knee immobilizer with generalized weakness and limited mobility in the RLE. Pt. Needed min A  for bed mobility and assistance with lifting the RLE to get to EOB. Pt needed Mod A for transfer and ambulation with RW, pt performed these activities on  Room air oxygen. Nurse was notifies on pt. Status. Patient will benefit from continued skilled physical therapy in hospital and recommended venue below to increase strength, balance, endurance for safe ADLs and gait.       If plan is discharge home, recommend the following: A lot of help with walking and/or transfers;A little help with bathing/dressing/bathroom;Assistance with cooking/housework   Can travel by private vehicle   Yes    Equipment Recommendations Rolling walker (2 wheels)  Recommendations for Other Services       Functional Status Assessment Patient has had a recent decline in their functional status and demonstrates the ability to make significant improvements in function in a reasonable and predictable amount of time.     Precautions / Restrictions  Precautions Precautions: Fall Recall of Precautions/Restrictions: Intact Required Braces or Orthoses: Knee Immobilizer - Left Knee Immobilizer - Left: On at all times Restrictions Weight Bearing Restrictions Per Provider Order: Yes LLE Weight Bearing Per Provider Order: Weight bearing as tolerated      Mobility  Bed Mobility Overal bed mobility: Needs Assistance Bed Mobility: Supine to Sit     Supine to sit: Mod assist, Min assist     General bed mobility comments: labored movement; assist to move R LE to EOB.    Transfers Overall transfer level: Needs assistance Equipment used: Rolling walker (2 wheels) Transfers: Sit to/from Stand, Bed to chair/wheelchair/BSC Sit to Stand: Mod assist   Step pivot transfers: Mod assist       General transfer comment: Extended time and labored effort with RW.    Ambulation/Gait Ambulation/Gait assistance: Mod assist Gait Distance (Feet): 12 Feet Assistive device: Rolling walker (2 wheels) Gait Pattern/deviations: Decreased step length - right, Decreased step length - left, Decreased stride length, Decreased dorsiflexion - right, Narrow base of support, Antalgic       General Gait Details: during amulation it was labored, from bed to chair and RLE had limited flexion due to it being the surgical leg.  Stairs            Wheelchair Mobility     Tilt Bed    Modified Rankin (Stroke Patients Only)       Balance Overall balance assessment: Needs assistance Sitting-balance support: No upper extremity supported, Feet supported Sitting balance-Leahy Scale: Fair Sitting balance - Comments: fair to good seated at EOB  Standing balance support: Bilateral upper extremity supported, During functional activity, Reliant on assistive device for balance Standing balance-Leahy Scale: Poor Standing balance comment: using RW, and swaying                             Pertinent Vitals/Pain Pain Assessment Pain  Assessment: No/denies pain    Home Living Family/patient expects to be discharged to:: Private residence Living Arrangements: Alone Available Help at Discharge: Family;Available 24 hours/day Type of Home: House Home Access: Stairs to enter Entrance Stairs-Rails: None;Right;Left Entrance Stairs-Number of Steps: 3 steps in front without side rails, 7 steps in back with bilateral side rails - to wide to reach both   Home Layout: One level Home Equipment: Agricultural consultant (2 wheels);Cane - single point;BSC/3in1;Shower seat;Cane - quad;Grab bars - tub/shower;Adaptive equipment Additional Comments: Pt reports living in the same home as prior admission.Pt. wears glasses during daily activies.    Prior Function Prior Level of Function : Independent/Modified Independent;Driving             Mobility Comments: SPC used in home; quad cane used in community. ADLs Comments: Independent     Extremity/Trunk Assessment   Upper Extremity Assessment Upper Extremity Assessment: Generalized weakness    Lower Extremity Assessment Lower Extremity Assessment: Generalized weakness;LLE deficits/detail LLE Deficits / Details: Pt. surgical leg. LLE Sensation: WNL LLE Coordination: decreased gross motor;WNL    Cervical / Trunk Assessment Cervical / Trunk Assessment: Kyphotic  Communication   Communication Communication: No apparent difficulties    Cognition Arousal: Alert Behavior During Therapy: WFL for tasks assessed/performed   PT - Cognitive impairments: No apparent impairments                         Following commands: Intact       Cueing Cueing Techniques: Verbal cues, Tactile cues     General Comments      Exercises     Assessment/Plan    PT Assessment Patient needs continued PT services  PT Problem List Decreased strength;Decreased range of motion;Decreased activity tolerance;Decreased balance;Decreased mobility       PT Treatment Interventions DME  instruction;Gait training;Stair training;Functional mobility training;Therapeutic activities;Therapeutic exercise;Balance training;Patient/family education    PT Goals (Current goals can be found in the Care Plan section)  Acute Rehab PT Goals Patient Stated Goal: pt. wants to go to SNF and then return back home with friends/family. PT Goal Formulation: With patient Time For Goal Achievement: 10/07/24 Potential to Achieve Goals: Good    Frequency Min 3X/week     Co-evaluation PT/OT/SLP Co-Evaluation/Treatment: Yes Reason for Co-Treatment: To address functional/ADL transfers PT goals addressed during session: Mobility/safety with mobility OT goals addressed during session: ADL's and self-care       AM-PAC PT 6 Clicks Mobility  Outcome Measure Help needed turning from your back to your side while in a flat bed without using bedrails?: A Little Help needed moving from lying on your back to sitting on the side of a flat bed without using bedrails?: A Little Help needed moving to and from a bed to a chair (including a wheelchair)?: A Lot Help needed standing up from a chair using your arms (e.g., wheelchair or bedside chair)?: A Lot Help needed to walk in hospital room?: A Lot Help needed climbing 3-5 steps with a railing? : A Lot 6 Click Score: 14    End of Session Equipment Utilized During Treatment: Gait belt;Right knee  immobilizer Activity Tolerance: Patient tolerated treatment well;Other (comment) (patient had limited mobility due to it being the surgical leg.) Patient left: in chair;with call bell/phone within reach Nurse Communication: Mobility status PT Visit Diagnosis: Unsteadiness on feet (R26.81);History of falling (Z91.81);Muscle weakness (generalized) (M62.81)    Time: 0920-0950 PT Time Calculation (min) (ACUTE ONLY): 30 min   Charges:   PT Evaluation $PT Eval Moderate Complexity: 1 Mod PT Treatments $Therapeutic Activity: 23-37 mins PT General Charges $$  ACUTE PT VISIT: 1 Visit         Ivery Cable, SPT   Claudell Rhody 10/01/2024, 1:50 PM

## 2024-10-01 NOTE — Plan of Care (Signed)
  Problem: Acute Rehab OT Goals (only OT should resolve) Goal: Pt. Will Perform Grooming Flowsheets (Taken 10/01/2024 1109) Pt Will Perform Grooming:  with modified independence  standing Goal: Pt. Will Perform Lower Body Bathing Flowsheets (Taken 10/01/2024 1109) Pt Will Perform Lower Body Bathing:  with modified independence  with adaptive equipment  sitting/lateral leans Goal: Pt. Will Perform Lower Body Dressing Flowsheets (Taken 10/01/2024 1109) Pt Will Perform Lower Body Dressing:  with modified independence  with adaptive equipment  sitting/lateral leans Goal: Pt. Will Transfer To Toilet Flowsheets (Taken 10/01/2024 1109) Pt Will Transfer to Toilet:  ambulating  with supervision  with contact guard assist Goal: Pt. Will Perform Toileting-Clothing Manipulation Flowsheets (Taken 10/01/2024 1109) Pt Will Perform Toileting - Clothing Manipulation and hygiene:  with modified independence  sitting/lateral leans  with adaptive equipment Goal: Pt/Caregiver Will Perform Home Exercise Program Flowsheets (Taken 10/01/2024 1109) Pt/caregiver will Perform Home Exercise Program:  Increased strength  Both right and left upper extremity  Independently  Lynnsey Barbara OT, MOT

## 2024-10-01 NOTE — Plan of Care (Signed)

## 2024-10-02 DIAGNOSIS — I4892 Unspecified atrial flutter: Secondary | ICD-10-CM | POA: Diagnosis not present

## 2024-10-02 DIAGNOSIS — S72352A Displaced comminuted fracture of shaft of left femur, initial encounter for closed fracture: Secondary | ICD-10-CM | POA: Diagnosis not present

## 2024-10-02 LAB — BASIC METABOLIC PANEL WITH GFR
Anion gap: 8 (ref 5–15)
BUN: 15 mg/dL (ref 8–23)
CO2: 25 mmol/L (ref 22–32)
Calcium: 7.7 mg/dL — ABNORMAL LOW (ref 8.9–10.3)
Chloride: 102 mmol/L (ref 98–111)
Creatinine, Ser: 0.48 mg/dL (ref 0.44–1.00)
GFR, Estimated: >60 mL/min (ref >60)
Glucose, Bld: 109 mg/dL — ABNORMAL HIGH (ref 70–99)
Potassium: 3.6 mmol/L (ref 3.5–5.1)
Sodium: 135 mmol/L (ref 135–145)

## 2024-10-02 LAB — MAGNESIUM: Magnesium: 2.1 mg/dL (ref 1.7–2.4)

## 2024-10-02 LAB — HEMOGLOBIN AND HEMATOCRIT, BLOOD
HCT: 32.9 % — ABNORMAL LOW (ref 36.0–46.0)
Hemoglobin: 10.4 g/dL — ABNORMAL LOW (ref 12.0–15.0)

## 2024-10-02 LAB — HEPARIN LEVEL (UNFRACTIONATED): Heparin Unfractionated: <0.1 [IU]/mL — ABNORMAL LOW (ref 0.30–0.70)

## 2024-10-02 LAB — MRSA NEXT GEN BY PCR, NASAL: MRSA by PCR Next Gen: NOT DETECTED

## 2024-10-02 MED ORDER — DILTIAZEM HCL 25 MG/5ML IV SOLN
10.0000 mg | Freq: Once | INTRAVENOUS | Status: AC
  Administered 2024-10-02: 10 mg via INTRAVENOUS
  Filled 2024-10-02: qty 5

## 2024-10-02 MED ORDER — CHLORHEXIDINE GLUCONATE CLOTH 2 % EX PADS
6.0000 | MEDICATED_PAD | Freq: Every day | CUTANEOUS | Status: DC
  Administered 2024-10-02 – 2024-10-05 (×4): 6 via TOPICAL

## 2024-10-02 MED ORDER — POLYETHYLENE GLYCOL 3350 17 G PO PACK
17.0000 g | PACK | Freq: Every day | ORAL | Status: DC
Start: 2024-10-02 — End: 2024-10-05
  Administered 2024-10-02 – 2024-10-05 (×4): 17 g via ORAL
  Filled 2024-10-02 (×4): qty 1

## 2024-10-02 MED ORDER — DILTIAZEM HCL ER COATED BEADS 240 MG PO CP24
240.0000 mg | ORAL_CAPSULE | Freq: Every day | ORAL | Status: DC
  Administered 2024-10-03 – 2024-10-05 (×3): 240 mg via ORAL
  Filled 2024-10-02 (×3): qty 1

## 2024-10-02 MED ORDER — DILTIAZEM HCL-DEXTROSE 125-5 MG/125ML-% IV SOLN (PREMIX)
5.0000 mg/h | INTRAVENOUS | Status: DC
  Administered 2024-10-03 (×2): 15 mg/h via INTRAVENOUS
  Filled 2024-10-02 (×2): qty 125

## 2024-10-02 MED ORDER — SENNOSIDES-DOCUSATE SODIUM 8.6-50 MG PO TABS
1.0000 | ORAL_TABLET | Freq: Every day | ORAL | Status: DC
  Administered 2024-10-02 – 2024-10-04 (×3): 1 via ORAL
  Filled 2024-10-02 (×3): qty 1

## 2024-10-02 MED ORDER — HEPARIN (PORCINE) 25000 UT/250ML-% IV SOLN
850.0000 [IU]/h | INTRAVENOUS | Status: DC
  Administered 2024-10-02: 700 [IU]/h via INTRAVENOUS
  Filled 2024-10-02: qty 250

## 2024-10-02 MED ORDER — DILTIAZEM HCL-DEXTROSE 125-5 MG/125ML-% IV SOLN (PREMIX)
INTRAVENOUS | Status: AC
  Administered 2024-10-02: 10 mg/h via INTRAVENOUS
  Filled 2024-10-02: qty 125

## 2024-10-02 MED ORDER — MELATONIN 3 MG PO TABS
3.0000 mg | ORAL_TABLET | Freq: Every day | ORAL | Status: DC
  Administered 2024-10-02 – 2024-10-03 (×2): 3 mg via ORAL
  Filled 2024-10-02 (×2): qty 1

## 2024-10-02 NOTE — Progress Notes (Signed)
 Patient transferred to ICU hr sustained in 180s drip raised to 15mg /hr report given to primary RN. Patient has new complaint of left knee pain .

## 2024-10-02 NOTE — Progress Notes (Signed)
 PHARMACY - ANTICOAGULATION CONSULT NOTE  Pharmacy Consult for heparin Indication: atrial fibrillation  Allergies  Allergen Reactions   Codeine Nausea And Vomiting   Diovan [Valsartan] Itching and Rash   Lamisil [Terbinafine] Swelling   Penicillins Rash and Other (See Comments)    Breaking out in whelps     Patient Measurements: Height: 5' 1 (154.9 cm) Weight: 47.9 kg (105 lb 9.6 oz) IBW/kg (Calculated) : 47.8 HEPARIN DW (KG): 47.9  Vital Signs: Temp: 98 F (36.7 C) (10/18 1400) Temp Source: Oral (10/18 1400) BP: 121/63 (10/18 1400) Pulse Rate: 74 (10/18 1400)  Labs: Recent Labs    09/30/24 0924 10/01/24 0430 10/02/24 0419  HGB 10.6* 9.9* 10.4*  HCT 34.2* 32.0* 32.9*  PLT  --  235  --   CREATININE  --  0.66 0.48    Estimated Creatinine Clearance: 38.1 mL/min (by C-G formula based on SCr of 0.48 mg/dL).   Medical History: Past Medical History:  Diagnosis Date   Hypertension    Influenza 11/2012   Osteoporosis    Polycythemia    Polycythemia vera(238.4) 02/25/2013   Diagnosed August 2005 on Hydrea  500 mg 3 times a week with excellent control.    Prolapse, uterus, congenital    ring placement    Medications:  Medications Prior to Admission  Medication Sig Dispense Refill Last Dose/Taking   aspirin  81 MG tablet Take 81 mg by mouth every evening.   09/27/2024 at  6:00 PM   cetirizine (ZYRTEC) 10 MG tablet Take 10 mg by mouth daily as needed for allergies.   Past Week   diltiazem  (CARDIZEM  CD) 180 MG 24 hr capsule Take 1 capsule (180 mg total) by mouth daily. 90 capsule 1 09/28/2024 at  1:15 PM   hydroxyurea  (HYDREA ) 500 MG capsule May take with food to minimize GI side effects on Monday - Friday only (Patient taking differently: Take 500 mg by mouth daily. May take with food to minimize GI side effects on Monday - Friday only) 60 capsule 1 09/28/2024 Noon   hydrOXYzine  (VISTARIL ) 25 MG capsule Take 1 capsule (25 mg total) by mouth every 8 (eight) hours as  needed. (Patient taking differently: Take 25 mg by mouth every 8 (eight) hours as needed for anxiety or itching.) 30 capsule 0 Unknown   levothyroxine  (SYNTHROID ) 50 MCG tablet Take 1 tablet (50 mcg total) by mouth daily before breakfast. 90 tablet 1 09/28/2024 Morning   mometasone (NASONEX) 50 MCG/ACT nasal spray Place 2 sprays into the nose daily as needed (congestion).   Past Month   Vitamin D , Ergocalciferol , (DRISDOL ) 1.25 MG (50000 UNIT) CAPS capsule Take 1 capsule (50,000 Units total) by mouth every 14 (fourteen) days. (Patient taking differently: Take 50,000 Units by mouth every 30 (thirty) days.) 6 capsule 3 09/07/2024    Assessment: Pharmacy consulted to dose heparin in patient with atrial fibrillation.  She is not on anticoagulation prior to admission.  Hgb 10.4  Goal of Therapy:  Heparin level 0.3-0.7 units/ml Monitor platelets by anticoagulation protocol: Yes   Plan:  No bolus per MD Start heparin infusion at 700 units/hr Check anti-Xa level in 8 hours and daily while on heparin Continue to monitor H&H and platelets  Elspeth Sour, PharmD Clinical Pharmacist 10/02/2024 2:31 PM

## 2024-10-02 NOTE — Plan of Care (Signed)
   Problem: Education: Goal: Knowledge of General Education information will improve Description: Including pain rating scale, medication(s)/side effects and non-pharmacologic comfort measures Outcome: Progressing   Problem: Clinical Measurements: Goal: Ability to maintain clinical measurements within normal limits will improve Outcome: Progressing Goal: Diagnostic test results will improve Outcome: Progressing

## 2024-10-02 NOTE — Progress Notes (Signed)
  EKG reveals a flutter with RVR. Discussed with patient's daughter over the phone.  She says patient had a moment of atrial flutter after her hip surgery 6 years ago.  She saw a cardiologist in.  She does not appear to be on any anticoagulants.  She has been receiving Cardizem  180 mg daily.  Patient is asymptomatic with rapid A-flutter.  Daughter says occasionally her heart rate goes up at home.  I suspect she has been having intermittent A-flutter RVR. Discussed further treatment including rate control, risk and benefit of anticoagulation. Will obtain echocardiogram. Will give IV Cardizem  10 mg.  If not converting, will start on IV Cardizem  drip.  Will increase oral Cardizem  to 240 mg daily. Cardiac monitoring. Start IV heparin

## 2024-10-02 NOTE — Progress Notes (Signed)
 PROGRESS NOTE    [name]  FMW:[id number] DOB: 03-19-1936 DOA: 09/28/2024 PCP: Mancil Pfeiffer, PA-C   Brief Narrative:  [name] is a 88 y.o. female with medical history significant for hypertension, hypothyroidism, polycythemia vera, anxiety. Patient presented to the ED with reports of a fall.  Patient was holding a cane and trying to open door of her car, after shopping at [facility].  Her knee twisted and she fell onto the floor.   Assessment & Plan:   Principal Problem:   Closed displaced comminuted fracture of shaft of left femur (HCC) Active Problems:   Polycythemia vera (HCC)   Hypothyroid   Primary hypertension   Generalized anxiety disorder with panic attacks   Assessment and Plan:   Closed displaced comminuted fracture of left femur-sustained by mechanical fall.  X-ray shows - acute comminuted distal femoral diaphyseal fracture with mild apex anterior angulation. - surgical fixation of distal femur per orthopedics 10/16. -IV morphine 2 mg every 4 hours as needed.  Patient has however not utilized it.  Says pain medications makes her sick. -Will engage PT/OT  - Weight-bear as tolerated, continue to wear knee immobilizer -DVT prophylaxis with aspirin  81 mg twice daily for 6 weeks,  Tachycardia: Regular but tachycardic on exam, will obtain EKG.   Anemia: Likely contributed by blood loss from surgery.  Hemoglobin is stable at 10.  Hyponatremia: Sodium 130 postop day 1 has normalized.  Polycythemia vera -Resume hydroxyurea    Hypertension - Resume Cardizem    Hypothyroidism - Resume Synthroid   Constipation.  Will schedule MiraLAX and Senokot.    DVT prophylaxis: SCDS, aspirin  postprocedure Code Status: FULL code Family Communication: Daughter at the bedside  disposition Plan: ~ 2 days Consults called: Orthopedics Admission status: Inpt med surg I certify that at the point of admission it is my clinical judgment that the patient will require  inpatient hospital care spanning beyond 2 midnights from the point of admission due to high intensity of service, high risk for further deterioration and high frequency of surveillance required.     Subjective:  Patient is seen and examined at the bedside.  Postop day #2.  Patient denies significant pain.  Reports constipation.  Vital signs stable, tachycardic on exam.    Objective: Vitals:   10/01/24 0503 10/01/24 1300 10/01/24 2002 10/02/24 0425  BP: 118/68 (!) 120/50 (!) 151/65 124/79  Pulse: 71 79 (!) 43 72  Resp: 16 18 17 16   Temp: 98.6 F (37 C) 98 F (36.7 C) 98.4 F (36.9 C) 97.9 F (36.6 C)  TempSrc: Oral Oral    SpO2: 95% 95% 92% 95%  Weight:      Height:        Intake/Output Summary (Last 24 hours) at 10/02/2024 1255 Last data filed at 10/02/2024 0900 Gross per 24 hour  Intake 240 ml  Output 800 ml  Net -560 ml   Filed Weights   09/28/24 1517 09/28/24 2201 09/30/24 1104  Weight: 47.6 kg 47.9 kg 47.9 kg    Examination:  General: Alert, oriented not in any acute distress Chest: Clear to auscultation bilaterally CVs: S1, S2, no murmur, regular rhythm Abdomen: Soft, nontender Extremities: No edema bilaterally, left lower extremity in knee immobilizer, pulses strong bilateral dorsalis pedis.    Data Reviewed: I have personally reviewed following labs and imaging studies  CBC: Recent Labs  Lab 09/28/24 1640 09/30/24 0924 10/01/24 0430 10/02/24 0419  WBC 11.0*  --  22.4*  --   NEUTROABS 7.8*  --  20.6*  --   HGB 11.3* 10.6* 9.9* 10.4*  HCT 36.4 34.2* 32.0* 32.9*  MCV 84.5  --  83.6  --   PLT 196  --  235  --    Basic Metabolic Panel: Recent Labs  Lab 09/28/24 1640 10/01/24 0430 10/02/24 0419  NA 136 130* 135  K 4.0 4.3 3.6  CL 101 97* 102  CO2 24 24 25   GLUCOSE 120* 118* 109*  BUN 17 17 15   CREATININE 0.65 0.66 0.48  CALCIUM 8.6* 8.1* 7.7*   GFR: Estimated Creatinine Clearance: 38.1 mL/min (by C-G formula based on SCr of 0.48  mg/dL). Liver Function Tests: Recent Labs  Lab 09/28/24 1640  AST 28  ALT 16  ALKPHOS 146*  BILITOT 0.6  PROT 6.6  ALBUMIN 3.8   No results for input(s): LIPASE, AMYLASE in the last 168 hours. No results for input(s): AMMONIA in the last 168 hours. Coagulation Profile: No results for input(s): INR, PROTIME in the last 168 hours. Cardiac Enzymes: No results for input(s): CKTOTAL, CKMB, CKMBINDEX, TROPONINI in the last 168 hours. BNP (last 3 results) No results for input(s): PROBNP in the last 8760 hours. HbA1C: No results for input(s): HGBA1C in the last 72 hours. CBG: No results for input(s): GLUCAP in the last 168 hours. Lipid Profile: No results for input(s): CHOL, HDL, LDLCALC, TRIG, CHOLHDL, LDLDIRECT in the last 72 hours. Thyroid  Function Tests: No results for input(s): TSH, T4TOTAL, FREET4, T3FREE, THYROIDAB in the last 72 hours. Anemia Panel: No results for input(s): VITAMINB12, FOLATE, FERRITIN, TIBC, IRON, RETICCTPCT in the last 72 hours. Sepsis Labs: No results for input(s): PROCALCITON, LATICACIDVEN in the last 168 hours.  No results found for this or any previous visit (from the past 240 hours).       Radiology Studies: DG FEMUR MIN 2 VIEWS LEFT Result Date: 09/30/2024 CLINICAL DATA:  Known left hip fracture EXAM: DG FEMUR 2+V*L* COMPARISON:  09/28/2024 FLUOROSCOPY TIME:  Radiation Exposure Index (as provided by the fluoroscopic device): 10.76 mGy If the device does not provide the exposure index: Fluoroscopy Time:  2 minute 16 seconds Number of Acquired Images:  9 FINDINGS: Initial spot films again demonstrate the comminuted distal femoral metaphyseal fracture. Left shoulder rod was placed with proximal and distal fixation screws. Fracture fragments are in near anatomic alignment. IMPRESSION: ORIF of left distal femoral fracture. Electronically Signed   By: Oneil Devonshire M.D.   On: 09/30/2024 15:20    DG FEMUR MIN 2 VIEWS LEFT Result Date: 09/30/2024 EXAM: 2 VIEW(S) XRAY OF THE LEFT FEMUR 09/30/2024 02:27:00 PM COMPARISON: 09/28/2024 status post intramedullary rod fixation of comminuted distal left femoral fracture status post left total hip arthroplasty as well. CLINICAL HISTORY: 544450 Closed fracture of left distal femur (HCC) [id number]. S/p left femur IM nail. Closed fracture of left distal femur (HCC) [id number]. S/p left femur IM nail. FINDINGS: BONES AND JOINTS: Status post intramedullary rod fixation of comminuted distal left femoral fracture. Status post left total hip arthroplasty. No acute fracture. No joint dislocation. SOFT TISSUES: The soft tissues are unremarkable. IMPRESSION: 1. Status post intramedullary rod fixation of a comminuted distal left femoral fracture. Electronically signed by: Lynwood Seip MD 09/30/2024 02:50 PM EDT RP Workstation: [id number]   DG C-Arm 1-60 Min-No Report Result Date: 09/30/2024 Fluoroscopy was utilized by the requesting physician.  No radiographic interpretation.        Scheduled Meds:  aspirin  EC  81 mg Oral BID   diltiazem   180 mg Oral Daily  hydroxyurea   500 mg Oral Once per day on Monday Tuesday Wednesday Thursday Friday   levothyroxine   50 mcg Oral QAC breakfast   polyethylene glycol  17 g Oral Daily   senna-docusate  1 tablet Oral QHS   Continuous Infusions:          Derryl Duval, MD [facility] 10/02/2024, 12:55 PM

## 2024-10-02 NOTE — Progress Notes (Signed)
 Called by Tele informing patient hr now in 140's . Upon entering room patient sitting up eating dinner c/o right knee pain s/p distal femur fracture surgery 10/10 Patient hr remains in the 150-160 range AOC and CN notified as well as MD. Orders to start Cardizem  drip and move to ICU asap. In order to best care for patient in decline Cardizem  drip begun in unit as per MD orders as we await room to become open in ICU. Will remain at bedside with patient until transfer occurs.

## 2024-10-02 NOTE — Progress Notes (Signed)
 Patient being placed back into bed from sitting in chair found to have rapid hr while completing afternoon vital signs. Patient asymptomatic. Provider notifed EKG obtained showing sustained afib rvr at 147. New orders received for diltiazem  10mg  ivp which was given. Patient placed on bedside cardiac monitor and both unit charge nurse and house supervisor notified immediately. Patient shortly converted back with hr below 110. New order received for heparin drip which was started and MD notified patient stable in high 90's to low 100's. Patient to currently remain on unit and be closely monitored. If HR goes back into 120's or above patient to be placed on cardizem  drip and moved to ICU per provider all supervisors aware.

## 2024-10-02 NOTE — Plan of Care (Signed)

## 2024-10-02 NOTE — Progress Notes (Addendum)
 1828: Made Dr Mcarthur aware that patient requests low dose melatonin to help sleep tonight. Does not want anything strong. Also said her knee was hurting prior to transport but only for a few minutes. Pain is 0/10 now and patient does not want pain meds.

## 2024-10-02 NOTE — TOC Progression Note (Signed)
 Transition of Care [facility]) - Progression Note    Patient Details  Name: [name] MRN: [id number] Date of Birth: 07-Jan-1936  [facility] CM/SW Contact  Lucie Lunger, [place] Phone Number: 10/02/2024, 11:00 AM  Clinical Narrative:    CSW notes there are no SNF bed offers at this time, [facility] and [facility] are pending at this time. Pts SNF insurance shara is also pending at this time. TOC to follow.    Barriers to Discharge: Continued Medical Work up               Expected Discharge Plan and Services     Post Acute Care Choice: Durable Medical Equipment Living arrangements for the past 2 months: Single Family Home                                       Social Drivers of Health (SDOH) Interventions SDOH Screenings   Food Insecurity: No Food Insecurity (09/28/2024)  Housing: Low Risk  (09/28/2024)  Transportation Needs: No Transportation Needs (09/28/2024)  Utilities: Not At Risk (09/28/2024)  Depression (PHQ2-9): Low Risk  (08/23/2024)  Physical Activity: Insufficiently Active (08/23/2024)  Social Connections: Socially Isolated (09/28/2024)  Stress: Stress Concern Present (08/23/2024)  Tobacco Use: Low Risk  (09/30/2024)    Readmission Risk Interventions    09/30/2024    1:52 PM 09/29/2024    1:57 PM  Readmission Risk Prevention Plan  Medication Screening Complete Complete  Transportation Screening Complete Complete

## 2024-10-03 ENCOUNTER — Inpatient Hospital Stay (HOSPITAL_COMMUNITY)

## 2024-10-03 DIAGNOSIS — S72352D Displaced comminuted fracture of shaft of left femur, subsequent encounter for closed fracture with routine healing: Secondary | ICD-10-CM

## 2024-10-03 DIAGNOSIS — I480 Paroxysmal atrial fibrillation: Secondary | ICD-10-CM

## 2024-10-03 DIAGNOSIS — D45 Polycythemia vera: Secondary | ICD-10-CM | POA: Diagnosis not present

## 2024-10-03 DIAGNOSIS — I4892 Unspecified atrial flutter: Secondary | ICD-10-CM

## 2024-10-03 DIAGNOSIS — E039 Hypothyroidism, unspecified: Secondary | ICD-10-CM | POA: Diagnosis not present

## 2024-10-03 DIAGNOSIS — I10 Essential (primary) hypertension: Secondary | ICD-10-CM | POA: Diagnosis not present

## 2024-10-03 LAB — CBC
HCT: 30.9 % — ABNORMAL LOW (ref 36.0–46.0)
Hemoglobin: 9.6 g/dL — ABNORMAL LOW (ref 12.0–15.0)
MCH: 25.9 pg — ABNORMAL LOW (ref 26.0–34.0)
MCHC: 31.1 g/dL (ref 30.0–36.0)
MCV: 83.5 fL (ref 80.0–100.0)
Platelets: 243 K/uL (ref 150–400)
RBC: 3.7 MIL/uL — ABNORMAL LOW (ref 3.87–5.11)
RDW: 21.6 % — ABNORMAL HIGH (ref 11.5–15.5)
WBC: 22.7 K/uL — ABNORMAL HIGH (ref 4.0–10.5)
nRBC: 0.1 % (ref 0.0–0.2)

## 2024-10-03 LAB — ECHOCARDIOGRAM COMPLETE
AR max vel: 3.36 cm2
AV Area VTI: 3.34 cm2
AV Area mean vel: 3.6 cm2
AV Mean grad: 2.5 mmHg
AV Peak grad: 6.1 mmHg
Ao pk vel: 1.23 m/s
Area-P 1/2: 3.99 cm2
Calc EF: 62.7 %
Height: 61 in
S' Lateral: 2.5 cm
Single Plane A2C EF: 63.9 %
Single Plane A4C EF: 60.6 %
Weight: 1738.99 [oz_av]

## 2024-10-03 LAB — BASIC METABOLIC PANEL WITH GFR
Anion gap: 8 (ref 5–15)
BUN: 16 mg/dL (ref 8–23)
CO2: 25 mmol/L (ref 22–32)
Calcium: 7.4 mg/dL — ABNORMAL LOW (ref 8.9–10.3)
Chloride: 101 mmol/L (ref 98–111)
Creatinine, Ser: 0.48 mg/dL (ref 0.44–1.00)
GFR, Estimated: >60 mL/min (ref >60)
Glucose, Bld: 111 mg/dL — ABNORMAL HIGH (ref 70–99)
Potassium: 3.8 mmol/L (ref 3.5–5.1)
Sodium: 133 mmol/L — ABNORMAL LOW (ref 135–145)

## 2024-10-03 LAB — HEPARIN LEVEL (UNFRACTIONATED): Heparin Unfractionated: <0.1 [IU]/mL — ABNORMAL LOW (ref 0.30–0.70)

## 2024-10-03 MED ORDER — METOPROLOL TARTRATE 25 MG PO TABS
25.0000 mg | ORAL_TABLET | Freq: Two times a day (BID) | ORAL | Status: DC
  Administered 2024-10-03 – 2024-10-05 (×5): 25 mg via ORAL
  Filled 2024-10-03 (×5): qty 1

## 2024-10-03 MED ORDER — APIXABAN 2.5 MG PO TABS
2.5000 mg | ORAL_TABLET | Freq: Two times a day (BID) | ORAL | Status: DC
  Administered 2024-10-03 – 2024-10-05 (×5): 2.5 mg via ORAL
  Filled 2024-10-03 (×5): qty 1

## 2024-10-03 NOTE — Discharge Instructions (Signed)

## 2024-10-03 NOTE — Progress Notes (Signed)
 PROGRESS NOTE    DANEA MANTER  FMW:[id number] DOB: Dec 13, 1936 DOA: 09/28/2024 PCP: Mancil Pfeiffer, PA-C   Brief Narrative:  [name] is a 88 y.o. female with medical history significant for hypertension, hypothyroidism, polycythemia vera, anxiety. Patient presented to the ED with reports of a fall.  Patient was holding a cane and trying to open door of her car, after shopping at [facility]  Her knee twisted and she fell onto the floor.   Assessment & Plan:   Principal Problem:   Closed displaced comminuted fracture of shaft of left femur (HCC) Active Problems:   Polycythemia vera (HCC)   Hypothyroid   Primary hypertension   Generalized anxiety disorder with panic attacks   Atrial flutter (HCC)   Assessment and Plan:   Closed displaced comminuted fracture of left femur-sustained by mechanical fall.  X-ray shows - acute comminuted distal femoral diaphyseal fracture with mild apex anterior angulation. - surgical fixation of distal femur per orthopedics 10/16. -Continue as needed analgesics - DVT prophylaxis will be covered by the use of Eliquis - Per orthopedic recommendations weightbearing as tolerated continue to wear knee immobilizer. -PT/OT has recommended a skilled nursing facility at discharge for rehab and conditioning.  Atrial flutter/atrial fibrillation with RVR  - Per family report patient has had prior history of atrial fibrillation around hip surgery in the past - Most likely paroxysmal atrial flutter/A-fib - Excellent response to the use of Cardizem  drip - Now transition to adjusted dose of Cardizem  (240 mg extended release) and low-dose metoprolol - Given CHA2DS2-VASc score >3 patient has been started on Eliquis. - 2D echo: Demonstrating preserved ejection fraction (60-65%), no wall motion abnormalities and not significant valvular disorder.  Transient hyponatremia:  - Stable and within normal limits: - Continue to follow trend.  Polycythemia  vera - Continue hydroxyurea  -Follow hemoglobin stability.   Hypertension - Continue adjusted dose of Cardizem  and now metoprolol - Follow vital signs for stability.    Hypothyroidism - Continue Synthroid   Constipation.   - Continue to maintain adequate hydration - Will schedule MiraLAX and Senokot.  Insomnia - Continue the use of as needed melatonin.    DVT prophylaxis: Eliquis. Code Status: FULL code Family Communication: No family at bedside. disposition Plan: Skilled nursing facility in the next 1 ~ 2 days Consults called: Orthopedics Admission status: Inpt telemetry bed.   Subjective: No chest pain, no fever, no nausea or vomiting.  Patient reports no shortness of breath and denies palpitation.  Excellent response to Cardizem  drip.  Objective: Vitals:   10/03/24 1000 10/03/24 1049 10/03/24 1101 10/03/24 1102  BP: (!) 128/41 (!) 114/43 132/79   Pulse: 62 77  77  Resp: (!) 24 20  (!) 22  Temp:      TempSrc:      SpO2: 96% 92%  93%  Weight:      Height:        Intake/Output Summary (Last 24 hours) at 10/03/2024 1207 Last data filed at 10/03/2024 0947 Gross per 24 hour  Intake 1340 ml  Output 560 ml  Net 780 ml   Filed Weights   09/28/24 2201 09/30/24 1104 10/02/24 1813  Weight: 47.9 kg 47.9 kg 49.3 kg    Examination: General exam: Alert, awake, oriented x 3; denying chest pain, shortness of breath and palpitation. Respiratory system: Good saturation on room air.  No using accessory muscle. Cardiovascular system: Rate controlled, irregular bleeding appreciated on telemetry.  No rubs, no gallops, no JVD Gastrointestinal system: Abdomen  is nondistended, soft and nontender. No organomegaly or masses felt. Normal bowel sounds heard. Central nervous system: Moving 4 limbs spontaneously.  No focal neurological deficits. Extremities: No cyanosis or clubbing; no edema.  Clean surgical dressing appreciated on exam.  Patient reporting some decreased left lower  extremity motion with intermittent pain. Skin: No petechiae. Psychiatry: Judgement and insight appear normal. Mood & affect appropriate.    Data Reviewed: I have personally reviewed following labs and imaging studies  CBC: Recent Labs  Lab 09/28/24 1640 09/30/24 0924 10/01/24 0430 10/02/24 0419 10/03/24 0521  WBC 11.0*  --  22.4*  --  22.7*  NEUTROABS 7.8*  --  20.6*  --   --   HGB 11.3* 10.6* 9.9* 10.4* 9.6*  HCT 36.4 34.2* 32.0* 32.9* 30.9*  MCV 84.5  --  83.6  --  83.5  PLT 196  --  235  --  243   Basic Metabolic Panel: Recent Labs  Lab 09/28/24 1640 10/01/24 0430 10/02/24 0419 10/03/24 0521  NA 136 130* 135 133*  K 4.0 4.3 3.6 3.8  CL 101 97* 102 101  CO2 24 24 25 25   GLUCOSE 120* 118* 109* 111*  BUN 17 17 15 16   CREATININE 0.65 0.66 0.48 0.48  CALCIUM 8.6* 8.1* 7.7* 7.4*  MG  --   --  2.1  --    GFR: Estimated Creatinine Clearance: 38.1 mL/min (by C-G formula based on SCr of 0.48 mg/dL).  Liver Function Tests: Recent Labs  Lab 09/28/24 1640  AST 28  ALT 16  ALKPHOS 146*  BILITOT 0.6  PROT 6.6  ALBUMIN 3.8    Recent Results (from the past 240 hours)  MRSA Next Gen by PCR, Nasal     Status: None   Collection Time: 10/02/24  6:07 PM   Specimen: Nasal Mucosa; Nasal Swab  Result Value Ref Range Status   MRSA by PCR Next Gen NOT DETECTED NOT DETECTED Final    Comment: (NOTE) The GeneXpert MRSA Assay (FDA approved for NASAL specimens only), is one component of a comprehensive MRSA colonization surveillance program. It is not intended to diagnose MRSA infection nor to guide or monitor treatment for MRSA infections. Test performance is not FDA approved in patients less than 79 years old. Performed at [facility], [address] [place], [place] [address]      Radiology Studies: No results found.  Scheduled Meds:  apixaban  2.5 mg Oral BID   Chlorhexidine  Gluconate Cloth  6 each Topical Daily   diltiazem   240 mg Oral Daily   hydroxyurea   500 mg  Oral Once per day on Monday Tuesday Wednesday Thursday Friday   levothyroxine   50 mcg Oral QAC breakfast   melatonin  3 mg Oral QHS   metoprolol tartrate  25 mg Oral BID   polyethylene glycol  17 g Oral Daily   senna-docusate  1 tablet Oral QHS   Continuous Infusions:  Time: 50 minutes  Eric Nunnery, MD [facility] 10/03/2024, 12:07 PM

## 2024-10-03 NOTE — Progress Notes (Signed)
 1500 RN made Dr Ricky aware that patient  appears to be NSR with occ. PVC rate 71, resting comfortable. Will be transfering to room 332 as soon as possible.   1552: Made Dr Ricky aware that appears to be back in afib HR 90-109, asypmtomatic, and asked if ok to proceed with transfer to 332. MD approved transfer and informed RN that patient will be getting another dose of metoprolol po later today.   1626: RN made Alyssa RN aware of HR 98-109, and back in Afib, asymptomatic, and that MD ok with transfer with metoprolol po being scheduled later today.,

## 2024-10-03 NOTE — Progress Notes (Signed)
  Echocardiogram 2D Echocardiogram has been performed.  [place]ora [place] R 10/03/2024, 10:10 AM

## 2024-10-03 NOTE — Progress Notes (Signed)
 PHARMACY - ANTICOAGULATION CONSULT NOTE  Pharmacy Consult for heparin Indication: atrial fibrillation  Allergies  Allergen Reactions   Codeine Nausea And Vomiting   Diovan [Valsartan] Itching and Rash   Lamisil [Terbinafine] Swelling   Penicillins Rash and Other (See Comments)    Breaking out in whelps     Patient Measurements: Height: 5' 1 (154.9 cm) Weight: 49.3 kg (108 lb 11 oz) IBW/kg (Calculated) : 47.8 HEPARIN DW (KG): 49.3  Vital Signs: Temp: 98.6 F (37 C) (10/18 2000) Temp Source: Oral (10/18 2000) BP: 143/70 (10/18 1845) Pulse Rate: 75 (10/18 1845)  Labs: Recent Labs    09/30/24 0924 10/01/24 0430 10/02/24 0419 10/02/24 2300  HGB 10.6* 9.9* 10.4*  --   HCT 34.2* 32.0* 32.9*  --   PLT  --  235  --   --   HEPARINUNFRC  --   --   --  <0.10*  CREATININE  --  0.66 0.48  --     Estimated Creatinine Clearance: 38.1 mL/min (by C-G formula based on SCr of 0.48 mg/dL).   Medical History: Past Medical History:  Diagnosis Date   Hypertension    Influenza 11/2012   Osteoporosis    Polycythemia    Polycythemia vera(238.4) 02/25/2013   Diagnosed August 2005 on Hydrea  500 mg 3 times a week with excellent control.    Prolapse, uterus, congenital    ring placement    Medications:  Medications Prior to Admission  Medication Sig Dispense Refill Last Dose/Taking   aspirin  81 MG tablet Take 81 mg by mouth every evening.   09/27/2024 at  6:00 PM   cetirizine (ZYRTEC) 10 MG tablet Take 10 mg by mouth daily as needed for allergies.   Past Week   diltiazem  (CARDIZEM  CD) 180 MG 24 hr capsule Take 1 capsule (180 mg total) by mouth daily. 90 capsule 1 09/28/2024 at  1:15 PM   hydroxyurea  (HYDREA ) 500 MG capsule May take with food to minimize GI side effects on Monday - Friday only (Patient taking differently: Take 500 mg by mouth daily. May take with food to minimize GI side effects on Monday - Friday only) 60 capsule 1 09/28/2024 Noon   hydrOXYzine  (VISTARIL ) 25 MG  capsule Take 1 capsule (25 mg total) by mouth every 8 (eight) hours as needed. (Patient taking differently: Take 25 mg by mouth every 8 (eight) hours as needed for anxiety or itching.) 30 capsule 0 Unknown   levothyroxine  (SYNTHROID ) 50 MCG tablet Take 1 tablet (50 mcg total) by mouth daily before breakfast. 90 tablet 1 09/28/2024 Morning   mometasone (NASONEX) 50 MCG/ACT nasal spray Place 2 sprays into the nose daily as needed (congestion).   Past Month   Vitamin D , Ergocalciferol , (DRISDOL ) 1.25 MG (50000 UNIT) CAPS capsule Take 1 capsule (50,000 Units total) by mouth every 14 (fourteen) days. (Patient taking differently: Take 50,000 Units by mouth every 30 (thirty) days.) 6 capsule 3 09/07/2024    Assessment: Pharmacy consulted to dose heparin in patient with atrial fibrillation.  She is not on anticoagulation prior to admission.  Hgb 10.4  10/19 AM update:  Heparin level sub-therapeutic   Goal of Therapy:  Heparin level 0.3-0.7 units/ml Monitor platelets by anticoagulation protocol: Yes   Plan:  Inc heparin to 850 units/hr Heparin level in 8 hours  Lynwood Mckusick, PharmD, BCPS Clinical Pharmacist Phone: [phone]

## 2024-10-03 NOTE — Progress Notes (Signed)
 PT Cancellation Note  Patient Details Name: [name] MRN: [id number] DOB: [birth year]   Cancelled Treatment:    Reason Eval/Treat Not Completed: Medical issues which prohibited therapy   PT has been moved to ICU will need a new order once medically stable to resume therapy again.  Montie Metro, PT CLT [phone]  10/03/2024, 9:42 AM

## 2024-10-04 ENCOUNTER — Telehealth (HOSPITAL_COMMUNITY): Payer: Self-pay

## 2024-10-04 ENCOUNTER — Other Ambulatory Visit (HOSPITAL_COMMUNITY): Payer: Self-pay

## 2024-10-04 ENCOUNTER — Inpatient Hospital Stay (HOSPITAL_COMMUNITY)

## 2024-10-04 DIAGNOSIS — S72352D Displaced comminuted fracture of shaft of left femur, subsequent encounter for closed fracture with routine healing: Secondary | ICD-10-CM | POA: Diagnosis not present

## 2024-10-04 LAB — URINALYSIS, ROUTINE W REFLEX MICROSCOPIC
Bilirubin Urine: NEGATIVE
Glucose, UA: 50 mg/dL — AB
Ketones, ur: NEGATIVE mg/dL
Nitrite: NEGATIVE
Protein, ur: 100 mg/dL — AB
Specific Gravity, Urine: 1.035 — ABNORMAL HIGH (ref 1.005–1.030)
WBC, UA: >50 WBC/hpf (ref 0–5)
pH: 5 (ref 5.0–8.0)

## 2024-10-04 LAB — CBC
HCT: 28.2 % — ABNORMAL LOW (ref 36.0–46.0)
Hemoglobin: 8.8 g/dL — ABNORMAL LOW (ref 12.0–15.0)
MCH: 26.3 pg (ref 26.0–34.0)
MCHC: 31.2 g/dL (ref 30.0–36.0)
MCV: 84.2 fL (ref 80.0–100.0)
Platelets: 261 K/uL (ref 150–400)
RBC: 3.35 MIL/uL — ABNORMAL LOW (ref 3.87–5.11)
RDW: 21.7 % — ABNORMAL HIGH (ref 11.5–15.5)
WBC: 22.2 K/uL — ABNORMAL HIGH (ref 4.0–10.5)
nRBC: 0 % (ref 0.0–0.2)

## 2024-10-04 LAB — BASIC METABOLIC PANEL WITH GFR
Anion gap: 7 (ref 5–15)
BUN: 17 mg/dL (ref 8–23)
CO2: 26 mmol/L (ref 22–32)
Calcium: 7.4 mg/dL — ABNORMAL LOW (ref 8.9–10.3)
Chloride: 100 mmol/L (ref 98–111)
Creatinine, Ser: 0.54 mg/dL (ref 0.44–1.00)
GFR, Estimated: >60 mL/min (ref >60)
Glucose, Bld: 99 mg/dL (ref 70–99)
Potassium: 3.9 mmol/L (ref 3.5–5.1)
Sodium: 132 mmol/L — ABNORMAL LOW (ref 135–145)

## 2024-10-04 LAB — PROCALCITONIN: Procalcitonin: 0.88 ng/mL

## 2024-10-04 LAB — MAGNESIUM: Magnesium: 2.4 mg/dL (ref 1.7–2.4)

## 2024-10-04 MED ORDER — MELATONIN 3 MG PO TABS
4.5000 mg | ORAL_TABLET | Freq: Every day | ORAL | Status: DC
  Administered 2024-10-04: 4.5 mg via ORAL
  Filled 2024-10-04: qty 2

## 2024-10-04 NOTE — Plan of Care (Signed)

## 2024-10-04 NOTE — Progress Notes (Signed)
 Mobility Specialist Progress Note:    10/04/24 1010  Mobility  Activity Ambulated with assistance  Level of Assistance Minimal assist, patient does 75% or more  Assistive Device Front wheel walker  Distance Ambulated (ft) 6 ft  Range of Motion/Exercises Active;All extremities  LLE Weight Bearing Per Provider Order WBAT  Activity Response Tolerated well  Mobility Referral Yes  Mobility visit 1 Mobility  Mobility Specialist Start Time (ACUTE ONLY) 1010  Mobility Specialist Stop Time (ACUTE ONLY) 1030  Mobility Specialist Time Calculation (min) (ACUTE ONLY) 20 min   Pt received in bed, agreeable to mobility. Required MinA to stand and CGA to ambulate with RW. Tolerated well, Left in chair. NT in room, all needs met.  Maven Rosander Mobility Specialist Please contact via Special educational needs teacher or  Rehab office at [phone]

## 2024-10-04 NOTE — Care Management Important Message (Signed)
 Important Message  Patient Details  Name: [name] MRN: [id number] Date of Birth: 1936-12-06   Important Message Given:  Yes - Medicare IM     Tyrie Porzio L Rio Taber 10/04/2024, 12:35 PM

## 2024-10-04 NOTE — TOC Progression Note (Addendum)
 Transition of Care [facility]) - Progression Note    Patient Details  Name: [name] MRN: [id number] Date of Birth: Sep 18, 1936  [facility] CM/SW Contact  Hoy DELENA Bigness, LCSW Phone Number: 10/04/2024, 10:52 AM  Clinical Narrative:    CSW reviewed bed offers with pt's daughter. Pt's daughter accepted offer for placement at [facility]. Shara has been updated and is currently pending.   ADDENDUM: Insurance shara has been approved from 10/20 - 10/22, next review 10/22, navi auth id [id number].    Barriers to Discharge: Continued Medical Work up               Expected Discharge Plan and Services     Post Acute Care Choice: Durable Medical Equipment Living arrangements for the past 2 months: Single Family Home                                       Social Drivers of Health (SDOH) Interventions SDOH Screenings   Food Insecurity: No Food Insecurity (09/28/2024)  Housing: Low Risk  (09/28/2024)  Transportation Needs: No Transportation Needs (09/28/2024)  Utilities: Not At Risk (09/28/2024)  Depression (PHQ2-9): Low Risk  (08/23/2024)  Physical Activity: Insufficiently Active (08/23/2024)  Social Connections: Socially Isolated (09/28/2024)  Stress: Stress Concern Present (08/23/2024)  Tobacco Use: Low Risk  (09/30/2024)    Readmission Risk Interventions    10/04/2024   10:52 AM 09/30/2024    1:52 PM 09/29/2024    1:57 PM  Readmission Risk Prevention Plan  Post Dischage Appt Complete    Medication Screening Complete Complete Complete  Transportation Screening Complete Complete Complete

## 2024-10-04 NOTE — Progress Notes (Addendum)
 PROGRESS NOTE    [name]  FMW:[id number] DOB: 05-10-1936 DOA: 09/28/2024 PCP: Mancil Pfeiffer, PA-C     Subjective: The patient was seen examined this morning, stable no acute distress, denies have any palpitation or chest pain Cardizem  drip has been discontinued     Brief Narrative:  [name] is a 88 y.o. female with medical history significant for hypertension, hypothyroidism, polycythemia vera, anxiety. Patient presented to the ED with reports of a fall.  Patient was holding a cane and trying to open door of her car, after shopping at [facility].  Her knee twisted and she fell onto the floor.   Assessment & Plan:   Principal Problem:   Closed displaced comminuted fracture of shaft of left femur (HCC) Active Problems:   Polycythemia vera (HCC)   Hypothyroid   Primary hypertension   Generalized anxiety disorder with panic attacks   Atrial flutter (HCC)   Assessment and Plan:   Closed displaced comminuted fracture of left femur -sustained by mechanical fall.  X-ray shows - acute comminuted distal femoral diaphyseal fracture with mild apex anterior angulation. - surgical fixation of distal femur per orthopedics 10/16. - Continue as needed analgesics, DVT prophylaxis on Eliquis  - Per orthopedic recommendations weightbearing as tolerated continue to wear knee immobilizer. -PT/OT has recommended a skilled nursing facility at discharge -pending approval  Atrial flutter/atrial fibrillation with RVR  - Per patient prior history of A-fib-postsurgery developed A-fib with RVR - Status post stabilization by Cardizem  drip, discontinued, switch to diltiazem  240 mg daily, plus metoprolol - Given CHA2DS2-VASc score >3 patient has been started on Eliquis. - 2D echo: Demonstrating preserved ejection fraction (60-65%), no wall motion abnormalities and not significant valvular disorder.  Transient hyponatremia:  - Monitoring remained stable  Polycythemia vera -  Continue hydroxyurea  - Stable   Hypertension - Continue adjusted dose of Cardizem  and now metoprolol - BP stable   Hypothyroidism - Continue Synthroid   Constipation.   - Continue to maintain adequate hydration - Will schedule MiraLAX and Senokot.  Leukocytosis  - Likely reactive postop denies having fever, chills, no resp distress - Will obtain chest x-ray and UA to rule out any overt infection - We are holding use of antibiotics   insomnia - Continue the use of as needed melatonin.    DVT prophylaxis: Eliquis. Code Status: FULL code Family Communication: No family at bedside. disposition Plan: Skilled nursing facility in the next 1 ~ 2 days Consults called: Orthopedics Admission status: Inpt telemetry bed.     Objective: Vitals:   10/03/24 2055 10/03/24 2122 10/04/24 0455 10/04/24 0823  BP: 121/61 121/61 (!) 124/54 (!) 123/59  Pulse: 81 81 73 63  Resp: 18  18   Temp: 99.4 F (37.4 C)  98.7 F (37.1 C)   TempSrc: Oral  Oral   SpO2: 95%  93%   Weight:      Height:        Intake/Output Summary (Last 24 hours) at 10/04/2024 1250 Last data filed at 10/04/2024 0900 Gross per 24 hour  Intake 1081.58 ml  Output --  Net 1081.58 ml   Filed Weights   09/28/24 2201 09/30/24 1104 10/02/24 1813  Weight: 47.9 kg 47.9 kg 49.3 kg    Examination:  General:  AAO x 3,  cooperative, no distress;   HEENT:  Normocephalic, PERRL, otherwise with in Normal limits   Neuro:  CNII-XII intact. , normal motor and sensation, reflexes intact   Lungs:   Clear to auscultation  BL, Respirations unlabored,  No wheezes / crackles  Cardio:    S1/S2, RRR, No murmure, No Rubs or Gallops   Abdomen:  Soft, non-tender, bowel sounds active all four quadrants, no guarding or peritoneal signs.  Muscular  skeletal:  Left femur in immobilizer, surgical wound clean, limited range of motion Limited exam -global generalized weaknesses - in bed, able to move all 4 extremities,   2+ pulses,   symmetric, No pitting edema  Left lower extremity surgical wound -dressing in place with immobilizer        Data Reviewed: I have personally reviewed following labs and imaging studies  CBC: Recent Labs  Lab 09/28/24 1640 09/30/24 0924 10/01/24 0430 10/02/24 0419 10/03/24 0521 10/04/24 0446  WBC 11.0*  --  22.4*  --  22.7* 22.2*  NEUTROABS 7.8*  --  20.6*  --   --   --   HGB 11.3* 10.6* 9.9* 10.4* 9.6* 8.8*  HCT 36.4 34.2* 32.0* 32.9* 30.9* 28.2*  MCV 84.5  --  83.6  --  83.5 84.2  PLT 196  --  235  --  243 261   Basic Metabolic Panel: Recent Labs  Lab 09/28/24 1640 10/01/24 0430 10/02/24 0419 10/03/24 0521 10/04/24 0446  NA 136 130* 135 133* 132*  K 4.0 4.3 3.6 3.8 3.9  CL 101 97* 102 101 100  CO2 24 24 25 25 26   GLUCOSE 120* 118* 109* 111* 99  BUN 17 17 15 16 17   CREATININE 0.65 0.66 0.48 0.48 0.54  CALCIUM 8.6* 8.1* 7.7* 7.4* 7.4*  MG  --   --  2.1  --  2.4   GFR: Estimated Creatinine Clearance: 38.1 mL/min (by C-G formula based on SCr of 0.54 mg/dL).  Liver Function Tests: Recent Labs  Lab 09/28/24 1640  AST 28  ALT 16  ALKPHOS 146*  BILITOT 0.6  PROT 6.6  ALBUMIN 3.8    Recent Results (from the past 240 hours)  MRSA Next Gen by PCR, Nasal     Status: None   Collection Time: 10/02/24  6:07 PM   Specimen: Nasal Mucosa; Nasal Swab  Result Value Ref Range Status   MRSA by PCR Next Gen NOT DETECTED NOT DETECTED Final    Comment: (NOTE) The GeneXpert MRSA Assay (FDA approved for NASAL specimens only), is one component of a comprehensive MRSA colonization surveillance program. It is not intended to diagnose MRSA infection nor to guide or monitor treatment for MRSA infections. Test performance is not FDA approved in patients less than 30 years old. Performed at [facility], [address], [place], [place] [address]      Radiology Studies: DG CHEST PORT 1 VIEW Result Date: 10/04/2024 CLINICAL DATA:  Shortness of breath. EXAM: PORTABLE CHEST 1  VIEW COMPARISON:  09/28/2024 FINDINGS: Stable cardiomegaly. Unchanged mediastinal contours with aortic atherosclerosis. Increase in interstitial thickening and septal thickening at the bases suspicious for pulmonary edema. Suspect small pleural effusions. No confluent opacity. No pneumothorax. The bones are subjectively under mineralized. IMPRESSION: Cardiomegaly with increased interstitial thickening and septal thickening at the bases suspicious for pulmonary edema. Suspect small pleural effusions. Electronically Signed   By: Andrea Gasman M.D.   On: 10/04/2024 11:16   ECHOCARDIOGRAM COMPLETE Result Date: 10/03/2024    ECHOCARDIOGRAM REPORT   Patient Name:   TENICIA GURAL Date of Exam: 10/03/2024 Medical Rec #:  [id number]      Height:       61.0 in Accession #:    [id number]  Weight:       108.7 lb Date of Birth:  Jul 18, 1936       BSA:          1.458 m Patient Age:    88 years       BP:           121/40 mmHg Patient Gender: F              HR:           82 bpm. Exam Location:  [facility] Procedure: 2D Echo, Cardiac Doppler and Color Doppler (Both Spectral and Color            Flow Doppler were utilized during procedure). Indications:    I48.92* Unspecified atrial flutter  History:        Patient has no prior history of Echocardiogram examinations.                 Abnormal ECG, Arrythmias:Atrial Flutter; Risk                 Factors:Hypertension.  Sonographer:    Ellouise Mose RDCS Referring Phys: [id number] SANTOSH SIGDEL IMPRESSIONS  1. Left ventricular ejection fraction, by estimation, is 60 to 65%. The left ventricle has normal function. The left ventricle has no regional wall motion abnormalities. Left ventricular diastolic parameters are indeterminate.  2. Right ventricular systolic function is normal. The right ventricular size is normal. Tricuspid regurgitation signal is inadequate for assessing PA pressure.  3. Left atrial size was mildly dilated.  4. The mitral valve is normal in structure. Trivial  mitral valve regurgitation. No evidence of mitral stenosis.  5. The tricuspid valve is abnormal.  6. The aortic valve is tricuspid. Aortic valve regurgitation is mild.  7. The inferior vena cava is dilated in size with >50% respiratory variability, suggesting right atrial pressure of 8 mmHg. Comparison(s): No prior Echocardiogram. FINDINGS  Left Ventricle: Left ventricular ejection fraction, by estimation, is 60 to 65%. The left ventricle has normal function. The left ventricle has no regional wall motion abnormalities. The left ventricular internal cavity size was normal in size. There is  no left ventricular hypertrophy. Left ventricular diastolic parameters are indeterminate. Right Ventricle: The right ventricular size is normal. Right vetricular wall thickness was not well visualized. Right ventricular systolic function is normal. Tricuspid regurgitation signal is inadequate for assessing PA pressure. Left Atrium: Left atrial size was mildly dilated. Right Atrium: Right atrial size was normal in size. Pericardium: There is no evidence of pericardial effusion. Mitral Valve: The mitral valve is normal in structure. Trivial mitral valve regurgitation. No evidence of mitral valve stenosis. Tricuspid Valve: The tricuspid valve is abnormal. Tricuspid valve regurgitation is trivial. No evidence of tricuspid stenosis. Aortic Valve: The aortic valve is tricuspid. Aortic valve regurgitation is mild. Aortic valve mean gradient measures 2.5 mmHg. Aortic valve peak gradient measures 6.1 mmHg. Aortic valve area, by VTI measures 3.34 cm. Pulmonic Valve: The pulmonic valve was not well visualized. Pulmonic valve regurgitation is not visualized. No evidence of pulmonic stenosis. Aorta: The aortic root and ascending aorta are structurally normal, with no evidence of dilitation. Venous: The inferior vena cava is dilated in size with greater than 50% respiratory variability, suggesting right atrial pressure of 8 mmHg. IAS/Shunts:  No atrial level shunt detected by color flow Doppler.  LEFT VENTRICLE PLAX 2D LVIDd:         4.30 cm LVIDs:         2.50 cm LV PW:  1.10 cm LV IVS:        1.00 cm LVOT diam:     2.20 cm LV SV:         67 LV SV Index:   46 LVOT Area:     3.80 cm LV IVRT:       53 msec  LV Volumes (MOD) LV vol d, MOD A2C: 67.1 ml LV vol d, MOD A4C: 71.0 ml LV vol s, MOD A2C: 24.2 ml LV vol s, MOD A4C: 28.0 ml LV SV MOD A2C:     42.9 ml LV SV MOD A4C:     71.0 ml LV SV MOD BP:      44.9 ml RIGHT VENTRICLE             IVC RV S prime:     13.70 cm/s  IVC diam: 2.30 cm TAPSE (M-mode): 1.9 cm LEFT ATRIUM             Index        RIGHT ATRIUM           Index LA diam:        3.90 cm 2.68 cm/m   RA Area:     18.20 cm LA Vol (A2C):   39.7 ml 27.24 ml/m  RA Volume:   43.80 ml  30.05 ml/m LA Vol (A4C):   63.5 ml 43.57 ml/m LA Biplane Vol: 54.0 ml 37.05 ml/m  AORTIC VALVE AV Area (Vmax):    3.36 cm AV Area (Vmean):   3.60 cm AV Area (VTI):     3.34 cm AV Vmax:           123.20 cm/s AV Vmean:          71.209 cm/s AV VTI:            0.201 m AV Peak Grad:      6.1 mmHg AV Mean Grad:      2.5 mmHg LVOT Vmax:         109.00 cm/s LVOT Vmean:        67.367 cm/s LVOT VTI:          0.176 m LVOT/AV VTI ratio: 0.88  AORTA Ao Root diam: 2.90 cm Ao Asc diam:  3.00 cm MITRAL VALVE MV Area (PHT): 3.99 cm    SHUNTS MV Decel Time: 190 msec    Systemic VTI:  0.18 m MV E velocity: 82.10 cm/s  Systemic Diam: 2.20 cm Dorn Ross MD Electronically signed by Dorn Ross MD Signature Date/Time: 10/03/2024/12:22:17 PM    Final     Scheduled Meds:  apixaban  2.5 mg Oral BID   Chlorhexidine  Gluconate Cloth  6 each Topical Daily   diltiazem   240 mg Oral Daily   hydroxyurea   500 mg Oral Once per day on Monday Tuesday Wednesday Thursday Friday   levothyroxine   50 mcg Oral QAC breakfast   melatonin  4.5 mg Oral QHS   metoprolol tartrate  25 mg Oral BID   polyethylene glycol  17 g Oral Daily   senna-docusate  1 tablet Oral QHS    Continuous Infusions:  Time: 50 minutes  Adriana DELENA Grams, MD [facility] 10/04/2024, 12:50 PM

## 2024-10-04 NOTE — Progress Notes (Signed)
 Mobility Specialist Progress Note:    10/04/24 1436  Mobility  Activity Pivoted/transferred from chair to bed  Level of Assistance Minimal assist, patient does 75% or more  Assistive Device Front wheel walker  Distance Ambulated (ft) 3 ft  Range of Motion/Exercises Active;All extremities  LLE Weight Bearing Per Provider Order WBAT  Activity Response Tolerated well  Mobility Referral Yes  Mobility visit 1 Mobility  Mobility Specialist Start Time (ACUTE ONLY) 1436  Mobility Specialist Stop Time (ACUTE ONLY) 1451  Mobility Specialist Time Calculation (min) (ACUTE ONLY) 15 min   Pt received in bed, agreeable to mobility. Required MinA to stand and transfer with RW. Tolerated well, NT in room. All needs met.  Amaree Leeper Mobility Specialist Please contact via Special educational needs teacher or  Rehab office at [phone]

## 2024-10-04 NOTE — Telephone Encounter (Signed)
 Pharmacy Patient Advocate Encounter  Insurance verification completed.    The patient is insured through [facility]. Patient has Medicare and is not eligible for a copay card, but may be able to apply for patient assistance or Medicare RX Payment Plan (Patient Must reach out to their plan, if eligible for payment plan), if available.    Ran test claim for Eliquis 5mg  and the current 30 day co-pay is $4.80.   This test claim was processed through [facility]- copay amounts may vary at other pharmacies due to pharmacy/plan contracts, or as the patient moves through the different stages of their insurance plan.

## 2024-10-05 ENCOUNTER — Encounter (HOSPITAL_COMMUNITY): Payer: Self-pay | Admitting: Orthopedic Surgery

## 2024-10-05 DIAGNOSIS — S72352G Displaced comminuted fracture of shaft of left femur, subsequent encounter for closed fracture with delayed healing: Secondary | ICD-10-CM

## 2024-10-05 LAB — CBC WITH DIFFERENTIAL/PLATELET
Abs Immature Granulocytes: 1.3 K/uL — ABNORMAL HIGH (ref 0.00–0.07)
Band Neutrophils: 4 %
Basophils Absolute: 0.7 K/uL — ABNORMAL HIGH (ref 0.0–0.1)
Basophils Relative: 3 %
Eosinophils Absolute: 0.7 K/uL — ABNORMAL HIGH (ref 0.0–0.5)
Eosinophils Relative: 3 %
HCT: 27.3 % — ABNORMAL LOW (ref 36.0–46.0)
Hemoglobin: 8.5 g/dL — ABNORMAL LOW (ref 12.0–15.0)
Lymphocytes Relative: 12 %
Lymphs Abs: 2.6 K/uL (ref 0.7–4.0)
MCH: 25.8 pg — ABNORMAL LOW (ref 26.0–34.0)
MCHC: 31.1 g/dL (ref 30.0–36.0)
MCV: 82.7 fL (ref 80.0–100.0)
Metamyelocytes Relative: 3 %
Monocytes Absolute: 1.8 K/uL — ABNORMAL HIGH (ref 0.1–1.0)
Monocytes Relative: 8 %
Myelocytes: 3 %
Neutro Abs: 15 K/uL — ABNORMAL HIGH (ref 1.7–7.7)
Neutrophils Relative %: 64 %
Platelets: 273 K/uL (ref 150–400)
RBC: 3.3 MIL/uL — ABNORMAL LOW (ref 3.87–5.11)
RDW: 21.5 % — ABNORMAL HIGH (ref 11.5–15.5)
Smear Review: NORMAL
WBC: 22 K/uL — ABNORMAL HIGH (ref 4.0–10.5)
nRBC: 0.1 % (ref 0.0–0.2)

## 2024-10-05 LAB — IRON AND TIBC
Iron: 25 ug/dL — ABNORMAL LOW (ref 28–170)
Saturation Ratios: 13 % (ref 10.4–31.8)
TIBC: 192 ug/dL — ABNORMAL LOW (ref 250–450)
UIBC: 167 ug/dL

## 2024-10-05 MED ORDER — LEVOFLOXACIN 500 MG PO TABS: 500.0000 mg | ORAL_TABLET | Freq: Every day | ORAL | 0 refills | Status: AC

## 2024-10-05 MED ORDER — LEVOFLOXACIN 500 MG PO TABS
500.0000 mg | ORAL_TABLET | Freq: Every day | ORAL | Status: DC
  Administered 2024-10-05: 500 mg via ORAL
  Filled 2024-10-05: qty 1

## 2024-10-05 MED ORDER — APIXABAN 2.5 MG PO TABS: 2.5000 mg | ORAL_TABLET | Freq: Two times a day (BID) | ORAL | 2 refills | Status: DC

## 2024-10-05 MED ORDER — POLYETHYLENE GLYCOL 3350 17 G PO PACK: 17.0000 g | PACK | Freq: Every day | ORAL | 0 refills | Status: DC

## 2024-10-05 MED ORDER — SENNOSIDES-DOCUSATE SODIUM 8.6-50 MG PO TABS: 1.0000 | ORAL_TABLET | Freq: Every day | ORAL | 0 refills | Status: DC

## 2024-10-05 MED ORDER — ACETAMINOPHEN 325 MG PO TABS: 650.0000 mg | ORAL_TABLET | Freq: Four times a day (QID) | ORAL | 0 refills | Status: DC | PRN

## 2024-10-05 MED ORDER — METOPROLOL TARTRATE 25 MG PO TABS: 25.0000 mg | ORAL_TABLET | Freq: Two times a day (BID) | ORAL | 3 refills | Status: DC

## 2024-10-05 MED ORDER — DILTIAZEM HCL ER COATED BEADS 240 MG PO CP24: 240.0000 mg | ORAL_CAPSULE | Freq: Every day | ORAL | 3 refills | Status: DC

## 2024-10-05 NOTE — Progress Notes (Signed)
 Mobility Specialist Progress Note:    10/05/24 0930  Mobility  Activity Pivoted/transferred to/from [facility]  Level of Assistance Minimal assist, patient does 75% or more  Assistive Device None  Distance Ambulated (ft) 3 ft  Range of Motion/Exercises Active;All extremities  LLE Weight Bearing Per Provider Order WBAT  Activity Response Tolerated well  Mobility Referral Yes  Mobility visit 1 Mobility  Mobility Specialist Start Time (ACUTE ONLY) 0930  Mobility Specialist Stop Time (ACUTE ONLY) 0950  Mobility Specialist Time Calculation (min) (ACUTE ONLY) 20 min   Pt received in bed, requesting assistance to [facility]. Required MinA to stand and transfer with no AD. Tolerated well, left sitting on BSC. Call bell in reach, NT notified. All needs met.  Aveen Stansel Mobility Specialist Please contact via Special educational needs teacher or  Rehab office at [phone]

## 2024-10-05 NOTE — Progress Notes (Signed)
 Report called to [facility]. IV sites removed, sites WNL.

## 2024-10-05 NOTE — TOC Transition Note (Signed)
 Transition of Care [facility]) - Discharge Note   Patient Details  Name: [name] MRN: [id number] Date of Birth: [birth year]  [facility] CM/SW Contact:  Sharlyne Stabs, RN Phone Number: 10/05/2024, 11:57 AM   Clinical Narrative:   Patient medically ready to discharge to [facility] nursing center. Clinicals sent in the hub, and Called her daughter, Sharlet. Kerri provided a room number. RN calling report.    Final next level of care: Skilled Nursing Facility Barriers to Discharge: Barriers Resolved   Patient Goals and CMS Choice Patient states their goals for this hospitalization and ongoing recovery are:: Go to [facility] CMS Medicare.gov Compare Post Acute Care list provided to:: Patient Choice offered to / list presented to : Patient [place] ownership interest in [facility].provided to:: Patient    Discharge Placement                Patient to be transferred to facility by: Staff Name of family member notified: Daughter Patient and family notified of of transfer: 10/05/24  Discharge Plan and Services Additional resources added to the After Visit Summary for       Post Acute Care Choice: Durable Medical Equipment                Social Drivers of Health (SDOH) Interventions SDOH Screenings   Food Insecurity: No Food Insecurity (09/28/2024)  Housing: Low Risk  (09/28/2024)  Transportation Needs: No Transportation Needs (09/28/2024)  Utilities: Not At Risk (09/28/2024)  Depression (PHQ2-9): Low Risk  (08/23/2024)  Physical Activity: Insufficiently Active (08/23/2024)  Social Connections: Socially Isolated (09/28/2024)  Stress: Stress Concern Present (08/23/2024)  Tobacco Use: Low Risk  (09/30/2024)     Readmission Risk Interventions    10/04/2024   10:52 AM 09/30/2024    1:52 PM 09/29/2024    1:57 PM  Readmission Risk Prevention Plan  Post Dischage Appt Complete    Medication Screening Complete Complete Complete  Transportation Screening Complete  Complete Complete

## 2024-10-05 NOTE — Discharge Summary (Signed)
 Physician Discharge Summary   Patient: [name] MRN: [id number] DOB: May 05, 1936  Admit date:     09/28/2024  Discharge date: 10/05/24  Discharge Physician: Adriana DELENA Grams   PCP: Mancil Pfeiffer, [place]   Recommendations at discharge:   Follow-up with PCP in 1 week Follow orthopedic team in 2-4 weeks Continue PT OT, fall precautions Follow-up with cardiology, A-fib clinic  Discharge Diagnoses: Principal Problem:   Closed displaced comminuted fracture of shaft of left femur (HCC) Active Problems:   Polycythemia vera (HCC)   Hypothyroid   Primary hypertension   Generalized anxiety disorder with panic attacks   Atrial flutter (HCC)   [name] is a 88 y.o. female with medical history significant for hypertension, hypothyroidism, polycythemia vera, anxiety. Patient presented to the ED with reports of a fall.  Patient was holding a cane and trying to open door of her car, after shopping at [facility].  Her knee twisted and she fell onto the floor.  Closed displaced comminuted fracture of left femur -sustained by mechanical fall.  X-ray shows - acute comminuted distal femoral diaphyseal fracture with mild apex anterior angulation. - surgical fixation of distal femur per orthopedics 10/16. - Continue as needed analgesics, DVT prophylaxis on Eliquis   - Per orthopedic recommendations weightbearing as tolerated continue to wear knee immobilizer. -PT/OT has recommended a skilled nursing facility at discharge -pending approval   Atrial flutter/atrial fibrillation with RVR  - Per patient prior history of A-fib-postsurgery developed A-fib with RVR - Status post stabilization by Cardizem  drip, discontinued, switch to diltiazem  240 mg daily, plus metoprolol - Given CHA2DS2-VASc score >3 patient has been started on Eliquis. - 2D echo: Demonstrating preserved ejection fraction (60-65%), no wall motion abnormalities and not significant valvular disorder. - Follow-up with  cardiology as an outpatient -further evaluation and recommendations   Transient hyponatremia: Resolved   Polycythemia vera - Continue hydroxyurea  - Stable   Hypertension -stable, on Cardizem  and metoprolol    Hypothyroidism - Continue Synthroid    Constipation.   - Continue to maintain adequate hydration - Will schedule MiraLAX and Senokot.   Leukocytosis  - Likely reactive postop denies having fever, chills, no resp distress - Chest x-ray, effusion, UA negative, empiric p.o. Levaquin for total of 5 days initiated           Consultants: Orthopedic  Procedures performed: S/P ORIF left hip Disposition: Skilled nursing facility Diet recommendation:  Discharge Diet Orders (From admission, onward)     Start     Ordered   10/05/24 0000  Diet - low sodium heart healthy        10/05/24 1104           Cardiac diet DISCHARGE MEDICATION: Allergies as of 10/05/2024       Reactions   Codeine Nausea And Vomiting   Diovan [valsartan] Itching, Rash   Lamisil [terbinafine] Swelling   Penicillins Rash, Other (See Comments)   Breaking out in whelps        Medication List     STOP taking these medications    aspirin  81 MG tablet   hydrOXYzine  25 MG capsule Commonly known as: VISTARIL        TAKE these medications    acetaminophen  325 MG tablet Commonly known as: TYLENOL  Take 2 tablets (650 mg total) by mouth every 6 (six) hours as needed for mild pain (pain score 1-3) or fever (or Fever >/= 101).   apixaban 2.5 MG Tabs tablet Commonly known as: ELIQUIS Take 1  tablet (2.5 mg total) by mouth 2 (two) times daily.   cetirizine 10 MG tablet Commonly known as: ZYRTEC Take 10 mg by mouth daily as needed for allergies.   diltiazem  240 MG 24 hr capsule Commonly known as: CARDIZEM  CD Take 1 capsule (240 mg total) by mouth daily. Start taking on: October 06, 2024 What changed:  medication strength how much to take   hydroxyurea  500 MG capsule Commonly  known as: HYDREA  May take with food to minimize GI side effects on Monday - Friday only What changed:  how much to take how to take this when to take this   levofloxacin 500 MG tablet Commonly known as: LEVAQUIN Take 1 tablet (500 mg total) by mouth daily for 5 days. Start taking on: October 06, 2024   levothyroxine  50 MCG tablet Commonly known as: SYNTHROID  Take 1 tablet (50 mcg total) by mouth daily before breakfast.   metoprolol tartrate 25 MG tablet Commonly known as: LOPRESSOR Take 1 tablet (25 mg total) by mouth 2 (two) times daily.   mometasone 50 MCG/ACT nasal spray Commonly known as: NASONEX Place 2 sprays into the nose daily as needed (congestion).   polyethylene glycol 17 g packet Commonly known as: MIRALAX / GLYCOLAX Take 17 g by mouth daily. Start taking on: October 06, 2024   senna-docusate 8.6-50 MG tablet Commonly known as: Senokot-S Take 1 tablet by mouth at bedtime.   Vitamin D  (Ergocalciferol ) 1.25 MG (50000 UNIT) Caps capsule Commonly known as: DRISDOL  Take 1 capsule (50,000 Units total) by mouth every 14 (fourteen) days. What changed: when to take this               Discharge Care Instructions  (From admission, onward)           Start     Ordered   10/05/24 0000  Discharge wound care:       Comments: RN wound care instructions-follow-up orthopedic   10/05/24 1104            Contact information for after-discharge care     Destination     [facility] .   Service: Skilled Nursing Contact information: [address] [place]  [address] [phone]                    Discharge Exam: Filed Weights   09/28/24 2201 09/30/24 1104 10/02/24 1813  Weight: 47.9 kg 47.9 kg 49.3 kg        General:  AAO x 3,  cooperative, no distress;   HEENT:  Normocephalic, PERRL, otherwise with in Normal limits   Neuro:  CNII-XII intact. , normal motor and sensation, reflexes intact   Lungs:   Clear to  auscultation BL, Respirations unlabored,  No wheezes / crackles  Cardio:    S1/S2, RRR, No murmure, No Rubs or Gallops   Abdomen:  Soft, non-tender, bowel sounds active all four quadrants, no guarding or peritoneal signs.  Muscular  skeletal:  Limited exam  - Left hip surgical wound, range of motion limited due to pain surgery, negative erythema edema or drainage -global generalized weaknesses - in bed, able to move all 4 extremities,   2+ pulses,  symmetric, No pitting edema  Skin:  Dry, warm to touch, hip surgical wound, dressing in place, negative erythema edema or drainage  Wounds: Left hip postsurgical wound          Condition at discharge: fair  The results of significant diagnostics from this hospitalization (  including imaging, microbiology, ancillary and laboratory) are listed below for reference.   Imaging Studies: DG CHEST PORT 1 VIEW Result Date: 10/04/2024 CLINICAL DATA:  Shortness of breath. EXAM: PORTABLE CHEST 1 VIEW COMPARISON:  09/28/2024 FINDINGS: Stable cardiomegaly. Unchanged mediastinal contours with aortic atherosclerosis. Increase in interstitial thickening and septal thickening at the bases suspicious for pulmonary edema. Suspect small pleural effusions. No confluent opacity. No pneumothorax. The bones are subjectively under mineralized. IMPRESSION: Cardiomegaly with increased interstitial thickening and septal thickening at the bases suspicious for pulmonary edema. Suspect small pleural effusions. Electronically Signed   By: Andrea Gasman M.D.   On: 10/04/2024 11:16   ECHOCARDIOGRAM COMPLETE Result Date: 10/03/2024    ECHOCARDIOGRAM REPORT   Patient Name:   AYODELE HARTSOCK Date of Exam: 10/03/2024 Medical Rec #:  [id number]      Height:       61.0 in Accession #:    [id number]     Weight:       108.7 lb Date of Birth:  1936-06-08       BSA:          1.458 m Patient Age:    88 years       BP:           121/40 mmHg Patient Gender: F              HR:           82  bpm. Exam Location:  [facility] Procedure: 2D Echo, Cardiac Doppler and Color Doppler (Both Spectral and Color            Flow Doppler were utilized during procedure). Indications:    I48.92* Unspecified atrial flutter  History:        Patient has no prior history of Echocardiogram examinations.                 Abnormal ECG, Arrythmias:Atrial Flutter; Risk                 Factors:Hypertension.  Sonographer:    Ellouise Mose RDCS Referring Phys: [id number] SANTOSH SIGDEL IMPRESSIONS  1. Left ventricular ejection fraction, by estimation, is 60 to 65%. The left ventricle has normal function. The left ventricle has no regional wall motion abnormalities. Left ventricular diastolic parameters are indeterminate.  2. Right ventricular systolic function is normal. The right ventricular size is normal. Tricuspid regurgitation signal is inadequate for assessing PA pressure.  3. Left atrial size was mildly dilated.  4. The mitral valve is normal in structure. Trivial mitral valve regurgitation. No evidence of mitral stenosis.  5. The tricuspid valve is abnormal.  6. The aortic valve is tricuspid. Aortic valve regurgitation is mild.  7. The inferior vena cava is dilated in size with >50% respiratory variability, suggesting right atrial pressure of 8 mmHg. Comparison(s): No prior Echocardiogram. FINDINGS  Left Ventricle: Left ventricular ejection fraction, by estimation, is 60 to 65%. The left ventricle has normal function. The left ventricle has no regional wall motion abnormalities. The left ventricular internal cavity size was normal in size. There is  no left ventricular hypertrophy. Left ventricular diastolic parameters are indeterminate. Right Ventricle: The right ventricular size is normal. Right vetricular wall thickness was not well visualized. Right ventricular systolic function is normal. Tricuspid regurgitation signal is inadequate for assessing PA pressure. Left Atrium: Left atrial size was mildly dilated. Right Atrium:  Right atrial size was normal in size. Pericardium: There is no evidence of pericardial effusion. Mitral Valve: The  mitral valve is normal in structure. Trivial mitral valve regurgitation. No evidence of mitral valve stenosis. Tricuspid Valve: The tricuspid valve is abnormal. Tricuspid valve regurgitation is trivial. No evidence of tricuspid stenosis. Aortic Valve: The aortic valve is tricuspid. Aortic valve regurgitation is mild. Aortic valve mean gradient measures 2.5 mmHg. Aortic valve peak gradient measures 6.1 mmHg. Aortic valve area, by VTI measures 3.34 cm. Pulmonic Valve: The pulmonic valve was not well visualized. Pulmonic valve regurgitation is not visualized. No evidence of pulmonic stenosis. Aorta: The aortic root and ascending aorta are structurally normal, with no evidence of dilitation. Venous: The inferior vena cava is dilated in size with greater than 50% respiratory variability, suggesting right atrial pressure of 8 mmHg. IAS/Shunts: No atrial level shunt detected by color flow Doppler.  LEFT VENTRICLE PLAX 2D LVIDd:         4.30 cm LVIDs:         2.50 cm LV PW:         1.10 cm LV IVS:        1.00 cm LVOT diam:     2.20 cm LV SV:         67 LV SV Index:   46 LVOT Area:     3.80 cm LV IVRT:       53 msec  LV Volumes (MOD) LV vol d, MOD A2C: 67.1 ml LV vol d, MOD A4C: 71.0 ml LV vol s, MOD A2C: 24.2 ml LV vol s, MOD A4C: 28.0 ml LV SV MOD A2C:     42.9 ml LV SV MOD A4C:     71.0 ml LV SV MOD BP:      44.9 ml RIGHT VENTRICLE             IVC RV S prime:     13.70 cm/s  IVC diam: 2.30 cm TAPSE (M-mode): 1.9 cm LEFT ATRIUM             Index        RIGHT ATRIUM           Index LA diam:        3.90 cm 2.68 cm/m   RA Area:     18.20 cm LA Vol (A2C):   39.7 ml 27.24 ml/m  RA Volume:   43.80 ml  30.05 ml/m LA Vol (A4C):   63.5 ml 43.57 ml/m LA Biplane Vol: 54.0 ml 37.05 ml/m  AORTIC VALVE AV Area (Vmax):    3.36 cm AV Area (Vmean):   3.60 cm AV Area (VTI):     3.34 cm AV Vmax:           123.20 cm/s  AV Vmean:          71.209 cm/s AV VTI:            0.201 m AV Peak Grad:      6.1 mmHg AV Mean Grad:      2.5 mmHg LVOT Vmax:         109.00 cm/s LVOT Vmean:        67.367 cm/s LVOT VTI:          0.176 m LVOT/AV VTI ratio: 0.88  AORTA Ao Root diam: 2.90 cm Ao Asc diam:  3.00 cm MITRAL VALVE MV Area (PHT): 3.99 cm    SHUNTS MV Decel Time: 190 msec    Systemic VTI:  0.18 m MV E velocity: 82.10 cm/s  Systemic Diam: 2.20 cm Dorn Ross MD Electronically signed by Dorn  Branch MD Signature Date/Time: 10/03/2024/12:22:17 PM    Final    DG FEMUR MIN 2 VIEWS LEFT Result Date: 09/30/2024 CLINICAL DATA:  Known left hip fracture EXAM: DG FEMUR 2+V*L* COMPARISON:  09/28/2024 FLUOROSCOPY TIME:  Radiation Exposure Index (as provided by the fluoroscopic device): 10.76 mGy If the device does not provide the exposure index: Fluoroscopy Time:  2 minute 16 seconds Number of Acquired Images:  9 FINDINGS: Initial spot films again demonstrate the comminuted distal femoral metaphyseal fracture. Left shoulder rod was placed with proximal and distal fixation screws. Fracture fragments are in near anatomic alignment. IMPRESSION: ORIF of left distal femoral fracture. Electronically Signed   By: Oneil Devonshire M.D.   On: 09/30/2024 15:20   DG FEMUR MIN 2 VIEWS LEFT Result Date: 09/30/2024 EXAM: 2 VIEW(S) XRAY OF THE LEFT FEMUR 09/30/2024 02:27:00 PM COMPARISON: 09/28/2024 status post intramedullary rod fixation of comminuted distal left femoral fracture status post left total hip arthroplasty as well. CLINICAL HISTORY: 544450 Closed fracture of left distal femur (HCC) [id number]. S/p left femur IM nail. Closed fracture of left distal femur (HCC) G4001044. S/p left femur IM nail. FINDINGS: BONES AND JOINTS: Status post intramedullary rod fixation of comminuted distal left femoral fracture. Status post left total hip arthroplasty. No acute fracture. No joint dislocation. SOFT TISSUES: The soft tissues are unremarkable. IMPRESSION: 1.  Status post intramedullary rod fixation of a comminuted distal left femoral fracture. Electronically signed by: Lynwood Seip MD 09/30/2024 02:50 PM EDT RP Workstation: [id number]   DG C-Arm 1-60 Min-No Report Result Date: 09/30/2024 Fluoroscopy was utilized by the requesting physician.  No radiographic interpretation.   DG CHEST PORT 1 VIEW Result Date: 09/28/2024 EXAM: 1 VIEW(S) XRAY OF THE CHEST 09/28/2024 07:56:00 PM COMPARISON: None available. CLINICAL HISTORY: Pre-op chest exam [phone]. Pre op. No chest complaints. FINDINGS: LUNGS AND PLEURA: Mild biapical pleural parenchymal scarring. No pulmonary edema. No pleural effusion. No pneumothorax. HEART AND MEDIASTINUM: Atherosclerotic calcifications. No acute abnormality of the cardiac and mediastinal silhouettes. BONES AND SOFT TISSUES: No acute osseous abnormality. IMPRESSION: 1. No acute process. Electronically signed by: Pinkie Pebbles MD 09/28/2024 08:05 PM EDT RP Workstation: [id number]   DG Femur Min 2 Views Left Result Date: 09/28/2024 EXAM: 2 VIEW(S) XRAY OF THE LEFT FEMUR 09/28/2024 04:53:00 PM COMPARISON: Comparison 01/11/2024. CLINICAL HISTORY: Fall. Patient fell while trying to get into car. She complains of left knee pain. Patient has limited mobility of left hip and leg. FINDINGS: BONES AND JOINTS: Status post left hip arthroplasty. Severely comminuted distal left femoral shaft fracture is noted. SOFT TISSUES: The soft tissues are unremarkable. IMPRESSION: 1. Severely comminuted distal left femoral shaft fracture. Electronically signed by: Lynwood Seip MD 09/28/2024 05:12 PM EDT RP Workstation: [id number]   DG Knee Complete 4 Views Left Result Date: 09/28/2024 EXAM: 4 OR MORE VIEW(S) XRAY OF THE LEFT KNEE 09/28/2024 03:47:00 PM COMPARISON: None available. CLINICAL HISTORY: pain. She c/o left knee pain. FINDINGS: BONES AND JOINTS: There is an acute transverse on oblique comminuted fracture of the distal femoral diaphysis. There is mild  apex anterior angulation. The bones are osteopenic. No focal osseous lesion. No dislocation identified. No significant joint effusion. There are moderate degenerative changes of the lateral compartment of the knee with joint space narrowing and osteophyte formation. SOFT TISSUES: There is surrounding soft tissue swelling and edema/hematoma. IMPRESSION: 1. Acute comminuted distal femoral diaphyseal fracture with mild apex anterior angulation. 2. Moderate lateral compartment knee osteoarthritis. 3. Osteopenia. Electronically signed by: Greig Pique MD 09/28/2024 03:54  PM EDT RP Workstation: [id number]    Microbiology: Results for orders placed or performed during the hospital encounter of 09/28/24  MRSA Next Gen by PCR, Nasal     Status: None   Collection Time: 10/02/24  6:07 PM   Specimen: Nasal Mucosa; Nasal Swab  Result Value Ref Range Status   MRSA by PCR Next Gen NOT DETECTED NOT DETECTED Final    Comment: (NOTE) The GeneXpert MRSA Assay (FDA approved for NASAL specimens only), is one component of a comprehensive MRSA colonization surveillance program. It is not intended to diagnose MRSA infection nor to guide or monitor treatment for MRSA infections. Test performance is not FDA approved in patients less than 44 years old. Performed at [facility], [address], [place], [place] [address]     Labs: CBC: Recent Labs  Lab 09/28/24 1640 09/30/24 0924 10/01/24 0430 10/02/24 0419 10/03/24 0521 10/04/24 0446 10/05/24 0425  WBC 11.0*  --  22.4*  --  22.7* 22.2* 22.0*  NEUTROABS 7.8*  --  20.6*  --   --   --  15.0*  HGB 11.3*   < > 9.9* 10.4* 9.6* 8.8* 8.5*  HCT 36.4   < > 32.0* 32.9* 30.9* 28.2* 27.3*  MCV 84.5  --  83.6  --  83.5 84.2 82.7  PLT 196  --  235  --  243 261 273   < > = values in this interval not displayed.   Basic Metabolic Panel: Recent Labs  Lab 09/28/24 1640 10/01/24 0430 10/02/24 0419 10/03/24 0521 10/04/24 0446  NA 136 130* 135 133* 132*  K 4.0 4.3  3.6 3.8 3.9  CL 101 97* 102 101 100  CO2 24 24 25 25 26   GLUCOSE 120* 118* 109* 111* 99  BUN 17 17 15 16 17   CREATININE 0.65 0.66 0.48 0.48 0.54  CALCIUM 8.6* 8.1* 7.7* 7.4* 7.4*  MG  --   --  2.1  --  2.4   Liver Function Tests: Recent Labs  Lab 09/28/24 1640  AST 28  ALT 16  ALKPHOS 146*  BILITOT 0.6  PROT 6.6  ALBUMIN 3.8   CBG: No results for input(s): GLUCAP in the last 168 hours.  Discharge time spent: greater than 30 minutes.  Signed: Adriana DELENA Grams, MD [facility] 10/05/2024

## 2024-10-06 ENCOUNTER — Non-Acute Institutional Stay (SKILLED_NURSING_FACILITY): Admitting: Adult Health

## 2024-10-06 ENCOUNTER — Encounter: Payer: Self-pay | Admitting: Physician Assistant

## 2024-10-06 ENCOUNTER — Encounter: Payer: Self-pay | Admitting: Adult Health

## 2024-10-06 DIAGNOSIS — D62 Acute posthemorrhagic anemia: Secondary | ICD-10-CM | POA: Insufficient documentation

## 2024-10-06 DIAGNOSIS — I10 Essential (primary) hypertension: Secondary | ICD-10-CM

## 2024-10-06 DIAGNOSIS — I4892 Unspecified atrial flutter: Secondary | ICD-10-CM

## 2024-10-06 DIAGNOSIS — E039 Hypothyroidism, unspecified: Secondary | ICD-10-CM | POA: Diagnosis not present

## 2024-10-06 DIAGNOSIS — J3089 Other allergic rhinitis: Secondary | ICD-10-CM | POA: Insufficient documentation

## 2024-10-06 DIAGNOSIS — D45 Polycythemia vera: Secondary | ICD-10-CM | POA: Diagnosis not present

## 2024-10-06 DIAGNOSIS — D72829 Elevated white blood cell count, unspecified: Secondary | ICD-10-CM | POA: Insufficient documentation

## 2024-10-06 DIAGNOSIS — S72352D Displaced comminuted fracture of shaft of left femur, subsequent encounter for closed fracture with routine healing: Secondary | ICD-10-CM

## 2024-10-06 DIAGNOSIS — I4891 Unspecified atrial fibrillation: Secondary | ICD-10-CM | POA: Insufficient documentation

## 2024-10-06 NOTE — Progress Notes (Signed)
 Location:  [facility] Room Number: 132 P Place of Service:  SNF (31)   CODE STATUS: Full Code  Allergies  Allergen Reactions   Codeine Nausea And Vomiting   Diovan [Valsartan] Itching and Rash   Lamisil [Terbinafine] Swelling   Penicillins Rash and Other (See Comments)    Breaking out in whelps     Chief Complaint  Patient presents with   Hospitalization Follow-up    HPI:  She is a 88 year old woman who has been hospitalized from 09-28-24 through 10-05-24. Her past medical history includes: hypertension; polycythemia; hypothyroidism. She had been getting out of her car twisted her left knee and fell. She suffered a left femur fracture and had a IM nail on 09-30-24. She has a history of atrial flutter, developed atrial fibrillation with RVR. She was stabilized on cardizem  drip and was switched to oral dosing at 240 mg daily. Is also on metoprolol. She was started on eliquis. She has developed anemia with hgb upon discharge at 8.5. her iron level is low; is no appropriate for iron supplementation. She is here for short term rehab with her goal to return back home. She will continue to be followed for her chronic illnesses including: Polycythemia vera: Atrial flutter unspecified/atrial fibrillation with RVR: Acquired hypothyroidism  Past Medical History:  Diagnosis Date   Hypertension    Influenza 11/2012   Osteoporosis    Polycythemia    Polycythemia vera(238.4) 02/25/2013   Diagnosed August 2005 on Hydrea  500 mg 3 times a week with excellent control.    Prolapse, uterus, congenital    ring placement    Past Surgical History:  Procedure Laterality Date   APPENDECTOMY     COLONOSCOPY     FEMUR IM NAIL Left 09/30/2024   Procedure: INSERTION, INTRAMEDULLARY ROD, FEMUR, RETROGRADE;  Surgeon: Onesimo Oneil LABOR, MD;  Location: [facility];  Service: Orthopedics;  Laterality: Left;   INCISION AND DRAINAGE Left 01/21/2022   Procedure: INCISION AND DRAINAGE LEFT  INDEX FINGER;  Surgeon: Murrell Drivers, MD;  Location: [facility];  Service: Orthopedics;  Laterality: Left;   OOPHORECTOMY     left   opperectomy     ORIF RADIAL FRACTURE Left 12/27/2022   Procedure: OPEN REDUCTION INTERNAL FIXATION (ORIF) RADIAL and ULNAR FRACTURE;  Surgeon: Margrette Taft BRAVO, MD;  Location: [facility];  Service: Orthopedics;  Laterality: Left;   REVISION TOTAL HIP ARTHROPLASTY Left     Social History   Socioeconomic History   Marital status: Divorced    Spouse name: Not on file   Number of children: 1   Years of education: Not on file   Highest education level: 11th grade  Occupational History   Not on file  Tobacco Use   Smoking status: Never   Smokeless tobacco: Never  Vaping Use   Vaping status: Never Used  Substance and Sexual Activity   Alcohol use: No   Drug use: No   Sexual activity: Yes    Birth control/protection: Post-menopausal  Other Topics Concern   Not on file  Social History Narrative   Not on file   Social Drivers of Health   Financial Resource Strain: Not on file  Food Insecurity: No Food Insecurity (09/28/2024)   Hunger Vital Sign    Worried About Running Out of Food in the Last Year: Never true    Ran Out of Food in the Last Year: Never true  Transportation Needs: No Transportation Needs (09/28/2024)   PRAPARE -  Administrator, Civil Service (Medical): No    Lack of Transportation (Non-Medical): No  Physical Activity: Insufficiently Active (08/23/2024)   Exercise Vital Sign    Days of Exercise per Week: 4 days    Minutes of Exercise per Session: 30 min  Stress: Stress Concern Present (08/23/2024)   [employer] of Occupational Health - Occupational Stress Questionnaire    Feeling of Stress: To some extent  Social Connections: Socially Isolated (09/28/2024)   Social Connection and Isolation Panel    Frequency of Communication with Friends and Family: More than three times a week    Frequency of Social  Gatherings with Friends and Family: More than three times a week    Attends Religious Services: Never    Database administrator or Organizations: No    Attends Banker Meetings: Never    Marital Status: Divorced  Catering manager Violence: Not At Risk (09/28/2024)   Humiliation, Afraid, Rape, and Kick questionnaire    Fear of Current or Ex-Partner: No    Emotionally Abused: No    Physically Abused: No    Sexually Abused: No   Family History  Problem Relation Age of Onset   Cancer Brother        throat cancer   Cancer Brother        lung cancer   Cancer Brother        prostate cancer      VITAL SIGNS BP (!) 111/44   Pulse 60   Temp (!) 97.2 F (36.2 C)   Resp 20   Ht 5' 1 (1.549 m)   Wt 109 lb 9.6 oz (49.7 kg)   SpO2 96%   BMI 20.71 kg/m   Outpatient Encounter Medications as of 10/06/2024  Medication Sig   acetaminophen  (TYLENOL ) 325 MG tablet Take 2 tablets (650 mg total) by mouth every 6 (six) hours as needed for mild pain (pain score 1-3) or fever (or Fever >/= 101).   apixaban (ELIQUIS) 2.5 MG TABS tablet Take 1 tablet (2.5 mg total) by mouth 2 (two) times daily.   diltiazem  (CARDIZEM  CD) 240 MG 24 hr capsule Take 1 capsule (240 mg total) by mouth daily.   hydroxyurea  (HYDREA ) 500 MG capsule May take with food to minimize GI side effects on Monday - Friday only   levofloxacin (LEVAQUIN) 500 MG tablet Take 1 tablet (500 mg total) by mouth daily for 5 days.   levothyroxine  (SYNTHROID ) 50 MCG tablet Take 1 tablet (50 mcg total) by mouth daily before breakfast.   loratadine (CLARITIN) 10 MG tablet Take 10 mg by mouth daily as needed for allergies.   metoprolol tartrate (LOPRESSOR) 25 MG tablet Take 1 tablet (25 mg total) by mouth 2 (two) times daily.   mometasone (NASONEX) 50 MCG/ACT nasal spray Place 2 sprays into the nose daily as needed (congestion).   polyethylene glycol (MIRALAX / GLYCOLAX) 17 g packet Take 17 g by mouth daily.   senna-docusate  (SENOKOT-S) 8.6-50 MG tablet Take 1 tablet by mouth at bedtime.   Vitamin D , Ergocalciferol , (DRISDOL ) 1.25 MG (50000 UNIT) CAPS capsule Take 1 capsule (50,000 Units total) by mouth every 14 (fourteen) days.   [DISCONTINUED] cetirizine (ZYRTEC) 10 MG tablet Take 10 mg by mouth daily as needed for allergies.   No facility-administered encounter medications on file as of 10/06/2024.     SIGNIFICANT DIAGNOSTIC EXAMS  LABS  08-23-24: tsh 1.970; free t4: 1.47 09-28-24: wbc 11.0; hgb 11.3; hct 36.4; mcv  84.5 plt 196; glucose 120; bun 17; creat 0.65; k+ 4.0; na++ 136; ca 8.6; gfr >60; protein 6.6 albumin 3.8; alk phos 146 10-05-24: wbc 22.0; hgb 8.5; hct 27.3; mcv 82.7 plt 273; iron 25; tibc 192  Review of Systems  Constitutional:  Negative for malaise/fatigue.  Respiratory:  Negative for cough and shortness of breath.   Cardiovascular:  Negative for chest pain, palpitations and leg swelling.  Gastrointestinal:  Negative for abdominal pain, constipation and heartburn.  Musculoskeletal:  Negative for back pain, joint pain and myalgias.  Skin: Negative.   Neurological:  Negative for dizziness.  Psychiatric/Behavioral:  The patient is not nervous/anxious.     Physical Exam Constitutional:      General: She is not in acute distress.    Appearance: She is well-developed. She is not diaphoretic.     Comments: thin  Neck:     Thyroid : No thyromegaly.  Cardiovascular:     Rate and Rhythm: Normal rate and regular rhythm.     Heart sounds: Normal heart sounds.  Pulmonary:     Effort: Pulmonary effort is normal. No respiratory distress.     Breath sounds: Normal breath sounds.  Abdominal:     General: Bowel sounds are normal. There is no distension.     Palpations: Abdomen is soft.     Tenderness: There is no abdominal tenderness.  Musculoskeletal:     Cervical back: Neck supple.     Right lower leg: No edema.     Left lower leg: No edema.     Comments: Left knee immobilizer in  place Status post left femur fracture   Lymphadenopathy:     Cervical: No cervical adenopathy.  Skin:    General: Skin is warm and dry.     Comments: Incision line without signs of infection  Neurological:     Mental Status: She is alert and oriented to person, place, and time.  Psychiatric:        Mood and Affect: Mood normal.     ASSESSMENT/ PLAN:  TODAY  Closed displaced comminuted  fracture of shaft of left femur with routine healing subsequent encounter: will continue therapy as directed and will follow up with orthopedics. She denies having any pain since her surgery.   2. Polycythemia vera: is on hydroxyurea  500 mg Monday through Friday  3. Atrial flutter unspecified/atrial fibrillation with RVR: is bradycardic today  will continue eliquis 2.5 mg twice daily; will continue cardizem  cd 240 mg daily for rate control. Will continue lopressor 25 mg twice daily and will hold for pulse <60.   4. Acquired hypothyroidism: tsh 1.970 will continue synthroid  50 mcg daily   5. Primary hypertension b/p 111/44 is on lopressor 25 mg twice daily   6. Chronic non-seasonal allergic rhinitis: will continue claritin 10 mg daily as needed; and mometasone 2 sprays daily as needed  7. Leukocytosis: will continue levaquin 500 mg daily through 10-10-24  8. Acute postoperative anemia due to expected blood loss: hgb 8.2; will repeat labs.   9. Vitamin D  deficiency: will continue 50,000 every 14 days.   10. Chronic constipation: will continue miralax daily and senna s nightly    Barnie Seip NP [facility]  call [phone]

## 2024-10-07 ENCOUNTER — Encounter: Payer: Self-pay | Admitting: Internal Medicine

## 2024-10-07 ENCOUNTER — Non-Acute Institutional Stay (SKILLED_NURSING_FACILITY): Payer: Self-pay | Admitting: Internal Medicine

## 2024-10-07 DIAGNOSIS — I4891 Unspecified atrial fibrillation: Secondary | ICD-10-CM | POA: Diagnosis not present

## 2024-10-07 DIAGNOSIS — D62 Acute posthemorrhagic anemia: Secondary | ICD-10-CM

## 2024-10-07 DIAGNOSIS — S72352D Displaced comminuted fracture of shaft of left femur, subsequent encounter for closed fracture with routine healing: Secondary | ICD-10-CM

## 2024-10-07 DIAGNOSIS — D72828 Other elevated white blood cell count: Secondary | ICD-10-CM

## 2024-10-07 DIAGNOSIS — E039 Hypothyroidism, unspecified: Secondary | ICD-10-CM

## 2024-10-07 NOTE — Progress Notes (Signed)
 NURSING HOME LOCATION:  [facility] ROOM NUMBER: 132P  CODE STATUS: Full code  PCP: Charmaine Grooms, PA-C  This is a comprehensive admission note to this SNF performed on this date less than 30 days from date of admission. Included are preadmission medical/surgical history; reconciled medication list; family history; social history and comprehensive review of systems.  Corrections and additions to the records were documented. Comprehensive physical exam was also performed. Additionally a clinical summary was entered for each active diagnosis pertinent to this admission in the Problem List to enhance continuity of care.  HPI: She was hospitalized 10/14 - 10/05/2024 with closed displaced comminuted fracture of left femur shaft resulting from mechanical l fall.  Imaging revealed acute comminuted distal femoral diaphyseal fracture with mild apex anterior angulation.  Surgical fixation of the distal femur was completed 10/16 by Dr Onesimo.    Weightbearing as tolerated and knee immobilization was prescribed. Course was complicated by A-fib with RVR necessitating Cardizem  drip with subsequent transition to diltiazem  240 mg daily plus metoprolol.  Eliquis initiated for CHA2DS2-VASc score >3. Preop H/H was 11.3/ 36.4 with nadir values of 8.5/27.3.  Iron panel revealed iron level 25 with normals of 28-170. Hydroxyurea  was continued for polycythemia vera.  White count was 11,000 at admission with a peak value of 22,700.  Final value was 22,000.  Leukocytosis was felt to be reactive as there were no signs or symptoms of acute infection.  Empirically she received Levaquin p.o. x 5 days. PT/OT consulted and recommended SNF placement for rehab.  [facility] follow-up is scheduled for November; she was last seen by Delon Hope, NP on 05/06/2024.  Leukocytosis has not been an issue.  Repeat CBC is scheduled here at the [facility] 10/27.  Past medical and surgical history: Includes essential  hypertension; osteoporosis; polycythemia vera; hypothyroidism; vitamin D  deficiency;  and GAD. Surgeries and procedures include colonoscopy; oophorectomy; ORIF radial fracture; pessary insertion ;and revision total hip arthroplasty.  Family history: reviewed, non contributory due to advanced age.  Social history: Non-smoker, nondrinker.  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   Review of systems: She is an excellent historian and very knowledgeable about her various health issues.  She denies any neuro or cardiac prodrome prior to the fall.  She states that this is the fourth fracture that she sustained.  The others included hip, LUE, and pelvis.  She has had DEXA studies and had previously been on Fosamax for a long time.  Despite leukocytosis; she denies any infectious symptoms.  She states that she has been followed at the [facility] for 20 years and is seen every 6 months.  She states that intermittently her platelets will drop.  Constitutional: No fever, significant weight change  Eyes: No redness, discharge, pain, vision change ENT/mouth: No nasal congestion, purulent discharge, earache, change in hearing, sore throat  Cardiovascular: No chest pain, palpitations, paroxysmal nocturnal dyspnea, claudication, edema  Respiratory: No cough, sputum production, hemoptysis, DOE, significant snoring, apnea Gastrointestinal: No heartburn, dysphagia, abdominal pain, nausea /vomiting, rectal bleeding, melena, change in bowels Genitourinary: No dysuria, hematuria, pyuria, incontinence, nocturia Dermatologic: No rash, pruritus, change in appearance of skin Neurologic: No dizziness, headache, syncope, seizures, numbness, tingling Psychiatric: No significant anxiety, depression, insomnia, anorexia Endocrine: No change in hair/skin/nails, excessive thirst, excessive hunger, excessive urination  Hematologic/lymphatic: No  significant bruising, lymphadenopathy, abnormal bleeding Allergy/immunology: No itchy/watery eyes, significant sneezing, urticaria, angioedema  Physical  exam:  Pertinent or positive findings: She appears her age and suboptimally nourished.  Intermittently there is a suggestion of a subtle head tremor.  Facies are somewhat weathered.  Eyebrows are absent.  She is not wearing her upper plate.  There is suggestion of slight mandibular macrognathia.  The mandibular teeth are misaligned and there is isolated mandibular dental loss.  There is splitting of the first heart sound and slight accentuation of the second heart sound.  Dorsalis pedis pulses are stronger than posterior tibial pulses.  She has trace edema at the sock line.  She is wearing a brace over the left lower extremity.  There is dramatic limb atrophy and also interosseous wasting.  General appearance: no acute distress, increased work of breathing is present.   Lymphatic: No lymphadenopathy about the head, neck, axilla. Eyes: No conjunctival inflammation or lid edema is present. There is no scleral icterus. Ears:  External ear exam shows no significant lesions or deformities.   Nose:  External nasal examination shows no deformity or inflammation. Nasal mucosa are pink and moist without lesions, exudates Oral exam:There is no oropharyngeal erythema or exudate. Neck:  No thyromegaly, masses, tenderness noted.    Heart:  Normal rate and regular rhythm without gallop, murmur, click, rub.  Lungs: Chest clear to auscultation without wheezes, rhonchi, rales, rubs. Abdomen: Bowel sounds are normal.  Abdomen is soft and nontender with no organomegaly, hernias, masses. GU: Deferred  Extremities:  No cyanosis, clubbing. Neurologic exam: Balance, Rhomberg, finger to nose testing could not be completed due to clinical state Skin: Warm & dry w/o tenting. No significant lesions or rash.  See clinical summary under each active problem in the Problem  List with associated updated therapeutic plan :  Acute postoperative anemia due to expected blood loss Preop H/H 11.3/36.4 with nadir values of 8.5/27.3.  Iron level was 25 with normals of 28-170.  This is in the context of a history of polycythemia vera for which she is on hydroxyurea .  Closed displaced comminuted fracture of shaft of left femur (HCC) PT/OT and SNF as tolerated.  Orthopedic follow-up as scheduled.  Hypothyroid In the context of AF with RVR; current TSH is therapeutic.  No change in L-thyroxine dose indicated.  Leukocytosis Currently afebrile with no infectious symptoms.  Continue to monitor.  Recheck CBC 10/27.  Hematology consultation if leukocytosis persistent.  Atrial fibrillation with RVR (HCC) Rhythm essentially regular; rate well-controlled. Continue CCB, BB & Eliquis.

## 2024-10-07 NOTE — Assessment & Plan Note (Signed)
 Preop H/H 11.3/36.4 with nadir values of 8.5/27.3.  Iron level was 25 with normals of 28-170.  This is in the context of a history of polycythemia vera for which she is on hydroxyurea .

## 2024-10-07 NOTE — Assessment & Plan Note (Signed)
 In the context of AF with RVR; current TSH is therapeutic.  No change in L-thyroxine dose indicated.

## 2024-10-07 NOTE — Assessment & Plan Note (Signed)
 PT/OT and SNF as tolerated.  Orthopedic follow-up as scheduled.

## 2024-10-07 NOTE — Assessment & Plan Note (Addendum)
 Currently afebrile with no infectious symptoms.  Continue to monitor.  Recheck CBC 10/27.  Hematology consultation if leukocytosis persistent.

## 2024-10-07 NOTE — Patient Instructions (Signed)
 See assessment and plan under each diagnosis in the problem list and acutely for this visit

## 2024-10-07 NOTE — Assessment & Plan Note (Addendum)
 Rhythm essentially regular; rate well-controlled. Continue CCB, BB & Eliquis.

## 2024-10-11 ENCOUNTER — Other Ambulatory Visit (HOSPITAL_COMMUNITY)
Admission: RE | Admit: 2024-10-11 | Discharge: 2024-10-11 | Disposition: A | Discharge status: Home / Self Care | Source: Skilled Nursing Facility | Attending: Adult Health | Admitting: Adult Health

## 2024-10-11 LAB — CBC
HCT: 28.6 % — ABNORMAL LOW (ref 36.0–46.0)
Hemoglobin: 8.6 g/dL — ABNORMAL LOW (ref 12.0–15.0)
MCH: 25.7 pg — ABNORMAL LOW (ref 26.0–34.0)
MCHC: 30.1 g/dL (ref 30.0–36.0)
MCV: 85.4 fL (ref 80.0–100.0)
Platelets: 250 K/uL (ref 150–400)
RBC: 3.35 MIL/uL — ABNORMAL LOW (ref 3.87–5.11)
RDW: 21.8 % — ABNORMAL HIGH (ref 11.5–15.5)
WBC: 11.1 K/uL — ABNORMAL HIGH (ref 4.0–10.5)
nRBC: 0 % (ref 0.0–0.2)

## 2024-10-12 ENCOUNTER — Other Ambulatory Visit: Payer: Self-pay | Admitting: *Deleted

## 2024-10-12 ENCOUNTER — Encounter: Payer: Self-pay | Admitting: Internal Medicine

## 2024-10-12 ENCOUNTER — Encounter: Payer: Self-pay | Admitting: Oncology

## 2024-10-12 DIAGNOSIS — F0390 Unspecified dementia without behavioral disturbance: Secondary | ICD-10-CM | POA: Insufficient documentation

## 2024-10-12 NOTE — Telephone Encounter (Signed)
**Note De-identified  [place] Obfuscation** Please advise 

## 2024-10-12 NOTE — Telephone Encounter (Signed)
 She comes next month and I will add to her labs.

## 2024-10-12 NOTE — Telephone Encounter (Signed)
 Hey Tomi, as far as her hydroxyurea .  Her platelets are fine.  No adjustment needed.  Just continue hydroxyurea  1 tablet Monday through Friday.  Does she have follow-up with us  soon?  She is slightly more anemic than usual.  I would like to add on iron levels and B12 levels if they are not already put in for her follow-up.  Delon Hope, NP 10/12/2024 12:30 PM

## 2024-10-13 ENCOUNTER — Encounter: Payer: Self-pay | Admitting: Orthopedic Surgery

## 2024-10-13 ENCOUNTER — Other Ambulatory Visit (INDEPENDENT_AMBULATORY_CARE_PROVIDER_SITE_OTHER): Payer: Self-pay

## 2024-10-13 ENCOUNTER — Ambulatory Visit (INDEPENDENT_AMBULATORY_CARE_PROVIDER_SITE_OTHER): Admitting: Orthopedic Surgery

## 2024-10-13 DIAGNOSIS — S72492D Other fracture of lower end of left femur, subsequent encounter for closed fracture with routine healing: Secondary | ICD-10-CM | POA: Diagnosis not present

## 2024-10-13 NOTE — Progress Notes (Signed)
 Orthopaedic Postop Note  Assessment: [name] is a 88 y.o. female s/p retrograde nail for left comminuted distal femoral shaft fracture  DOS: 09/30/2024  Plan: Sutures trimmed, Steri-Strips placed Radiographs stable Continue with weightbearing as tolerated, while wearing the knee immobilizer Initiate range of motion of the left knee. Range motion as tolerated Medications as needed Follow-up in 1 month   Follow-up: Return in about 4 weeks (around 11/10/2024). XR at next visit: Left femur  Subjective:  Chief Complaint  Patient presents with   Post-op Follow-up    Distal femur fracture retrograde IM nail on 09/30/24    History of Present Illness: [name] is a 88 y.o. female who presents following the above stated procedure.  She has done well.  She remains at the [facility].  Pain is controlled.  No issues with her incisions.  She is ambulating well, wearing the brace.  She has not initiated range of motion yet.  Review of Systems: No fevers or chills No numbness or tingling No Chest Pain No shortness of breath   Objective: There were no vitals taken for this visit.  Physical Exam:  Elderly female.  Seated on the stretcher  Alert and oriented.  No acute distress.  Surgical incisions are healing.  No surrounding erythema or drainage.  No tenderness to palpation within the distal femur.  She tolerates range of motion to approximately 75 degrees.  Toes warm and well-perfused.  Sensation intact distally.  IMAGING: I personally ordered and reviewed the following images:   X-rays of the left femur were obtained in clinic today.  These are compared to prior x-rays.  No change in position or alignment of the left hip hemiarthroplasty.  Comminuted distal femur fracture remains in stable alignment.  There has been no change in overall alignment.  Hardware is intact.  Screws and not backing out.  Impression: Stable left comminuted femoral shaft fracture,  without hardware failure  Oneil DELENA Horde, MD 10/13/2024 3:25 PM

## 2024-10-13 NOTE — Progress Notes (Signed)
 Patient Active Problem List   Diagnosis Date Noted   Dementia (HCC) 10/12/2024   Chronic non-seasonal allergic rhinitis 10/06/2024   Leukocytosis 10/06/2024   Acute postoperative anemia due to expected blood loss 10/06/2024   Atrial fibrillation with RVR (HCC) 10/06/2024   Atrial flutter (HCC) 10/02/2024   Closed displaced comminuted fracture of shaft of left femur (HCC) 09/28/2024   Vitamin D  deficiency 08/23/2024   Hypothyroid 08/23/2024   Primary hypertension 08/23/2024   Generalized anxiety disorder with panic attacks 08/23/2024   H/O total hip arthroplasty, left 10/01/2018   Uterovaginal prolapse, complete 10/12/2013   Polycythemia vera (HCC) 02/25/2013

## 2024-10-18 ENCOUNTER — Encounter: Payer: Self-pay | Admitting: Radiology

## 2024-10-19 ENCOUNTER — Encounter: Payer: Self-pay | Admitting: Orthopedic Surgery

## 2024-10-19 ENCOUNTER — Other Ambulatory Visit: Payer: Self-pay

## 2024-10-19 ENCOUNTER — Telehealth: Payer: Self-pay

## 2024-10-19 DIAGNOSIS — S52202D Unspecified fracture of shaft of left ulna, subsequent encounter for closed fracture with routine healing: Secondary | ICD-10-CM

## 2024-10-19 DIAGNOSIS — S72492D Other fracture of lower end of left femur, subsequent encounter for closed fracture with routine healing: Secondary | ICD-10-CM

## 2024-10-19 NOTE — Telephone Encounter (Signed)
 Called to say order has been placed, told by pt daughter pt has not been released yet and is set to be released as of right now but an extension has been requested.

## 2024-10-19 NOTE — Telephone Encounter (Signed)
 Her insurance company is booting her out of the facility and told her daughter that if you would do an order for her to have a in home health aide when she is released to go home.   She is still in the wheelchair and only out of it is when she is doing PT or sleeping in bed.  Any questions please call Holley (daughter) at [phone].

## 2024-10-20 ENCOUNTER — Other Ambulatory Visit: Payer: Self-pay | Admitting: Adult Health

## 2024-10-20 ENCOUNTER — Encounter: Payer: Self-pay | Admitting: Adult Health

## 2024-10-20 ENCOUNTER — Non-Acute Institutional Stay (SKILLED_NURSING_FACILITY): Admitting: Adult Health

## 2024-10-20 DIAGNOSIS — D45 Polycythemia vera: Secondary | ICD-10-CM

## 2024-10-20 DIAGNOSIS — I10 Essential (primary) hypertension: Secondary | ICD-10-CM

## 2024-10-20 DIAGNOSIS — I4891 Unspecified atrial fibrillation: Secondary | ICD-10-CM | POA: Diagnosis not present

## 2024-10-20 DIAGNOSIS — S72352D Displaced comminuted fracture of shaft of left femur, subsequent encounter for closed fracture with routine healing: Secondary | ICD-10-CM | POA: Diagnosis not present

## 2024-10-20 DIAGNOSIS — D649 Anemia, unspecified: Secondary | ICD-10-CM

## 2024-10-20 DIAGNOSIS — E559 Vitamin D deficiency, unspecified: Secondary | ICD-10-CM

## 2024-10-20 MED ORDER — LEVOTHYROXINE SODIUM 50 MCG PO TABS: 50.0000 ug | ORAL_TABLET | Freq: Every day | ORAL | 0 refills | Status: DC

## 2024-10-20 MED ORDER — HYDROXYUREA 500 MG PO CAPS: ORAL_CAPSULE | ORAL | 0 refills | Status: DC

## 2024-10-20 MED ORDER — APIXABAN 2.5 MG PO TABS: 2.5000 mg | ORAL_TABLET | Freq: Two times a day (BID) | ORAL | 0 refills | Status: DC

## 2024-10-20 MED ORDER — DILTIAZEM HCL ER COATED BEADS 240 MG PO CP24: 240.0000 mg | ORAL_CAPSULE | Freq: Every day | ORAL | 0 refills | Status: AC

## 2024-10-20 MED ORDER — FLUTICASONE PROPIONATE 50 MCG/ACT NA SUSP: 2.0000 | Freq: Every day | NASAL | 0 refills | Status: DC

## 2024-10-20 MED ORDER — VITAMIN D (ERGOCALCIFEROL) 1.25 MG (50000 UNIT) PO CAPS: 50000.0000 [IU] | ORAL_CAPSULE | ORAL | 0 refills | Status: DC

## 2024-10-20 MED ORDER — METOPROLOL TARTRATE 25 MG PO TABS: 25.0000 mg | ORAL_TABLET | Freq: Two times a day (BID) | ORAL | 0 refills | Status: DC

## 2024-10-20 NOTE — Progress Notes (Signed)
 Location:  [facility] Room Number: 132 P Place of Service:  SNF (31)   CODE STATUS: Full Code  Allergies  Allergen Reactions   Codeine Nausea And Vomiting   Diovan [Valsartan] Itching and Rash   Lamisil [Terbinafine] Swelling   Penicillins Rash and Other (See Comments)    Breaking out in whelps     Chief Complaint  Patient presents with   Discharge Note    HPI:  She is being discharged to home with home health for pt/ot. Will need a rolling walker; will need prescriptions written and will need to follow up with pcp. She suffered a left femur fracture and had a IM nail on 09-30-24. She has a history of atrial flutter, developed atrial fibrillation with RVR. She was stabilized on cardizem  drip and was switched to oral dosing at 240 mg daily. Is also on metoprolol. She was started on eliquis. She developed anemia with hgb upon discharge at 8.5. her iron level is low; is not appropriate for iron supplementation. She was admitted to this facility for short term rehab. Therapy: ambulate 150 feet with rolling walker at supervision. Upper body is supervision; lower body contact guard. Brp: contact guard to min assist.   Past Medical History:  Diagnosis Date   Hypertension    Influenza 11/2012   Osteoporosis    Polycythemia    Polycythemia vera(238.4) 02/25/2013   Diagnosed August 2005 on Hydrea  500 mg 3 times a week with excellent control.    Prolapse, uterus, congenital    ring placement    Past Surgical History:  Procedure Laterality Date   APPENDECTOMY     COLONOSCOPY     FEMUR IM NAIL Left 09/30/2024   Procedure: INSERTION, INTRAMEDULLARY ROD, FEMUR, RETROGRADE;  Surgeon: Onesimo Oneil LABOR, MD;  Location: [facility];  Service: Orthopedics;  Laterality: Left;   INCISION AND DRAINAGE Left 01/21/2022   Procedure: INCISION AND DRAINAGE LEFT INDEX FINGER;  Surgeon: Murrell Drivers, MD;  Location: [facility];  Service: Orthopedics;  Laterality: Left;    OOPHORECTOMY     left   opperectomy     ORIF RADIAL FRACTURE Left 12/27/2022   Procedure: OPEN REDUCTION INTERNAL FIXATION (ORIF) RADIAL and ULNAR FRACTURE;  Surgeon: Margrette Taft BRAVO, MD;  Location: [facility];  Service: Orthopedics;  Laterality: Left;   REVISION TOTAL HIP ARTHROPLASTY Left     Social History   Socioeconomic History   Marital status: Divorced    Spouse name: Not on file   Number of children: 1   Years of education: Not on file   Highest education level: 11th grade  Occupational History   Not on file  Tobacco Use   Smoking status: Never   Smokeless tobacco: Never  Vaping Use   Vaping status: Never Used  Substance and Sexual Activity   Alcohol use: No   Drug use: No   Sexual activity: Yes    Birth control/protection: Post-menopausal  Other Topics Concern   Not on file  Social History Narrative   Not on file   Social Drivers of Health   Financial Resource Strain: Not on file  Food Insecurity: No Food Insecurity (09/28/2024)   Hunger Vital Sign    Worried About Running Out of Food in the Last Year: Never true    Ran Out of Food in the Last Year: Never true  Transportation Needs: No Transportation Needs (09/28/2024)   PRAPARE - Administrator, Civil Service (Medical): No  Lack of Transportation (Non-Medical): No  Physical Activity: Insufficiently Active (08/23/2024)   Exercise Vital Sign    Days of Exercise per Week: 4 days    Minutes of Exercise per Session: 30 min  Stress: Stress Concern Present (08/23/2024)   [employer] of Occupational Health - Occupational Stress Questionnaire    Feeling of Stress: To some extent  Social Connections: Socially Isolated (09/28/2024)   Social Connection and Isolation Panel    Frequency of Communication with Friends and Family: More than three times a week    Frequency of Social Gatherings with Friends and Family: More than three times a week    Attends Religious Services: Never    Doctor, General Practice or Organizations: No    Attends Banker Meetings: Never    Marital Status: Divorced  Catering Manager Violence: Not At Risk (09/28/2024)   Humiliation, Afraid, Rape, and Kick questionnaire    Fear of Current or Ex-Partner: No    Emotionally Abused: No    Physically Abused: No    Sexually Abused: No   Family History  Problem Relation Age of Onset   Cancer Brother        throat cancer   Cancer Brother        lung cancer   Cancer Brother        prostate cancer      VITAL SIGNS BP (!) 137/52   Pulse (!) 55   Temp 97.9 F (36.6 C)   Resp 18   Ht 5' 1 (1.549 m)   Wt 103 lb 9.6 oz (47 kg)   SpO2 99%   BMI 19.58 kg/m   Outpatient Encounter Medications as of 10/20/2024  Medication Sig   acetaminophen  (TYLENOL ) 325 MG tablet Take 2 tablets (650 mg total) by mouth every 6 (six) hours as needed for mild pain (pain score 1-3) or fever (or Fever >/= 101).   apixaban (ELIQUIS) 2.5 MG TABS tablet Take 1 tablet (2.5 mg total) by mouth 2 (two) times daily.   diltiazem  (CARDIZEM  CD) 240 MG 24 hr capsule Take 1 capsule (240 mg total) by mouth daily.   fluticasone (FLONASE) 50 MCG/ACT nasal spray Place 2 sprays into both nostrils daily.   hydroxyurea  (HYDREA ) 500 MG capsule May take with food to minimize GI side effects on Monday - Friday only   levothyroxine  (SYNTHROID ) 50 MCG tablet Take 1 tablet (50 mcg total) by mouth daily before breakfast.   loratadine (CLARITIN) 10 MG tablet Take 10 mg by mouth daily as needed for allergies.   metoprolol tartrate (LOPRESSOR) 25 MG tablet Take 1 tablet (25 mg total) by mouth 2 (two) times daily.   polyethylene glycol (MIRALAX / GLYCOLAX) 17 g packet Take 17 g by mouth daily.   senna-docusate (SENOKOT-S) 8.6-50 MG tablet Take 1 tablet by mouth at bedtime.   Vitamin D , Ergocalciferol , (DRISDOL ) 1.25 MG (50000 UNIT) CAPS capsule Take 1 capsule (50,000 Units total) by mouth every 14 (fourteen) days.   mometasone (NASONEX) 50 MCG/ACT  nasal spray Place 2 sprays into the nose daily as needed (congestion). (Patient not taking: Reported on 10/20/2024)   No facility-administered encounter medications on file as of 10/20/2024.     SIGNIFICANT DIAGNOSTIC EXAMS  LABS  08-23-24: tsh 1.970; free t4: 1.47 09-28-24: wbc 11.0; hgb 11.3; hct 36.4; mcv 84.5 plt 196; glucose 120; bun 17; creat 0.65; k+ 4.0; na++ 136; ca 8.6; gfr >60; protein 6.6 albumin 3.8; alk phos 146 10-05-24: wbc 22.0; hgb  8.5; hct 27.3; mcv 82.7 plt 273; iron 25; tibc 192  Review of Systems  Constitutional:  Negative for malaise/fatigue.  Respiratory:  Negative for cough and shortness of breath.   Cardiovascular:  Negative for chest pain, palpitations and leg swelling.  Gastrointestinal:  Negative for abdominal pain, constipation and heartburn.  Musculoskeletal:  Negative for back pain, joint pain and myalgias.  Skin: Negative.   Neurological:  Negative for dizziness.  Psychiatric/Behavioral:  The patient is not nervous/anxious.     Physical Exam Constitutional:      General: She is not in acute distress.    Appearance: She is well-developed. She is not diaphoretic.     Comments: thin  Neck:     Thyroid : No thyromegaly.  Cardiovascular:     Rate and Rhythm: Normal rate and regular rhythm.     Heart sounds: Normal heart sounds.  Pulmonary:     Effort: Pulmonary effort is normal. No respiratory distress.     Breath sounds: Normal breath sounds.  Abdominal:     General: Bowel sounds are normal. There is no distension.     Palpations: Abdomen is soft.     Tenderness: There is no abdominal tenderness.  Musculoskeletal:     Cervical back: Neck supple.     Right lower leg: No edema.     Left lower leg: No edema.     Comments: Left knee immobilizer in place Status post left femur fracture    Lymphadenopathy:     Cervical: No cervical adenopathy.  Skin:    General: Skin is warm and dry.  Neurological:     Mental Status: She is alert. Mental status  is at baseline.  Psychiatric:        Mood and Affect: Mood normal.     ASSESSMENT/ PLAN:   Patient is being discharged with the following home health services: pt/ot to evaluate and treat as indicated for gait balance strength adl training   Patient is being discharged with the following durable medical equipment:  rolling walker (pt will get from [facility])  Patient has been advised to f/u with their PCP in 1-2 weeks to for a transitions of care visit.  Social services at their facility was responsible for arranging this appointment.  Pt was provided with adequate prescriptions of noncontrolled medications to reach the scheduled appointment .  For controlled substances, a limited supply was provided as appropriate for the individual patient.  If the pt normally receives these medications from a pain clinic or has a contract with another physician, these medications should be received from that clinic or physician only).    A 39 day supply of her prescription medications have been sent to [facility].   Time spent with patient: 35 minutes: medications; home health; dme.   Barnie Seip NP [facility]  call [phone]

## 2024-10-25 ENCOUNTER — Inpatient Hospital Stay: Payer: Self-pay | Admitting: Physician Assistant

## 2024-10-25 ENCOUNTER — Telehealth: Payer: Self-pay | Admitting: Orthopedic Surgery

## 2024-10-25 NOTE — Telephone Encounter (Signed)
 Dr. Onesimo pt GLENWOOD Raring PT w/[facility] [phone] lvm stating she needs weight bearing status on the left leg for this pt and will our office be willing to sign their [facility] PT orders.

## 2024-10-25 NOTE — Telephone Encounter (Signed)
 I called to advise WB as tolerated ROM as tolerated  Spoke to [place]

## 2024-10-26 ENCOUNTER — Telehealth: Payer: Self-pay

## 2024-10-26 ENCOUNTER — Encounter: Payer: Self-pay | Admitting: Orthopedic Surgery

## 2024-10-26 NOTE — Telephone Encounter (Signed)
 Patient's daughter came by the office and stated that the PT therapist called her and stated the patient's knee that she had surgery on is swollen, red and warm to the touch. Also stated that maybe it was becoming infected. I have scheduled her to come in tomorrow, but advised the daughter to think about taking her to the ER since it has the possible signs of infection. Daughter stated she would go by and check on her mom to see how it looks and go from there

## 2024-10-27 ENCOUNTER — Encounter: Admitting: Orthopedic Surgery

## 2024-10-27 MED ORDER — CEPHALEXIN 500 MG PO CAPS: 500.0000 mg | ORAL_CAPSULE | Freq: Two times a day (BID) | ORAL | 0 refills | Status: AC

## 2024-10-28 ENCOUNTER — Inpatient Hospital Stay

## 2024-10-28 ENCOUNTER — Ambulatory Visit (HOSPITAL_COMMUNITY): Admitting: Internal Medicine

## 2024-11-04 ENCOUNTER — Inpatient Hospital Stay: Admitting: Oncology

## 2024-11-04 ENCOUNTER — Encounter: Payer: Self-pay | Admitting: Orthopedic Surgery

## 2024-11-08 ENCOUNTER — Telehealth: Payer: Self-pay | Admitting: *Deleted

## 2024-11-08 NOTE — Telephone Encounter (Signed)
 [name]- daughter My mom now a patient there.SABRASABRAI realize you are not her primary..I am on file for you to speak with me ..I have known you for over 30 years now and I wouldn't bother you if not at my wits end her name [name]..so you can see her my chart..she broke femur at 18 mid Oct..had just started kinda bouncing back from broken pelvis in jan..the femur .SABRAnot bouncing back..was only at [facility] center for about 16 days.SABRAins  booted out...she has Medicare and medicaid..PT only coming in home 1x per week..only help I have and I work full time and you know my neck issues..is a cousin whom im most grateful but is disabled herself..mom can only get herself to and from bathroom and then not always in time..she desperately needs more in facility PT I reached out to her ortho they said primary needs put order in I have her on very long waiting list for in home assistance but list and process is super long.this is physically killing me and only help I have.it takes 2 or 3 of us  to get her in out of house or car..im getting down with my neck from this.SABRAshe will do in facility pt..but left to do at home .SABRAnot been doing and Is there any way an order could be done her go back into facility for at least 2-4 weeks hopefully..build her strength etc..I am literally at wits end and would never have bothered you with this otherwise. . but I am desperately asking for some type of help please..thank you for 30 years of caring for me and my family..I have no clue what I'll do when you ever retire. I actually ran out of space to type if you need to call me 24/7.SABRAI will answer. You have known me for 30 years now and I hope you know I wouldn't have bothered you unless I was desperate

## 2024-11-09 ENCOUNTER — Encounter: Payer: Self-pay | Admitting: Orthopedic Surgery

## 2024-11-10 ENCOUNTER — Encounter: Payer: Self-pay | Admitting: Orthopedic Surgery

## 2024-11-10 ENCOUNTER — Telehealth: Payer: Self-pay

## 2024-11-10 ENCOUNTER — Encounter: Admitting: Orthopedic Surgery

## 2024-11-10 NOTE — Telephone Encounter (Signed)
 Patient's daughter left vm msg stating to cancel her appointment for today. Patient says her feet has been swollen for the last couple of days and she can't put her shoes on. Daughter will call back to reschedule, but is asking for the doctor or nurse to please give her a call @  [phone]

## 2024-11-12 ENCOUNTER — Emergency Department (HOSPITAL_COMMUNITY)
Admission: EM | Admit: 2024-11-12 | Discharge: 2024-11-12 | Disposition: A | Discharge status: Home / Self Care | Attending: Emergency Medicine | Admitting: Emergency Medicine

## 2024-11-12 ENCOUNTER — Encounter (HOSPITAL_COMMUNITY): Payer: Self-pay

## 2024-11-12 ENCOUNTER — Other Ambulatory Visit: Payer: Self-pay

## 2024-11-12 ENCOUNTER — Emergency Department (HOSPITAL_COMMUNITY)

## 2024-11-12 ENCOUNTER — Encounter: Payer: Self-pay | Admitting: Physician Assistant

## 2024-11-12 DIAGNOSIS — R600 Localized edema: Secondary | ICD-10-CM | POA: Diagnosis not present

## 2024-11-12 DIAGNOSIS — S300XXA Contusion of lower back and pelvis, initial encounter: Secondary | ICD-10-CM | POA: Diagnosis not present

## 2024-11-12 DIAGNOSIS — R059 Cough, unspecified: Secondary | ICD-10-CM | POA: Diagnosis not present

## 2024-11-12 DIAGNOSIS — S3992XA Unspecified injury of lower back, initial encounter: Secondary | ICD-10-CM | POA: Diagnosis present

## 2024-11-12 DIAGNOSIS — Y92009 Unspecified place in unspecified non-institutional (private) residence as the place of occurrence of the external cause: Secondary | ICD-10-CM | POA: Insufficient documentation

## 2024-11-12 DIAGNOSIS — W19XXXA Unspecified fall, initial encounter: Secondary | ICD-10-CM | POA: Diagnosis not present

## 2024-11-12 DIAGNOSIS — Z7901 Long term (current) use of anticoagulants: Secondary | ICD-10-CM | POA: Diagnosis not present

## 2024-11-12 MED ORDER — MUPIROCIN 2 % EX OINT: 1.0000 | TOPICAL_OINTMENT | Freq: Two times a day (BID) | CUTANEOUS | 0 refills | Status: DC

## 2024-11-12 MED ORDER — BACITRACIN ZINC 500 UNIT/GM EX OINT: TOPICAL_OINTMENT | Freq: Two times a day (BID) | CUTANEOUS | Status: DC

## 2024-11-12 NOTE — Discharge Instructions (Addendum)
 The x-rays thankfully show no signs of pneumonia, you do have just a touch of fluid building up on your lungs which was the same thing that was seen in October.  The x-rays of your back show that you have very tiny abnormalities to the bone which could be consistent with very small fractures, there is nothing to do about these fractures as they do not require surgery.  I recommend keeping an antibiotic ointment on your wounds on your back, you can take Tylenol  up to 1000 mg 4 times a day as needed for pain  Thank you for allowing us  to treat you in the emergency department today.  After reviewing your examination and potential testing that was done it appears that you are safe to go home.  I would like for you to follow-up with your doctor within the next several days, have them obtain your records and follow-up with them to review all potential tests and results from your visit.  If you should develop severe or worsening symptoms return to the emergency department immediately

## 2024-11-12 NOTE — ED Provider Notes (Signed)
 [place] EMERGENCY DEPARTMENT AT [facility] Provider Note   CSN: [id number] Arrival date & time: 11/12/24  1656     Patient presents with: [place]   [name] is a 88 y.o. female.    Fall   This patient is an 88 year old female, she has recently been treated in the hospital after having a fracture of the left femur, she required surgery, she has a history of atrial fibrillation, she spent a couple of weeks in a rehab facility followed by going home, her daughter and her niece split time at home with her helping her out every single day.  Today she was tried to get to the bedside commode, she slipped and fell bumping her head on her walker, sliding to the ground, her lower back also hit the walker and caused an abrasion on her back.  At this time she has no headache no neck pain no back pain no chest pain, she does complain of a cough for couple of days but does not feel short of breath.  No numbness or weakness in the legs, states that her legs are not hurting at all, paramedics were called as the patient was having difficulty getting to her feet without assistance after surgery    Prior to Admission medications   Medication Sig Start Date End Date Taking? Authorizing Provider  mupirocin  ointment (BACTROBAN ) 2 % Apply 1 Application topically 2 (two) times daily. 11/12/24  Yes Cleotilde Rogue, MD  acetaminophen  (TYLENOL ) 325 MG tablet Take 2 tablets (650 mg total) by mouth every 6 (six) hours as needed for mild pain (pain score 1-3) or fever (or Fever >/= 101). 10/05/24   Shahmehdi, Adriana LABOR, MD  apixaban  (ELIQUIS ) 2.5 MG TABS tablet Take 1 tablet (2.5 mg total) by mouth 2 (two) times daily. 10/20/24   Landy Barnie GORMAN, NP  diltiazem  (CARDIZEM  CD) 240 MG 24 hr capsule Take 1 capsule (240 mg total) by mouth daily. 10/20/24 11/19/24  Landy Barnie GORMAN, NP  fluticasone  (FLONASE ) 50 MCG/ACT nasal spray Place 2 sprays into both nostrils daily. 10/20/24   Landy Barnie GORMAN, NP   hydroxyurea  (HYDREA ) 500 MG capsule May take with food to minimize GI side effects on Monday - Friday only 10/20/24   Landy Barnie GORMAN, NP  levothyroxine  (SYNTHROID ) 50 MCG tablet Take 1 tablet (50 mcg total) by mouth daily before breakfast. 10/20/24   Landy Barnie GORMAN, NP  loratadine  (CLARITIN ) 10 MG tablet Take 10 mg by mouth daily as needed for allergies.    [provider]  metoprolol  tartrate (LOPRESSOR ) 25 MG tablet Take 1 tablet (25 mg total) by mouth 2 (two) times daily. 10/20/24 01/18/25  Landy Barnie GORMAN, NP  polyethylene glycol (MIRALAX  / GLYCOLAX ) 17 g packet Take 17 g by mouth daily. 10/06/24   Shahmehdi, Adriana LABOR, MD  senna-docusate (SENOKOT-S) 8.6-50 MG tablet Take 1 tablet by mouth at bedtime. 10/05/24   Shahmehdi, Adriana LABOR, MD  Vitamin D , Ergocalciferol , (DRISDOL ) 1.25 MG (50000 UNIT) CAPS capsule Take 1 capsule (50,000 Units total) by mouth every 14 (fourteen) days. 10/20/24   Landy Barnie GORMAN, NP    Allergies: Codeine, Diovan [valsartan], Lamisil [terbinafine], and Penicillins    Review of Systems  All other systems reviewed and are negative.   Updated Vital Signs BP (!) 127/54   Pulse 65   Temp 98.5 F (36.9 C) (Oral)   Resp (!) 22   Ht 1.549 m (5' 1)   Wt 47 kg  SpO2 93%   BMI 19.58 kg/m   Physical Exam Vitals and nursing note reviewed.  Constitutional:      General: She is not in acute distress.    Appearance: She is well-developed.  HENT:     Head: Normocephalic and atraumatic.     Comments: No tenderness over the scalp of the face, no signs of head injury abrasion laceration or hematoma    Mouth/Throat:     Pharynx: No oropharyngeal exudate.  Eyes:     General: No scleral icterus.       Right eye: No discharge.        Left eye: No discharge.     Conjunctiva/sclera: Conjunctivae normal.     Pupils: Pupils are equal, round, and reactive to light.  Neck:     Thyroid : No thyromegaly.     Vascular: No JVD.  Cardiovascular:     Rate and Rhythm:  Normal rate and regular rhythm.     Heart sounds: Normal heart sounds. No murmur heard.    No friction rub. No gallop.  Pulmonary:     Effort: Pulmonary effort is normal. No respiratory distress.     Breath sounds: Normal breath sounds. No wheezing or rales.  Abdominal:     General: Bowel sounds are normal. There is no distension.     Palpations: Abdomen is soft. There is no mass.     Tenderness: There is no abdominal tenderness.  Musculoskeletal:        General: No tenderness. Normal range of motion.     Cervical back: Normal range of motion and neck supple.     Right lower leg: Edema present.     Left lower leg: Edema present.     Comments: The patient is able to straight leg raise bilaterally, she does have a slight asymmetry in her ability to do this secondary to pain, she has no pain with range of motion passively of the hip knee or ankle.  She has bilateral edema of the legs.  Upper extremities examined without deformities tenderness or other abnormalities.  Normal pulses at the radial arteries bilaterally.  There is mild tenderness over the lumbar spine where there is an abrasion, there is no tenderness over the neck  Lymphadenopathy:     Cervical: No cervical adenopathy.  Skin:    General: Skin is warm and dry.     Findings: No erythema or rash.  Neurological:     Mental Status: She is alert.     Coordination: Coordination normal.  Psychiatric:        Behavior: Behavior normal.     (all labs ordered are listed, but only abnormal results are displayed) Labs Reviewed - No data to display  EKG: None  Radiology: DG Lumbar Spine Complete Result Date: 11/12/2024 CLINICAL DATA:  Status post fall EXAM: LUMBAR SPINE - COMPLETE 5 VIEW COMPARISON:  Lumbar spine radiograph dated 02/30/2012 FINDINGS: Age indeterminate superior endplate compression of L1, L2, and L4. Unchanged T11 compression fracture. Levoscoliosis of the lumbar spine. No spondylolisthesis. Intervertebral disc spaces  are maintained. IMPRESSION: 1. Age indeterminate superior endplate compression of L1, L2, and L4. 2. Unchanged T11 compression fracture. 3. Lumbar levoscoliosis.  No spondylolisthesis. Electronically Signed   By: Limin  Xu M.D.   On: 11/12/2024 19:02   DG Chest 2 View Result Date: 11/12/2024 CLINICAL DATA:  Cough EXAM: CHEST - 2 VIEW COMPARISON:  Chest x-ray 10/04/2024 FINDINGS: There are small bilateral pleural effusions. There are atelectatic changes in the  lung bases. The heart is mildly enlarged. There is central pulmonary vascular congestion. No pneumothorax or acute fracture identified. IMPRESSION: Cardiomegaly with central pulmonary vascular congestion and small bilateral pleural effusions. Electronically Signed   By: Greig Pique M.D.   On: 11/12/2024 19:00     Procedures   Medications Ordered in the ED  bacitracin ointment (has no administration in time range)                                    Medical Decision Making Amount and/or Complexity of Data Reviewed Radiology: ordered.  Risk OTC drugs. Prescription drug management.    This patient presents to the ED for concern of fall, accidental, mechanical differential diagnosis includes possible spinal injury, there is no signs of significant head injury and she states she just bumped her head lightly against her walker, there is no loss of consciousness no headache and no visible signs of head injury. Patient has occasional cough during exam but not short of breath not hypoxic and not tachycardic    Additional history obtained   Additional history obtained from Electronic Medical Record External records from outside source obtained and reviewed including paramedics    Imaging Studies ordered:  I ordered imaging studies including x-ray of the lumbar spine and the chest I independently visualized and interpreted imaging which showed small bilateral pleural effusions with mild vascular congestion, not entirely  different from October, echocardiogram from that time was unremarkable as well Lumbar spine with some superior endplate abnormalities but no significant fractures otherwise I agree with the radiologist interpretation   Medicines ordered and prescription drug management:  I ordered medication including bacitracin and a sterile dressing to the wound    I have reviewed the patients home medicines and have made adjustments as needed   Problem List / ED Course:  Well-appearing, wounds dressed, stable for discharge   Social Determinants of Health:  None        Final diagnoses:  Lumbar contusion, initial encounter  Fall in home, initial encounter    ED Discharge Orders          Ordered    mupirocin ointment (BACTROBAN) 2 %  2 times daily        11/12/24 1920               Cleotilde Rogue, MD 11/12/24 1921

## 2024-11-12 NOTE — ED Triage Notes (Signed)
 Pt called [facility] and presented to ED for fall. Pt was trying to get up from using the bathroom and fell and slid down next to her Rolator. Pt + for blood thinners. Pt states she bumped her head on her walker. No visible bleeding or bruising noted on head. Pt states she's been dealing with a new cough since yesterday. Upon inspection, pt has an abrasion along her lumbar spine. Pt states she hit the walker on her way down.

## 2024-11-12 NOTE — ED Notes (Signed)
 [facility] [employer] called to pick up patient to bring home. ETA 30-40 minutes.

## 2024-11-15 ENCOUNTER — Inpatient Hospital Stay (HOSPITAL_COMMUNITY)
Admission: EM | Admit: 2024-11-15 | Discharge: 2024-11-19 | DRG: 193 | Disposition: A | Discharge status: Skilled Nursing Facility | Attending: Family Medicine | Admitting: Family Medicine

## 2024-11-15 ENCOUNTER — Emergency Department (HOSPITAL_COMMUNITY)

## 2024-11-15 ENCOUNTER — Encounter: Payer: Self-pay | Admitting: Oncology

## 2024-11-15 ENCOUNTER — Other Ambulatory Visit: Payer: Self-pay

## 2024-11-15 ENCOUNTER — Encounter (HOSPITAL_COMMUNITY): Payer: Self-pay

## 2024-11-15 DIAGNOSIS — J9601 Acute respiratory failure with hypoxia: Secondary | ICD-10-CM

## 2024-11-15 DIAGNOSIS — E039 Hypothyroidism, unspecified: Secondary | ICD-10-CM | POA: Diagnosis present

## 2024-11-15 DIAGNOSIS — D638 Anemia in other chronic diseases classified elsewhere: Secondary | ICD-10-CM | POA: Diagnosis present

## 2024-11-15 DIAGNOSIS — A499 Bacterial infection, unspecified: Secondary | ICD-10-CM

## 2024-11-15 DIAGNOSIS — D45 Polycythemia vera: Secondary | ICD-10-CM | POA: Diagnosis present

## 2024-11-15 DIAGNOSIS — R531 Weakness: Secondary | ICD-10-CM

## 2024-11-15 DIAGNOSIS — E559 Vitamin D deficiency, unspecified: Secondary | ICD-10-CM

## 2024-11-15 DIAGNOSIS — J189 Pneumonia, unspecified organism: Secondary | ICD-10-CM

## 2024-11-15 DIAGNOSIS — M545 Low back pain, unspecified: Principal | ICD-10-CM

## 2024-11-15 DIAGNOSIS — G8929 Other chronic pain: Principal | ICD-10-CM

## 2024-11-15 DIAGNOSIS — K219 Gastro-esophageal reflux disease without esophagitis: Secondary | ICD-10-CM

## 2024-11-15 DIAGNOSIS — D696 Thrombocytopenia, unspecified: Secondary | ICD-10-CM

## 2024-11-15 DIAGNOSIS — I10 Essential (primary) hypertension: Secondary | ICD-10-CM | POA: Diagnosis present

## 2024-11-15 DIAGNOSIS — I480 Paroxysmal atrial fibrillation: Secondary | ICD-10-CM

## 2024-11-15 LAB — COMPREHENSIVE METABOLIC PANEL WITH GFR
ALT: 7 U/L (ref 0–44)
AST: 34 U/L (ref 15–41)
Albumin: 2.8 g/dL — ABNORMAL LOW (ref 3.5–5.0)
Alkaline Phosphatase: 250 U/L — ABNORMAL HIGH (ref 38–126)
Anion gap: 11 (ref 5–15)
BUN: 15 mg/dL (ref 8–23)
CO2: 22 mmol/L (ref 22–32)
Calcium: 8 mg/dL — ABNORMAL LOW (ref 8.9–10.3)
Chloride: 101 mmol/L (ref 98–111)
Creatinine, Ser: 0.56 mg/dL (ref 0.44–1.00)
GFR, Estimated: >60 mL/min (ref >60)
Glucose, Bld: 110 mg/dL — ABNORMAL HIGH (ref 70–99)
Potassium: 3.8 mmol/L (ref 3.5–5.1)
Sodium: 134 mmol/L — ABNORMAL LOW (ref 135–145)
Total Bilirubin: 0.6 mg/dL (ref 0.0–1.2)
Total Protein: 5.7 g/dL — ABNORMAL LOW (ref 6.5–8.1)

## 2024-11-15 LAB — RETICULOCYTES
Immature Retic Fract: 18.3 % — ABNORMAL HIGH (ref 2.3–15.9)
RBC.: 3.33 MIL/uL — ABNORMAL LOW (ref 3.87–5.11)
Retic Count, Absolute: 30 K/uL (ref 19.0–186.0)
Retic Ct Pct: 0.9 % (ref 0.4–3.1)

## 2024-11-15 LAB — URINALYSIS, ROUTINE W REFLEX MICROSCOPIC
Bilirubin Urine: NEGATIVE
Glucose, UA: NEGATIVE mg/dL
Ketones, ur: NEGATIVE mg/dL
Nitrite: NEGATIVE
Protein, ur: NEGATIVE mg/dL
Specific Gravity, Urine: 1.017 (ref 1.005–1.030)
WBC, UA: >50 WBC/hpf (ref 0–5)
pH: 5 (ref 5.0–8.0)

## 2024-11-15 LAB — PROTIME-INR
INR: 1.4 — ABNORMAL HIGH (ref 0.8–1.2)
Prothrombin Time: 17.7 s — ABNORMAL HIGH (ref 11.4–15.2)

## 2024-11-15 LAB — CBC WITH DIFFERENTIAL/PLATELET
Abs Immature Granulocytes: 0.5 K/uL — ABNORMAL HIGH (ref 0.00–0.07)
Band Neutrophils: 1 %
Basophils Absolute: 0.2 K/uL — ABNORMAL HIGH (ref 0.0–0.1)
Basophils Relative: 2 %
Blasts: 2 %
Eosinophils Absolute: 0.8 K/uL — ABNORMAL HIGH (ref 0.0–0.5)
Eosinophils Relative: 8 %
HCT: 28.3 % — ABNORMAL LOW (ref 36.0–46.0)
Hemoglobin: 8.7 g/dL — ABNORMAL LOW (ref 12.0–15.0)
Lymphocytes Relative: 12 %
Lymphs Abs: 1.1 K/uL (ref 0.7–4.0)
MCH: 25.3 pg — ABNORMAL LOW (ref 26.0–34.0)
MCHC: 30.7 g/dL (ref 30.0–36.0)
MCV: 82.3 fL (ref 80.0–100.0)
Metamyelocytes Relative: 2 %
Monocytes Absolute: 0.5 K/uL (ref 0.1–1.0)
Monocytes Relative: 5 %
Myelocytes: 2 %
Neutro Abs: 6.2 K/uL (ref 1.7–7.7)
Neutrophils Relative %: 65 %
Platelets: 94 K/uL — ABNORMAL LOW (ref 150–400)
Promyelocytes Relative: 1 %
RBC: 3.44 MIL/uL — ABNORMAL LOW (ref 3.87–5.11)
RDW: 21 % — ABNORMAL HIGH (ref 11.5–15.5)
WBC: 9.4 K/uL (ref 4.0–10.5)
nRBC: 0.4 % — ABNORMAL HIGH (ref 0.0–0.2)

## 2024-11-15 LAB — APTT: aPTT: 35 s (ref 24–36)

## 2024-11-15 LAB — IRON AND TIBC
Iron: 46 ug/dL (ref 28–170)
Saturation Ratios: 26 % (ref 10.4–31.8)
TIBC: 181 ug/dL — ABNORMAL LOW (ref 250–450)
UIBC: 134 ug/dL

## 2024-11-15 LAB — VITAMIN B12: Vitamin B-12: 1456 pg/mL — ABNORMAL HIGH (ref 180–914)

## 2024-11-15 LAB — FIBRINOGEN: Fibrinogen: 288 mg/dL (ref 210–475)

## 2024-11-15 LAB — FERRITIN: Ferritin: 342 ng/mL — ABNORMAL HIGH (ref 11–307)

## 2024-11-15 LAB — FOLATE: Folate: 4.8 ng/mL — ABNORMAL LOW (ref >5.9)

## 2024-11-15 MED ORDER — APIXABAN 2.5 MG PO TABS
2.5000 mg | ORAL_TABLET | Freq: Two times a day (BID) | ORAL | Status: DC
  Administered 2024-11-15 – 2024-11-19 (×8): 2.5 mg via ORAL
  Filled 2024-11-15 (×8): qty 1

## 2024-11-15 MED ORDER — IOHEXOL 300 MG/ML  SOLN
80.0000 mL | Freq: Once | INTRAMUSCULAR | Status: AC | PRN
  Administered 2024-11-15: 80 mL via INTRAVENOUS

## 2024-11-15 MED ORDER — HYDROXYUREA 500 MG PO CAPS: 500.0000 mg | ORAL_CAPSULE | Freq: Every day | ORAL | Status: DC

## 2024-11-15 MED ORDER — LEVOTHYROXINE SODIUM 50 MCG PO TABS
50.0000 ug | ORAL_TABLET | Freq: Every day | ORAL | Status: DC
  Administered 2024-11-16 – 2024-11-19 (×4): 50 ug via ORAL
  Filled 2024-11-15 (×5): qty 1

## 2024-11-15 MED ORDER — DILTIAZEM HCL ER COATED BEADS 240 MG PO CP24
240.0000 mg | ORAL_CAPSULE | Freq: Every day | ORAL | Status: DC
  Administered 2024-11-16 – 2024-11-19 (×4): 240 mg via ORAL
  Filled 2024-11-15 (×4): qty 1

## 2024-11-15 MED ORDER — SENNOSIDES-DOCUSATE SODIUM 8.6-50 MG PO TABS
1.0000 | ORAL_TABLET | Freq: Every day | ORAL | Status: DC
  Administered 2024-11-16 – 2024-11-19 (×4): 1 via ORAL
  Filled 2024-11-15 (×4): qty 1

## 2024-11-15 MED ORDER — SODIUM CHLORIDE 0.9 % IV SOLN
1.0000 g | Freq: Once | INTRAVENOUS | Status: AC
  Administered 2024-11-15: 1 g via INTRAVENOUS
  Filled 2024-11-15: qty 10

## 2024-11-15 MED ORDER — ACETAMINOPHEN 325 MG PO TABS: 650.0000 mg | ORAL_TABLET | Freq: Four times a day (QID) | ORAL | Status: DC | PRN

## 2024-11-15 MED ORDER — SODIUM CHLORIDE 0.9 % IV SOLN
500.0000 mg | INTRAVENOUS | Status: DC
  Administered 2024-11-16 (×2): 500 mg via INTRAVENOUS
  Filled 2024-11-15 (×2): qty 5

## 2024-11-15 MED ORDER — CEPHALEXIN 500 MG PO CAPS
500.0000 mg | ORAL_CAPSULE | Freq: Two times a day (BID) | ORAL | Status: DC
  Administered 2024-11-15 (×2): 500 mg via ORAL
  Filled 2024-11-15 (×2): qty 1

## 2024-11-15 MED ORDER — METOPROLOL TARTRATE 25 MG PO TABS
25.0000 mg | ORAL_TABLET | Freq: Two times a day (BID) | ORAL | Status: DC
  Administered 2024-11-15 – 2024-11-19 (×8): 25 mg via ORAL
  Filled 2024-11-15 (×8): qty 1

## 2024-11-15 MED ORDER — POLYETHYLENE GLYCOL 3350 17 G PO PACK
17.0000 g | PACK | Freq: Every day | ORAL | Status: DC
  Administered 2024-11-16 – 2024-11-19 (×4): 17 g via ORAL
  Filled 2024-11-15 (×4): qty 1

## 2024-11-15 NOTE — Telephone Encounter (Signed)
 See my chart message- patient went to ER via EMS

## 2024-11-15 NOTE — ED Notes (Addendum)
 Hospitalist Dr. Ricky repaged to Dr. Simon @ [phone]

## 2024-11-15 NOTE — ED Triage Notes (Signed)
 Pt BIB [facility] from home with complaints of lower back pain according to EMS. Pt states that her daughter sent her to get blood work. Pt is alert and oriented. Pt states that she just broke her femur in October and had surgery. Since then her back has been hurting with trying to be easy on her leg.

## 2024-11-15 NOTE — ED Provider Notes (Signed)
 Care of patient assumed from Dr. Jackquline.  This patient had recent hospital admission for femur fracture.  She was in a rehab facility for about 1 week.  Since her return home, she has been having worsening back pain, inability to ambulate, failure to thrive.  Urinalysis does show evidence of infection which may be contributing to her weakness.  She has any thrombocytopenia on her lab work.  Plan will be for admission.  Currently awaiting hospitalist callback. Physical Exam  BP (!) 147/60   Pulse 80   Temp 98.5 F (36.9 C)   Resp (!) 26   Ht 5' 1 (1.549 m)   Wt 47.6 kg   SpO2 93%   BMI 19.84 kg/m   Physical Exam Vitals and nursing note reviewed.  Constitutional:      General: She is not in acute distress.    Appearance: Normal appearance. She is well-developed. She is not ill-appearing, toxic-appearing or diaphoretic.  HENT:     Head: Normocephalic and atraumatic.     Right Ear: External ear normal.     Left Ear: External ear normal.     Nose: Nose normal.     Mouth/Throat:     Mouth: Mucous membranes are moist.  Eyes:     Extraocular Movements: Extraocular movements intact.     Conjunctiva/sclera: Conjunctivae normal.  Cardiovascular:     Rate and Rhythm: Normal rate and regular rhythm.  Pulmonary:     Effort: Pulmonary effort is normal. No respiratory distress.  Abdominal:     General: There is no distension.     Palpations: Abdomen is soft.     Tenderness: There is no abdominal tenderness.  Musculoskeletal:        General: No swelling. Normal range of motion.     Cervical back: Normal range of motion and neck supple.  Skin:    General: Skin is warm and dry.  Neurological:     General: No focal deficit present.     Mental Status: She is alert and oriented to person, place, and time.  Psychiatric:        Mood and Affect: Mood normal.        Behavior: Behavior normal.     Procedures  Procedures  ED Course / MDM    Medical Decision Making Amount and/or  Complexity of Data Reviewed Labs: ordered. Radiology: ordered.  Risk OTC drugs. Prescription drug management. Decision regarding hospitalization.   Per chart review, she presented to the emergency department on 10/14 for left knee pain secondary to a fall.  She was found to have left femur fracture.  She was admitted and underwent IM nail repair on 10/16.  She was discharged from the hospital on 10/21.  She spent several weeks in rehab facility.  She was seen in the ED 3 days ago following a fall, described as mechanical when she was trying to get to bedside commode.  She bumped her head on her walker and slid to the ground at the time.  She struck her lower back, against the walker and sustained an abrasion.  She underwent x-ray of lumbar spine and chest at that time.  Results did not show any acute findings.  Since her return home that day, she has had ongoing low back pain.  On assessment, patient resting with daughter at bedside.  Patient and daughter describe difficulty with ambulation that is secondary to both generalized weakness and pain.  Patient describes ongoing pain in her proximal right leg secondary to  prior injury in addition to low back pain.  She has not had any falls other than the 1 she had 3 days ago.  She has had a recent cough.  Will obtain CT imaging.  Additionally, patient's CBC showing new onset of thrombocytopenia, smudge cells, giant platelet cells, and left shift is concerning for possible blood disorder.  I spoke with oncology, NP Burns, who reached out to her attending, Dr. Lanny.  Recommendations were for holding her hydroxyurea  and additional lab work:iron, ferritin, folate, B12, MMA and copper levels.  These were ordered.  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  She does have areas of patchy ground glass opacities in upper lobes concerning for possible pneumonia.  She also developed hypoxia while in the ED.  SpO2 on  room air was 89%.  She was placed on 2 L of supplemental oxygen.  Antibiotics were broadened.  Patient to be admitted for further management.       Melvenia Motto, MD 11/15/24 2223

## 2024-11-15 NOTE — Evaluation (Signed)
 Physical Therapy Evaluation Patient Details Name: [name] MRN: [id number] DOB: [birth year] Today's Date: 11/15/2024  History of Present Illness  This patient is an 88 year old female, she has recently been treated in the hospital after having a fracture of the left femur, she required surgery, she has a history of atrial fibrillation, she spent a couple of weeks in a rehab facility followed by going home, her daughter and her niece split time at home with her helping her out every single day.  Today she was tried to get to the bedside commode, she slipped and fell bumping her head on her walker, sliding to the ground, her lower back also hit the walker and caused an abrasion on her back.  At this time she has no headache no neck pain no back pain no chest pain, she does complain of a cough for couple of days but does not feel short of breath.  No numbness or weakness in the legs, states that her legs are not hurting at all, paramedics were called as the patient was having difficulty getting to her feet without assistance after surgery    Clinical Impression  Patient moved into the hallway of ED; her daughter Holley is at her bedside.  Patient is pleasant and agreeable to therapist assessment.  Patient needs mod A to scoot in bed and for supine to sit with HOB elevated. She especially needs assist with moving her left leg.  Once sitting on the edge of the bed; patient demonstrates fair sitting balance; needing min A to CGA for safety.  Patient performs sit to stand to RW with mod A for boost up to standing.  She takes extra time to perform and reports some low back pain increase with movement and standing.  Patient able to take a few steps forward and then sidestep along the bed to return to sitting on the edge of the bed.  She starts trembling with walking and has significant valgus of the left knee.  Patient needs mod A to return her legs back to bed.  patient left in bed with her daughter at bedside and  nursing notified of mobility status.  Patient will benefit from continued skilled therapy services during the remainder of her hospital stay and at the next recommended venue of care to address deficits and promote return to optimal function.             If plan is discharge home, recommend the following: A lot of help with walking and/or transfers;A lot of help with bathing/dressing/bathroom;Assistance with cooking/housework;Help with stairs or ramp for entrance   Can travel by private vehicle        Equipment Recommendations None recommended by PT  Recommendations for Other Services       Functional Status Assessment Patient has had a recent decline in their functional status and demonstrates the ability to make significant improvements in function in a reasonable and predictable amount of time.     Precautions / Restrictions Precautions Precautions: Fall Recall of Precautions/Restrictions: Intact Restrictions Weight Bearing Restrictions Per Provider Order: No (patient with history of IM nail left femur in October)      Mobility  Bed Mobility Overal bed mobility: Needs Assistance Bed Mobility: Supine to Sit     Supine to sit: Mod assist, HOB elevated, Used rails     General bed mobility comments: needs nore assistance with left leg    Transfers Overall transfer level: Needs assistance Equipment used: Rolling walker (2 wheels) Transfers:  Sit to/from Stand Sit to Stand: Mod assist           General transfer comment: tends to push through right leg for sit to stand    Ambulation/Gait Ambulation/Gait assistance: Mod assist Gait Distance (Feet): 2 Feet (patient starts to tremble; takes short shuffling steps and noted discomfort with weight shift to the left) Assistive device: Rolling walker (2 wheels) Gait Pattern/deviations: Shuffle, Antalgic Gait velocity: decreased        Stairs            Wheelchair Mobility     Tilt Bed    Modified Rankin  (Stroke Patients Only)       Balance Overall balance assessment: Needs assistance Sitting-balance support: Bilateral upper extremity supported, Feet supported Sitting balance-Leahy Scale: Fair Sitting balance - Comments: needs min A to CGA to maintain sitting balance; feels like I'm going to slide off the bed   Standing balance support: During functional activity, Bilateral upper extremity supported, Reliant on assistive device for balance Standing balance-Leahy Scale: Poor Standing balance comment: fair to poor standing balance with RW and min to mod A from PT                             Pertinent Vitals/Pain Pain Assessment Pain Assessment: 0-10 Pain Score: 4  Pain Location: low back Pain Descriptors / Indicators: Aching, Sore Pain Intervention(s): Monitored during session    Home Living Family/patient expects to be discharged to:: Private residence Living Arrangements: Alone Available Help at Discharge: Family;Available 24 hours/day Type of Home: Apartment Home Access: Stairs to enter Entrance Stairs-Rails: Right;Left;Can reach both Entrance Stairs-Number of Steps: 5 steps back with bilateral railings   Home Layout: One level Home Equipment: [employer] (2 wheels);Cane - single point;BSC/3in1;Shower seat;Grab bars - tub/shower;Lift chair Additional Comments: was using a SPC prior to fall and femur fracture in October    Prior Function Prior Level of Function : Needs assist;History of Falls (last six months)       Physical Assist : Mobility (physical);ADLs (physical) (needs help with all transfers and with dressing and bathing) Mobility (physical): Bed mobility;Transfers;Gait;Stairs ADLs (physical): Bathing;Dressing;Toileting;IADLs         Extremity/Trunk Assessment        Lower Extremity Assessment Lower Extremity Assessment: Generalized weakness;LLE deficits/detail (significant left knee valgus and discomfort with weight shifting onto left  leg)       Communication   Communication Communication: No apparent difficulties    Cognition Arousal: Alert Behavior During Therapy: WFL for tasks assessed/performed   PT - Cognitive impairments: No apparent impairments                                 Cueing       General Comments      Exercises     Assessment/Plan    PT Assessment    PT Problem List         PT Treatment Interventions      PT Goals (Current goals can be found in the Care Plan section)  Acute Rehab PT Goals Patient Stated Goal: return home after rehab PT Goal Formulation: With patient/family Time For Goal Achievement: 11/29/24 Potential to Achieve Goals: Good    Frequency       Co-evaluation               AM-PAC PT 6 Clicks Mobility  Outcome Measure Help needed turning from your back to your side while in a flat bed without using bedrails?: A Lot Help needed moving from lying on your back to sitting on the side of a flat bed without using bedrails?: A Lot Help needed moving to and from a bed to a chair (including a wheelchair)?: A Lot Help needed standing up from a chair using your arms (e.g., wheelchair or bedside chair)?: A Lot Help needed to walk in hospital room?: A Lot Help needed climbing 3-5 steps with a railing? : Total 6 Click Score: 11    End of Session Equipment Utilized During Treatment: Gait belt Activity Tolerance: Patient tolerated treatment well (fatigues quickly) Patient left: in bed;with family/visitor present (in ED hallway) Nurse Communication: Mobility status PT Visit Diagnosis: Unsteadiness on feet (R26.81);Repeated falls (R29.6);Other abnormalities of gait and mobility (R26.89);Muscle weakness (generalized) (M62.81);History of falling (Z91.81)    Time: 1510-1535 PT Time Calculation (min) (ACUTE ONLY): 25 min   Charges:   PT Evaluation $PT Eval Moderate Complexity: 1 Mod   PT General Charges $$ ACUTE PT VISIT: 1 Visit        3:53  PM, 11/15/24 Gary Gabrielsen Small Marli Diego MPT [facility] [place] [phone] Ph:[phone]

## 2024-11-15 NOTE — ED Notes (Signed)
 CSW notes TOC consult for SNF placement. CSW notes that PT eval has been ordered. Once therapy has worked with pt TOC will follow for recommendations and follow with pt and family. CSW notes per chart review pt has been to [facility] in past for SNF. TOC to follow.

## 2024-11-15 NOTE — ED Provider Notes (Signed)
 [place] EMERGENCY DEPARTMENT AT [facility] Provider Note   CSN: [id number] Arrival date & time: 11/15/24  1035     Patient presents with: Back Pain   [name] is a 88 y.o. female.    Back Pain   Presents because of ongoing back pain, difficulty ambulating.  Lives with daughter.  Having a hard time with ambulation as well as mobility at home.  Patient was initially sent to rehab facility after her femur fracture but unfortunately was only approved for about a week with her insurance company and subsequently was sent home.  Since then, patient said increasing weakness.  Has had 1 fall since then as well as multiple near falls.  According to daughter, took her over an hour in order to get her out of bed this morning because of patient's weakness and inability to really hold her own weight.  She has been walking with a walker but very unsteady on her feet.  Decreased p.o. intake over the past couple of weeks.  Weight loss.  No nausea vomiting or diarrhea.  Increased confusion given lack of mobility and not getting outside.  Daughter is afraid that she is getting weaker and is unable to take her home at this time with her current state.   Currently endorsing chronic back pain.  No bowel bladder incontinence.  No saddle anesthesia.  Tylenol  has been helping with the pain.     Previous medical history reviewed : Patient was last seen in the ED on 09/12/2024.  Was seen because of fall.  Workup at that time pleated x-rays of the lumbar spine.  Age-indeterminate superior endplate compression of L1-L2 and L4.  Change T11 compression fracture.   Prior to Admission medications   Medication Sig Start Date End Date Taking? Authorizing Provider  acetaminophen  (TYLENOL ) 325 MG tablet Take 2 tablets (650 mg total) by mouth every 6 (six) hours as needed for mild pain (pain score 1-3) or fever (or Fever >/= 101). 10/05/24   Shahmehdi, Adriana LABOR, MD  apixaban  (ELIQUIS ) 2.5 MG TABS tablet  Take 1 tablet (2.5 mg total) by mouth 2 (two) times daily. 10/20/24   Landy Barnie GORMAN, NP  diltiazem  (CARDIZEM  CD) 240 MG 24 hr capsule Take 1 capsule (240 mg total) by mouth daily. 10/20/24 11/19/24  Landy Barnie GORMAN, NP  fluticasone  (FLONASE ) 50 MCG/ACT nasal spray Place 2 sprays into both nostrils daily. 10/20/24   Landy Barnie GORMAN, NP  hydroxyurea  (HYDREA ) 500 MG capsule May take with food to minimize GI side effects on Monday - Friday only 10/20/24   Landy Barnie GORMAN, NP  levothyroxine  (SYNTHROID ) 50 MCG tablet Take 1 tablet (50 mcg total) by mouth daily before breakfast. 10/20/24   Landy Barnie GORMAN, NP  loratadine (CLARITIN) 10 MG tablet Take 10 mg by mouth daily as needed for allergies.    [provider]  metoprolol  tartrate (LOPRESSOR ) 25 MG tablet Take 1 tablet (25 mg total) by mouth 2 (two) times daily. 10/20/24 01/18/25  Landy Barnie GORMAN, NP  mupirocin ointment (BACTROBAN) 2 % Apply 1 Application topically 2 (two) times daily. 11/12/24   Cleotilde Rogue, MD  polyethylene glycol (MIRALAX  / GLYCOLAX ) 17 g packet Take 17 g by mouth daily. 10/06/24   Shahmehdi, Adriana LABOR, MD  senna-docusate (SENOKOT-S) 8.6-50 MG tablet Take 1 tablet by mouth at bedtime. 10/05/24   Shahmehdi, Adriana LABOR, MD  Vitamin D , Ergocalciferol , (DRISDOL ) 1.25 MG (50000 UNIT) CAPS capsule Take 1 capsule (50,000 Units total)  by mouth every 14 (fourteen) days. 10/20/24   Landy Barnie RAMAN, NP    Allergies: Codeine, Diovan [valsartan], Lamisil [terbinafine], and Penicillins    Review of Systems  Musculoskeletal:  Positive for back pain.    Updated Vital Signs BP (!) 147/60   Pulse 80   Temp 98.5 F (36.9 C)   Resp (!) 26   Ht 5' 1 (1.549 m)   Wt 47.6 kg   SpO2 93%   BMI 19.84 kg/m   Physical Exam Vitals and nursing note reviewed.  Constitutional:      General: She is not in acute distress.    Appearance: She is well-developed.  HENT:     Head: Normocephalic and atraumatic.  Eyes:     Conjunctiva/sclera:  Conjunctivae normal.  Cardiovascular:     Rate and Rhythm: Normal rate and regular rhythm.     Heart sounds: No murmur heard. Pulmonary:     Effort: Pulmonary effort is normal. No respiratory distress.     Breath sounds: Normal breath sounds.  Abdominal:     Palpations: Abdomen is soft.     Tenderness: There is no abdominal tenderness.  Musculoskeletal:        General: No swelling.       Arms:     Cervical back: Neck supple.  Skin:    General: Skin is warm and dry.     Capillary Refill: Capillary refill takes less than 2 seconds.  Neurological:     Mental Status: She is alert.  Psychiatric:        Mood and Affect: Mood normal.     (all labs ordered are listed, but only abnormal results are displayed) Labs Reviewed  CBC WITH DIFFERENTIAL/PLATELET - Abnormal; Notable for the following components:      Result Value   RBC 3.44 (*)    Hemoglobin 8.7 (*)    HCT 28.3 (*)    MCH 25.3 (*)    RDW 21.0 (*)    Platelets 94 (*)    nRBC 0.4 (*)    Eosinophils Absolute 0.8 (*)    Basophils Absolute 0.2 (*)    Abs Immature Granulocytes 0.50 (*)    All other components within normal limits  COMPREHENSIVE METABOLIC PANEL WITH GFR - Abnormal; Notable for the following components:   Sodium 134 (*)    Glucose, Bld 110 (*)    Calcium 8.0 (*)    Total Protein 5.7 (*)    Albumin 2.8 (*)    Alkaline Phosphatase 250 (*)    All other components within normal limits  URINALYSIS, ROUTINE W REFLEX MICROSCOPIC - Abnormal; Notable for the following components:   APPearance HAZY (*)    Hgb urine dipstick MODERATE (*)    Leukocytes,Ua LARGE (*)    Bacteria, UA RARE (*)    All other components within normal limits  PATHOLOGIST SMEAR REVIEW    EKG: EKG Interpretation Date/Time:  Monday November 15 2024 11:34:05 EST Ventricular Rate:  84 PR Interval:    QRS Duration:  96 QT Interval:  423 QTC Calculation: 501 R Axis:   97  Text Interpretation: Normal sinus rhythm Right axis deviation  Borderline T abnormalities, anterior leads Prolonged QT interval Confirmed by Simon Rea [phone]) on 11/15/2024 12:04:40 PM  Radiology: No results found.   Procedures   Medications Ordered in the ED  cephALEXin  (KEFLEX ) capsule 500 mg (500 mg Oral Given 11/15/24 1441)  acetaminophen  (TYLENOL ) tablet 650 mg (has no administration in time range)  apixaban  (  ELIQUIS ) tablet 2.5 mg (has no administration in time range)  diltiazem  (CARDIZEM  CD) 24 hr capsule 240 mg (has no administration in time range)  hydroxyurea  (HYDREA ) capsule 500 mg (has no administration in time range)  levothyroxine  (SYNTHROID ) tablet 50 mcg (has no administration in time range)  metoprolol  tartrate (LOPRESSOR ) tablet 25 mg (has no administration in time range)  polyethylene glycol (MIRALAX  / GLYCOLAX ) packet 17 g (has no administration in time range)  senna-docusate (Senokot-S) tablet 1 tablet (has no administration in time range)                                    Medical Decision Making Amount and/or Complexity of Data Reviewed Labs: ordered.  Risk OTC drugs. Prescription drug management.     HPI:    Presents because of ongoing back pain, difficulty ambulating.  Lives with daughter.  Having a hard time with ambulation as well as mobility at home.  Patient was initially sent to rehab facility after her femur fracture but unfortunately was only approved for about a week with her insurance company and subsequently was sent home.  Since then, patient said increasing weakness.  Has had 1 fall since then as well as multiple near falls.  According to daughter, took her over an hour in order to get her out of bed this morning because of patient's weakness and inability to really hold her own weight.  She has been walking with a walker but very unsteady on her feet.  Decreased p.o. intake over the past couple of weeks.  Weight loss.  No nausea vomiting or diarrhea.  Increased confusion given lack of mobility and not  getting outside.  Daughter is afraid that she is getting weaker and is unable to take her home at this time with her current state.   Currently endorsing chronic back pain.  No bowel bladder incontinence.  No saddle anesthesia.  Tylenol  has been helping with the pain.     Previous medical history reviewed : Patient was last seen in the ED on 09/12/2024.  Was seen because of fall.  Workup at that time pleated x-rays of the lumbar spine.  Age-indeterminate superior endplate compression of L1-L2 and L4.  Change T11 compression fracture.   MDM:   Upon exam, patient ANO x 3 GCS 15.  No focal deficits.  Cranial nerves II through XII intact.  Hemodynamically stable.  Afebrile  Laboratory workup to assess for AKI or large electrolyte derangements given lack of p.o. intake.  I think the lack of p.o. intake is being driven probably by deconditioning as well as lack of physical activity.  Soft benign abdomen.  No rebound guarding.  Concerns any kind of acute surgical pathology of the abdomen.  No indication for imaging.  Obtain UA to rule out any UTI given weakness   No new falls.  Chronic back pain.  No new acute back pain.  No acute imaging needed at this time.  She would likely need physical therapy and placement.  Reevaluation:   Upon reexamination, patient hemodynamically stable.  Remains A&O x 3 with GCS 15.  Questionable UTI.  Will add on culture.  Asymptomatic from this.  Will start patient on Keflex .  History of allergy to penicillins.  Reviewed patient's chart.  Has never had a reaction to Keflex  in the past.  Last prescribed this about 1 to 2 months ago.  , Patient's labs stable.  Hemoglobin 8.7 which is around baseline.  Thrombocytopenia.  Platelet count of 94.  No petechiae on exam.  Will need to be monitored.  Will place repeat CBC tomorrow morning.  Patient will need to be held for [facility] consult.   Interventions: keflex   EKG Interpreted by Me: sinus   Social Determinant of  Health: lives with daughter    Disposition and Follow Up: TOC       Final diagnoses:  Chronic bilateral low back pain without sciatica  UTI (urinary tract infection), bacterial    ED Discharge Orders     None          Simon Lavonia SAILOR, MD 11/15/24 1446

## 2024-11-16 DIAGNOSIS — K219 Gastro-esophageal reflux disease without esophagitis: Secondary | ICD-10-CM

## 2024-11-16 DIAGNOSIS — I10 Essential (primary) hypertension: Secondary | ICD-10-CM | POA: Diagnosis not present

## 2024-11-16 DIAGNOSIS — E039 Hypothyroidism, unspecified: Secondary | ICD-10-CM | POA: Diagnosis not present

## 2024-11-16 DIAGNOSIS — D638 Anemia in other chronic diseases classified elsewhere: Secondary | ICD-10-CM | POA: Diagnosis present

## 2024-11-16 DIAGNOSIS — R531 Weakness: Secondary | ICD-10-CM | POA: Diagnosis not present

## 2024-11-16 DIAGNOSIS — J9601 Acute respiratory failure with hypoxia: Secondary | ICD-10-CM | POA: Diagnosis not present

## 2024-11-16 DIAGNOSIS — I480 Paroxysmal atrial fibrillation: Secondary | ICD-10-CM

## 2024-11-16 DIAGNOSIS — D696 Thrombocytopenia, unspecified: Secondary | ICD-10-CM

## 2024-11-16 DIAGNOSIS — D45 Polycythemia vera: Secondary | ICD-10-CM | POA: Diagnosis not present

## 2024-11-16 DIAGNOSIS — J189 Pneumonia, unspecified organism: Secondary | ICD-10-CM

## 2024-11-16 LAB — COMPREHENSIVE METABOLIC PANEL WITH GFR
ALT: 8 U/L (ref 0–44)
AST: 34 U/L (ref 15–41)
Albumin: 2.6 g/dL — ABNORMAL LOW (ref 3.5–5.0)
Alkaline Phosphatase: 224 U/L — ABNORMAL HIGH (ref 38–126)
Anion gap: 9 (ref 5–15)
BUN: 13 mg/dL (ref 8–23)
CO2: 24 mmol/L (ref 22–32)
Calcium: 8 mg/dL — ABNORMAL LOW (ref 8.9–10.3)
Chloride: 100 mmol/L (ref 98–111)
Creatinine, Ser: 0.53 mg/dL (ref 0.44–1.00)
GFR, Estimated: >60 mL/min (ref >60)
Glucose, Bld: 94 mg/dL (ref 70–99)
Potassium: 4.1 mmol/L (ref 3.5–5.1)
Sodium: 133 mmol/L — ABNORMAL LOW (ref 135–145)
Total Bilirubin: 0.7 mg/dL (ref 0.0–1.2)
Total Protein: 5.6 g/dL — ABNORMAL LOW (ref 6.5–8.1)

## 2024-11-16 LAB — MAGNESIUM: Magnesium: 2.1 mg/dL (ref 1.7–2.4)

## 2024-11-16 LAB — CBC
HCT: 27.5 % — ABNORMAL LOW (ref 36.0–46.0)
Hemoglobin: 8.5 g/dL — ABNORMAL LOW (ref 12.0–15.0)
MCH: 25.1 pg — ABNORMAL LOW (ref 26.0–34.0)
MCHC: 30.9 g/dL (ref 30.0–36.0)
MCV: 81.4 fL (ref 80.0–100.0)
Platelets: 90 K/uL — ABNORMAL LOW (ref 150–400)
RBC: 3.38 MIL/uL — ABNORMAL LOW (ref 3.87–5.11)
RDW: 20.9 % — ABNORMAL HIGH (ref 11.5–15.5)
WBC: 8.8 K/uL (ref 4.0–10.5)
nRBC: 0.2 % (ref 0.0–0.2)

## 2024-11-16 LAB — PATHOLOGIST SMEAR REVIEW

## 2024-11-16 LAB — PHOSPHORUS: Phosphorus: 3.9 mg/dL (ref 2.5–4.6)

## 2024-11-16 MED ORDER — PANTOPRAZOLE SODIUM 40 MG PO TBEC
40.0000 mg | DELAYED_RELEASE_TABLET | Freq: Every day | ORAL | Status: DC
  Administered 2024-11-16 – 2024-11-19 (×4): 40 mg via ORAL
  Filled 2024-11-16 (×4): qty 1

## 2024-11-16 MED ORDER — METHYLPREDNISOLONE SODIUM SUCC 40 MG IJ SOLR
40.0000 mg | INTRAMUSCULAR | Status: DC
  Administered 2024-11-16 – 2024-11-18 (×3): 40 mg via INTRAVENOUS
  Filled 2024-11-16 (×4): qty 1

## 2024-11-16 MED ORDER — LORATADINE 10 MG PO TABS: 10.0000 mg | ORAL_TABLET | Freq: Every day | ORAL | Status: DC | PRN

## 2024-11-16 MED ORDER — OXYCODONE HCL 5 MG PO TABS: 5.0000 mg | ORAL_TABLET | Freq: Four times a day (QID) | ORAL | Status: DC | PRN

## 2024-11-16 MED ORDER — FLUTICASONE PROPIONATE 50 MCG/ACT NA SUSP
2.0000 | Freq: Every day | NASAL | Status: DC
  Administered 2024-11-16 – 2024-11-19 (×4): 2 via NASAL
  Filled 2024-11-16 (×2): qty 16

## 2024-11-16 MED ORDER — FOLIC ACID 1 MG PO TABS
1.0000 mg | ORAL_TABLET | Freq: Every day | ORAL | Status: DC
  Administered 2024-11-16 – 2024-11-19 (×4): 1 mg via ORAL
  Filled 2024-11-16 (×4): qty 1

## 2024-11-16 MED ORDER — METHOCARBAMOL 1000 MG/10ML IJ SOLN: 500.0000 mg | Freq: Three times a day (TID) | INTRAMUSCULAR | Status: DC | PRN

## 2024-11-16 MED ORDER — ONDANSETRON HCL 4 MG PO TABS: 4.0000 mg | ORAL_TABLET | Freq: Four times a day (QID) | ORAL | Status: DC | PRN

## 2024-11-16 MED ORDER — DM-GUAIFENESIN ER 30-600 MG PO TB12
1.0000 | ORAL_TABLET | Freq: Two times a day (BID) | ORAL | Status: DC
  Administered 2024-11-16 – 2024-11-19 (×7): 1 via ORAL
  Filled 2024-11-16 (×7): qty 1

## 2024-11-16 MED ORDER — SODIUM CHLORIDE 0.9 % IV SOLN: INTRAVENOUS | Status: AC

## 2024-11-16 MED ORDER — ACETAMINOPHEN 325 MG PO TABS: 650.0000 mg | ORAL_TABLET | Freq: Four times a day (QID) | ORAL | Status: DC | PRN

## 2024-11-16 MED ORDER — ONDANSETRON HCL 4 MG/2ML IJ SOLN
4.0000 mg | Freq: Four times a day (QID) | INTRAMUSCULAR | Status: DC | PRN
  Filled 2024-11-16: qty 2

## 2024-11-16 MED ORDER — HYDROMORPHONE HCL 1 MG/ML IJ SOLN: 0.5000 mg | INTRAMUSCULAR | Status: DC | PRN

## 2024-11-16 MED ORDER — SODIUM CHLORIDE 0.9 % IV SOLN
1.0000 g | INTRAVENOUS | Status: DC
  Administered 2024-11-16 – 2024-11-18 (×3): 1 g via INTRAVENOUS
  Filled 2024-11-16 (×3): qty 10

## 2024-11-16 MED ORDER — IPRATROPIUM-ALBUTEROL 0.5-2.5 (3) MG/3ML IN SOLN: 3.0000 mL | Freq: Four times a day (QID) | RESPIRATORY_TRACT | Status: DC | PRN

## 2024-11-16 NOTE — Assessment & Plan Note (Signed)
-   With concern for associated pneumonia and presumed UTI - Patient reports no frank dysuria - Culture has been taken - Antibiotics and fluid resuscitation provided - Physical therapy evaluation requested and will follow recommendations. - Patient with a recent femur fracture and short-term rehabilitation but unfortunately continues to struggle and fail recovery after discharge home. -Continue supportive care; TOC has been made aware to assist with placement or any further needs.

## 2024-11-16 NOTE — Consult Note (Signed)
 [name]  CONSULTING PHYSICIAN: Dr. Melvenia  REASON FOR CONSULT: DM, history of polycythemia vera, thrombocytopenia    HISTORY OF PRESENT ILLNESS:   [name] is a 88 y.o. female with past medical history of polycythemia vera, atrial fibrillation, hypertension, hypothyroidism, constipation, recent displaced fracture of left femur who currently resides at a skilled nursing facility.   Patient is currently on hydroxyurea  for polycythemia vera.  She was last seen by me about 6 months ago and her counts were stable taking hydroxyurea  500 mg daily Monday through Friday.  Since that time, she fell and fractured her left femur and is currently staying in a skilled nursing facility.  On 10/12/2024 platelets were normal but hemoglobin started to trend down .recently was admitted to the hospital for progressive weakness, dizzy spells, back pain and a cough over the course of the past few weeks.  ED workup was suggestive of UTI and pneumonia.  She was started on IV antibiotics and admitted to the hospital.      MEDICATIONS: I have reviewed the patient's current medications.       PERFORMANCE STATUS: The patient's performance status is 2 - Symptomatic, <50% confined to bed  PHYSICAL EXAM: Most Recent Vital Signs: Blood pressure (!) 134/103, pulse 92, temperature 97.8 F (36.6 C), temperature source Oral, resp. rate 12, height 6' (1.829 m), weight 270 lb (122.5 kg), SpO2 91%.  GENERAL:alert, no distress and comfortable SKIN: skin color, texture, turgor are normal, no rashes or significant lesions EYES: normal, conjunctiva are pink and non-injected, sclera clear OROPHARYNX:no exudate, no erythema and lips, buccal mucosa, and tongue normal  NECK: supple, thyroid  normal size, non-tender, without nodularity LYMPH:  no palpable lymphadenopathy in the cervical, axillary or inguinal LUNGS: clear to auscultation and percussion with normal breathing  effort HEART: regular rate & rhythm and no murmurs and no lower extremity edema ABDOMEN:abdomen soft, non-tender and normal bowel sounds Musculoskeletal:no cyanosis of digits and no clubbing  PSYCH: alert & oriented x 3 with fluent speech NEURO: no focal motor/sensory deficits    LABORATORY DATA:   Last CBC Lab Results  Component Value Date   WBC 8.8 11/16/2024   HGB 8.5 (L) 11/16/2024   HCT 27.5 (L) 11/16/2024   MCV 81.4 11/16/2024   MCH 25.1 (L) 11/16/2024   RDW 20.9 (H) 11/16/2024   PLT 90 (L) 11/16/2024     Last metabolic panel Lab Results  Component Value Date   GLUCOSE 94 11/16/2024   NA 133 (L) 11/16/2024   K 4.1 11/16/2024   CL 100 11/16/2024   CO2 24 11/16/2024   BUN 13 11/16/2024   CREATININE 0.53 11/16/2024   GFRNONAA >60 11/16/2024   CALCIUM 8.0 (L) 11/16/2024   PHOS 3.9 11/16/2024   PROT 5.6 (L) 11/16/2024   ALBUMIN 2.6 (L) 11/16/2024   BILITOT 0.7 11/16/2024   ALKPHOS 224 (H) 11/16/2024   AST 34 11/16/2024   ALT 8 11/16/2024   ANIONGAP 9 11/16/2024      RADIOGRAPHY: CT L-SPINE NO CHARGE CLINICAL DATA:  Back pain.  Provided history of trauma.  EXAM: CT LUMBAR SPINE WITHOUT CONTRAST  TECHNIQUE: Multidetector CT imaging of the lumbar spine was performed without intravenous contrast administration. Multiplanar CT image reconstructions were also generated.  RADIATION DOSE REDUCTION: This exam was performed according to the departmental dose-optimization program which includes automated exposure control, adjustment of the mA and/or kV according to patient size and/or use of iterative reconstruction technique.  COMPARISON:  Lumbar radiograph  11/12/2024  FINDINGS: Segmentation: 5 lumbar type vertebrae.  T12 ribs are diminutive.  Alignment: Broad-based S shaped scoliotic curvature.  No listhesis.  Vertebrae: Mild superior endplate compression fracture of L4 with 25% loss of height centrally. There is slight irregularity of the superior  endplate of L2 without definite loss of height or fracture. No definite L1 compression fracture as questioned on radiograph. The posterior elements are intact. Probable hemangioma within lef right t aspect of L1 vertebra.  Paraspinal and other soft tissues: Fully assessed on concurrent abdominopelvic CT, reported separately.  Disc levels: Multilevel degenerative disc disease and facet hypertrophy. No high-grade canal stenosis.  IMPRESSION: 1. Mild superior endplate compression fracture of L4 with 25% loss of height centrally. This is unchanged from recent radiograph, age indeterminate. 2. Slight irregularity of the superior endplate of L2 without definite loss of height or fracture. 3. No definite L1 compression fracture as questioned on radiograph. 4. Multilevel degenerative disc disease and facet hypertrophy.  Electronically Signed   By: Andrea Gasman M.D.   On: 11/15/2024 21:23 CT T-SPINE NO CHARGE CLINICAL DATA:  Back pain.  Provided history of fal trauma l.  EXAM: CT THORACIC SPINE WITHOUT CONTRAST  TECHNIQUE: Multidetector CT images of the thoracic were obtained using the standard protocol without intravenous contrast.  RADIATION DOSE REDUCTION: This exam was performed according to the departmental dose-optimization program which includes automated exposure control, adjustment of the mA and/or kV according to patient size and/or use of iterative reconstruction technique.  COMPARISON:  None Available.  FINDINGS: Alignment: Exaggerated thoracic kyphosis. Mild broad-based dextroscoliotic curvature.  Vertebrae: T12 compression fracture with approximately 20% loss of height, chronic appearing. No evidence of acute fracture. Vertebral body hemangioma within T7. posterior elements are intact.  Paraspinal and other soft tissues: Fully assessed on concurrent chest CT, reported separately.  Disc levels: No spinal canal or neural foraminal stenosis.  IMPRESSION: 1.  No acute fracture of the thoracic spine. 2. Chronic appearing T12 compression fracture with approximately 20% loss of height.  Electronically Signed   By: Andrea Gasman M.D.   On: 11/15/2024 21:19 CT CHEST ABDOMEN PELVIS W CONTRAST CLINICAL DATA:  Provided history: Polytrauma, blunt  Patient reports low back pain. Patient reports pain since left femur surgery in October.  EXAM: CT CHEST, ABDOMEN, AND PELVIS WITH CONTRAST  TECHNIQUE: Multidetector CT imaging of the chest, abdomen and pelvis was performed following the standard protocol during bolus administration of intravenous contrast.  RADIATION DOSE REDUCTION: This exam was performed according to the departmental dose-optimization program which includes automated exposure control, adjustment of the mA and/or kV according to patient size and/or use of iterative reconstruction technique.  CONTRAST:  80mL OMNIPAQUE IOHEXOL 300 MG/ML  SOLN  COMPARISON:  None Available.  FINDINGS: CT CHEST FINDINGS  Cardiovascular: Aortic atherosclerosis. No aneurysm or acute aortic findings. No central pulmonary embolus on this exam not tailored to pulmonary artery assessment. The heart is enlarged. There are coronary artery calcifications. Trace pericardial effusion.  Mediastinum/Nodes: No enlarged mediastinal or hilar lymph nodes. Decompressed esophagus. Subcentimeter bilateral thyroid  nodules. Recommend thyroid  US  and biopsy (ref: J Am Coll Radiol. 2015 Feb;12(2): 143-50).  Lungs/Pleura: Moderate right and small to moderate left pleural effusion. Associated compressive atelectasis. Smooth septal thickening typical of pulmonary edema. Areas of patchy ground-glass opacity within the anterior upper lobes. No debris in the trachea or central bronchi. Benign calcified granuloma in the right lower lobe.  Musculoskeletal: No acute fracture of the ribs, sternum, included clavicles or shoulder girdles. Generalized  body wall edema but  no confluent hematoma. Thoracic spine assessed on concurrent thoracic spine reformats, reported separately.  CT ABDOMEN PELVIS FINDINGS  Hepatobiliary: No hepatic injury or perihepatic hematoma. Gallbladder is unremarkable.  Pancreas: No ductal dilatation or inflammation. No evidence of injury.  Spleen: No splenic injury. The spleen is enlarged spanning 14.4 cm AP. No focal splenic abnormality.  Adrenals/Urinary Tract: No adrenal hemorrhage or renal injury identified. Lobulated bilateral renal contours. No suspicious renal lesion. Portions of the urinary bladder obscured by streak artifact from left hip arthroplasty. Obvious bladder injury.  Stomach/Bowel: Decompressed stomach. No obvious bowel injury or mesenteric hematoma. No bowel wall thickening. Left colonic diverticulosis. No diverticulitis.  Vascular/Lymphatic: Aortic atherosclerosis. No aortic or vascular injury. Retroaortic left renal vein. No suspicious lymphadenopathy  Reproductive: Pessary in place. Atrophic uterus potentially visualized. No adnexal mass.  Other: Generalized edema of the subcutaneous tissues. Mild edema of the intra-abdominal fat. Presacral edema without hemorrhage. No free air.  Musculoskeletal: Lumbar spine assessed on concurrent lumbar spine reformats, reported separately. Left hip arthroplasty is intact were visualized. The bones are subjectively under mineralized. Focal osteopenia versus lucent lesion within the left iliac bone, series 3, image 99. Remote healed fractures of right pubic rami. Remote right sacral fracture.  IMPRESSION: 1. No evidence of acute traumatic injury to the chest, abdomen, or pelvis. 2. Moderate right and small to moderate left pleural effusions with associated compressive atelectasis. 3. Smooth septal thickening typical of pulmonary edema. Areas of patchy ground-glass opacity within the anterior upper lobes may be related to pulmonary edema or pneumonia. 4.  Generalized body wall edema. 5. Splenomegaly. 6. Colonic diverticulosis without diverticulitis. 7. Focal osteopenia versus lucent lesion within the left iliac bone. Recommend correlation with any history of malignancy. This could be further assessed with MRI as clinically indicated.  Aortic Atherosclerosis (ICD10-I70.0).  Electronically Signed   By: Andrea Gasman M.D.   On: 11/15/2024 21:15 CT Head Wo Contrast EXAM: CT HEAD WITHOUT 11/15/2024 08:24:59 PM  TECHNIQUE: CT of the head was performed without the administration of intravenous contrast. Automated exposure control, iterative reconstruction, and/or weight based adjustment of the mA/kV was utilized to reduce the radiation dose to as low as reasonably achievable.  COMPARISON: 01/11/2024  CLINICAL HISTORY: Head trauma, minor (Age >= 65y)  FINDINGS:  BRAIN AND VENTRICLES: No acute intracranial hemorrhage. No mass effect or midline shift. No extra-axial fluid collection. No evidence of acute infarct. No hydrocephalus. Patchy periventricular and subcortical white matter low-density changes compatible with chronic microvascular ischemic change. Mild diffuse cerebral volume loss. Low lying cerebellar tonsils. Atherosclerotic calcifications in large vessels of skull base.  ORBITS: No acute abnormality.  SINUSES AND MASTOIDS: No acute abnormality.  SOFT TISSUES AND SKULL: No acute skull fracture. No acute soft tissue abnormality. There are numerous new calvarial lucencies compared to 01/11/2024.  IMPRESSION: 1. No acute intracranial abnormality 2. Numerous new calvarial lucencies compared to 01/11/24. This is a nonspecific finding that can be seen in multiple myeloma. Recommend correlation with laboratory values.  Electronically signed by: Franky Stanford MD 11/15/2024 08:38 PM EST RP Workstation: [id number] CT Cervical Spine Wo Contrast EXAM: CT CERVICAL SPINE WITHOUT CONTRAST 11/15/2024 08:24:59  PM  TECHNIQUE: CT of the cervical spine was performed without the administration of intravenous contrast. Multiplanar reformatted images are provided for review. Automated exposure control, iterative reconstruction, and/or weight based adjustment of the mA/kV was utilized to reduce the radiation dose to as low as reasonably achievable.  COMPARISON: None available.  CLINICAL HISTORY:  Neck trauma (Age >= 65y)  FINDINGS:  CERVICAL SPINE:  BONES AND ALIGNMENT: No acute fracture or traumatic malalignment.  DEGENERATIVE CHANGES: No significant degenerative changes.  SOFT TISSUES: No prevertebral soft tissue swelling.  LUNGS: Bilateral pleural effusions.  IMPRESSION: 1. No acute abnormality of the cervical spine. 2. Bilateral pleural effusions.  Electronically signed by: Franky Stanford MD 11/15/2024 08:31 PM EST RP Workstation: [id number]       ASSESSMENT: [name] is an 88 year old female who was admitted to the hospital for pneumonia and UTI and found to have anemia and thrombocytopenia.  PLAN:  Anemia: She has been intermittently anemic over the past couple of years but more consistently here over the last 6 months.  Patient had stable hemoglobin between 11 and 12 since October 2023 with a decline after she broke her femur.  Most recent hemoglobin is 8.5. In the setting of infection, we will continue to monitor hemoglobin daily. Recommend transfusion for hemoglobin less than 7 or if she is symptomatic and less than 8. Recommend holding hydroxyurea  and not restarting until hematocrit is greater than 45. Unsure of how frequent she was getting hydroxyurea  at her facility.  Thrombocytopenia: Platelet count has been more or less stable with a few highs and lows over the years but more or less normal. Most recent platelet count is 90,000. No active bleeding at this time. Continue to monitor for now.    Thank you for involving us  in this patient's care.  Please to reach  out with any questions or concerns.  Delon Hope, AGNP-C Department of [facility]  Phone: [phone]  11/16/2024 3:13 PM

## 2024-11-16 NOTE — Assessment & Plan Note (Signed)
-   Holding Hydrea  -No overt bleeding appreciated - Will follow platelet counts level.

## 2024-11-16 NOTE — ED Notes (Signed)
 Pt is eating breakfast

## 2024-11-16 NOTE — Plan of Care (Signed)

## 2024-11-16 NOTE — Assessment & Plan Note (Signed)
-   Appears to be stable -At the moment we will hold Hydrea  as patient hemoglobin and platelet counts are lower than usual baseline. -Continue to follow CBC.

## 2024-11-16 NOTE — Assessment & Plan Note (Signed)
-   In the setting of pneumonia - Mucolytics, flutter valve, bronchodilator management and IV antibiotics as mentioned above - Will wean off oxygen supplementation as tolerated.

## 2024-11-16 NOTE — H&P (Signed)
 History and Physical    Patient: [name] FMW:[id number] DOB: [birth year] DOA: 11/15/2024 DOS: the patient was seen and examined on 11/16/2024 PCP: Grooms, Raymond, PA-C   Patient coming from: Home  Chief Complaint:  Chief Complaint  Patient presents with   Back Pain   HPI: [name] is a 88 y.o. female with medical history significant of history of polycythemia vera, paroxysmal atrial fibrillation, hypertension, hypothyroidism, constipation, recent displaced comminuted fracture of the left femur with admission to a skilled nursing facility after the hospitalization; who has been brought to the emergency department secondary to difficulty walking, back pain, left lower extremity pain, productive intermittent coughing spells and increased frequency.  Patient reports no fevers, no nausea, no vomiting, no chest pain, no hematuria, no melena, no hematochezia or any other complaints.  Symptoms apparently has been present for the last week or so and worsening.  Patient with increased weakness and also decrease appetite.  Workup in the ED suggestive of possible presence of UTI, right upper lobe infiltrate/pneumonia and also thrombocytopenia.  Cultures taken, IV antibiotics has been started and [facility] contacted to place patient in the hospital.  Review of Systems: As mentioned in the history of present illness. All other systems reviewed and are negative. Past Medical History:  Diagnosis Date   Hypertension    Influenza 11/2012   Osteoporosis    Polycythemia    Polycythemia vera(238.4) 02/25/2013   Diagnosed August 2005 on Hydrea  500 mg 3 times a week with excellent control.    Prolapse, uterus, congenital    ring placement   Past Surgical History:  Procedure Laterality Date   APPENDECTOMY     COLONOSCOPY     FEMUR IM NAIL Left 09/30/2024   Procedure: INSERTION, INTRAMEDULLARY ROD, FEMUR, RETROGRADE;  Surgeon: Onesimo Oneil LABOR, MD;  Location: [facility];  Service: Orthopedics;   Laterality: Left;   INCISION AND DRAINAGE Left 01/21/2022   Procedure: INCISION AND DRAINAGE LEFT INDEX FINGER;  Surgeon: Murrell Drivers, MD;  Location: [facility];  Service: Orthopedics;  Laterality: Left;   OOPHORECTOMY     left   opperectomy     ORIF RADIAL FRACTURE Left 12/27/2022   Procedure: OPEN REDUCTION INTERNAL FIXATION (ORIF) RADIAL and ULNAR FRACTURE;  Surgeon: Margrette Taft BRAVO, MD;  Location: [facility];  Service: Orthopedics;  Laterality: Left;   REVISION TOTAL HIP ARTHROPLASTY Left    Social History:  reports that she has never smoked. She has never used smokeless tobacco. She reports that she does not drink alcohol and does not use drugs.  Allergies  Allergen Reactions   Codeine Nausea And Vomiting   Diovan [Valsartan] Itching and Rash   Lamisil [Terbinafine] Swelling   Penicillins Rash and Other (See Comments)    Breaking out in whelps     Family History  Problem Relation Age of Onset   Cancer Brother        throat cancer   Cancer Brother        lung cancer   Cancer Brother        prostate cancer    Prior to Admission medications   Medication Sig Start Date End Date Taking? Authorizing Provider  acetaminophen  (TYLENOL ) 325 MG tablet Take 2 tablets (650 mg total) by mouth every 6 (six) hours as needed for mild pain (pain score 1-3) or fever (or Fever >/= 101). 10/05/24   Shahmehdi, Adriana LABOR, MD  apixaban  (ELIQUIS ) 2.5 MG TABS tablet Take 1 tablet (2.5 mg total) by  mouth 2 (two) times daily. 10/20/24   Landy Barnie RAMAN, NP  diltiazem  (CARDIZEM  CD) 240 MG 24 hr capsule Take 1 capsule (240 mg total) by mouth daily. 10/20/24 11/19/24  Landy Barnie RAMAN, NP  fluticasone  (FLONASE ) 50 MCG/ACT nasal spray Place 2 sprays into both nostrils daily. 10/20/24   Landy Barnie RAMAN, NP  levothyroxine  (SYNTHROID ) 50 MCG tablet Take 1 tablet (50 mcg total) by mouth daily before breakfast. 10/20/24   Landy Barnie RAMAN, NP  loratadine (CLARITIN) 10 MG tablet Take 10 mg by mouth  daily as needed for allergies.    [provider]  metoprolol  tartrate (LOPRESSOR ) 25 MG tablet Take 1 tablet (25 mg total) by mouth 2 (two) times daily. 10/20/24 01/18/25  Landy Barnie RAMAN, NP  polyethylene glycol (MIRALAX  / GLYCOLAX ) 17 g packet Take 17 g by mouth daily. 10/06/24   Shahmehdi, Adriana LABOR, MD  senna-docusate (SENOKOT-S) 8.6-50 MG tablet Take 1 tablet by mouth at bedtime. 10/05/24   Shahmehdi, Adriana LABOR, MD  Vitamin D , Ergocalciferol , (DRISDOL ) 1.25 MG (50000 UNIT) CAPS capsule Take 1 capsule (50,000 Units total) by mouth every 14 (fourteen) days. 10/20/24   Landy Barnie RAMAN, NP    Physical Exam: Vitals:   11/16/24 0500 11/16/24 0530 11/16/24 0600 11/16/24 0730  BP: 126/66 113/77 (!) 141/61   Pulse: 82 81 85   Resp: (!) 23 (!) 22 19   Temp:   98.4 F (36.9 C)   TempSrc:   Oral   SpO2: 97% 97% 97% 94%  Weight:      Height:       General exam: Alert, awake, oriented x 3; following commands appropriately.  In no major distress. Respiratory system: Fair air movement bilaterally; 2 L nasal cannula in place.  Positive scattered rhonchi respiratory wheezing on exam. Cardiovascular system:rate controlled, no rubs, no gallops and no JVD. Gastrointestinal system: Abdomen is nondistended, soft and nontender. No organomegaly or masses felt. Normal bowel sounds heard. Central nervous system: Moving 4 limbs spontaneously.  Generally weak.  No focal neurological deficits. Extremities: No cyanosis or clubbing; trace to 1+ edema bilaterally. Skin: No rashes, no petechiae. Psychiatry: Judgement and insight appear normal. Mood & affect appropriate.   Data Reviewed: CBC: WBCs 8.8, hemoglobin 8.5 and platelet count 90K Comprehensive metabolic panel: Sodium 133, potassium 4.1, chloride 100, bicarb 24, BUN 13, creatinine 0.53, normal LFTs except for alk phos of 224 and a GFR >60 Magnesium: 2.1  Assessment and Plan: Assessment & Plan Generalized weakness - With concern for associated  pneumonia and presumed UTI - Patient reports no frank dysuria - Culture has been taken - Antibiotics and fluid resuscitation provided - Physical therapy evaluation requested and will follow recommendations. - Patient with a recent femur fracture and short-term rehabilitation but unfortunately continues to struggle and fail recovery after discharge home. -Continue supportive care; TOC has been made aware to assist with placement or any further needs. Polycythemia vera in remission (HCC) - Appears to be stable -At the moment we will hold Hydrea  as patient hemoglobin and platelet counts are lower than usual baseline. -Continue to follow CBC. Hypothyroidism - Continue Synthroid . HTN (hypertension) - Continue current antihypertensive agents. - Follow vital signs. CAP (community acquired pneumonia) - Appreciated on chest images at time of admission - Patient also reporting productive coughing spells and intermittent associated shortness of breath. - IV antibiotics, mucolytic's, bronchodilator management and low-dose of daily steroids has been initiated - Will follow clinical response.   -Continue supportive care. Acute respiratory  failure with hypoxia (HCC) - In the setting of pneumonia - Mucolytics, flutter valve, bronchodilator management and IV antibiotics as mentioned above - Will wean off oxygen supplementation as tolerated. GERD (gastroesophageal reflux disease) - Continue PPI. Paroxysmal atrial fibrillation (HCC) - Stable, well-controlled and currently sinus rhythm. - Continue Cardizem /metoprolol  for rate control - Continue Eliquis  for secondary prevention. Thrombocytopenia - Holding Hydrea  -No overt bleeding appreciated - Will follow platelet counts level. Anemia of chronic disease - With elevated ferritin level and low folic acid  - Folic acid  supplementation will be provided - B12 within normal limits - No overt bleeding appreciated.    Advance Care Planning:   Code  Status: Full Code   Consults: Hem/Oncology curbside by EDP  Family Communication: no family at bedside.  Severity of Illness: The appropriate patient status for this patient is INPATIENT. Inpatient status is judged to be reasonable and necessary in order to provide the required intensity of service to ensure the patient's safety. The patient's presenting symptoms, physical exam findings, and initial radiographic and laboratory data in the context of their chronic comorbidities is felt to place them at high risk for further clinical deterioration. Furthermore, it is not anticipated that the patient will be medically stable for discharge from the hospital within 2 midnights of admission.   * I certify that at the point of admission it is my clinical judgment that the patient will require inpatient hospital care spanning beyond 2 midnights from the point of admission due to high intensity of service, high risk for further deterioration and high frequency of surveillance required.*  Author: Eric Nunnery, MD 11/16/2024 9:14 AM  For on call review [email or web address].

## 2024-11-16 NOTE — ED Notes (Signed)
 Pt care taken, changed patient and gave a fresh cup of water.

## 2024-11-16 NOTE — Assessment & Plan Note (Signed)
-   Appreciated on chest images at time of admission - Patient also reporting productive coughing spells and intermittent associated shortness of breath. - IV antibiotics, mucolytic's, bronchodilator management and low-dose of daily steroids has been initiated - Will follow clinical response.   -Continue supportive care.

## 2024-11-16 NOTE — Assessment & Plan Note (Signed)
-   With elevated ferritin level and low folic acid  - Folic acid  supplementation will be provided - B12 within normal limits - No overt bleeding appreciated.

## 2024-11-16 NOTE — Assessment & Plan Note (Signed)
 Continue Synthroid 

## 2024-11-16 NOTE — Assessment & Plan Note (Signed)
Blood pressure 120s to 170s. -Resume metoprolol.

## 2024-11-16 NOTE — Assessment & Plan Note (Signed)
-   Stable, well-controlled and currently sinus rhythm. - Continue Cardizem /metoprolol  for rate control - Continue Eliquis  for secondary prevention.

## 2024-11-16 NOTE — Assessment & Plan Note (Signed)
 Continue PPI.

## 2024-11-17 DIAGNOSIS — I480 Paroxysmal atrial fibrillation: Secondary | ICD-10-CM | POA: Diagnosis not present

## 2024-11-17 DIAGNOSIS — J9601 Acute respiratory failure with hypoxia: Secondary | ICD-10-CM | POA: Diagnosis not present

## 2024-11-17 DIAGNOSIS — E039 Hypothyroidism, unspecified: Secondary | ICD-10-CM | POA: Diagnosis not present

## 2024-11-17 DIAGNOSIS — R531 Weakness: Secondary | ICD-10-CM | POA: Diagnosis not present

## 2024-11-17 LAB — CBC
HCT: 25.1 % — ABNORMAL LOW (ref 36.0–46.0)
Hemoglobin: 7.8 g/dL — ABNORMAL LOW (ref 12.0–15.0)
MCH: 25.2 pg — ABNORMAL LOW (ref 26.0–34.0)
MCHC: 31.1 g/dL (ref 30.0–36.0)
MCV: 81.2 fL (ref 80.0–100.0)
Platelets: 89 K/uL — ABNORMAL LOW (ref 150–400)
RBC: 3.09 MIL/uL — ABNORMAL LOW (ref 3.87–5.11)
RDW: 20.9 % — ABNORMAL HIGH (ref 11.5–15.5)
WBC: 12.4 K/uL — ABNORMAL HIGH (ref 4.0–10.5)
nRBC: 0.3 % — ABNORMAL HIGH (ref 0.0–0.2)

## 2024-11-17 LAB — URINE CULTURE: Culture: >=100000 — AB

## 2024-11-17 LAB — BASIC METABOLIC PANEL WITH GFR
Anion gap: 9 (ref 5–15)
BUN: 16 mg/dL (ref 8–23)
CO2: 23 mmol/L (ref 22–32)
Calcium: 7.8 mg/dL — ABNORMAL LOW (ref 8.9–10.3)
Chloride: 100 mmol/L (ref 98–111)
Creatinine, Ser: 0.55 mg/dL (ref 0.44–1.00)
GFR, Estimated: >60 mL/min (ref >60)
Glucose, Bld: 106 mg/dL — ABNORMAL HIGH (ref 70–99)
Potassium: 4 mmol/L (ref 3.5–5.1)
Sodium: 131 mmol/L — ABNORMAL LOW (ref 135–145)

## 2024-11-17 LAB — COPPER, SERUM: Copper: 111 ug/dL (ref 80–158)

## 2024-11-17 MED ORDER — DOXYCYCLINE HYCLATE 100 MG PO TABS
100.0000 mg | ORAL_TABLET | Freq: Two times a day (BID) | ORAL | Status: DC
  Administered 2024-11-17 – 2024-11-19 (×5): 100 mg via ORAL
  Filled 2024-11-17 (×5): qty 1

## 2024-11-17 NOTE — Hospital Course (Signed)
 88 y.o. female with medical history significant of history of polycythemia vera, paroxysmal atrial fibrillation, hypertension, hypothyroidism, constipation, recent displaced comminuted fracture of the left femur with admission to a skilled nursing facility after the hospitalization; who has been brought to the emergency department secondary to difficulty walking, back pain, left lower extremity pain, productive intermittent coughing spells and increased frequency.  Patient reports no fevers, no nausea, no vomiting, no chest pain, no hematuria, no melena, no hematochezia or any other complaints.  Symptoms apparently has been present for the last week or so and worsening.  Patient with increased weakness and also decrease appetite.   Workup in the ED suggestive of possible presence of UTI, right upper lobe infiltrate/pneumonia and also thrombocytopenia.  Cultures taken, IV antibiotics has been started and TRH contacted to place patient in the hospital.

## 2024-11-17 NOTE — TOC Initial Note (Signed)
 Transition of Care [facility]) - Initial/Assessment Note    Patient Details  Name: [name] MRN: [id number] Date of Birth: January 24, 1936  [facility]) CM/SW Contact:    Lucie Lunger, LCSWA Phone Number: 11/17/2024, 2:33 PM  Clinical Narrative:                 CSW updated that PT is recommending SNF for pt at D/C. CSW spoke with pt at bedside to complete assessment. Pt states she lives alone and is able to complete most of her ADLs independently. Pt states that she is agreeable to SNF and requested that CSW speak with her daughter. CSW spoke with pts daughter to review. She is agreeable to SNF referral being sent to [facility] and [facility] for review. CSW to complete and send referral out for review. TOC to follow.   Expected Discharge Plan: Skilled Nursing Facility Barriers to Discharge: Continued Medical Work up   Patient Goals and CMS Choice Patient states their goals for this hospitalization and ongoing recovery are:: get stronger CMS Medicare.gov Compare Post Acute Care list provided to:: Patient Choice offered to / list presented to : Patient, Adult Children [facility] ownership interest in [facility]provided to:: Patient    Expected Discharge Plan and Services In-house Referral: Clinical Social Work Discharge Planning Services: CM Consult Post Acute Care Choice: Skilled Nursing Facility Living arrangements for the past 2 months: Single Family Home                                      Prior Living Arrangements/Services Living arrangements for the past 2 months: Single Family Home Lives with:: Self Patient language and need for interpreter reviewed:: Yes Do you feel safe going back to the place where you live?: Yes      Need for Family Participation in Patient Care: Yes (Comment) Care giver support system in place?: Yes (comment) Current home services: DME Criminal Activity/Legal Involvement Pertinent to Current Situation/Hospitalization: No - Comment  as needed  Activities of Daily Living   ADL Screening (condition at time of admission) Independently performs ADLs?: No Does the patient have a NEW difficulty with bathing/dressing/toileting/self-feeding that is expected to last >3 days?: No Does the patient have a NEW difficulty with getting in/out of bed, walking, or climbing stairs that is expected to last >3 days?: No Does the patient have a NEW difficulty with communication that is expected to last >3 days?: No Is the patient deaf or have difficulty hearing?: No Does the patient have difficulty seeing, even when wearing glasses/contacts?: No Does the patient have difficulty concentrating, remembering, or making decisions?: No  Permission Sought/Granted                  Emotional Assessment Appearance:: Appears stated age Attitude/Demeanor/Rapport: Engaged Affect (typically observed): Accepting Orientation: : Oriented to Self, Oriented to Place, Oriented to  Time, Oriented to Situation Alcohol / Substance Use: Not Applicable Psych Involvement: No (comment)  Admission diagnosis:  Acute respiratory failure with hypoxia (HCC) [J96.01] Generalized weakness [R53.1] UTI (urinary tract infection), bacterial [N39.0, A49.9] Chronic bilateral low back pain without sciatica [M54.50, G89.29] Patient Active Problem List   Diagnosis Date Noted   Generalized weakness 11/16/2024   CAP (community acquired pneumonia) 11/16/2024   Acute respiratory failure with hypoxia (HCC) 11/16/2024   GERD (gastroesophageal reflux disease) 11/16/2024   Paroxysmal atrial fibrillation (HCC) 11/16/2024   Thrombocytopenia  11/16/2024   Anemia of chronic disease 11/16/2024   Dementia (HCC) 10/12/2024   Chronic non-seasonal allergic rhinitis 10/06/2024   Leukocytosis 10/06/2024   Acute postoperative anemia due to expected blood loss 10/06/2024   Atrial fibrillation with RVR (HCC) 10/06/2024   Atrial flutter (HCC) 10/02/2024   Closed displaced comminuted  fracture of shaft of left femur (HCC) 09/28/2024   Vitamin D  deficiency 08/23/2024   Hypothyroidism 08/23/2024   HTN (hypertension) 08/23/2024   Generalized anxiety disorder with panic attacks 08/23/2024   H/O total hip arthroplasty, left 10/01/2018   Uterovaginal prolapse, complete 10/12/2013   Polycythemia vera in remission (HCC) 02/25/2013   PCP:  Mancil Pfeiffer, PA-C Pharmacy:   [facility] [place], [place] - [id number] [place] [address] Phone: [phone] Fax: [phone]     Social Drivers of Health (SDOH) Social History: SDOH Screenings   Food Insecurity: No Food Insecurity (11/16/2024)  Housing: Low Risk  (11/16/2024)  Transportation Needs: No Transportation Needs (11/16/2024)  Utilities: Not At Risk (11/16/2024)  Depression (PHQ2-9): Low Risk  (08/23/2024)  Physical Activity: Insufficiently Active (08/23/2024)  Social Connections: Socially Isolated (11/16/2024)  Stress: Stress Concern Present (08/23/2024)  Tobacco Use: Low Risk  (11/15/2024)   SDOH Interventions:     Readmission Risk Interventions    10/04/2024   10:52 AM 09/30/2024    1:52 PM 09/29/2024    1:57 PM  Readmission Risk Prevention Plan  Post Dischage Appt Complete    Medication Screening Complete Complete Complete  Transportation Screening Complete Complete Complete

## 2024-11-17 NOTE — NC FL2 (Signed)
 [place]  MEDICAID FL2 LEVEL OF CARE FORM     IDENTIFICATION  Patient Name: [name] Birthdate: Feb 07, 1936 Sex: female Admission Date (Current Location): 11/15/2024  [place] Number:  [employer] and Address:  [facility],  [address], [place] [address]      Provider Number: [id number]  Attending Physician Name and Address:  Vicci Afton CROME, MD  Relative Name and Phone Number:       Current Level of Care: Hospital Recommended Level of Care: Skilled Nursing Facility Prior Approval Number:    Date Approved/Denied:   PASRR Number: [id number]  Discharge Plan: SNF    Current Diagnoses: Patient Active Problem List   Diagnosis Date Noted   Generalized weakness 11/16/2024   CAP (community acquired pneumonia) 11/16/2024   Acute respiratory failure with hypoxia (HCC) 11/16/2024   GERD (gastroesophageal reflux disease) 11/16/2024   Paroxysmal atrial fibrillation (HCC) 11/16/2024   Thrombocytopenia 11/16/2024   Anemia of chronic disease 11/16/2024   Dementia (HCC) 10/12/2024   Chronic non-seasonal allergic rhinitis 10/06/2024   Leukocytosis 10/06/2024   Acute postoperative anemia due to expected blood loss 10/06/2024   Atrial fibrillation with RVR (HCC) 10/06/2024   Atrial flutter (HCC) 10/02/2024   Closed displaced comminuted fracture of shaft of left femur (HCC) 09/28/2024   Vitamin D  deficiency 08/23/2024   Hypothyroidism 08/23/2024   HTN (hypertension) 08/23/2024   Generalized anxiety disorder with panic attacks 08/23/2024   H/O total hip arthroplasty, left 10/01/2018   Uterovaginal prolapse, complete 10/12/2013   Polycythemia vera in remission (HCC) 02/25/2013    Orientation RESPIRATION BLADDER Height & Weight     Self, Time, Situation, Place  Normal Continent Weight: 127 lb 3.3 oz (57.7 kg) Height:  5' 0.98 (154.9 cm)  BEHAVIORAL SYMPTOMS/MOOD NEUROLOGICAL BOWEL NUTRITION STATUS      Continent Diet (Regular)   AMBULATORY STATUS COMMUNICATION OF NEEDS Skin   Extensive Assist Verbally Normal                       Personal Care Assistance Level of Assistance  Bathing, Feeding, Dressing Bathing Assistance: Limited assistance Feeding assistance: Independent Dressing Assistance: Limited assistance     Functional Limitations Info  Sight, Hearing, Speech Sight Info: Adequate Hearing Info: Adequate Speech Info: Adequate    SPECIAL CARE FACTORS FREQUENCY  PT (By licensed PT), OT (By licensed OT)     PT Frequency: 5 times weekly OT Frequency: 5 times weekly            Contractures Contractures Info: Not present    Additional Factors Info  Code Status, Allergies Code Status Info: FULL Allergies Info: Codeine, Diovan (Valsartan), Lamisil (Terbinafine), Penicillins           Current Medications (11/17/2024):  This is the current hospital active medication list Current Facility-Administered Medications  Medication Dose Route Frequency Provider Last Rate Last Admin   acetaminophen  (TYLENOL ) tablet 650 mg  650 mg Oral Q6H PRN Ricky Fines, MD       apixaban  (ELIQUIS ) tablet 2.5 mg  2.5 mg Oral BID Lemly, Tatum N, MD   2.5 mg at 11/17/24 1027   cefTRIAXone  (ROCEPHIN ) 1 g in sodium chloride  0.9 % 100 mL IVPB  1 g Intravenous Q24H Ricky Fines, MD   Stopped at 11/16/24 2215   dextromethorphan-guaiFENesin (MUCINEX DM) 30-600 MG per 12 hr tablet 1 tablet  1 tablet Oral BID Ricky Fines, MD   1 tablet at 11/17/24 1027  diltiazem  (CARDIZEM  CD) 24 hr capsule 240 mg  240 mg Oral Daily Lemly, Tatum N, MD   240 mg at 11/17/24 1027   doxycycline  (VIBRA -TABS) tablet 100 mg  100 mg Oral Q12H Johnson, Clanford L, MD       fluticasone  (FLONASE ) 50 MCG/ACT nasal spray 2 spray  2 spray Each Nare Daily Ricky Fines, MD   2 spray at 11/17/24 1032   folic acid  (FOLVITE ) tablet 1 mg  1 mg Oral Daily Ricky Fines, MD   1 mg at 11/17/24 1027   HYDROmorphone  (DILAUDID ) injection 0.5 mg  0.5 mg  Intravenous Q4H PRN Ricky Fines, MD       ipratropium-albuterol (DUONEB) 0.5-2.5 (3) MG/3ML nebulizer solution 3 mL  3 mL Nebulization Q6H PRN Ricky Fines, MD       levothyroxine  (SYNTHROID ) tablet 50 mcg  50 mcg Oral Q0600 Lemly, Tatum N, MD   50 mcg at 11/17/24 9374   loratadine (CLARITIN) tablet 10 mg  10 mg Oral Daily PRN Ricky Fines, MD       methocarbamol (ROBAXIN) injection 500 mg  500 mg Intravenous Q8H PRN Ricky Fines, MD       methylPREDNISolone  sodium succinate (SOLU-MEDROL ) 40 mg/mL injection 40 mg  40 mg Intravenous Q24H Ricky Fines, MD   40 mg at 11/17/24 1027   metoprolol  tartrate (LOPRESSOR ) tablet 25 mg  25 mg Oral BID Lemly, Tatum N, MD   25 mg at 11/17/24 1027   ondansetron  (ZOFRAN ) tablet 4 mg  4 mg Oral Q6H PRN Adefeso, Oladapo, DO       Or   ondansetron  (ZOFRAN ) injection 4 mg  4 mg Intravenous Q6H PRN Adefeso, Oladapo, DO       oxyCODONE  (Oxy IR/ROXICODONE ) immediate release tablet 5 mg  5 mg Oral Q6H PRN Ricky Fines, MD       pantoprazole (PROTONIX) EC tablet 40 mg  40 mg Oral Daily Ricky Fines, MD   40 mg at 11/17/24 1027   polyethylene glycol (MIRALAX  / GLYCOLAX ) packet 17 g  17 g Oral Daily Lemly, Tatum N, MD   17 g at 11/17/24 1027   senna-docusate (Senokot-S) tablet 1 tablet  1 tablet Oral Daily Lemly, Tatum N, MD   1 tablet at 11/17/24 1027     Discharge Medications: Please see discharge summary for a list of discharge medications.  Relevant Imaging Results:  Relevant Lab Results:   Additional Information SSN: [id number] LCSWA

## 2024-11-17 NOTE — Progress Notes (Signed)
 Physical Therapy Treatment Patient Details Name: [name] MRN: [id number] DOB: 1936-01-29 Today's Date: 11/17/2024   History of Present Illness This patient is an 88 year old female, she has recently been treated in the hospital after having a fracture of the left femur, she required surgery, she has a history of atrial fibrillation, she spent a couple of weeks in a rehab facility followed by going home, her daughter and her niece split time at home with her helping her out every single day.  Today she was tried to get to the bedside commode, she slipped and fell bumping her head on her walker, sliding to the ground, her lower back also hit the walker and caused an abrasion on her back.  At this time she has no headache no neck pain no back pain no chest pain, she does complain of a cough for couple of days but does not feel short of breath.  No numbness or weakness in the legs, states that her legs are not hurting at all, paramedics were called as the patient was having difficulty getting to her feet without assistance after surgery    PT Comments  Patient was agreeable to PT treatment session. Patient was received supine in bed, finishing with nursing staff performing bed bath. Patient reports no pain during session. Nursing finishes cleaning front side of patient. Patient requires min/mod assist for supine to sitting EOB, while nursing staff cleans back of patient in sitting. Pt demonstrates good/fair sitting balance, using RW for stability at times. With min assist, pt stands with RW support while nursing staff cleans bottom half. Pt is able to ambulate in room and halls with CGA overall due to mild unsteadiness. Distance limited due to fatigue. Pt on RA during mobility. Once checked immediately following ambulation, SpO2 remains above 90% on RA. Pt tolerates sitting in chair at end of session, call button in reach, and all needs met. Patient will benefit from continued skilled physical therapy  acutely and in recommended venue in order to address current deficits and improve overall function.    If plan is discharge home, recommend the following: A lot of help with walking and/or transfers;A lot of help with bathing/dressing/bathroom;Assistance with cooking/housework;Help with stairs or ramp for entrance   Can travel by private vehicle        Equipment Recommendations  None recommended by PT    Recommendations for Other Services       Precautions / Restrictions Precautions Precautions: Fall Recall of Precautions/Restrictions: Intact Restrictions Weight Bearing Restrictions Per Provider Order: No     Mobility  Bed Mobility Overal bed mobility: Needs Assistance Bed Mobility: Supine to Sit     Supine to sit: Mod assist, Used rails, Min assist     General bed mobility comments: Min assist this date, assisted at trunk and provided verbal cueing, HOB flat, pt demo slow labored movement    Transfers Overall transfer level: Needs assistance Equipment used: Rolling walker (2 wheels) Transfers: Sit to/from Stand Sit to Stand: Min assist           General transfer comment: Stood from bed with min A and RW for stability once standing, pt requiring inc time    Ambulation/Gait Ambulation/Gait assistance: Contact guard assist, Min assist Gait Distance (Feet): 35 Feet Assistive device: Rolling walker (2 wheels) Gait Pattern/deviations: Shuffle, Decreased step length - right, Decreased step length - left, Step-through pattern, Trunk flexed, Decreased stride length Gait velocity: decreased     General Gait Details: pt ambulates  in room and some in hall with RW and CGA for safety, demo slow labored movement, mild balance deficits of note but no overt LOB, appropriate fatigue at end of session   Stairs             Wheelchair Mobility     Tilt Bed    Modified Rankin (Stroke Patients Only)       Balance Overall balance assessment: Needs  assistance Sitting-balance support: Feet supported, No upper extremity supported Sitting balance-Leahy Scale: Fair Sitting balance - Comments: Seated EOB   Standing balance support: During functional activity, Bilateral upper extremity supported, Reliant on assistive device for balance Standing balance-Leahy Scale: Fair Standing balance comment: w/ RW                            Communication Communication Communication: No apparent difficulties  Cognition Arousal: Alert Behavior During Therapy: WFL for tasks assessed/performed   PT - Cognitive impairments: No apparent impairments                         Following commands: Intact      Cueing Cueing Techniques: Verbal cues  Exercises      General Comments        Pertinent Vitals/Pain Pain Assessment Pain Assessment: No/denies pain    Home Living                          Prior Function            PT Goals (current goals can now be found in the care plan section) Acute Rehab PT Goals Patient Stated Goal: return home after rehab PT Goal Formulation: With patient/family Time For Goal Achievement: 11/29/24 Potential to Achieve Goals: Good Progress towards PT goals: Progressing toward goals    Frequency    Min 3X/week      PT Plan      Co-evaluation              AM-PAC PT 6 Clicks Mobility   Outcome Measure  Help needed turning from your back to your side while in a flat bed without using bedrails?: A Little Help needed moving from lying on your back to sitting on the side of a flat bed without using bedrails?: A Lot Help needed moving to and from a bed to a chair (including a wheelchair)?: A Little Help needed standing up from a chair using your arms (e.g., wheelchair or bedside chair)?: A Little Help needed to walk in hospital room?: A Little Help needed climbing 3-5 steps with a railing? : A Lot 6 Click Score: 16    End of Session Equipment Utilized During  Treatment: Gait belt Activity Tolerance: Patient tolerated treatment well Patient left: in chair;with call bell/phone within reach;with chair alarm set;with nursing/sitter in room Nurse Communication: Mobility status PT Visit Diagnosis: Unsteadiness on feet (R26.81);Repeated falls (R29.6);Other abnormalities of gait and mobility (R26.89);Muscle weakness (generalized) (M62.81);History of falling (Z91.81)     Time: 8961-8946 PT Time Calculation (min) (ACUTE ONLY): 15 min  Charges:    $Therapeutic Activity: 8-22 mins PT General Charges $$ ACUTE PT VISIT: 1 Visit                     2:29 PM, 11/17/24 Dedric Ethington Powell-Butler, PT, DPT [place] with [facility]

## 2024-11-17 NOTE — Progress Notes (Signed)
 PROGRESS NOTE   [name]  FMW:[id number] DOB: 02-11-1936 DOA: 11/15/2024 PCP: Mancil Pfeiffer, PA-C   Chief Complaint  Patient presents with   Back Pain   Level of care: Med-Surg  Brief Admission History:  88 y.o. female with medical history significant of history of polycythemia vera, paroxysmal atrial fibrillation, hypertension, hypothyroidism, constipation, recent displaced comminuted fracture of the left femur with admission to a skilled nursing facility after the hospitalization; who has been brought to the emergency department secondary to difficulty walking, back pain, left lower extremity pain, productive intermittent coughing spells and increased frequency.  Patient reports no fevers, no nausea, no vomiting, no chest pain, no hematuria, no melena, no hematochezia or any other complaints.  Symptoms apparently has been present for the last week or so and worsening.  Patient with increased weakness and also decrease appetite.   Workup in the ED suggestive of possible presence of UTI, right upper lobe infiltrate/pneumonia and also thrombocytopenia.  Cultures taken, IV antibiotics has been started and TR[facility] contacted to place patient in the hospital.   Assessment and Plan:  Generalized Weakness Adult failure to thrive  - With concern for associated pneumonia and presumed UTI - Patient reports no frank dysuria - Culture has been taken - Antibiotics and fluid resuscitation provided - Physical therapy evaluation requested and will follow recommendations. - Patient with a recent femur fracture and short-term rehabilitation but unfortunately continues to struggle and fail recovery after discharge home. -Continue supportive care; TOC has been made aware to assist with placement or any further needs.  Polycythemia Vera  - Appears to be stable -At the moment we will hold Hydrea  as patient hemoglobin and platelet counts are lower than usual baseline. -Continue to follow  CBC.  Hypothyroidism - Continue Synthroid .  HTN (hypertension) - Continue current antihypertensive agents. - Follow vital signs.  CAP (community acquired pneumonia) - Appreciated on chest images at time of admission - Patient also reporting productive coughing spells and intermittent associated shortness of breath. - IV antibiotics, mucolytic's, bronchodilator management and low-dose of daily steroids has been initiated - Will follow clinical response.   -Continue supportive care.  Acute respiratory failure with hypoxia - In the setting of pneumonia - Mucolytics, flutter valve, bronchodilator management and IV antibiotics as mentioned above - Will wean off oxygen supplementation as tolerated.  GERD (gastroesophageal reflux disease) - Continue PPI.  Paroxysmal atrial fibrillation - Stable, well-controlled and currently sinus rhythm. - Continue Cardizem /metoprolol  for rate control - Continue Eliquis  for secondary prevention.  Thrombocytopenia - Holding Hydrea  -No overt bleeding appreciated - Will follow platelet counts level. Currently stable, no active bleeding.   Anemia of chronic disease - With elevated ferritin level and low folic acid  - Folic acid  supplementation will be provided - B12 within normal limits - No overt bleeding appreciated. - recheck CBC daily to follow   DVT prophylaxis:apixaban  Code Status: Full  Family Communication:  Disposition: anticipating SNF     Consultants:  Hem/Oncology curbside by EDP   Procedures:   Antimicrobials:    Subjective: Pt reports that overall she is still feeling very weak.   Objective: Vitals:   11/16/24 1339 11/16/24 2131 11/17/24 0126 11/17/24 1217  BP: (!) 154/66 128/76 (!) 131/59 122/65  Pulse: 65 63  64  Resp: 18 18 16 18   Temp: 97.9 F (36.6 C) 97.6 F (36.4 C) 98 F (36.7 C) 98 F (36.7 C)  TempSrc: Oral Oral Oral Oral  SpO2: 92% 92% 93% 97%  Weight: 57.7 kg  Height: 5' 0.98 (1.549 m)        Intake/Output Summary (Last 24 hours) at 11/17/2024 1544 Last data filed at 11/17/2024 0300 Gross per 24 hour  Intake 120 ml  Output --  Net 120 ml   Filed Weights   11/15/24 1047 11/16/24 1339  Weight: 47.6 kg 57.7 kg   Examination:  General exam: elderly frail female, awake, alert, oriented x 3. Appears calm and comfortable  Respiratory system: Clear to auscultation. Respiratory effort normal. Cardiovascular system: normal S1 & S2 heard. No JVD, murmurs, rubs, gallops or clicks. No pedal edema. Gastrointestinal system: Abdomen is nondistended, soft and nontender. No organomegaly or masses felt. Normal bowel sounds heard. Central nervous system: Alert and oriented. No focal neurological deficits. Extremities: Symmetric 5 x 5 power. Skin: No rashes, lesions or ulcers. Psychiatry: Judgement and insight appear normal. Mood & affect appropriate.   Data Reviewed: I have personally reviewed following labs and imaging studies  CBC: Recent Labs  Lab 11/15/24 1147 11/16/24 0419 11/17/24 0414  WBC 9.4 8.8 12.4*  NEUTROABS 6.2  --   --   HGB 8.7* 8.5* 7.8*  HCT 28.3* 27.5* 25.1*  MCV 82.3 81.4 81.2  PLT 94* 90* 89*    Basic Metabolic Panel: Recent Labs  Lab 11/15/24 1147 11/16/24 0419 11/17/24 0414  NA 134* 133* 131*  K 3.8 4.1 4.0  CL 101 100 100  CO2 22 24 23   GLUCOSE 110* 94 106*  BUN 15 13 16   CREATININE 0.56 0.53 0.55  CALCIUM 8.0* 8.0* 7.8*  MG  --  2.1  --   PHOS  --  3.9  --     CBG: No results for input(s): GLUCAP in the last 168 hours.  Recent Results (from the past 240 hours)  Urine Culture     Status: Abnormal   Collection Time: 11/15/24 11:09 AM   Specimen: Urine, Clean Catch  Result Value Ref Range Status   Specimen Description   Final    URINE, CLEAN CATCH Performed at [facility], [address] [place], [place] [address]    Special Requests   Final    NONE Performed at [facility], [address] [place], [place] [address]     Culture >=100,000 COLONIES/mL STAPHYLOCOCCUS HAEMOLYTICUS (A)  Final   Report Status 11/17/2024 FINAL  Final   Organism ID, Bacteria STAPHYLOCOCCUS HAEMOLYTICUS (A)  Final      Susceptibility   Staphylococcus haemolyticus - MIC*    CIPROFLOXACIN >=8 RESISTANT Resistant     GENTAMICIN >=16 RESISTANT Resistant     NITROFURANTOIN <=16 SENSITIVE Sensitive     OXACILLIN >=4 RESISTANT Resistant     TETRACYCLINE <=1 SENSITIVE Sensitive     VANCOMYCIN  1 SENSITIVE Sensitive     TRIMETH/SULFA 160 RESISTANT Resistant     RIFAMPIN >=32 RESISTANT Resistant     Inducible Clindamycin  NEGATIVE Sensitive     * >=100,000 COLONIES/mL STAPHYLOCOCCUS HAEMOLYTICUS     Radiology Studies: CT L-SPINE NO CHARGE Result Date: 11/15/2024 CLINICAL DATA:  Back pain.  Provided history of trauma. EXAM: CT LUMBAR SPINE WITHOUT CONTRAST TECHNIQUE: Multidetector CT imaging of the lumbar spine was performed without intravenous contrast administration. Multiplanar CT image reconstructions were also generated. RADIATION DOSE REDUCTION: This exam was performed according to the departmental dose-optimization program which includes automated exposure control, adjustment of the mA and/or kV according to patient size and/or use of iterative reconstruction technique. COMPARISON:  Lumbar radiograph 11/12/2024 FINDINGS: Segmentation: 5 lumbar type vertebrae.  T12 ribs  are diminutive. Alignment: Broad-based S shaped scoliotic curvature.  No listhesis. Vertebrae: Mild superior endplate compression fracture of L4 with 25% loss of height centrally. There is slight irregularity of the superior endplate of L2 without definite loss of height or fracture. No definite L1 compression fracture as questioned on radiograph. The posterior elements are intact. Probable hemangioma within lef right t aspect of L1 vertebra. Paraspinal and other soft tissues: Fully assessed on concurrent abdominopelvic CT, reported separately. Disc levels: Multilevel  degenerative disc disease and facet hypertrophy. No high-grade canal stenosis. IMPRESSION: 1. Mild superior endplate compression fracture of L4 with 25% loss of height centrally. This is unchanged from recent radiograph, age indeterminate. 2. Slight irregularity of the superior endplate of L2 without definite loss of height or fracture. 3. No definite L1 compression fracture as questioned on radiograph. 4. Multilevel degenerative disc disease and facet hypertrophy. Electronically Signed   By: Andrea Gasman M.D.   On: 11/15/2024 21:23   CT T-SPINE NO CHARGE Result Date: 11/15/2024 CLINICAL DATA:  Back pain.  Provided history of fal trauma l. EXAM: CT THORACIC SPINE WITHOUT CONTRAST TECHNIQUE: Multidetector CT images of the thoracic were obtained using the standard protocol without intravenous contrast. RADIATION DOSE REDUCTION: This exam was performed according to the departmental dose-optimization program which includes automated exposure control, adjustment of the mA and/or kV according to patient size and/or use of iterative reconstruction technique. COMPARISON:  None Available. FINDINGS: Alignment: Exaggerated thoracic kyphosis. Mild broad-based dextroscoliotic curvature. Vertebrae: T12 compression fracture with approximately 20% loss of height, chronic appearing. No evidence of acute fracture. Vertebral body hemangioma within T7. posterior elements are intact. Paraspinal and other soft tissues: Fully assessed on concurrent chest CT, reported separately. Disc levels: No spinal canal or neural foraminal stenosis. IMPRESSION: 1. No acute fracture of the thoracic spine. 2. Chronic appearing T12 compression fracture with approximately 20% loss of height. Electronically Signed   By: Andrea Gasman M.D.   On: 11/15/2024 21:19   CT CHEST ABDOMEN PELVIS W CONTRAST Result Date: 11/15/2024 CLINICAL DATA:  Provided history: Polytrauma, blunt Patient reports low back pain. Patient reports pain since left femur  surgery in October. EXAM: CT CHEST, ABDOMEN, AND PELVIS WITH CONTRAST TECHNIQUE: Multidetector CT imaging of the chest, abdomen and pelvis was performed following the standard protocol during bolus administration of intravenous contrast. RADIATION DOSE REDUCTION: This exam was performed according to the departmental dose-optimization program which includes automated exposure control, adjustment of the mA and/or kV according to patient size and/or use of iterative reconstruction technique. CONTRAST:  80mL OMNIPAQUE IOHEXOL 300 MG/ML  SOLN COMPARISON:  None Available. FINDINGS: CT CHEST FINDINGS Cardiovascular: Aortic atherosclerosis. No aneurysm or acute aortic findings. No central pulmonary embolus on this exam not tailored to pulmonary artery assessment. The heart is enlarged. There are coronary artery calcifications. Trace pericardial effusion. Mediastinum/Nodes: No enlarged mediastinal or hilar lymph nodes. Decompressed esophagus. Subcentimeter bilateral thyroid  nodules. Recommend thyroid  US  and biopsy (ref: J Am Coll Radiol. 2015 Feb;12(2): 143-50). Lungs/Pleura: Moderate right and small to moderate left pleural effusion. Associated compressive atelectasis. Smooth septal thickening typical of pulmonary edema. Areas of patchy ground-glass opacity within the anterior upper lobes. No debris in the trachea or central bronchi. Benign calcified granuloma in the right lower lobe. Musculoskeletal: No acute fracture of the ribs, sternum, included clavicles or shoulder girdles. Generalized body wall edema but no confluent hematoma. Thoracic spine assessed on concurrent thoracic spine reformats, reported separately. CT ABDOMEN PELVIS FINDINGS Hepatobiliary: No hepatic injury or perihepatic hematoma.  Gallbladder is unremarkable. Pancreas: No ductal dilatation or inflammation. No evidence of injury. Spleen: No splenic injury. The spleen is enlarged spanning 14.4 cm AP. No focal splenic abnormality. Adrenals/Urinary Tract: No  adrenal hemorrhage or renal injury identified. Lobulated bilateral renal contours. No suspicious renal lesion. Portions of the urinary bladder obscured by streak artifact from left hip arthroplasty. Obvious bladder injury. Stomach/Bowel: Decompressed stomach. No obvious bowel injury or mesenteric hematoma. No bowel wall thickening. Left colonic diverticulosis. No diverticulitis. Vascular/Lymphatic: Aortic atherosclerosis. No aortic or vascular injury. Retroaortic left renal vein. No suspicious lymphadenopathy Reproductive: Pessary in place. Atrophic uterus potentially visualized. No adnexal mass. Other: Generalized edema of the subcutaneous tissues. Mild edema of the intra-abdominal fat. Presacral edema without hemorrhage. No free air. Musculoskeletal: Lumbar spine assessed on concurrent lumbar spine reformats, reported separately. Left hip arthroplasty is intact were visualized. The bones are subjectively under mineralized. Focal osteopenia versus lucent lesion within the left iliac bone, series 3, image 99. Remote healed fractures of right pubic rami. Remote right sacral fracture. IMPRESSION: 1. No evidence of acute traumatic injury to the chest, abdomen, or pelvis. 2. Moderate right and small to moderate left pleural effusions with associated compressive atelectasis. 3. Smooth septal thickening typical of pulmonary edema. Areas of patchy ground-glass opacity within the anterior upper lobes may be related to pulmonary edema or pneumonia. 4. Generalized body wall edema. 5. Splenomegaly. 6. Colonic diverticulosis without diverticulitis. 7. Focal osteopenia versus lucent lesion within the left iliac bone. Recommend correlation with any history of malignancy. This could be further assessed with MRI as clinically indicated. Aortic Atherosclerosis (ICD10-I70.0). Electronically Signed   By: Andrea Gasman M.D.   On: 11/15/2024 21:15   CT Head Wo Contrast Result Date: 11/15/2024 EXAM: CT HEAD WITHOUT 11/15/2024  08:24:59 PM TECHNIQUE: CT of the head was performed without the administration of intravenous contrast. Automated exposure control, iterative reconstruction, and/or weight based adjustment of the mA/kV was utilized to reduce the radiation dose to as low as reasonably achievable. COMPARISON: 01/11/2024 CLINICAL HISTORY: Head trauma, minor (Age >= 65y) FINDINGS: BRAIN AND VENTRICLES: No acute intracranial hemorrhage. No mass effect or midline shift. No extra-axial fluid collection. No evidence of acute infarct. No hydrocephalus. Patchy periventricular and subcortical white matter low-density changes compatible with chronic microvascular ischemic change. Mild diffuse cerebral volume loss. Low lying cerebellar tonsils. Atherosclerotic calcifications in large vessels of skull base. ORBITS: No acute abnormality. SINUSES AND MASTOIDS: No acute abnormality. SOFT TISSUES AND SKULL: No acute skull fracture. No acute soft tissue abnormality. There are numerous new calvarial lucencies compared to 01/11/2024. IMPRESSION: 1. No acute intracranial abnormality 2. Numerous new calvarial lucencies compared to 01/11/24. This is a nonspecific finding that can be seen in multiple myeloma. Recommend correlation with laboratory values. Electronically signed by: Franky Stanford MD 11/15/2024 08:38 PM EST RP Workstation: [id number]   CT Cervical Spine Wo Contrast Result Date: 11/15/2024 EXAM: CT CERVICAL SPINE WITHOUT CONTRAST 11/15/2024 08:24:59 PM TECHNIQUE: CT of the cervical spine was performed without the administration of intravenous contrast. Multiplanar reformatted images are provided for review. Automated exposure control, iterative reconstruction, and/or weight based adjustment of the mA/kV was utilized to reduce the radiation dose to as low as reasonably achievable. COMPARISON: None available. CLINICAL HISTORY: Neck trauma (Age >= 65y) FINDINGS: CERVICAL SPINE: BONES AND ALIGNMENT: No acute fracture or traumatic malalignment.  DEGENERATIVE CHANGES: No significant degenerative changes. SOFT TISSUES: No prevertebral soft tissue swelling. LUNGS: Bilateral pleural effusions. IMPRESSION: 1. No acute abnormality of the cervical spine. 2.  Bilateral pleural effusions. Electronically signed by: Franky Stanford MD 11/15/2024 08:31 PM EST RP Workstation: [id number]    Scheduled Meds:  apixaban   2.5 mg Oral BID   dextromethorphan -guaiFENesin   1 tablet Oral BID   diltiazem   240 mg Oral Daily   doxycycline   100 mg Oral Q12H   fluticasone   2 spray Each Nare Daily   folic acid   1 mg Oral Daily   levothyroxine   50 mcg Oral Q0600   methylPREDNISolone  (SOLU-MEDROL ) injection  40 mg Intravenous Q24H   metoprolol  tartrate  25 mg Oral BID   pantoprazole   40 mg Oral Daily   polyethylene glycol  17 g Oral Daily   senna-docusate  1 tablet Oral Daily   Continuous Infusions:  cefTRIAXone  (ROCEPHIN )  IV Stopped (11/16/24 2215)     LOS: 1 day   Time spent: 55 mins  Lorris Carducci Vicci, MD How to contact the [facility] Attending or Consulting provider 7A - 7P or covering provider during after hours 7P -7A, for this patient?  Check the care team in [facility] and look for a) attending/consulting [facility] provider listed and b) the [facility] team listed Log into [email or web address] to find provider on call.  Locate the [facility] provider you are looking for under [facility] and page to a number that you can be directly reached. If you still have difficulty reaching the provider, please page the [facility] (Director on Call) for the Hospitalists listed on amion for assistance.  11/17/2024, 3:44 PM

## 2024-11-18 DIAGNOSIS — J9601 Acute respiratory failure with hypoxia: Secondary | ICD-10-CM | POA: Diagnosis not present

## 2024-11-18 DIAGNOSIS — R531 Weakness: Secondary | ICD-10-CM | POA: Diagnosis not present

## 2024-11-18 DIAGNOSIS — I480 Paroxysmal atrial fibrillation: Secondary | ICD-10-CM | POA: Diagnosis not present

## 2024-11-18 DIAGNOSIS — E039 Hypothyroidism, unspecified: Secondary | ICD-10-CM | POA: Diagnosis not present

## 2024-11-18 NOTE — Progress Notes (Signed)
 PROGRESS NOTE   [name]  FMW:[id number] DOB: 02-25-1936 DOA: 11/15/2024 PCP: Mancil Pfeiffer, PA-C   Chief Complaint  Patient presents with   Back Pain   Level of care: Med-Surg  Brief Admission History:  88 y.o. female with medical history significant of history of polycythemia vera, paroxysmal atrial fibrillation, hypertension, hypothyroidism, constipation, recent displaced comminuted fracture of the left femur with admission to a skilled nursing facility after the hospitalization; who has been brought to the emergency department secondary to difficulty walking, back pain, left lower extremity pain, productive intermittent coughing spells and increased frequency.  Patient reports no fevers, no nausea, no vomiting, no chest pain, no hematuria, no melena, no hematochezia or any other complaints.  Symptoms apparently has been present for the last week or so and worsening.  Patient with increased weakness and also decrease appetite.   Workup in the ED suggestive of possible presence of UTI, right upper lobe infiltrate/pneumonia and also thrombocytopenia.  Cultures taken, IV antibiotics has been started and TRH contacted to place patient in the hospital.   Assessment and Plan:  Generalized Weakness Adult failure to thrive  - With concern for associated pneumonia and presumed UTI - Patient reports no frank dysuria - Culture has been taken - Antibiotics and fluid resuscitation provided - Physical therapy evaluation requested and will follow recommendations. - Patient with a recent femur fracture and short-term rehabilitation but unfortunately continues to struggle and fail recovery after discharge home. -Continue supportive care; TOC has been made aware to assist with placement or any further needs.  Polycythemia Vera  - Appears to be stable -At the moment we will hold Hydrea  as patient hemoglobin and platelet counts are lower than usual baseline. -Continue to follow  CBC.  Hypothyroidism - Continue Synthroid .  HTN (hypertension) - Continue current antihypertensive agents. - Follow vital signs.  CAP (community acquired pneumonia) - Appreciated on chest images at time of admission - Patient also reporting productive coughing spells and intermittent associated shortness of breath. - IV antibiotics, mucolytic's, bronchodilator management and low-dose of daily steroids has been initiated - Will follow clinical response.   -Continue supportive care.  Acute respiratory failure with hypoxia - In the setting of pneumonia - Mucolytics, flutter valve, bronchodilator management and IV antibiotics as mentioned above - Will wean off oxygen supplementation as tolerated.  GERD (gastroesophageal reflux disease) - Continue PPI  Paroxysmal atrial fibrillation - Stable, well-controlled and currently sinus rhythm. - Continue Cardizem /metoprolol  for rate control - Continue Eliquis  for secondary prevention.  Thrombocytopenia - Holding Hydrea  - No overt bleeding appreciated - Will follow platelet counts level. Currently stable, no active bleeding.   Anemia of chronic disease - With elevated ferritin level and low folic acid  - Folic acid  supplementation will be provided - B12 within normal limits - No overt bleeding appreciated. - recheck CBC daily to follow   DVT prophylaxis:apixaban  Code Status: Full  Family Communication:  Disposition: anticipating SNF when bed ready   Consultants:  Hem/Oncology curbside by EDP   Procedures:   Antimicrobials:    Subjective: No specific complaints, eating better today   Objective: Vitals:   11/17/24 0126 11/17/24 1217 11/17/24 1957 11/18/24 0333  BP: (!) 131/59 122/65 120/63 122/63  Pulse:  64 (!) 56 64  Resp: 16 18 18 18   Temp: 98 F (36.7 C) 98 F (36.7 C) 97.7 F (36.5 C) 98.3 F (36.8 C)  TempSrc: Oral Oral Oral Oral  SpO2: 93% 97% 97% 97%  Weight:    57.3  kg  Height:        Intake/Output  Summary (Last 24 hours) at 11/18/2024 1113 Last data filed at 11/18/2024 0607 Gross per 24 hour  Intake 240 ml  Output 500 ml  Net -260 ml   Filed Weights   11/15/24 1047 11/16/24 1339 11/18/24 0333  Weight: 47.6 kg 57.7 kg 57.3 kg   Examination:  General exam: elderly frail female, awake, alert, oriented x 3. Appears calm and comfortable  Respiratory system: Clear to auscultation. Respiratory effort normal. Cardiovascular system: normal S1 & S2 heard. No JVD, murmurs, rubs, gallops or clicks. No pedal edema. Gastrointestinal system: Abdomen is nondistended, soft and nontender. No organomegaly or masses felt. Normal bowel sounds heard. Central nervous system: Alert and oriented. No focal neurological deficits. Extremities: Symmetric 5 x 5 power. Skin: No rashes, lesions or ulcers. Psychiatry: Judgement and insight appear normal. Mood & affect appropriate.   Data Reviewed: I have personally reviewed following labs and imaging studies  CBC: Recent Labs  Lab 11/15/24 1147 11/16/24 0419 11/17/24 0414  WBC 9.4 8.8 12.4*  NEUTROABS 6.2  --   --   HGB 8.7* 8.5* 7.8*  HCT 28.3* 27.5* 25.1*  MCV 82.3 81.4 81.2  PLT 94* 90* 89*    Basic Metabolic Panel: Recent Labs  Lab 11/15/24 1147 11/16/24 0419 11/17/24 0414  NA 134* 133* 131*  K 3.8 4.1 4.0  CL 101 100 100  CO2 22 24 23   GLUCOSE 110* 94 106*  BUN 15 13 16   CREATININE 0.56 0.53 0.55  CALCIUM 8.0* 8.0* 7.8*  MG  --  2.1  --   PHOS  --  3.9  --     CBG: No results for input(s): GLUCAP in the last 168 hours.  Recent Results (from the past 240 hours)  Urine Culture     Status: Abnormal   Collection Time: 11/15/24 11:09 AM   Specimen: Urine, Clean Catch  Result Value Ref Range Status   Specimen Description   Final    URINE, CLEAN CATCH Performed at [facility], [address], [place], [place] [address]    Special Requests   Final    NONE Performed at [facility], [address], [place], [place] [address]     Culture >=100,000 COLONIES/mL STAPHYLOCOCCUS HAEMOLYTICUS (A)  Final   Report Status 11/17/2024 FINAL  Final   Organism ID, Bacteria STAPHYLOCOCCUS HAEMOLYTICUS (A)  Final      Susceptibility   Staphylococcus haemolyticus - MIC*    CIPROFLOXACIN >=8 RESISTANT Resistant     GENTAMICIN >=16 RESISTANT Resistant     NITROFURANTOIN <=16 SENSITIVE Sensitive     OXACILLIN >=4 RESISTANT Resistant     TETRACYCLINE <=1 SENSITIVE Sensitive     VANCOMYCIN  1 SENSITIVE Sensitive     TRIMETH/SULFA 160 RESISTANT Resistant     RIFAMPIN >=32 RESISTANT Resistant     Inducible Clindamycin  NEGATIVE Sensitive     * >=100,000 COLONIES/mL STAPHYLOCOCCUS HAEMOLYTICUS    Radiology Studies: No results found.  Scheduled Meds:  apixaban   2.5 mg Oral BID   dextromethorphan-guaiFENesin  1 tablet Oral BID   diltiazem   240 mg Oral Daily   doxycycline   100 mg Oral Q12H   fluticasone   2 spray Each Nare Daily   folic acid   1 mg Oral Daily   levothyroxine   50 mcg Oral Q0600   methylPREDNISolone  (SOLU-MEDROL ) injection  40 mg Intravenous Q24H   metoprolol  tartrate  25 mg Oral BID   pantoprazole  40 mg Oral  Daily   polyethylene glycol  17 g Oral Daily   senna-docusate  1 tablet Oral Daily   Continuous Infusions:  cefTRIAXone  (ROCEPHIN )  IV Stopped (11/17/24 2049)    LOS: 2 days   Time spent: 55 mins  Briana Gougeon Vicci, MD How to contact the [facility] Attending or Consulting provider 7A - 7P or covering provider during after hours 7P -7A, for this patient?  Check the care team in [facility] and look for a) attending/consulting TRH provider listed and b) the TRH team listed Log into [email or web address] to find provider on call.  Locate the TRH provider you are looking for under [facility] and page to a number that you can be directly reached. If you still have difficulty reaching the provider, please page the [facility] (Director on Call) for the Hospitalists listed on amion for assistance.  11/18/2024, 11:13 AM

## 2024-11-18 NOTE — Plan of Care (Signed)

## 2024-11-18 NOTE — Progress Notes (Signed)
 11/18/2024 10:53 AM     EXPEDITER PROGRESS NOTE  Patient Name: [name]  DOB:[birth year] Date of Admission: 11/15/2024  Date of Assessment:11/18/24   -------------------------------------------------------------------------------------------------------------------   Brief clinical summary: Admitted 11/15/2024 with generalized weakness. Possible PNA and UTI.   Labs, test, and orders reviewed: yes  30-day Readmission: No  Discharge order: No  Current discharge plan: Plan to discharge to [facility] once insurance authorization approved.  If anticipated for next day discharge, barriers to discharge before 11am identified:  Potential delay if insurance authorization is not approved before 11am.   Intervention provided by [facility] team:  none indicated at this time, ICM team following closely.   Barrier resolved: not applicable    -------------------------------------------------------------------------------------------------------------------  [employer] RN Expediter, Eshan Trupiano, Corean Lobstein Please contact us  directly via secure chat (search for [facility]) or by calling us  at [phone] [facility]).

## 2024-11-18 NOTE — TOC Progression Note (Signed)
 Transition of Care [facility]) - Progression Note    Patient Details  Name: [name] MRN: [id number] Date of Birth: 1936/07/22  [facility] CM/SW Contact  Lucie Lunger, [place] Phone Number: 11/18/2024, 10:39 AM  Clinical Narrative:    CSW spoke with pts daughter to review bed offers. Pts daughter states she would like to accept bed at [facility]. Insurance shara has been started for SNF placement.   Expected Discharge Plan: Skilled Nursing Facility Barriers to Discharge: Continued Medical Work up               Expected Discharge Plan and Services In-house Referral: Clinical Social Work Discharge Planning Services: CM Consult Post Acute Care Choice: Skilled Nursing Facility Living arrangements for the past 2 months: Single Family Home                                       Social Drivers of Health (SDOH) Interventions SDOH Screenings   Food Insecurity: No Food Insecurity (11/16/2024)  Housing: Low Risk  (11/16/2024)  Transportation Needs: No Transportation Needs (11/16/2024)  Utilities: Not At Risk (11/16/2024)  Depression (PHQ2-9): Low Risk  (08/23/2024)  Physical Activity: Insufficiently Active (08/23/2024)  Social Connections: Socially Isolated (11/16/2024)  Stress: Stress Concern Present (08/23/2024)  Tobacco Use: Low Risk  (11/15/2024)    Readmission Risk Interventions    10/04/2024   10:52 AM 09/30/2024    1:52 PM 09/29/2024    1:57 PM  Readmission Risk Prevention Plan  Post Dischage Appt Complete    Medication Screening Complete Complete Complete  Transportation Screening Complete Complete Complete

## 2024-11-19 LAB — CBC
HCT: 25.9 % — ABNORMAL LOW (ref 36.0–46.0)
Hemoglobin: 8.1 g/dL — ABNORMAL LOW (ref 12.0–15.0)
MCH: 25.2 pg — ABNORMAL LOW (ref 26.0–34.0)
MCHC: 31.3 g/dL (ref 30.0–36.0)
MCV: 80.7 fL (ref 80.0–100.0)
Platelets: 95 K/uL — ABNORMAL LOW (ref 150–400)
RBC: 3.21 MIL/uL — ABNORMAL LOW (ref 3.87–5.11)
RDW: 21.1 % — ABNORMAL HIGH (ref 11.5–15.5)
WBC: 15.5 K/uL — ABNORMAL HIGH (ref 4.0–10.5)
nRBC: 0.3 % — ABNORMAL HIGH (ref 0.0–0.2)

## 2024-11-19 LAB — METHYLMALONIC ACID(MMA), RND URINE
Creatinine(Crt), U: 0.74 g/L (ref 0.30–3.00)
MMA - Normalized: 2.1 umol/mmol{creat} (ref 0.5–3.4)
Methylmalonic Acid, Ur: 13.6 umol/L (ref 1.6–29.7)

## 2024-11-19 LAB — GLUCOSE, CAPILLARY: Glucose-Capillary: 113 mg/dL — ABNORMAL HIGH (ref 70–99)

## 2024-11-19 MED ORDER — VITAMIN D (ERGOCALCIFEROL) 1.25 MG (50000 UNIT) PO CAPS: 50000.0000 [IU] | ORAL_CAPSULE | ORAL | Status: DC

## 2024-11-19 MED ORDER — DOXYCYCLINE HYCLATE 100 MG PO TABS: 100.0000 mg | ORAL_TABLET | Freq: Two times a day (BID) | ORAL | Status: AC

## 2024-11-19 MED ORDER — PREDNISONE 10 MG PO TABS: 30.0000 mg | ORAL_TABLET | Freq: Every day | ORAL | Status: AC

## 2024-11-19 MED ORDER — FOLIC ACID 1 MG PO TABS: 1.0000 mg | ORAL_TABLET | Freq: Every day | ORAL | Status: DC

## 2024-11-19 MED ORDER — PREDNISONE 20 MG PO TABS
30.0000 mg | ORAL_TABLET | Freq: Every day | ORAL | Status: DC
  Administered 2024-11-19: 30 mg via ORAL
  Filled 2024-11-19: qty 1

## 2024-11-19 MED ORDER — IPRATROPIUM-ALBUTEROL 0.5-2.5 (3) MG/3ML IN SOLN: 3.0000 mL | Freq: Four times a day (QID) | RESPIRATORY_TRACT | Status: DC | PRN

## 2024-11-19 MED ORDER — PANTOPRAZOLE SODIUM 40 MG PO TBEC: 40.0000 mg | DELAYED_RELEASE_TABLET | Freq: Every day | ORAL | 0 refills | Status: DC

## 2024-11-19 NOTE — Plan of Care (Signed)
  Problem: Education: Goal: Knowledge of General Education information will improve Description: Including pain rating scale, medication(s)/side effects and non-pharmacologic comfort measures Outcome: Progressing   Problem: Health Behavior/Discharge Planning: Goal: Ability to manage health-related needs will improve Outcome: Progressing   Problem: Clinical Measurements: Goal: Ability to maintain clinical measurements within normal limits will improve Outcome: Progressing   Problem: Activity: Goal: Risk for activity intolerance will decrease Outcome: Progressing   Problem: Pain Managment: Goal: General experience of comfort will improve and/or be controlled Outcome: Progressing   Problem: Safety: Goal: Ability to remain free from injury will improve Outcome: Progressing   Problem: Skin Integrity: Goal: Risk for impaired skin integrity will decrease Outcome: Progressing

## 2024-11-19 NOTE — Progress Notes (Signed)
 Mobility Specialist Progress Note:    11/19/24 0938  Mobility  Activity Ambulated with assistance  Level of Assistance Minimal assist, patient does 75% or more  Assistive Device Front wheel walker  Distance Ambulated (ft) 10 ft  Range of Motion/Exercises Active;All extremities  Activity Response Tolerated well  Mobility Referral Yes  Mobility visit 1 Mobility  Mobility Specialist Start Time (ACUTE ONLY) [id number]  Mobility Specialist Stop Time (ACUTE ONLY) 0950  Mobility Specialist Time Calculation (min) (ACUTE ONLY) 12 min   Pt received in chair, physical therapy beginning session. Required MinA to stand and CGA to ambulate with RW. Tolerated well, unsteady with RW. Left in bathroom with physical therapy, all needs met.  Efton Thomley Mobility Specialist Please contact via Special Educational Needs Teacher or  Rehab office at [phone]

## 2024-11-19 NOTE — Progress Notes (Signed)
 Physical Therapy Treatment Patient Details Name: [name] MRN: [id number] DOB: Jul 22, 1936 Today's Date: 11/19/2024   History of Present Illness This patient is an 88 year old female, she has recently been treated in the hospital after having a fracture of the left femur, she required surgery, she has a history of atrial fibrillation, she spent a couple of weeks in a rehab facility followed by going home, her daughter and her niece split time at home with her helping her out every single day.  Today she was tried to get to the bedside commode, she slipped and fell bumping her head on her walker, sliding to the ground, her lower back also hit the walker and caused an abrasion on her back.  At this time she has no headache no neck pain no back pain no chest pain, she does complain of a cough for couple of days but does not feel short of breath.  No numbness or weakness in the legs, states that her legs are not hurting at all, paramedics were called as the patient was having difficulty getting to her feet without assistance after surgery    PT Comments  Patient sitting up in a chair on therapist arrival.  States she needs to go to the bathroom. PT assisted her up to RW; needs mod A for initial boost up to standing and then min A to walk to the bathroom.  Needs assist to control sitting on the toilet.  Afterwards; patient needs assist to clean up after toileting; PT assists with changing brief and then patient needs min A with RW to walk back to the bed.  Patient able to sit with CGA on the edge of the bed and then needs mod A for her legs to get back into the bed.  patient left in bed with call button in reach, bed alarm set and nursing notified of mobility status.Patient will benefit from continued skilled therapy services during the remainder of her hospital stay and at the next recommended venue of care to address deficits and promote return to optimal function.        If plan is discharge home,  recommend the following: A lot of help with walking and/or transfers;A lot of help with bathing/dressing/bathroom;Assistance with cooking/housework;Help with stairs or ramp for entrance   Can travel by private vehicle     No  Equipment Recommendations  None recommended by PT    Recommendations for Other Services       Precautions / Restrictions Precautions Precautions: Fall Recall of Precautions/Restrictions: Intact Restrictions Weight Bearing Restrictions Per Provider Order: No     Mobility  Bed Mobility Overal bed mobility: Needs Assistance Bed Mobility: Sit to Supine     Supine to sit: Mod assist, Used rails, Min assist Sit to supine: Mod assist   General bed mobility comments: able to bridge to scoot; needs mod A to return legs to bed    Transfers Overall transfer level: Needs assistance Equipment used: Rolling walker (2 wheels) Transfers: Sit to/from Stand Sit to Stand: Mod assist           General transfer comment: sit to stand with mod A to boost up to standing    Ambulation/Gait Ambulation/Gait assistance: Min assist Gait Distance (Feet): 10 Feet Assistive device: Rolling walker (2 wheels) Gait Pattern/deviations: Shuffle, Decreased step length - right, Decreased step length - left, Step-through pattern, Trunk flexed, Decreased stride length Gait velocity: decreased     General Gait Details: Patient ambulates from chair to  toilet and then to bed.  Needs mod A for boost up to standing from chair and from toilet   Stairs             Wheelchair Mobility     Tilt Bed    Modified Rankin (Stroke Patients Only)       Balance Overall balance assessment: Needs assistance Sitting-balance support: Feet supported, No upper extremity supported Sitting balance-Leahy Scale: Fair Sitting balance - Comments: Seated EOB   Standing balance support: During functional activity, Bilateral upper extremity supported, Reliant on assistive device for  balance Standing balance-Leahy Scale: Fair Standing balance comment: w/ RW                            Communication Communication Communication: No apparent difficulties  Cognition Arousal: Alert Behavior During Therapy: WFL for tasks assessed/performed   PT - Cognitive impairments: No apparent impairments                         Following commands: Intact      Cueing Cueing Techniques: Verbal cues  Exercises      General Comments        Pertinent Vitals/Pain Pain Assessment Pain Assessment: No/denies pain Pain Location: initially denies pain but had some soreness after treatment    Home Living                          Prior Function            PT Goals (current goals can now be found in the care plan section) Acute Rehab PT Goals Patient Stated Goal: return home after rehab PT Goal Formulation: With patient/family Time For Goal Achievement: 11/29/24 Potential to Achieve Goals: Good    Frequency    Min 3X/week      PT Plan      Co-evaluation              AM-PAC PT 6 Clicks Mobility   Outcome Measure  Help needed turning from your back to your side while in a flat bed without using bedrails?: A Little Help needed moving from lying on your back to sitting on the side of a flat bed without using bedrails?: A Lot Help needed moving to and from a bed to a chair (including a wheelchair)?: A Lot Help needed standing up from a chair using your arms (e.g., wheelchair or bedside chair)?: A Lot Help needed to walk in hospital room?: A Lot Help needed climbing 3-5 steps with a railing? : A Lot 6 Click Score: 13    End of Session Equipment Utilized During Treatment: Gait belt Activity Tolerance: Patient tolerated treatment well Patient left: in bed;with call bell/phone within reach;with bed alarm set Nurse Communication: Mobility status PT Visit Diagnosis: Unsteadiness on feet (R26.81);Repeated falls (R29.6);Other  abnormalities of gait and mobility (R26.89);Muscle weakness (generalized) (M62.81);History of falling (Z91.81)     Time: 0940-1006 PT Time Calculation (min) (ACUTE ONLY): 26 min  Charges:    $Therapeutic Activity: 23-37 mins PT General Charges $$ ACUTE PT VISIT: 1 Visit                     11:22 AM, 11/19/24 Akoni Parton Small [name] MPT [place] [facility] [place] [phone] Ph:[phone]

## 2024-11-19 NOTE — Care Management Important Message (Signed)
 Important Message  Patient Details  Name: [name] MRN: [id number] Date of Birth: [birth year]   Important Message Given:  Yes - Medicare IM     Yurika Pereda L Valarie Farace 11/19/2024, 1:14 PM

## 2024-11-19 NOTE — Plan of Care (Signed)

## 2024-11-19 NOTE — TOC Transition Note (Signed)
 Transition of Care [facility]) - Discharge Note   Patient Details  Name: [name] MRN: [id number] Date of Birth: [birth year]  Transition of Care [facility]) CM/SW Contact:  Lucie Lunger, LCSWA Phone Number: 11/19/2024, 12:28 PM   Clinical Narrative:    CSW updated that insurance auth has approved pt for SNF at [facility]. CSW sent D/C clinicals to facility. CSW spoke to [facility] in admissions who confirms they are ready for pt. CSW updated pts daughter of plan for D/C. Room and report numbers provided to RN. TOC signing off.   Final next level of care: Skilled Nursing Facility Barriers to Discharge: Barriers Resolved   Patient Goals and CMS Choice Patient states their goals for this hospitalization and ongoing recovery are:: go to SNF CMS Medicare.gov Compare Post Acute Care list provided to:: Patient Choice offered to / list presented to : Patient, Adult Children [place] ownership interest in [facility].provided to:: Adult Children    Discharge Placement              Patient [facility] bed at: [facility] Patient to be transferred to facility by: facility staff Name of family member notified: daughter Patient and family notified of of transfer: 11/19/24  Discharge Plan and Services Additional resources added to the After Visit Summary for   In-house Referral: Clinical Social Work Discharge Planning Services: CM Consult Post Acute Care Choice: Skilled Nursing Facility                               Social Drivers of Health (SDOH) Interventions SDOH Screenings   Food Insecurity: No Food Insecurity (11/16/2024)  Housing: Low Risk  (11/16/2024)  Transportation Needs: No Transportation Needs (11/16/2024)  Utilities: Not At Risk (11/16/2024)  Depression (PHQ2-9): Low Risk  (08/23/2024)  Physical Activity: Insufficiently Active (08/23/2024)  Social Connections: Socially Isolated (11/16/2024)  Stress: Stress Concern Present (08/23/2024)  Tobacco Use: Low Risk  (11/15/2024)      Readmission Risk Interventions    10/04/2024   10:52 AM 09/30/2024    1:52 PM 09/29/2024    1:57 PM  Readmission Risk Prevention Plan  Post Dischage Appt Complete    Medication Screening Complete Complete Complete  Transportation Screening Complete Complete Complete

## 2024-11-19 NOTE — Discharge Instructions (Signed)
 IMPORTANT INFORMATION: PAY CLOSE ATTENTION   PHYSICIAN DISCHARGE INSTRUCTIONS  Follow with Primary care provider  Grooms, Toni Amend, PA-C  and other consultants as instructed by your Hospitalist Physician  SEEK MEDICAL CARE OR RETURN TO EMERGENCY ROOM IF SYMPTOMS COME BACK, WORSEN OR NEW PROBLEM DEVELOPS   Please note: You were cared for by a hospitalist during your hospital stay. Every effort will be made to forward records to your primary care provider.  You can request that your primary care provider send for your hospital records if they have not received them.  Once you are discharged, your primary care physician will handle any further medical issues. Please note that NO REFILLS for any discharge medications will be authorized once you are discharged, as it is imperative that you return to your primary care physician (or establish a relationship with a primary care physician if you do not have one) for your post hospital discharge needs so that they can reassess your need for medications and monitor your lab values.  Please get a complete blood count and chemistry panel checked by your Primary MD at your next visit, and again as instructed by your Primary MD.  Get Medicines reviewed and adjusted: Please take all your medications with you for your next visit with your Primary MD  Laboratory/radiological data: Please request your Primary MD to go over all hospital tests and procedure/radiological results at the follow up, please ask your primary care provider to get all Hospital records sent to his/her office.  In some cases, they will be blood work, cultures and biopsy results pending at the time of your discharge. Please request that your primary care provider follow up on these results.  If you are diabetic, please bring your blood sugar readings with you to your follow up appointment with primary care.    Please call and make your follow up appointments as soon as possible.    Also  Note the following: If you experience worsening of your admission symptoms, develop shortness of breath, life threatening emergency, suicidal or homicidal thoughts you must seek medical attention immediately by calling 911 or calling your MD immediately  if symptoms less severe.  You must read complete instructions/literature along with all the possible adverse reactions/side effects for all the Medicines you take and that have been prescribed to you. Take any new Medicines after you have completely understood and accpet all the possible adverse reactions/side effects.   Do not drive when taking Pain medications or sleeping medications (Benzodiazepines)  Do not take more than prescribed Pain, Sleep and Anxiety Medications. It is not advisable to combine anxiety,sleep and pain medications without talking with your primary care practitioner  Special Instructions: If you have smoked or chewed Tobacco  in the last 2 yrs please stop smoking, stop any regular Alcohol  and or any Recreational drug use.  Wear Seat belts while driving.  Do not drive if taking any narcotic, mind altering or controlled substances or recreational drugs or alcohol.

## 2024-11-19 NOTE — Plan of Care (Signed)
  Problem: Education: Goal: Knowledge of General Education information will improve Description: Including pain rating scale, medication(s)/side effects and non-pharmacologic comfort measures 11/19/2024 1228 by Mathews Norleen POUR, RN Outcome: Adequate for Discharge 11/19/2024 0929 by Mathews Norleen POUR, RN Outcome: Progressing   Problem: Health Behavior/Discharge Planning: Goal: Ability to manage health-related needs will improve 11/19/2024 1228 by Mathews Norleen POUR, RN Outcome: Adequate for Discharge 11/19/2024 0929 by Mathews Norleen POUR, RN Outcome: Progressing   Problem: Clinical Measurements: Goal: Ability to maintain clinical measurements within normal limits will improve 11/19/2024 1228 by Mathews Norleen POUR, RN Outcome: Adequate for Discharge 11/19/2024 0929 by Mathews Norleen POUR, RN Outcome: Progressing Goal: Will remain free from infection 11/19/2024 1228 by Mathews Norleen POUR, RN Outcome: Adequate for Discharge 11/19/2024 0929 by Mathews Norleen POUR, RN Outcome: Progressing Goal: Diagnostic test results will improve 11/19/2024 1228 by Mathews Norleen POUR, RN Outcome: Adequate for Discharge 11/19/2024 0929 by Mathews Norleen POUR, RN Outcome: Progressing Goal: Respiratory complications will improve 11/19/2024 1228 by Mathews Norleen POUR, RN Outcome: Adequate for Discharge 11/19/2024 0929 by Mathews Norleen POUR, RN Outcome: Progressing Goal: Cardiovascular complication will be avoided 11/19/2024 1228 by Mathews Norleen POUR, RN Outcome: Adequate for Discharge 11/19/2024 0929 by Mathews Norleen POUR, RN Outcome: Progressing   Problem: Activity: Goal: Risk for activity intolerance will decrease 11/19/2024 1228 by Mathews Norleen POUR, RN Outcome: Adequate for Discharge 11/19/2024 0929 by Mathews Norleen POUR, RN Outcome: Progressing   Problem: Nutrition: Goal: Adequate nutrition will be maintained 11/19/2024 1228 by Mathews Norleen POUR, RN Outcome: Adequate for Discharge 11/19/2024 0929 by Mathews Norleen POUR, RN Outcome: Progressing   Problem: Coping: Goal: Level of anxiety  will decrease 11/19/2024 1228 by Mathews Norleen POUR, RN Outcome: Adequate for Discharge 11/19/2024 0929 by Mathews Norleen POUR, RN Outcome: Progressing   Problem: Elimination: Goal: Will not experience complications related to bowel motility 11/19/2024 1228 by Mathews Norleen POUR, RN Outcome: Adequate for Discharge 11/19/2024 0929 by Mathews Norleen POUR, RN Outcome: Progressing Goal: Will not experience complications related to urinary retention 11/19/2024 1228 by Mathews Norleen POUR, RN Outcome: Adequate for Discharge 11/19/2024 0929 by Mathews Norleen POUR, RN Outcome: Progressing   Problem: Pain Managment: Goal: General experience of comfort will improve and/or be controlled 11/19/2024 1228 by Mathews Norleen POUR, RN Outcome: Adequate for Discharge 11/19/2024 0929 by Mathews Norleen POUR, RN Outcome: Progressing   Problem: Safety: Goal: Ability to remain free from injury will improve 11/19/2024 1228 by Mathews Norleen POUR, RN Outcome: Adequate for Discharge 11/19/2024 0929 by Mathews Norleen POUR, RN Outcome: Progressing   Problem: Skin Integrity: Goal: Risk for impaired skin integrity will decrease 11/19/2024 1228 by Mathews Norleen POUR, RN Outcome: Adequate for Discharge 11/19/2024 0929 by Mathews Norleen POUR, RN Outcome: Progressing

## 2024-11-19 NOTE — Discharge Summary (Signed)
 Physician Discharge Summary  [name] FMW:[id number] DOB: [birth year] DOA: 11/15/2024  PCP: Mancil Pfeiffer, PA-C  Admit date: 11/15/2024 Discharge date: 11/19/2024  Disposition:  SNF Rehab  Recommendations for Outpatient Follow-up:  Follow up with PCP in 2 weeks Please obtain BMP/CBC in 1-2 weeks  Home Health: SNF   Discharge Condition: STABLE   CODE STATUS: FULL DIET: Regular    Brief Hospitalization Summary: Please see all hospital notes, images, labs for full details of the hospitalization. Admission provider HPI:  88 y.o. female with medical history significant of history of polycythemia vera, paroxysmal atrial fibrillation, hypertension, hypothyroidism, constipation, recent displaced comminuted fracture of the left femur with admission to a skilled nursing facility after the hospitalization; who has been brought to the emergency department secondary to difficulty walking, back pain, left lower extremity pain, productive intermittent coughing spells and increased frequency.  Patient reports no fevers, no nausea, no vomiting, no chest pain, no hematuria, no melena, no hematochezia or any other complaints.  Symptoms apparently has been present for the last week or so and worsening.  Patient with increased weakness and also decrease appetite.   Workup in the ED suggestive of possible presence of UTI, right upper lobe infiltrate/pneumonia and also thrombocytopenia.  Cultures taken, IV antibiotics has been started and TRH contacted to place patient in the hospital.  Hospital course by listed problems addressed  Generalized Weakness Adult failure to thrive  Staph haemolyticus UTI - With concern for associated pneumonia and UTI - Patient reported no frank dysuria - Culture has been taken:  >100k staphylococcus haemolyticus - Antibiotics and fluid resuscitation provided - Physical therapy evaluation requested and will follow recommendations. - Patient with a recent femur fracture and  short-term rehabilitation but unfortunately continues to struggle and fail recovery after discharge home. -Continue supportive care; TOC has been made aware to assist with placement or any further needs.   Polycythemia Vera  - Appears to be stable -At the moment we will hold Hydrea  as patient hemoglobin and platelet counts are lower than usual baseline. -Continue to follow CBC outpatient.   Hypothyroidism - Continue Synthroid .   HTN (hypertension) - Continue current antihypertensive agents. - Follow vital signs.   CAP (community acquired pneumonia) - Appreciated on chest images at time of admission - Patient also reporting productive coughing spells and intermittent associated shortness of breath. - IV antibiotics, mucolytics, bronchodilator management and low-dose of daily steroids has been initiated - Will follow clinical response.   - pt responded well to supportive care. DC to complete 3 more days of doxycycline    Acute respiratory failure with hypoxia - RESOLVED  - In the setting of pneumonia - Mucolytics, flutter valve, bronchodilator management and IV antibiotics as mentioned above - wean off oxygen to room air.   GERD (gastroesophageal reflux disease) - Continue PPI   Paroxysmal atrial fibrillation - Stable, well-controlled and currently sinus rhythm. - Continue Cardizem /metoprolol  for rate control - Continue Eliquis  for secondary prevention.   Thrombocytopenia - Holding Hydrea  - No overt bleeding appreciated - Will follow platelet counts level. Currently stable, no active bleeding.    Anemia of chronic disease - With elevated ferritin level and low folic acid  - Folic acid  supplementation will be provided - B12 within normal limits - No overt bleeding appreciated.   Discharge Diagnoses:  Principal Problem:   Generalized weakness Active Problems:   Polycythemia vera in remission (HCC)   Hypothyroidism   HTN (hypertension)   CAP (community acquired  pneumonia)   Acute respiratory  failure with hypoxia (HCC)   GERD (gastroesophageal reflux disease)   Paroxysmal atrial fibrillation (HCC)   Thrombocytopenia   Anemia of chronic disease   Discharge Instructions:  Allergies as of 11/19/2024       Reactions   Codeine Nausea And Vomiting   Diovan [valsartan] Itching, Rash   Lamisil [terbinafine] Swelling   Penicillins Rash, Other (See Comments)   Breaking out in whelps        Medication List     TAKE these medications    acetaminophen  325 MG tablet Commonly known as: TYLENOL  Take 2 tablets (650 mg total) by mouth every 6 (six) hours as needed for mild pain (pain score 1-3) or fever (or Fever >/= 101).   apixaban  2.5 MG Tabs tablet Commonly known as: ELIQUIS  Take 1 tablet (2.5 mg total) by mouth 2 (two) times daily.   diltiazem  240 MG 24 hr capsule Commonly known as: CARDIZEM  CD Take 1 capsule (240 mg total) by mouth daily.   doxycycline  100 MG tablet Commonly known as: VIBRA -TABS Take 1 tablet (100 mg total) by mouth every 12 (twelve) hours for 3 days.   fluticasone  50 MCG/ACT nasal spray Commonly known as: FLONASE  Place 2 sprays into both nostrils daily.   folic acid  1 MG tablet Commonly known as: FOLVITE  Take 1 tablet (1 mg total) by mouth daily. Start taking on: November 20, 2024   ipratropium-albuterol  0.5-2.5 (3) MG/3ML Soln Commonly known as: DUONEB Take 3 mLs by nebulization every 6 (six) hours as needed (wheezing, shortness of breath).   levothyroxine  50 MCG tablet Commonly known as: SYNTHROID  Take 1 tablet (50 mcg total) by mouth daily before breakfast.   loratadine  10 MG tablet Commonly known as: CLARITIN  Take 10 mg by mouth daily as needed for allergies.   metoprolol  tartrate 25 MG tablet Commonly known as: LOPRESSOR  Take 1 tablet (25 mg total) by mouth 2 (two) times daily.   pantoprazole  40 MG tablet Commonly known as: PROTONIX  Take 1 tablet (40 mg total) by mouth daily for 7 days. Start  taking on: November 20, 2024   polyethylene glycol 17 g packet Commonly known as: MIRALAX  / GLYCOLAX  Take 17 g by mouth daily.   predniSONE  10 MG tablet Commonly known as: DELTASONE  Take 3 tablets (30 mg total) by mouth daily with breakfast for 4 days. Start taking on: November 20, 2024   senna-docusate 8.6-50 MG tablet Commonly known as: Senokot-S Take 1 tablet by mouth at bedtime.   Vitamin D  (Ergocalciferol ) 1.25 MG (50000 UNIT) Caps capsule Commonly known as: DRISDOL  Take 1 capsule (50,000 Units total) by mouth every 30 (thirty) days.        Contact information for follow-up providers     Grooms, [place]arm[place], [place] Follow up in 2 week(s).   Specialty: Physician Assistant Contact information: [address] [place] [address] [phone]              Contact information for after-discharge care     Destination     [facility] .   Service: Skilled Nursing Contact information: [address] [place]  [address] [phone]                    Allergies  Allergen Reactions   Codeine Nausea And Vomiting   Diovan [Valsartan] Itching and Rash   Lamisil [Terbinafine] Swelling   Penicillins Rash and Other (See Comments)    Breaking out in whelps    Allergies as of 11/19/2024  Reactions   Codeine Nausea And Vomiting   Diovan [valsartan] Itching, Rash   Lamisil [terbinafine] Swelling   Penicillins Rash, Other (See Comments)   Breaking out in whelps        Medication List     TAKE these medications    acetaminophen  325 MG tablet Commonly known as: TYLENOL  Take 2 tablets (650 mg total) by mouth every 6 (six) hours as needed for mild pain (pain score 1-3) or fever (or Fever >/= 101).   apixaban  2.5 MG Tabs tablet Commonly known as: ELIQUIS  Take 1 tablet (2.5 mg total) by mouth 2 (two) times daily.   diltiazem  240 MG 24 hr capsule Commonly known as: CARDIZEM  CD Take 1 capsule (240 mg total)  by mouth daily.   doxycycline  100 MG tablet Commonly known as: VIBRA -TABS Take 1 tablet (100 mg total) by mouth every 12 (twelve) hours for 3 days.   fluticasone  50 MCG/ACT nasal spray Commonly known as: FLONASE  Place 2 sprays into both nostrils daily.   folic acid  1 MG tablet Commonly known as: FOLVITE  Take 1 tablet (1 mg total) by mouth daily. Start taking on: November 20, 2024   ipratropium-albuterol  0.5-2.5 (3) MG/3ML Soln Commonly known as: DUONEB Take 3 mLs by nebulization every 6 (six) hours as needed (wheezing, shortness of breath).   levothyroxine  50 MCG tablet Commonly known as: SYNTHROID  Take 1 tablet (50 mcg total) by mouth daily before breakfast.   loratadine  10 MG tablet Commonly known as: CLARITIN  Take 10 mg by mouth daily as needed for allergies.   metoprolol  tartrate 25 MG tablet Commonly known as: LOPRESSOR  Take 1 tablet (25 mg total) by mouth 2 (two) times daily.   pantoprazole  40 MG tablet Commonly known as: PROTONIX  Take 1 tablet (40 mg total) by mouth daily for 7 days. Start taking on: November 20, 2024   polyethylene glycol 17 g packet Commonly known as: MIRALAX  / GLYCOLAX  Take 17 g by mouth daily.   predniSONE  10 MG tablet Commonly known as: DELTASONE  Take 3 tablets (30 mg total) by mouth daily with breakfast for 4 days. Start taking on: November 20, 2024   senna-docusate 8.6-50 MG tablet Commonly known as: Senokot-S Take 1 tablet by mouth at bedtime.   Vitamin D  (Ergocalciferol ) 1.25 MG (50000 UNIT) Caps capsule Commonly known as: DRISDOL  Take 1 capsule (50,000 Units total) by mouth every 30 (thirty) days.        Procedures/Studies: CT L-SPINE NO CHARGE Result Date: 11/15/2024 CLINICAL DATA:  Back pain.  Provided history of trauma. EXAM: CT LUMBAR SPINE WITHOUT CONTRAST TECHNIQUE: Multidetector CT imaging of the lumbar spine was performed without intravenous contrast administration. Multiplanar CT image reconstructions were also  generated. RADIATION DOSE REDUCTION: This exam was performed according to the departmental dose-optimization program which includes automated exposure control, adjustment of the mA and/or kV according to patient size and/or use of iterative reconstruction technique. COMPARISON:  Lumbar radiograph 11/12/2024 FINDINGS: Segmentation: 5 lumbar type vertebrae.  T12 ribs are diminutive. Alignment: Broad-based S shaped scoliotic curvature.  No listhesis. Vertebrae: Mild superior endplate compression fracture of L4 with 25% loss of height centrally. There is slight irregularity of the superior endplate of L2 without definite loss of height or fracture. No definite L1 compression fracture as questioned on radiograph. The posterior elements are intact. Probable hemangioma within lef right t aspect of L1 vertebra. Paraspinal and other soft tissues: Fully assessed on concurrent abdominopelvic CT, reported separately. Disc levels: Multilevel degenerative disc disease and facet hypertrophy. No  high-grade canal stenosis. IMPRESSION: 1. Mild superior endplate compression fracture of L4 with 25% loss of height centrally. This is unchanged from recent radiograph, age indeterminate. 2. Slight irregularity of the superior endplate of L2 without definite loss of height or fracture. 3. No definite L1 compression fracture as questioned on radiograph. 4. Multilevel degenerative disc disease and facet hypertrophy. Electronically Signed   By: Andrea Gasman M.D.   On: 11/15/2024 21:23   CT T-SPINE NO CHARGE Result Date: 11/15/2024 CLINICAL DATA:  Back pain.  Provided history of fal trauma l. EXAM: CT THORACIC SPINE WITHOUT CONTRAST TECHNIQUE: Multidetector CT images of the thoracic were obtained using the standard protocol without intravenous contrast. RADIATION DOSE REDUCTION: This exam was performed according to the departmental dose-optimization program which includes automated exposure control, adjustment of the mA and/or kV  according to patient size and/or use of iterative reconstruction technique. COMPARISON:  None Available. FINDINGS: Alignment: Exaggerated thoracic kyphosis. Mild broad-based dextroscoliotic curvature. Vertebrae: T12 compression fracture with approximately 20% loss of height, chronic appearing. No evidence of acute fracture. Vertebral body hemangioma within T7. posterior elements are intact. Paraspinal and other soft tissues: Fully assessed on concurrent chest CT, reported separately. Disc levels: No spinal canal or neural foraminal stenosis. IMPRESSION: 1. No acute fracture of the thoracic spine. 2. Chronic appearing T12 compression fracture with approximately 20% loss of height. Electronically Signed   By: Andrea Gasman M.D.   On: 11/15/2024 21:19   CT CHEST ABDOMEN PELVIS W CONTRAST Result Date: 11/15/2024 CLINICAL DATA:  Provided history: Polytrauma, blunt Patient reports low back pain. Patient reports pain since left femur surgery in October. EXAM: CT CHEST, ABDOMEN, AND PELVIS WITH CONTRAST TECHNIQUE: Multidetector CT imaging of the chest, abdomen and pelvis was performed following the standard protocol during bolus administration of intravenous contrast. RADIATION DOSE REDUCTION: This exam was performed according to the departmental dose-optimization program which includes automated exposure control, adjustment of the mA and/or kV according to patient size and/or use of iterative reconstruction technique. CONTRAST:  80mL OMNIPAQUE  IOHEXOL  300 MG/ML  SOLN COMPARISON:  None Available. FINDINGS: CT CHEST FINDINGS Cardiovascular: Aortic atherosclerosis. No aneurysm or acute aortic findings. No central pulmonary embolus on this exam not tailored to pulmonary artery assessment. The heart is enlarged. There are coronary artery calcifications. Trace pericardial effusion. Mediastinum/Nodes: No enlarged mediastinal or hilar lymph nodes. Decompressed esophagus. Subcentimeter bilateral thyroid  nodules. Recommend  thyroid  US  and biopsy (ref: J Am Coll Radiol. 2015 Feb;12(2): 143-50). Lungs/Pleura: Moderate right and small to moderate left pleural effusion. Associated compressive atelectasis. Smooth septal thickening typical of pulmonary edema. Areas of patchy ground-glass opacity within the anterior upper lobes. No debris in the trachea or central bronchi. Benign calcified granuloma in the right lower lobe. Musculoskeletal: No acute fracture of the ribs, sternum, included clavicles or shoulder girdles. Generalized body wall edema but no confluent hematoma. Thoracic spine assessed on concurrent thoracic spine reformats, reported separately. CT ABDOMEN PELVIS FINDINGS Hepatobiliary: No hepatic injury or perihepatic hematoma. Gallbladder is unremarkable. Pancreas: No ductal dilatation or inflammation. No evidence of injury. Spleen: No splenic injury. The spleen is enlarged spanning 14.4 cm AP. No focal splenic abnormality. Adrenals/Urinary Tract: No adrenal hemorrhage or renal injury identified. Lobulated bilateral renal contours. No suspicious renal lesion. Portions of the urinary bladder obscured by streak artifact from left hip arthroplasty. Obvious bladder injury. Stomach/Bowel: Decompressed stomach. No obvious bowel injury or mesenteric hematoma. No bowel wall thickening. Left colonic diverticulosis. No diverticulitis. Vascular/Lymphatic: Aortic atherosclerosis. No aortic or vascular injury.  Retroaortic left renal vein. No suspicious lymphadenopathy Reproductive: Pessary in place. Atrophic uterus potentially visualized. No adnexal mass. Other: Generalized edema of the subcutaneous tissues. Mild edema of the intra-abdominal fat. Presacral edema without hemorrhage. No free air. Musculoskeletal: Lumbar spine assessed on concurrent lumbar spine reformats, reported separately. Left hip arthroplasty is intact were visualized. The bones are subjectively under mineralized. Focal osteopenia versus lucent lesion within the left iliac  bone, series 3, image 99. Remote healed fractures of right pubic rami. Remote right sacral fracture. IMPRESSION: 1. No evidence of acute traumatic injury to the chest, abdomen, or pelvis. 2. Moderate right and small to moderate left pleural effusions with associated compressive atelectasis. 3. Smooth septal thickening typical of pulmonary edema. Areas of patchy ground-glass opacity within the anterior upper lobes may be related to pulmonary edema or pneumonia. 4. Generalized body wall edema. 5. Splenomegaly. 6. Colonic diverticulosis without diverticulitis. 7. Focal osteopenia versus lucent lesion within the left iliac bone. Recommend correlation with any history of malignancy. This could be further assessed with MRI as clinically indicated. Aortic Atherosclerosis (ICD10-I70.0). Electronically Signed   By: Andrea Gasman M.D.   On: 11/15/2024 21:15   CT Head Wo Contrast Result Date: 11/15/2024 EXAM: CT HEAD WITHOUT 11/15/2024 08:24:59 PM TECHNIQUE: CT of the head was performed without the administration of intravenous contrast. Automated exposure control, iterative reconstruction, and/or weight based adjustment of the mA/kV was utilized to reduce the radiation dose to as low as reasonably achievable. COMPARISON: 01/11/2024 CLINICAL HISTORY: Head trauma, minor (Age >= 65y) FINDINGS: BRAIN AND VENTRICLES: No acute intracranial hemorrhage. No mass effect or midline shift. No extra-axial fluid collection. No evidence of acute infarct. No hydrocephalus. Patchy periventricular and subcortical white matter low-density changes compatible with chronic microvascular ischemic change. Mild diffuse cerebral volume loss. Low lying cerebellar tonsils. Atherosclerotic calcifications in large vessels of skull base. ORBITS: No acute abnormality. SINUSES AND MASTOIDS: No acute abnormality. SOFT TISSUES AND SKULL: No acute skull fracture. No acute soft tissue abnormality. There are numerous new calvarial lucencies compared to  01/11/2024. IMPRESSION: 1. No acute intracranial abnormality 2. Numerous new calvarial lucencies compared to 01/11/24. This is a nonspecific finding that can be seen in multiple myeloma. Recommend correlation with laboratory values. Electronically signed by: Franky Stanford MD 11/15/2024 08:38 PM EST RP Workstation: [id number]   CT Cervical Spine Wo Contrast Result Date: 11/15/2024 EXAM: CT CERVICAL SPINE WITHOUT CONTRAST 11/15/2024 08:24:59 PM TECHNIQUE: CT of the cervical spine was performed without the administration of intravenous contrast. Multiplanar reformatted images are provided for review. Automated exposure control, iterative reconstruction, and/or weight based adjustment of the mA/kV was utilized to reduce the radiation dose to as low as reasonably achievable. COMPARISON: None available. CLINICAL HISTORY: Neck trauma (Age >= 65y) FINDINGS: CERVICAL SPINE: BONES AND ALIGNMENT: No acute fracture or traumatic malalignment. DEGENERATIVE CHANGES: No significant degenerative changes. SOFT TISSUES: No prevertebral soft tissue swelling. LUNGS: Bilateral pleural effusions. IMPRESSION: 1. No acute abnormality of the cervical spine. 2. Bilateral pleural effusions. Electronically signed by: Franky Stanford MD 11/15/2024 08:31 PM EST RP Workstation: [id number]   DG Lumbar Spine Complete Result Date: 11/12/2024 CLINICAL DATA:  Status post fall EXAM: LUMBAR SPINE - COMPLETE 5 VIEW COMPARISON:  Lumbar spine radiograph dated 02/30/2012 FINDINGS: Age indeterminate superior endplate compression of L1, L2, and L4. Unchanged T11 compression fracture. Levoscoliosis of the lumbar spine. No spondylolisthesis. Intervertebral disc spaces are maintained. IMPRESSION: 1. Age indeterminate superior endplate compression of L1, L2, and L4. 2. Unchanged T11 compression fracture. 3.  Lumbar levoscoliosis.  No spondylolisthesis. Electronically Signed   By: Limin  Xu M.D.   On: 11/12/2024 19:02   DG Chest 2 View Result Date:  11/12/2024 CLINICAL DATA:  Cough EXAM: CHEST - 2 VIEW COMPARISON:  Chest x-ray 10/04/2024 FINDINGS: There are small bilateral pleural effusions. There are atelectatic changes in the lung bases. The heart is mildly enlarged. There is central pulmonary vascular congestion. No pneumothorax or acute fracture identified. IMPRESSION: Cardiomegaly with central pulmonary vascular congestion and small bilateral pleural effusions. Electronically Signed   By: Greig Pique M.D.   On: 11/12/2024 19:00     Subjective: Pt sitting up in chair, breathing is much better, no complaints. Agreeable to SNF rehab therapy and transfer.   Discharge Exam: Vitals:   11/18/24 1955 11/19/24 0322  BP: (!) 127/57 133/64  Pulse: 62 65  Resp:    Temp: (!) 97.5 F (36.4 C) 98.4 F (36.9 C)  SpO2: 94% 92%   Vitals:   11/18/24 0333 11/18/24 1245 11/18/24 1955 11/19/24 0322  BP: 122/63 (!) 125/58 (!) 127/57 133/64  Pulse: 64 (!) 56 62 65  Resp: 18     Temp: 98.3 F (36.8 C) 98.4 F (36.9 C) (!) 97.5 F (36.4 C) 98.4 F (36.9 C)  TempSrc: Oral Oral Oral Oral  SpO2: 97% 94% 94% 92%  Weight: 57.3 kg     Height:       General exam: elderly frail female, awake, alert, oriented x 3. Appears calm and comfortable  Respiratory system: Clear to auscultation. Respiratory effort normal. Cardiovascular system: normal S1 & S2 heard. No JVD, murmurs, rubs, gallops or clicks. No pedal edema. Gastrointestinal system: Abdomen is nondistended, soft and nontender. No organomegaly or masses felt. Normal bowel sounds heard. Central nervous system: Alert and oriented. No focal neurological deficits. Extremities: Symmetric 5 x 5 power. Skin: No rashes, lesions or ulcers. Psychiatry: Judgement and insight appear normal. Mood & affect appropriate.   The results of significant diagnostics from this hospitalization (including imaging, microbiology, ancillary and laboratory) are listed below for reference.     Microbiology: Recent  Results (from the past 240 hours)  Urine Culture     Status: Abnormal   Collection Time: 11/15/24 11:09 AM   Specimen: Urine, Clean Catch  Result Value Ref Range Status   Specimen Description   Final    URINE, CLEAN CATCH Performed at [facility], [address] [place], [place] [address]    Special Requests   Final    NONE Performed at [facility], [address], [place], [place] [address]    Culture >=100,000 COLONIES/mL STAPHYLOCOCCUS HAEMOLYTICUS (A)  Final   Report Status 11/17/2024 FINAL  Final   Organism ID, Bacteria STAPHYLOCOCCUS HAEMOLYTICUS (A)  Final      Susceptibility   Staphylococcus haemolyticus - MIC*    CIPROFLOXACIN >=8 RESISTANT Resistant     GENTAMICIN >=16 RESISTANT Resistant     NITROFURANTOIN <=16 SENSITIVE Sensitive     OXACILLIN >=4 RESISTANT Resistant     TETRACYCLINE <=1 SENSITIVE Sensitive     VANCOMYCIN  1 SENSITIVE Sensitive     TRIMETH/SULFA 160 RESISTANT Resistant     RIFAMPIN >=32 RESISTANT Resistant     Inducible Clindamycin  NEGATIVE Sensitive     * >=100,000 COLONIES/mL STAPHYLOCOCCUS HAEMOLYTICUS     Labs: BNP (last 3 results) No results for input(s): BNP in the last 8760 hours. Basic Metabolic Panel: Recent Labs  Lab 11/15/24 1147 11/16/24 0419 11/17/24 0414  NA 134* 133* 131*  K 3.8 4.1 4.0  CL 101 100 100  CO2 22 24 23   GLUCOSE 110* 94 106*  BUN 15 13 16   CREATININE 0.56 0.53 0.55  CALCIUM 8.0* 8.0* 7.8*  MG  --  2.1  --   PHOS  --  3.9  --    Liver Function Tests: Recent Labs  Lab 11/15/24 1147 11/16/24 0419  AST 34 34  ALT 7 8  ALKPHOS 250* 224*  BILITOT 0.6 0.7  PROT 5.7* 5.6*  ALBUMIN 2.8* 2.6*   No results for input(s): LIPASE, AMYLASE in the last 168 hours. No results for input(s): AMMONIA in the last 168 hours. CBC: Recent Labs  Lab 11/15/24 1147 11/16/24 0419 11/17/24 0414 11/19/24 0407  WBC 9.4 8.8 12.4* 15.5*  NEUTROABS 6.2  --   --   --   HGB 8.7* 8.5* 7.8* 8.1*  HCT 28.3* 27.5* 25.1*  25.9*  MCV 82.3 81.4 81.2 80.7  PLT 94* 90* 89* 95*   Cardiac Enzymes: No results for input(s): CKTOTAL, CKMB, CKMBINDEX, TROPONINI in the last 168 hours. BNP: Invalid input(s): POCBNP CBG: Recent Labs  Lab 11/19/24 0725  GLUCAP 113*   D-Dimer No results for input(s): DDIMER in the last 72 hours. Hgb A1c No results for input(s): HGBA1C in the last 72 hours. Lipid Profile No results for input(s): CHOL, HDL, LDLCALC, TRIG, CHOLHDL, LDLDIRECT in the last 72 hours. Thyroid  function studies No results for input(s): TSH, T4TOTAL, T3FREE, THYROIDAB in the last 72 hours.  Invalid input(s): FREET3 Anemia work up No results for input(s): VITAMINB12, FOLATE, FERRITIN, TIBC, IRON, RETICCTPCT in the last 72 hours. Urinalysis    Component Value Date/Time   COLORURINE YELLOW 11/15/2024 1109   APPEARANCEUR HAZY (A) 11/15/2024 1109   LABSPEC 1.017 11/15/2024 1109   PHURINE 5.0 11/15/2024 1109   GLUCOSEU NEGATIVE 11/15/2024 1109   HGBUR MODERATE (A) 11/15/2024 1109   BILIRUBINUR NEGATIVE 11/15/2024 1109   KETONESUR NEGATIVE 11/15/2024 1109   PROTEINUR NEGATIVE 11/15/2024 1109   UROBILINOGEN 0.2 03/06/2009 1313   NITRITE NEGATIVE 11/15/2024 1109   LEUKOCYTESUR LARGE (A) 11/15/2024 1109   Sepsis Labs Recent Labs  Lab 11/15/24 1147 11/16/24 0419 11/17/24 0414 11/19/24 0407  WBC 9.4 8.8 12.4* 15.5*   Microbiology Recent Results (from the past 240 hours)  Urine Culture     Status: Abnormal   Collection Time: 11/15/24 11:09 AM   Specimen: Urine, Clean Catch  Result Value Ref Range Status   Specimen Description   Final    URINE, CLEAN CATCH Performed at [facility], [address], [place], [place] [address]    Special Requests   Final    NONE Performed at [facility], [address] [place], [place] [address]    Culture >=100,000 COLONIES/mL STAPHYLOCOCCUS HAEMOLYTICUS (A)  Final   Report Status 11/17/2024 FINAL  Final    Organism ID, Bacteria STAPHYLOCOCCUS HAEMOLYTICUS (A)  Final      Susceptibility   Staphylococcus haemolyticus - MIC*    CIPROFLOXACIN >=8 RESISTANT Resistant     GENTAMICIN >=16 RESISTANT Resistant     NITROFURANTOIN <=16 SENSITIVE Sensitive     OXACILLIN >=4 RESISTANT Resistant     TETRACYCLINE <=1 SENSITIVE Sensitive     VANCOMYCIN  1 SENSITIVE Sensitive     TRIMETH/SULFA 160 RESISTANT Resistant     RIFAMPIN >=32 RESISTANT Resistant     Inducible Clindamycin  NEGATIVE Sensitive     * >=100,000 COLONIES/mL STAPHYLOCOCCUS HAEMOLYTICUS    Time coordinating discharge:  33 mins  SIGNED:  Afton Louder, MD  [facility] 11/19/2024, 11:57 AM How to contact the [facility] Attending or Consulting provider 7A - 7P or covering provider during after hours 7P -7A, for this patient?  Check the care team in [facility] and look for a) attending/consulting [facility] provider listed and b) the [facility] team listed Log into [email or web address] and use [facility] universal password to access. If you do not have the password, please contact the hospital operator. Locate the [facility] provider you are looking for under [facility] and page to a number that you can be directly reached. If you still have difficulty reaching the provider, please page the [facility] (Director on Call) for the Hospitalists listed on amion for assistance.

## 2024-11-22 ENCOUNTER — Ambulatory Visit: Admitting: Physician Assistant

## 2024-11-23 ENCOUNTER — Non-Acute Institutional Stay (SKILLED_NURSING_FACILITY): Payer: Self-pay | Admitting: Adult Health

## 2024-11-23 ENCOUNTER — Encounter: Payer: Self-pay | Admitting: Adult Health

## 2024-11-23 ENCOUNTER — Ambulatory Visit: Admitting: Orthopedic Surgery

## 2024-11-23 DIAGNOSIS — K219 Gastro-esophageal reflux disease without esophagitis: Secondary | ICD-10-CM

## 2024-11-23 DIAGNOSIS — E538 Deficiency of other specified B group vitamins: Secondary | ICD-10-CM | POA: Insufficient documentation

## 2024-11-23 DIAGNOSIS — J189 Pneumonia, unspecified organism: Secondary | ICD-10-CM

## 2024-11-23 DIAGNOSIS — J3089 Other allergic rhinitis: Secondary | ICD-10-CM

## 2024-11-23 DIAGNOSIS — E43 Unspecified severe protein-calorie malnutrition: Secondary | ICD-10-CM | POA: Insufficient documentation

## 2024-11-23 DIAGNOSIS — D696 Thrombocytopenia, unspecified: Secondary | ICD-10-CM

## 2024-11-23 DIAGNOSIS — I480 Paroxysmal atrial fibrillation: Secondary | ICD-10-CM

## 2024-11-23 DIAGNOSIS — K5909 Other constipation: Secondary | ICD-10-CM | POA: Insufficient documentation

## 2024-11-23 DIAGNOSIS — E039 Hypothyroidism, unspecified: Secondary | ICD-10-CM

## 2024-11-23 DIAGNOSIS — D45 Polycythemia vera: Secondary | ICD-10-CM

## 2024-11-23 DIAGNOSIS — N390 Urinary tract infection, site not specified: Secondary | ICD-10-CM | POA: Insufficient documentation

## 2024-11-23 NOTE — Progress Notes (Signed)
 Location:  [facility] Room Number: 131 Place of Service:  SNF (31)   CODE STATUS: full   Allergies  Allergen Reactions   Codeine Nausea And Vomiting   Diovan [Valsartan] Itching and Rash   Lamisil [Terbinafine] Swelling   Penicillins Rash and Other (See Comments)    Breaking out in whelps     Chief Complaint  Patient presents with   Hospitalization Follow-up    HPI:  She is a 88 year old woman who has been hospitalized from 11-15-24 through 11-19-24. Her past medical history includes: polycythemia vera; paroxsymal atrial fibrillation recent displaced comminuted fracture of the left femur with admission to a skilled nursing facility after the hospitalization. She presented to the ED with difficulty ambulating with coughing with increased frequency. There were no reports of fever; no chest pain or other complaints. Did have decreased appetite.  The workup in the ED was suggestive of possible UTI, right upper infiltrate/pneumonia and thrombocytopenia. Did have cultures done and was started on IV abt.  She was treated for staph haemolyticus UTI  with >100,000 colonies with pneumonia. She is now wearing 02.  Polycythemia Vera the hydrea  is being held at this time. Will follow up cbc.  She here for short term rehab; with her goal to return back home. Will continue to follow her chronic illnesses including:     Gastroesophageal reflux disease without esophagitis:    Acquired hypothyroidism:  Thrombocytopenia:  Past Medical History:  Diagnosis Date   Hypertension    Influenza 11/2012   Osteoporosis    Polycythemia    Polycythemia vera(238.4) 02/25/2013   Diagnosed August 2005 on Hydrea  500 mg 3 times a week with excellent control.    Prolapse, uterus, congenital    ring placement    Past Surgical History:  Procedure Laterality Date   APPENDECTOMY     COLONOSCOPY     FEMUR IM NAIL Left 09/30/2024   Procedure: INSERTION, INTRAMEDULLARY ROD, FEMUR,  RETROGRADE;  Surgeon: Onesimo Oneil LABOR, MD;  Location: [facility];  Service: Orthopedics;  Laterality: Left;   INCISION AND DRAINAGE Left 01/21/2022   Procedure: INCISION AND DRAINAGE LEFT INDEX FINGER;  Surgeon: Murrell Drivers, MD;  Location:  SURGERY CENTER;  Service: Orthopedics;  Laterality: Left;   OOPHORECTOMY     left   opperectomy     ORIF RADIAL FRACTURE Left 12/27/2022   Procedure: OPEN REDUCTION INTERNAL FIXATION (ORIF) RADIAL and ULNAR FRACTURE;  Surgeon: Margrette Taft BRAVO, MD;  Location: [facility];  Service: Orthopedics;  Laterality: Left;   REVISION TOTAL HIP ARTHROPLASTY Left     Social History   Socioeconomic History   Marital status: Divorced    Spouse name: Not on file   Number of children: 1   Years of education: Not on file   Highest education level: 11th grade  Occupational History   Not on file  Tobacco Use   Smoking status: Never   Smokeless tobacco: Never  Vaping Use   Vaping status: Never Used  Substance and Sexual Activity   Alcohol use: No   Drug use: No   Sexual activity: Yes    Birth control/protection: Post-menopausal  Other Topics Concern   Not on file  Social History Narrative   Not on file   Social Drivers of Health   Financial Resource Strain: Not on file  Food Insecurity: No Food Insecurity (11/16/2024)   Hunger Vital Sign    Worried About Running Out of Food in the Last  Year: Never true    Ran Out of Food in the Last Year: Never true  Transportation Needs: No Transportation Needs (11/16/2024)   PRAPARE - Administrator, Civil Service (Medical): No    Lack of Transportation (Non-Medical): No  Physical Activity: Insufficiently Active (08/23/2024)   Exercise Vital Sign    Days of Exercise per Week: 4 days    Minutes of Exercise per Session: 30 min  Stress: Stress Concern Present (08/23/2024)   [employer] of Occupational Health - Occupational Stress Questionnaire    Feeling of Stress: To some extent  Social Connections:  Socially Isolated (11/16/2024)   Social Connection and Isolation Panel    Frequency of Communication with Friends and Family: More than three times a week    Frequency of Social Gatherings with Friends and Family: Once a week    Attends Religious Services: Never    Database Administrator or Organizations: No    Attends Banker Meetings: Never    Marital Status: Divorced  Catering Manager Violence: Not At Risk (11/16/2024)   Humiliation, Afraid, Rape, and Kick questionnaire    Fear of Current or Ex-Partner: No    Emotionally Abused: No    Physically Abused: No    Sexually Abused: No   Family History  Problem Relation Age of Onset   Cancer Brother        throat cancer   Cancer Brother        lung cancer   Cancer Brother        prostate cancer      VITAL SIGNS BP 125/62   Pulse (!) 56   Temp (!) 97.5 F (36.4 C)   Resp 18   Ht 5' 1 (1.549 m)   Wt 129 lb 3.2 oz (58.6 kg)   SpO2 100%   BMI 24.41 kg/m   Outpatient Encounter Medications as of 11/23/2024  Medication Sig   sennosides-docusate sodium  (SENOKOT-S) 8.6-50 MG tablet Take 1 tablet by mouth daily.   acetaminophen  (TYLENOL ) 325 MG tablet Take 2 tablets (650 mg total) by mouth every 6 (six) hours as needed for mild pain (pain score 1-3) or fever (or Fever >/= 101).   apixaban  (ELIQUIS ) 2.5 MG TABS tablet Take 1 tablet (2.5 mg total) by mouth 2 (two) times daily.   diltiazem  (CARDIZEM  CD) 240 MG 24 hr capsule Take 1 capsule (240 mg total) by mouth daily.   fluticasone  (FLONASE ) 50 MCG/ACT nasal spray Place 2 sprays into both nostrils daily.   folic acid  (FOLVITE ) 1 MG tablet Take 1 tablet (1 mg total) by mouth daily.   ipratropium-albuterol  (DUONEB) 0.5-2.5 (3) MG/3ML SOLN Take 3 mLs by nebulization every 6 (six) hours as needed (wheezing, shortness of breath).   levothyroxine  (SYNTHROID ) 50 MCG tablet Take 1 tablet (50 mcg total) by mouth daily before breakfast.   metoprolol  tartrate (LOPRESSOR ) 25 MG tablet  Take 1 tablet (25 mg total) by mouth 2 (two) times daily.   pantoprazole  (PROTONIX ) 40 MG tablet Take 1 tablet (40 mg total) by mouth daily for 7 days.   predniSONE  (DELTASONE ) 10 MG tablet Take 3 tablets (30 mg total) by mouth daily with breakfast for 4 days.   Vitamin D , Ergocalciferol , (DRISDOL ) 1.25 MG (50000 UNIT) CAPS capsule Take 1 capsule (50,000 Units total) by mouth every 30 (thirty) days.   No facility-administered encounter medications on file as of 11/23/2024.     SIGNIFICANT DIAGNOSTIC EXAMS  LABS REVIEWED:   11-15-24: wbc  9,4; hgb 8.7; hct 28.3; mcv 82.3 plt 94; glucose 110; bun 15; creat 0.56; k+ 3.8; na++ 134; ca 8.0; gfr >60; protein 5.7 albumin 2.8; alk phos 250; vitamin B12: 1456; folic 4.8; iron 46; tibc 181 11-19-24: wbc 15.5; hgb 8.1; hct 25.9; mcv 80.7 plt 95   Review of Systems  Constitutional:  Positive for malaise/fatigue.  Respiratory:  Positive for cough, sputum production and shortness of breath.        Using 02  Cardiovascular:  Negative for chest pain, palpitations and leg swelling.  Gastrointestinal:  Negative for abdominal pain, constipation and heartburn.  Musculoskeletal:  Negative for back pain, joint pain and myalgias.  Skin: Negative.   Neurological:  Positive for weakness. Negative for dizziness.  Psychiatric/Behavioral:  The patient is not nervous/anxious.     Physical Exam Constitutional:      General: She is not in acute distress.    Appearance: She is well-developed. She is not diaphoretic.  Neck:     Thyroid : No thyromegaly.  Cardiovascular:     Rate and Rhythm: Normal rate. Rhythm irregular.     Pulses: Normal pulses.     Heart sounds: Normal heart sounds.  Pulmonary:     Effort: Pulmonary effort is normal. No respiratory distress.     Breath sounds: Normal breath sounds.  Abdominal:     General: Bowel sounds are normal. There is no distension.     Palpations: Abdomen is soft.     Tenderness: There is no abdominal tenderness.   Musculoskeletal:        General: Normal range of motion.     Cervical back: Neck supple.     Right lower leg: No edema.     Left lower leg: No edema.  Lymphadenopathy:     Cervical: No cervical adenopathy.  Skin:    General: Skin is warm and dry.  Neurological:     Mental Status: She is alert. Mental status is at baseline.  Psychiatric:        Mood and Affect: Mood normal.     ASSESSMENT/ PLAN:  TODAY  Acute UTI: has completed abt and will continue to monitor her status  2. Community acquired pneumonia unspecified laterality/acute respiratory failure Chronic nonseasonal allergic rhinitis: has completed abt; will continue prednisone  30 mg daily through 11-24-24; duoneb every 6 hours as needed; flonase  2 sprays daily  3. Gastroesophageal reflux disease without esophagitis: will continue protonix  40 mg daily  4. Acquired hypothyroidism: will continue synthroid  50 mcg daily   5. Thrombocytopenia: plt count is 95,000  6. Paroxsymal atrial fibrillation: heart rate is stable; will continue lopressor  25 mg twice daily and cardizem  cd 240 mg daily for rate control and eliquis  2.5 mg twice daily   7. Chronic constipation: will continue senna s nightly   8. Folic acid  deficiency: will continue folic acid  1 mg daily   9. Polycythemia vera in remission: wbc 15.5; hgb 8.1; hct 25.9 plt 95 hydrea  was stopped in the hospital  10. Severe protein calorie malnutrition: albumin 2.8 will begin prosource 30 mL twice daily    Barnie Seip NP [facility]   call [phone]

## 2024-11-24 ENCOUNTER — Encounter: Payer: Self-pay | Admitting: Internal Medicine

## 2024-11-24 ENCOUNTER — Non-Acute Institutional Stay (SKILLED_NURSING_FACILITY): Admitting: Internal Medicine

## 2024-11-24 ENCOUNTER — Inpatient Hospital Stay

## 2024-11-24 DIAGNOSIS — J189 Pneumonia, unspecified organism: Secondary | ICD-10-CM | POA: Diagnosis not present

## 2024-11-24 DIAGNOSIS — N390 Urinary tract infection, site not specified: Secondary | ICD-10-CM | POA: Diagnosis not present

## 2024-11-24 DIAGNOSIS — D72828 Other elevated white blood cell count: Secondary | ICD-10-CM

## 2024-11-24 DIAGNOSIS — D696 Thrombocytopenia, unspecified: Secondary | ICD-10-CM

## 2024-11-24 DIAGNOSIS — I480 Paroxysmal atrial fibrillation: Secondary | ICD-10-CM

## 2024-11-24 NOTE — Assessment & Plan Note (Signed)
 GU symptoms denied. S/P full course of antibiotics.

## 2024-11-24 NOTE — Progress Notes (Signed)
 NURSING HOME LOCATION:  [facility] ROOM NUMBER:  131 P  CODE STATUS:  Full Code  PCP: Charmaine Grooms ,PA-C  This is a comprehensive admission note to this SNFperformed on this date less than 30 days from date of admission. Included are preadmission medical/surgical history; reconciled medication list; family history; social history and comprehensive review of systems.  Corrections and additions to the records were documented. Comprehensive physical exam was also performed. Additionally a clinical summary was entered for each active diagnosis pertinent to this admission in the Problem List to enhance continuity of care.  HPI: She was hospitalized 12/1 - 11/20/2019 with what clinically appeared to be possible bilateral upper lobe pneumonia and UTI.  She hads described increasing chest congestion with cough over 3 to 4 days PTA .  CT of the chest revealed aortic atherosclerosis, cardiomegaly, coronary artery calcifications, moderate right and small-moderate left pleural effusions with associated compressive atelectasis, and patchy ground glass opacities within the anterior upper lobes.  Differential was pulmonary edema versus community-acquired pneumonia.  Initial white count was 9400; peak white count was 15,500.  Empiric antibiotics were initiated.  Urine culture was collected and this revealed greater than 100,000 staph haemolyticus.  Anemia was present with H/H of 7.8/25.1; final H/H was 8.1/25.9.  She had been on Hydrea  for polycythemia vera but this was held because of the anemia.  Platelet count ranged from 89,000-95,000.  Mild hyponatremia was present with a range of 131 up to 134.  Mild hyperglycemia was noted with glucoses ranging from 94-110.  Calcium was 7.8. Clinically there was stabilization and improvement with empiric antibiotics, mucolytics, low-dose steroids, and flutter valve. She was transitioned to 3 additional days of doxycycline  at the time of discharge. PT/OT  consulted and recommended SNF placement for rehab.  Past medical and surgical history: Includes GERD, PAF, thrombocytopenia, essential hypertension, and osteoporosis. Surgeries and procedures include colonoscopy, femur IM nailing 09/30/2024, oophorectomy, ORIF radial fracture repair, and revision THA.  Family history: reviewed, non contributory due to advanced age.  Social history:non drinker ; non smoker   Review of systems: Staff reports she is nonambulatory and requires moderate assist with transfers and bed mobility.  Upper body ADLs with CGA and lower body ADLs with maximum assist documented by PT/OT. When asked the reason for admission she stated had pneumonia, all choked up.  She does describe a cough for at least 4 days PTA which was essentially nonproductive.  Subsequently she has been producing sputum which she describes as clear.  She feels her breathing is much improved.  Constitutional: No fever, significant weight change Eyes: No redness, discharge, pain, vision change ENT/mouth: No nasal congestion, purulent discharge, earache, change in hearing, sore throat  Cardiovascular: No chest pain, palpitations, paroxysmal nocturnal dyspnea, claudication, edema  Respiratory: No hemoptysis, significant snoring, apnea  Gastrointestinal: No heartburn, dysphagia, abdominal pain, nausea /vomiting, rectal bleeding, melena, change in bowels Genitourinary: No dysuria, hematuria, pyuria, incontinence, nocturia Musculoskeletal: No joint stiffness, joint swelling, weakness, pain Dermatologic: No rash, pruritus, change in appearance of skin Neurologic: No dizziness, headache, syncope, seizures, numbness, tingling Psychiatric: No significant anxiety, depression, insomnia, anorexia Endocrine: No change in hair/skin/nails, excessive thirst, excessive hunger, excessive urination  Hematologic/lymphatic: No significant bruising, lymphadenopathy, abnormal bleeding Allergy/immunology: No itchy/watery  eyes, significant sneezing, urticaria, angioedema  Physical exam:  Pertinent or positive findings: She appears her age and somewhat chronically ill.  Hair is disheveled.  She was initially asleep exhibiting hypopnea without snoring or frank apnea.  Eyebrows  are decreased laterally.  She is wearing nasal oxygen.  She is not wearing her upper plate.  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  There is some malalignment and gaps of the mandibular teeth.  Heart rhythm is irregular but rate controlled.  Breath sounds are decreased with minor low-grade rales at the bases.  Dorsalis pedis pulses are stronger than posterior tibial pulses.  She has 1+ edema of the feet.  Limb atrophy & IOW present.  General appearance: no acute distress, increased work of breathing is present.   Lymphatic: No lymphadenopathy about the head, neck, axilla. Eyes: No conjunctival inflammation or lid edema is present. There is no scleral icterus. Ears:  External ear exam shows no significant lesions or deformities.   Nose:  External nasal examination shows no deformity or inflammation. Nasal mucosa are pink and moist without lesions, exudates Neck:  No thyromegaly, masses, tenderness noted.    Heart:  No gallop, murmur, click, rub.  Lungs: without wheezes, rhonchi, rubs. Abdomen: Bowel sounds are normal.  Abdomen is soft and nontender with no organomegaly, hernias, masses. GU: Deferred  Extremities:  No cyanosis, clubbing. Neurologic exam:  Balance, Rhomberg, finger to nose testing could not be completed due to clinical state Skin: Warm & dry w/o tenting. No significant lesions or rash.  See clinical summary under each active problem in the Problem List with associated updated therapeutic plan :  Thrombocytopenia 12/1-12/5/25 TCP range 89,000-95,000. Monitor for bleeding dyscrasias.  Leukocytosis 12/1-12/5/25 WBC range 9.4- final value 15.5. Presently afebrile; full course of antibiotics completed.  Paroxysmal atrial  fibrillation (HCC) Current rhythm is irregular , but rate well controlled.  Acute UTI GU symptoms denied. S/P full course of antibiotics.  CAP (community acquired pneumonia) S/P full course of parenteral and oral antibiotics and aggressive pulmonary toilet regimen with clinical stabilization and improvement.  She remains afebrile and states that her dyspnea has improved.  She remains on nasal O2.

## 2024-11-24 NOTE — Assessment & Plan Note (Signed)
 Current rhythm is irregular , but rate well controlled.

## 2024-11-24 NOTE — Assessment & Plan Note (Signed)
 12/1-12/5/25 TCP range 89,000-95,000. Monitor for bleeding dyscrasias.

## 2024-11-24 NOTE — Assessment & Plan Note (Addendum)
 12/1-12/5/25 WBC range 9.4- final value 15.5. Presently afebrile; full course of antibiotics completed.

## 2024-11-24 NOTE — Patient Instructions (Signed)
 See assessment and plan under each diagnosis in the problem list and acutely for this visit

## 2024-11-25 ENCOUNTER — Encounter: Payer: Self-pay | Admitting: Adult Health

## 2024-11-25 ENCOUNTER — Non-Acute Institutional Stay (SKILLED_NURSING_FACILITY): Payer: Self-pay | Admitting: Adult Health

## 2024-11-25 NOTE — Progress Notes (Signed)
 Location:  [facility] Room Number: 131 Place of Service:  SNF (31)   CODE STATUS: full   Allergies[1]  Chief Complaint  Patient presents with   Acute Visit    Bilateral lower extremity edema     HPI:  Her family has asked that she be seen for her bilateral lower extremity edema. She denies any chest pain or shortness of breath. She tells me that she is hanging her feet more than she did at home. She denies any lower extremity pain.   Past Medical History:  Diagnosis Date   Hypertension    Influenza 11/2012   Osteoporosis    Polycythemia    Polycythemia vera(238.4) 02/25/2013   Diagnosed August 2005 on Hydrea  500 mg 3 times a week with excellent control.    Prolapse, uterus, congenital    ring placement    Past Surgical History:  Procedure Laterality Date   APPENDECTOMY     COLONOSCOPY     FEMUR IM NAIL Left 09/30/2024   Procedure: INSERTION, INTRAMEDULLARY ROD, FEMUR, RETROGRADE;  Surgeon: Onesimo Oneil LABOR, MD;  Location: [facility];  Service: Orthopedics;  Laterality: Left;   INCISION AND DRAINAGE Left 01/21/2022   Procedure: INCISION AND DRAINAGE LEFT INDEX FINGER;  Surgeon: Murrell Drivers, MD;  Location: [facility];  Service: Orthopedics;  Laterality: Left;   OOPHORECTOMY     left   opperectomy     ORIF RADIAL FRACTURE Left 12/27/2022   Procedure: OPEN REDUCTION INTERNAL FIXATION (ORIF) RADIAL and ULNAR FRACTURE;  Surgeon: Margrette Taft BRAVO, MD;  Location: [facility];  Service: Orthopedics;  Laterality: Left;   REVISION TOTAL HIP ARTHROPLASTY Left     Social History   Socioeconomic History   Marital status: Divorced    Spouse name: Not on file   Number of children: 1   Years of education: Not on file   Highest education level: 11th grade  Occupational History   Not on file  Tobacco Use   Smoking status: Never   Smokeless tobacco: Never  Vaping Use   Vaping status: Never Used  Substance and Sexual Activity   Alcohol use: No    Drug use: No   Sexual activity: Yes    Birth control/protection: Post-menopausal  Other Topics Concern   Not on file  Social History Narrative   Not on file   Social Drivers of Health   Tobacco Use: Low Risk (11/25/2024)   Patient History    Smoking Tobacco Use: Never    Smokeless Tobacco Use: Never    Passive Exposure: Not on file  Financial Resource Strain: Not on file  Food Insecurity: No Food Insecurity (11/16/2024)   Epic    Worried About Programme Researcher, Broadcasting/film/video in the Last Year: Never true    Ran Out of Food in the Last Year: Never true  Transportation Needs: No Transportation Needs (11/16/2024)   Epic    Lack of Transportation (Medical): No    Lack of Transportation (Non-Medical): No  Physical Activity: Insufficiently Active (08/23/2024)   Exercise Vital Sign    Days of Exercise per Week: 4 days    Minutes of Exercise per Session: 30 min  Stress: Stress Concern Present (08/23/2024)   [employer] of Occupational Health - Occupational Stress Questionnaire    Feeling of Stress: To some extent  Social Connections: Socially Isolated (11/16/2024)   Social Connection and Isolation Panel    Frequency of Communication with Friends and Family: More than three times  a week    Frequency of Social Gatherings with Friends and Family: Once a week    Attends Religious Services: Never    Database Administrator or Organizations: No    Attends Banker Meetings: Never    Marital Status: Divorced  Catering Manager Violence: Not At Risk (11/16/2024)   Epic    Fear of Current or Ex-Partner: No    Emotionally Abused: No    Physically Abused: No    Sexually Abused: No  Depression (PHQ2-9): Low Risk (08/23/2024)   Depression (PHQ2-9)    PHQ-2 Score: 0  Alcohol Screen: Not on file  Housing: Low Risk (11/16/2024)   Epic    Unable to Pay for Housing in the Last Year: No    Number of Times Moved in the Last Year: 0    Homeless in the Last Year: No  Utilities: Not At Risk  (11/16/2024)   Epic    Threatened with loss of utilities: No  Health Literacy: Not on file   Family History  Problem Relation Age of Onset   Cancer Brother        throat cancer   Cancer Brother        lung cancer   Cancer Brother        prostate cancer      VITAL SIGNS BP 120/78   Pulse 75   Temp 98.6 F (37 C)   Resp 18   Ht 5' 1 (1.549 m)   Wt 129 lb 3.2 oz (58.6 kg)   SpO2 98%   BMI 24.41 kg/m   Outpatient Encounter Medications as of 11/25/2024  Medication Sig   acetaminophen  (TYLENOL ) 325 MG tablet Take 2 tablets (650 mg total) by mouth every 6 (six) hours as needed for mild pain (pain score 1-3) or fever (or Fever >/= 101).   apixaban  (ELIQUIS ) 2.5 MG TABS tablet Take 1 tablet (2.5 mg total) by mouth 2 (two) times daily.   diltiazem  (CARDIZEM  CD) 240 MG 24 hr capsule Take 1 capsule (240 mg total) by mouth daily.   fluticasone  (FLONASE ) 50 MCG/ACT nasal spray Place 2 sprays into both nostrils daily.   folic acid  (FOLVITE ) 1 MG tablet Take 1 tablet (1 mg total) by mouth daily.   ipratropium-albuterol  (DUONEB) 0.5-2.5 (3) MG/3ML SOLN Take 3 mLs by nebulization every 6 (six) hours as needed (wheezing, shortness of breath).   levothyroxine  (SYNTHROID ) 50 MCG tablet Take 1 tablet (50 mcg total) by mouth daily before breakfast.   metoprolol  tartrate (LOPRESSOR ) 25 MG tablet Take 1 tablet (25 mg total) by mouth 2 (two) times daily.   pantoprazole  (PROTONIX ) 40 MG tablet Take 1 tablet (40 mg total) by mouth daily for 7 days.   sennosides-docusate sodium  (SENOKOT-S) 8.6-50 MG tablet Take 1 tablet by mouth daily.   Vitamin D , Ergocalciferol , (DRISDOL ) 1.25 MG (50000 UNIT) CAPS capsule Take 1 capsule (50,000 Units total) by mouth every 30 (thirty) days.   No facility-administered encounter medications on file as of 11/25/2024.     SIGNIFICANT DIAGNOSTIC EXAMS  LABS REVIEWED:   11-15-24: wbc 9,4; hgb 8.7; hct 28.3; mcv 82.3 plt 94; glucose 110; bun 15; creat 0.56; k+ 3.8;  na++ 134; ca 8.0; gfr >60; protein 5.7 albumin 2.8; alk phos 250; vitamin B12: 1456; folic 4.8; iron 46; tibc 181 11-19-24: wbc 15.5; hgb 8.1; hct 25.9; mcv 80.7 plt 95   Review of Systems  Constitutional:  Negative for malaise/fatigue.  Respiratory:  Negative for cough and  shortness of breath.   Cardiovascular:  Positive for leg swelling. Negative for chest pain and palpitations.  Gastrointestinal:  Negative for abdominal pain, constipation and heartburn.  Musculoskeletal:  Negative for back pain, joint pain and myalgias.  Skin: Negative.   Neurological:  Negative for dizziness.  Psychiatric/Behavioral:  The patient is not nervous/anxious.     Physical Exam Constitutional:      General: She is not in acute distress.    Appearance: She is well-developed. She is not diaphoretic.  Neck:     Thyroid : No thyromegaly.  Cardiovascular:     Rate and Rhythm: Normal rate and regular rhythm.     Heart sounds: Normal heart sounds.  Pulmonary:     Effort: Pulmonary effort is normal. No respiratory distress.     Breath sounds: Normal breath sounds.  Abdominal:     General: Bowel sounds are normal. There is no distension.     Palpations: Abdomen is soft.     Tenderness: There is no abdominal tenderness.  Musculoskeletal:        General: Normal range of motion.     Cervical back: Neck supple.     Right lower leg: Edema present.     Left lower leg: Edema present.     Comments: Pitting edema   Lymphadenopathy:     Cervical: No cervical adenopathy.  Skin:    General: Skin is warm and dry.  Neurological:     Mental Status: She is alert. Mental status is at baseline.  Psychiatric:        Mood and Affect: Mood normal.       ASSESSMENT/ PLAN:  TODAY  Bilateral lower extremity edema: will begin lasix  20 mg daily and will check bmp in one week    Barnie Seip NP [facility]   call [phone]      [1]  Allergies Allergen Reactions   Codeine Nausea And Vomiting    Diovan [Valsartan] Itching and Rash   Lamisil [Terbinafine] Swelling   Penicillins Rash and Other (See Comments)    Breaking out in whelps

## 2024-11-25 NOTE — Assessment & Plan Note (Signed)
 S/P full course of parenteral and oral antibiotics and aggressive pulmonary toilet regimen with clinical stabilization and improvement.  She remains afebrile and states that her dyspnea has improved.  She remains on nasal O2.

## 2024-11-29 ENCOUNTER — Other Ambulatory Visit (HOSPITAL_COMMUNITY)
Admission: RE | Admit: 2024-11-29 | Discharge: 2024-11-29 | Disposition: A | Discharge status: Home / Self Care | Source: Skilled Nursing Facility | Attending: Adult Health | Admitting: Adult Health

## 2024-11-29 ENCOUNTER — Encounter: Payer: Self-pay | Admitting: Adult Health

## 2024-11-29 ENCOUNTER — Non-Acute Institutional Stay (SKILLED_NURSING_FACILITY): Payer: Self-pay | Admitting: Adult Health

## 2024-11-29 DIAGNOSIS — D45 Polycythemia vera: Secondary | ICD-10-CM | POA: Insufficient documentation

## 2024-11-29 DIAGNOSIS — R600 Localized edema: Secondary | ICD-10-CM

## 2024-11-29 LAB — CBC
HCT: 23.9 % — ABNORMAL LOW (ref 36.0–46.0)
Hemoglobin: 7.5 g/dL — ABNORMAL LOW (ref 12.0–15.0)
MCH: 24.9 pg — ABNORMAL LOW (ref 26.0–34.0)
MCHC: 31.4 g/dL (ref 30.0–36.0)
MCV: 79.4 fL — ABNORMAL LOW (ref 80.0–100.0)
Platelets: 44 K/uL — ABNORMAL LOW (ref 150–400)
RBC: 3.01 MIL/uL — ABNORMAL LOW (ref 3.87–5.11)
RDW: 20.9 % — ABNORMAL HIGH (ref 11.5–15.5)
WBC: 5.7 K/uL (ref 4.0–10.5)
nRBC: 0.5 % — ABNORMAL HIGH (ref 0.0–0.2)

## 2024-11-29 NOTE — Progress Notes (Unsigned)
 Location:  [facility] Room Number: 130 Place of Service:  SNF (31)   CODE STATUS: full   Allergies[1]  Chief Complaint  Patient presents with   Acute Visit    Worsening edema     HPI:    Past Medical History:  Diagnosis Date   Hypertension    Influenza 11/2012   Osteoporosis    Polycythemia    Polycythemia vera(238.4) 02/25/2013   Diagnosed August 2005 on Hydrea  500 mg 3 times a week with excellent control.    Prolapse, uterus, congenital    ring placement    Past Surgical History:  Procedure Laterality Date   APPENDECTOMY     COLONOSCOPY     FEMUR IM NAIL Left 09/30/2024   Procedure: INSERTION, INTRAMEDULLARY ROD, FEMUR, RETROGRADE;  Surgeon: Onesimo Oneil LABOR, MD;  Location: [facility];  Service: Orthopedics;  Laterality: Left;   INCISION AND DRAINAGE Left 01/21/2022   Procedure: INCISION AND DRAINAGE LEFT INDEX FINGER;  Surgeon: Murrell Drivers, MD;  Location: [facility];  Service: Orthopedics;  Laterality: Left;   OOPHORECTOMY     left   opperectomy     ORIF RADIAL FRACTURE Left 12/27/2022   Procedure: OPEN REDUCTION INTERNAL FIXATION (ORIF) RADIAL and ULNAR FRACTURE;  Surgeon: Margrette Taft BRAVO, MD;  Location: [facility];  Service: Orthopedics;  Laterality: Left;   REVISION TOTAL HIP ARTHROPLASTY Left     Social History   Socioeconomic History   Marital status: Divorced    Spouse name: Not on file   Number of children: 1   Years of education: Not on file   Highest education level: 11th grade  Occupational History   Not on file  Tobacco Use   Smoking status: Never   Smokeless tobacco: Never  Vaping Use   Vaping status: Never Used  Substance and Sexual Activity   Alcohol use: No   Drug use: No   Sexual activity: Yes    Birth control/protection: Post-menopausal  Other Topics Concern   Not on file  Social History Narrative   Not on file   Social Drivers of Health   Tobacco Use: Low Risk (11/29/2024)   Patient History     Smoking Tobacco Use: Never    Smokeless Tobacco Use: Never    Passive Exposure: Not on file  Financial Resource Strain: Not on file  Food Insecurity: No Food Insecurity (11/16/2024)   Epic    Worried About Programme Researcher, Broadcasting/film/video in the Last Year: Never true    Ran Out of Food in the Last Year: Never true  Transportation Needs: No Transportation Needs (11/16/2024)   Epic    Lack of Transportation (Medical): No    Lack of Transportation (Non-Medical): No  Physical Activity: Insufficiently Active (08/23/2024)   Exercise Vital Sign    Days of Exercise per Week: 4 days    Minutes of Exercise per Session: 30 min  Stress: Stress Concern Present (08/23/2024)   [employer] of Occupational Health - Occupational Stress Questionnaire    Feeling of Stress: To some extent  Social Connections: Socially Isolated (11/16/2024)   Social Connection and Isolation Panel    Frequency of Communication with Friends and Family: More than three times a week    Frequency of Social Gatherings with Friends and Family: Once a week    Attends Religious Services: Never    Database Administrator or Organizations: No    Attends Banker Meetings: Never    Marital  Status: Divorced  Intimate Partner Violence: Not At Risk (11/16/2024)   Epic    Fear of Current or Ex-Partner: No    Emotionally Abused: No    Physically Abused: No    Sexually Abused: No  Depression (PHQ2-9): Low Risk (08/23/2024)   Depression (PHQ2-9)    PHQ-2 Score: 0  Alcohol Screen: Not on file  Housing: Low Risk (11/16/2024)   Epic    Unable to Pay for Housing in the Last Year: No    Number of Times Moved in the Last Year: 0    Homeless in the Last Year: No  Utilities: Not At Risk (11/16/2024)   Epic    Threatened with loss of utilities: No  Health Literacy: Not on file   Family History  Problem Relation Age of Onset   Cancer Brother        throat cancer   Cancer Brother        lung cancer   Cancer Brother        prostate  cancer      VITAL SIGNS BP 118/66   Pulse 84   Temp (!) 97.5 F (36.4 C)   Resp 18   Ht 5' 1 (1.549 m)   Wt 120 lb 9.6 oz (54.7 kg)   SpO2 96%   BMI 22.79 kg/m   Outpatient Encounter Medications as of 11/29/2024  Medication Sig   acetaminophen  (TYLENOL ) 325 MG tablet Take 2 tablets (650 mg total) by mouth every 6 (six) hours as needed for mild pain (pain score 1-3) or fever (or Fever >/= 101).   apixaban  (ELIQUIS ) 2.5 MG TABS tablet Take 1 tablet (2.5 mg total) by mouth 2 (two) times daily.   diltiazem  (CARDIZEM  CD) 240 MG 24 hr capsule Take 1 capsule (240 mg total) by mouth daily.   fluticasone  (FLONASE ) 50 MCG/ACT nasal spray Place 2 sprays into both nostrils daily.   folic acid  (FOLVITE ) 1 MG tablet Take 1 tablet (1 mg total) by mouth daily.   furosemide  (LASIX ) 20 MG tablet Take 20 mg by mouth daily.   ipratropium-albuterol  (DUONEB) 0.5-2.5 (3) MG/3ML SOLN Take 3 mLs by nebulization every 6 (six) hours as needed (wheezing, shortness of breath).   levothyroxine  (SYNTHROID ) 50 MCG tablet Take 1 tablet (50 mcg total) by mouth daily before breakfast.   metoprolol  tartrate (LOPRESSOR ) 25 MG tablet Take 1 tablet (25 mg total) by mouth 2 (two) times daily.   pantoprazole  (PROTONIX ) 40 MG tablet Take 1 tablet (40 mg total) by mouth daily for 7 days.   sennosides-docusate sodium  (SENOKOT-S) 8.6-50 MG tablet Take 1 tablet by mouth daily.   Vitamin D , Ergocalciferol , (DRISDOL ) 1.25 MG (50000 UNIT) CAPS capsule Take 1 capsule (50,000 Units total) by mouth every 30 (thirty) days.   No facility-administered encounter medications on file as of 11/29/2024.     SIGNIFICANT DIAGNOSTIC EXAMS       ASSESSMENT/ PLAN:     Barnie Seip NP [facility]   call [phone]     [1]  Allergies Allergen Reactions   Codeine Nausea And Vomiting   Diovan [Valsartan] Itching and Rash   Lamisil [Terbinafine] Swelling   Penicillins Rash and Other (See Comments)    Breaking  out in whelps

## 2024-11-29 NOTE — Telephone Encounter (Signed)
 Debbie please reply directly to the patient

## 2024-12-01 ENCOUNTER — Inpatient Hospital Stay: Admitting: Oncology

## 2024-12-01 DIAGNOSIS — R600 Localized edema: Secondary | ICD-10-CM | POA: Insufficient documentation

## 2024-12-02 ENCOUNTER — Other Ambulatory Visit: Payer: Self-pay

## 2024-12-02 ENCOUNTER — Other Ambulatory Visit (HOSPITAL_COMMUNITY): Admission: RE | Admit: 2024-12-02 | Source: Skilled Nursing Facility

## 2024-12-02 ENCOUNTER — Encounter (HOSPITAL_COMMUNITY): Payer: Self-pay

## 2024-12-02 ENCOUNTER — Inpatient Hospital Stay (HOSPITAL_COMMUNITY)
Admission: EM | Admit: 2024-12-02 | Discharge: 2024-12-16 | DRG: 308 | Disposition: E | Source: Skilled Nursing Facility | Attending: Internal Medicine | Admitting: Internal Medicine

## 2024-12-02 DIAGNOSIS — E860 Dehydration: Secondary | ICD-10-CM | POA: Diagnosis present

## 2024-12-02 DIAGNOSIS — E43 Unspecified severe protein-calorie malnutrition: Secondary | ICD-10-CM | POA: Diagnosis present

## 2024-12-02 DIAGNOSIS — D649 Anemia, unspecified: Secondary | ICD-10-CM | POA: Insufficient documentation

## 2024-12-02 DIAGNOSIS — Z66 Do not resuscitate: Secondary | ICD-10-CM | POA: Diagnosis not present

## 2024-12-02 DIAGNOSIS — D696 Thrombocytopenia, unspecified: Secondary | ICD-10-CM | POA: Diagnosis present

## 2024-12-02 DIAGNOSIS — Z789 Other specified health status: Secondary | ICD-10-CM | POA: Diagnosis not present

## 2024-12-02 DIAGNOSIS — Z9981 Dependence on supplemental oxygen: Secondary | ICD-10-CM | POA: Diagnosis not present

## 2024-12-02 DIAGNOSIS — Z7189 Other specified counseling: Secondary | ICD-10-CM | POA: Diagnosis not present

## 2024-12-02 DIAGNOSIS — K219 Gastro-esophageal reflux disease without esophagitis: Secondary | ICD-10-CM | POA: Diagnosis present

## 2024-12-02 DIAGNOSIS — Z880 Allergy status to penicillin: Secondary | ICD-10-CM

## 2024-12-02 DIAGNOSIS — E039 Hypothyroidism, unspecified: Secondary | ICD-10-CM | POA: Diagnosis present

## 2024-12-02 DIAGNOSIS — I480 Paroxysmal atrial fibrillation: Secondary | ICD-10-CM | POA: Diagnosis present

## 2024-12-02 DIAGNOSIS — D45 Polycythemia vera: Secondary | ICD-10-CM | POA: Diagnosis present

## 2024-12-02 DIAGNOSIS — G9349 Other encephalopathy: Secondary | ICD-10-CM | POA: Diagnosis present

## 2024-12-02 DIAGNOSIS — Z7901 Long term (current) use of anticoagulants: Secondary | ICD-10-CM

## 2024-12-02 DIAGNOSIS — Z79899 Other long term (current) drug therapy: Secondary | ICD-10-CM

## 2024-12-02 DIAGNOSIS — I4891 Unspecified atrial fibrillation: Secondary | ICD-10-CM | POA: Diagnosis present

## 2024-12-02 DIAGNOSIS — Z515 Encounter for palliative care: Secondary | ICD-10-CM | POA: Diagnosis not present

## 2024-12-02 DIAGNOSIS — J811 Chronic pulmonary edema: Secondary | ICD-10-CM | POA: Diagnosis present

## 2024-12-02 DIAGNOSIS — R627 Adult failure to thrive: Secondary | ICD-10-CM | POA: Diagnosis present

## 2024-12-02 DIAGNOSIS — R600 Localized edema: Secondary | ICD-10-CM | POA: Insufficient documentation

## 2024-12-02 DIAGNOSIS — L8990 Pressure ulcer of unspecified site, unspecified stage: Secondary | ICD-10-CM | POA: Diagnosis present

## 2024-12-02 DIAGNOSIS — Z808 Family history of malignant neoplasm of other organs or systems: Secondary | ICD-10-CM

## 2024-12-02 DIAGNOSIS — J90 Pleural effusion, not elsewhere classified: Secondary | ICD-10-CM | POA: Diagnosis present

## 2024-12-02 DIAGNOSIS — R1310 Dysphagia, unspecified: Secondary | ICD-10-CM | POA: Diagnosis present

## 2024-12-02 DIAGNOSIS — J9601 Acute respiratory failure with hypoxia: Secondary | ICD-10-CM | POA: Diagnosis present

## 2024-12-02 DIAGNOSIS — J9621 Acute and chronic respiratory failure with hypoxia: Secondary | ICD-10-CM | POA: Diagnosis present

## 2024-12-02 DIAGNOSIS — I4510 Unspecified right bundle-branch block: Secondary | ICD-10-CM | POA: Diagnosis present

## 2024-12-02 DIAGNOSIS — I4892 Unspecified atrial flutter: Secondary | ICD-10-CM | POA: Diagnosis present

## 2024-12-02 DIAGNOSIS — Z79890 Hormone replacement therapy (postmenopausal): Secondary | ICD-10-CM

## 2024-12-02 DIAGNOSIS — I10 Essential (primary) hypertension: Secondary | ICD-10-CM | POA: Diagnosis present

## 2024-12-02 DIAGNOSIS — R4589 Other symptoms and signs involving emotional state: Secondary | ICD-10-CM | POA: Diagnosis not present

## 2024-12-02 DIAGNOSIS — E8770 Fluid overload, unspecified: Secondary | ICD-10-CM | POA: Diagnosis not present

## 2024-12-02 DIAGNOSIS — Z801 Family history of malignant neoplasm of trachea, bronchus and lung: Secondary | ICD-10-CM

## 2024-12-02 DIAGNOSIS — M810 Age-related osteoporosis without current pathological fracture: Secondary | ICD-10-CM | POA: Diagnosis present

## 2024-12-02 DIAGNOSIS — R54 Age-related physical debility: Secondary | ICD-10-CM | POA: Diagnosis present

## 2024-12-02 DIAGNOSIS — Z8701 Personal history of pneumonia (recurrent): Secondary | ICD-10-CM

## 2024-12-02 DIAGNOSIS — E876 Hypokalemia: Secondary | ICD-10-CM | POA: Diagnosis present

## 2024-12-02 DIAGNOSIS — Z555 Less than a high school diploma: Secondary | ICD-10-CM

## 2024-12-02 DIAGNOSIS — Z96642 Presence of left artificial hip joint: Secondary | ICD-10-CM | POA: Diagnosis present

## 2024-12-02 DIAGNOSIS — Z885 Allergy status to narcotic agent status: Secondary | ICD-10-CM

## 2024-12-02 DIAGNOSIS — Z6821 Body mass index (BMI) 21.0-21.9, adult: Secondary | ICD-10-CM

## 2024-12-02 LAB — COMPREHENSIVE METABOLIC PANEL WITH GFR
ALT: 9 U/L (ref 0–44)
AST: 34 U/L (ref 15–41)
Albumin: 2.4 g/dL — ABNORMAL LOW (ref 3.5–5.0)
Alkaline Phosphatase: 164 U/L — ABNORMAL HIGH (ref 38–126)
Anion gap: 11 (ref 5–15)
BUN: 28 mg/dL — ABNORMAL HIGH (ref 8–23)
CO2: 29 mmol/L (ref 22–32)
Calcium: 11.8 mg/dL — ABNORMAL HIGH (ref 8.9–10.3)
Chloride: 102 mmol/L (ref 98–111)
Creatinine, Ser: 0.88 mg/dL (ref 0.44–1.00)
GFR, Estimated: >60 mL/min (ref >60)
Glucose, Bld: 118 mg/dL — ABNORMAL HIGH (ref 70–99)
Potassium: 2.7 mmol/L — CL (ref 3.5–5.1)
Sodium: 143 mmol/L (ref 135–145)
Total Bilirubin: 0.8 mg/dL (ref 0.0–1.2)
Total Protein: 5.4 g/dL — ABNORMAL LOW (ref 6.5–8.1)

## 2024-12-02 LAB — RENAL FUNCTION PANEL
Albumin: 2.4 g/dL — ABNORMAL LOW (ref 3.5–5.0)
Anion gap: 13 (ref 5–15)
BUN: 26 mg/dL — ABNORMAL HIGH (ref 8–23)
CO2: 23 mmol/L (ref 22–32)
Calcium: 11.4 mg/dL — ABNORMAL HIGH (ref 8.9–10.3)
Chloride: 104 mmol/L (ref 98–111)
Creatinine, Ser: 0.87 mg/dL (ref 0.44–1.00)
GFR, Estimated: >60 mL/min (ref >60)
Glucose, Bld: 135 mg/dL — ABNORMAL HIGH (ref 70–99)
Phosphorus: 4.2 mg/dL (ref 2.5–4.6)
Potassium: 4.1 mmol/L (ref 3.5–5.1)
Sodium: 140 mmol/L (ref 135–145)

## 2024-12-02 LAB — CBC WITH DIFFERENTIAL/PLATELET
Abs Immature Granulocytes: 0.26 K/uL — ABNORMAL HIGH (ref 0.00–0.07)
Basophils Absolute: 0.1 K/uL (ref 0.0–0.1)
Basophils Relative: 2 %
Eosinophils Absolute: 0.1 K/uL (ref 0.0–0.5)
Eosinophils Relative: 2 %
HCT: 23.4 % — ABNORMAL LOW (ref 36.0–46.0)
Hemoglobin: 7.2 g/dL — ABNORMAL LOW (ref 12.0–15.0)
Immature Granulocytes: 6 %
Lymphocytes Relative: 14 %
Lymphs Abs: 0.6 K/uL — ABNORMAL LOW (ref 0.7–4.0)
MCH: 24.7 pg — ABNORMAL LOW (ref 26.0–34.0)
MCHC: 30.8 g/dL (ref 30.0–36.0)
MCV: 80.1 fL (ref 80.0–100.0)
Monocytes Absolute: 0.4 K/uL (ref 0.1–1.0)
Monocytes Relative: 8 %
Neutro Abs: 3.1 K/uL (ref 1.7–7.7)
Neutrophils Relative %: 68 %
Platelets: 33 K/uL — ABNORMAL LOW (ref 150–400)
RBC: 2.92 MIL/uL — ABNORMAL LOW (ref 3.87–5.11)
RDW: 20.8 % — ABNORMAL HIGH (ref 11.5–15.5)
WBC: 4.5 K/uL (ref 4.0–10.5)
nRBC: 0.4 % — ABNORMAL HIGH (ref 0.0–0.2)

## 2024-12-02 LAB — CBC
HCT: 21.7 % — ABNORMAL LOW (ref 36.0–46.0)
Hemoglobin: 6.6 g/dL — CL (ref 12.0–15.0)
MCH: 24.1 pg — ABNORMAL LOW (ref 26.0–34.0)
MCHC: 30.4 g/dL (ref 30.0–36.0)
MCV: 79.2 fL — ABNORMAL LOW (ref 80.0–100.0)
Platelets: 31 K/uL — ABNORMAL LOW (ref 150–400)
RBC: 2.74 MIL/uL — ABNORMAL LOW (ref 3.87–5.11)
RDW: 20.7 % — ABNORMAL HIGH (ref 11.5–15.5)
WBC: 4 K/uL (ref 4.0–10.5)
nRBC: 0.5 % — ABNORMAL HIGH (ref 0.0–0.2)

## 2024-12-02 LAB — BASIC METABOLIC PANEL WITH GFR
Anion gap: 4 — ABNORMAL LOW (ref 5–15)
BUN: 27 mg/dL — ABNORMAL HIGH (ref 8–23)
CO2: 36 mmol/L — ABNORMAL HIGH (ref 22–32)
Calcium: 11.3 mg/dL — ABNORMAL HIGH (ref 8.9–10.3)
Chloride: 102 mmol/L (ref 98–111)
Creatinine, Ser: 0.86 mg/dL (ref 0.44–1.00)
GFR, Estimated: >60 mL/min (ref >60)
Glucose, Bld: 110 mg/dL — ABNORMAL HIGH (ref 70–99)
Potassium: 2.6 mmol/L — CL (ref 3.5–5.1)
Sodium: 142 mmol/L (ref 135–145)

## 2024-12-02 LAB — PREPARE RBC (CROSSMATCH)

## 2024-12-02 LAB — ABO/RH: ABO/RH(D): O POS

## 2024-12-02 LAB — MAGNESIUM: Magnesium: 1.9 mg/dL (ref 1.7–2.4)

## 2024-12-02 LAB — TSH: TSH: 4.17 u[IU]/mL (ref 0.350–4.500)

## 2024-12-02 LAB — MRSA NEXT GEN BY PCR, NASAL: MRSA by PCR Next Gen: NOT DETECTED

## 2024-12-02 LAB — HEMOGLOBIN AND HEMATOCRIT, BLOOD
HCT: 27.4 % — ABNORMAL LOW (ref 36.0–46.0)
Hemoglobin: 8.7 g/dL — ABNORMAL LOW (ref 12.0–15.0)

## 2024-12-02 LAB — LIPASE, BLOOD: Lipase: 29 U/L (ref 11–51)

## 2024-12-02 MED ORDER — METOPROLOL TARTRATE 25 MG PO TABS
25.0000 mg | ORAL_TABLET | Freq: Two times a day (BID) | ORAL | Status: DC
  Administered 2024-12-02 – 2024-12-05 (×6): 25 mg via ORAL
  Filled 2024-12-02 (×6): qty 1

## 2024-12-02 MED ORDER — TRAZODONE HCL 50 MG PO TABS: 50.0000 mg | ORAL_TABLET | Freq: Every evening | ORAL | Status: DC | PRN

## 2024-12-02 MED ORDER — DILTIAZEM HCL-DEXTROSE 125-5 MG/125ML-% IV SOLN (PREMIX)
5.0000 mg/h | INTRAVENOUS | Status: DC
  Administered 2024-12-02: 14:00:00 5 mg/h via INTRAVENOUS
  Filled 2024-12-02: qty 125

## 2024-12-02 MED ORDER — SODIUM CHLORIDE 0.9% IV SOLUTION: Freq: Once | INTRAVENOUS | Status: AC

## 2024-12-02 MED ORDER — POLYETHYLENE GLYCOL 3350 17 G PO PACK
17.0000 g | PACK | Freq: Every day | ORAL | Status: DC | PRN
  Administered 2024-12-03: 17 g via ORAL

## 2024-12-02 MED ORDER — SODIUM CHLORIDE 0.9 % IV BOLUS
500.0000 mL | Freq: Once | INTRAVENOUS | Status: AC
  Administered 2024-12-02: 08:00:00 500 mL via INTRAVENOUS

## 2024-12-02 MED ORDER — AMIODARONE LOAD VIA INFUSION
150.0000 mg | Freq: Once | INTRAVENOUS | Status: AC
  Administered 2024-12-02: 16:00:00 150 mg via INTRAVENOUS
  Filled 2024-12-02: qty 83.34

## 2024-12-02 MED ORDER — POTASSIUM CHLORIDE 10 MEQ/100ML IV SOLN
10.0000 meq | Freq: Once | INTRAVENOUS | Status: AC
  Administered 2024-12-02: 09:00:00 10 meq via INTRAVENOUS
  Filled 2024-12-02: qty 100

## 2024-12-02 MED ORDER — LEVOTHYROXINE SODIUM 25 MCG PO TABS
50.0000 ug | ORAL_TABLET | Freq: Every day | ORAL | Status: DC
  Administered 2024-12-03 – 2024-12-04 (×2): 50 ug via ORAL
  Filled 2024-12-02 (×2): qty 2

## 2024-12-02 MED ORDER — BISACODYL 10 MG RE SUPP: 10.0000 mg | Freq: Every day | RECTAL | Status: DC | PRN

## 2024-12-02 MED ORDER — CHLORHEXIDINE GLUCONATE CLOTH 2 % EX PADS
6.0000 | MEDICATED_PAD | Freq: Every day | CUTANEOUS | Status: DC
  Administered 2024-12-03 – 2024-12-06 (×4): 6 via TOPICAL

## 2024-12-02 MED ORDER — SODIUM CHLORIDE 0.9% FLUSH
3.0000 mL | Freq: Two times a day (BID) | INTRAVENOUS | Status: DC
  Administered 2024-12-02 – 2024-12-07 (×8): 3 mL via INTRAVENOUS

## 2024-12-02 MED ORDER — PANTOPRAZOLE SODIUM 40 MG PO TBEC
40.0000 mg | DELAYED_RELEASE_TABLET | Freq: Every day | ORAL | Status: DC
  Administered 2024-12-02 – 2024-12-03 (×2): 40 mg via ORAL
  Filled 2024-12-02 (×2): qty 1

## 2024-12-02 MED ORDER — AMIODARONE HCL IN DEXTROSE 360-4.14 MG/200ML-% IV SOLN
60.0000 mg/h | INTRAVENOUS | Status: AC
  Administered 2024-12-02 (×2): 60 mg/h via INTRAVENOUS
  Filled 2024-12-02 (×2): qty 200

## 2024-12-02 MED ORDER — POTASSIUM CHLORIDE CRYS ER 20 MEQ PO TBCR
40.0000 meq | EXTENDED_RELEASE_TABLET | Freq: Once | ORAL | Status: AC
  Administered 2024-12-02: 09:00:00 40 meq via ORAL
  Filled 2024-12-02: qty 2

## 2024-12-02 MED ORDER — POTASSIUM CHLORIDE 10 MEQ/100ML IV SOLN
10.0000 meq | Freq: Once | INTRAVENOUS | Status: AC
  Administered 2024-12-02: 08:00:00 10 meq via INTRAVENOUS
  Filled 2024-12-02: qty 100

## 2024-12-02 MED ORDER — AMIODARONE HCL IN DEXTROSE 360-4.14 MG/200ML-% IV SOLN
30.0000 mg/h | INTRAVENOUS | Status: DC
  Administered 2024-12-02 – 2024-12-06 (×7): 30 mg/h via INTRAVENOUS
  Filled 2024-12-02 (×8): qty 200

## 2024-12-02 MED ORDER — SENNOSIDES-DOCUSATE SODIUM 8.6-50 MG PO TABS
1.0000 | ORAL_TABLET | Freq: Every day | ORAL | Status: DC
  Administered 2024-12-03 – 2024-12-05 (×3): 1 via ORAL
  Filled 2024-12-02 (×4): qty 1

## 2024-12-02 MED ORDER — ACETAMINOPHEN 325 MG PO TABS: 650.0000 mg | ORAL_TABLET | Freq: Four times a day (QID) | ORAL | Status: DC | PRN

## 2024-12-02 MED ORDER — FOLIC ACID 1 MG PO TABS
1.0000 mg | ORAL_TABLET | Freq: Every day | ORAL | Status: DC
  Administered 2024-12-02 – 2024-12-05 (×4): 1 mg via ORAL
  Filled 2024-12-02 (×4): qty 1

## 2024-12-02 MED ORDER — FUROSEMIDE 20 MG PO TABS
20.0000 mg | ORAL_TABLET | Freq: Every day | ORAL | Status: DC
  Administered 2024-12-02: 18:00:00 20 mg via ORAL
  Filled 2024-12-02: qty 1

## 2024-12-02 MED ORDER — ONDANSETRON HCL 4 MG PO TABS: 4.0000 mg | ORAL_TABLET | Freq: Four times a day (QID) | ORAL | Status: DC | PRN

## 2024-12-02 MED ORDER — SODIUM CHLORIDE 0.9% FLUSH
3.0000 mL | Freq: Two times a day (BID) | INTRAVENOUS | Status: DC
  Administered 2024-12-02 – 2024-12-07 (×9): 3 mL via INTRAVENOUS

## 2024-12-02 MED ORDER — ACETAMINOPHEN 650 MG RE SUPP: 650.0000 mg | Freq: Four times a day (QID) | RECTAL | Status: DC | PRN

## 2024-12-02 MED ORDER — POTASSIUM CHLORIDE 10 MEQ/100ML IV SOLN
10.0000 meq | INTRAVENOUS | Status: AC
  Administered 2024-12-02 (×4): 10 meq via INTRAVENOUS
  Filled 2024-12-02 (×4): qty 100

## 2024-12-02 MED ORDER — ONDANSETRON HCL 4 MG/2ML IJ SOLN: 4.0000 mg | Freq: Four times a day (QID) | INTRAMUSCULAR | Status: DC | PRN

## 2024-12-02 MED ORDER — SODIUM CHLORIDE 0.9% FLUSH: 3.0000 mL | INTRAVENOUS | Status: DC | PRN

## 2024-12-02 MED ORDER — SODIUM CHLORIDE 0.9 % IV SOLN: INTRAVENOUS | Status: AC | PRN

## 2024-12-02 MED ORDER — ORAL CARE MOUTH RINSE: 15.0000 mL | OROMUCOSAL | Status: DC | PRN

## 2024-12-02 MED ORDER — FERROUS SULFATE 325 (65 FE) MG PO TABS
325.0000 mg | ORAL_TABLET | Freq: Every day | ORAL | Status: DC
  Administered 2024-12-03 – 2024-12-05 (×3): 325 mg via ORAL
  Filled 2024-12-02 (×3): qty 1

## 2024-12-02 NOTE — ED Notes (Signed)
 Provider notified pt in A-fib RVR

## 2024-12-02 NOTE — H&P (Signed)
 History and Physical    Patient: [name] FMW:[id number] DOB: September 24, 1936 DOA: 12/02/2024 DOS: the patient was seen and examined on 12/02/2024 PCP: Grooms, [place], PA-C  Patient coming from: SNF  Chief Complaint:  Chief Complaint  Patient presents with   abnormal labs   HPI: [name] is a 88 y.o. female with medical history significant for h/o polycythemia vera (previously on Hydrea ), paroxysmal atrial fibrillation, hypertension, hypothyroidism, chronic anemia and thrombocytopenia as well as chronic hypoxic respiratory failure after recent hospitalization for pneumonia presents to the ED from [facility] rehab in [place] with RVR in the setting of low hemoglobin and low potassium -- No Nausea, Vomiting or Diarrhea - Additional history obtained from patient's daughter Ms. [name] No chest pains  In ED tachycardia persisted despite IV Cardizem , Dr. Debera recommended IV amiodarone  instead - In the ED potassium is 2.7, magnesium is 1.9, creatinine 0.88, bicarb 29,  BUN 28 alk phos 164 otherwise LFTs are not elevated --Hemoglobin 7.2 up from 6.6, WBC 4.5 platelets 33 up from 31 -EKG is A-fib with RVR with incomplete right bundle branch block   Review of Systems: As mentioned in the history of present illness. All other systems reviewed and are negative. Past Medical History:  Diagnosis Date   Hypertension    Influenza 11/2012   Osteoporosis    Polycythemia    Polycythemia vera(238.4) 02/25/2013   Diagnosed August 2005 on Hydrea  500 mg 3 times a week with excellent control.    Prolapse, uterus, congenital    ring placement   Past Surgical History:  Procedure Laterality Date   APPENDECTOMY     COLONOSCOPY     FEMUR IM NAIL Left 09/30/2024   Procedure: INSERTION, INTRAMEDULLARY ROD, FEMUR, RETROGRADE;  Surgeon: Onesimo Oneil LABOR, MD;  Location: [facility];  Service: Orthopedics;  Laterality: Left;   INCISION AND DRAINAGE Left 01/21/2022   Procedure: INCISION AND DRAINAGE  LEFT INDEX FINGER;  Surgeon: Murrell Drivers, MD;  Location: [facility];  Service: Orthopedics;  Laterality: Left;   OOPHORECTOMY     left   opperectomy     ORIF RADIAL FRACTURE Left 12/27/2022   Procedure: OPEN REDUCTION INTERNAL FIXATION (ORIF) RADIAL and ULNAR FRACTURE;  Surgeon: Margrette Taft BRAVO, MD;  Location: [facility];  Service: Orthopedics;  Laterality: Left;   REVISION TOTAL HIP ARTHROPLASTY Left    Social History:  reports that she has never smoked. She has never used smokeless tobacco. She reports that she does not drink alcohol and does not use drugs.  Allergies[1]  Family History  Problem Relation Age of Onset   Cancer Brother        throat cancer   Cancer Brother        lung cancer   Cancer Brother        prostate cancer    Prior to Admission medications  Medication Sig Start Date End Date Taking? Authorizing Provider  acetaminophen  (TYLENOL ) 325 MG tablet Take 2 tablets (650 mg total) by mouth every 6 (six) hours as needed for mild pain (pain score 1-3) or fever (or Fever >/= 101). 10/05/24  Yes Shahmehdi, Adriana LABOR, MD  apixaban  (ELIQUIS ) 2.5 MG TABS tablet Take 1 tablet (2.5 mg total) by mouth 2 (two) times daily. 10/20/24  Yes Landy Barnie GORMAN, NP  Balsam [place]-Castor Oil OINT Apply 1 application  topically 3 (three) times daily.   Yes [provider]  diltiazem  (CARDIZEM  CD) 240 MG 24 hr capsule Take 1 capsule (  240 mg total) by mouth daily. 10/20/24 12/02/24 Yes Landy Barnie RAMAN, NP  fluticasone  (FLONASE ) 50 MCG/ACT nasal spray Place 2 sprays into both nostrils daily. 10/20/24  Yes Landy Barnie RAMAN, NP  folic acid  (FOLVITE ) 1 MG tablet Take 1 tablet (1 mg total) by mouth daily. 11/20/24  Yes Johnson, Clanford L, MD  furosemide  (LASIX ) 20 MG tablet Take 20 mg by mouth daily.   Yes [provider]  ipratropium-albuterol  (DUONEB) 0.5-2.5 (3) MG/3ML SOLN Take 3 mLs by nebulization every 6 (six) hours as needed (wheezing, shortness of breath).  11/19/24  Yes Johnson, Clanford L, MD  levothyroxine  (SYNTHROID ) 50 MCG tablet Take 1 tablet (50 mcg total) by mouth daily before breakfast. 10/20/24  Yes Landy Barnie RAMAN, NP  metoprolol  tartrate (LOPRESSOR ) 25 MG tablet Take 1 tablet (25 mg total) by mouth 2 (two) times daily. 10/20/24 01/18/25 Yes Landy Barnie RAMAN, NP  potassium chloride  (KLOR-CON ) 20 MEQ packet Take 20 mEq by mouth once.   Yes [provider]  Protein (PROSOURCE PO) Take by mouth 2 (two) times daily.   Yes [provider]  sennosides-docusate sodium  (SENOKOT-S) 8.6-50 MG tablet Take 1 tablet by mouth daily.   Yes [provider]  Vitamin D , Ergocalciferol , (DRISDOL ) 1.25 MG (50000 UNIT) CAPS capsule Take 1 capsule (50,000 Units total) by mouth every 30 (thirty) days. 11/19/24  Yes Johnson, Clanford L, MD  ferrous sulfate 325 (65 FE) MG EC tablet Take 325 mg by mouth 3 (three) times a week. Monday,Wednesday, and Friday    [provider]   Physical Exam: Vitals:   12/02/24 1545 12/02/24 1559 12/02/24 1619 12/02/24 1645  BP:   (!) 128/55 (!) 141/71  Pulse: (!) 118  (!) 105 97  Resp: (!) 25  (!) 26 (!) 23  Temp:   98.5 F (36.9 C) 98 F (36.7 C)  TempSrc:   Oral Oral  SpO2: 96%  98% 98%  Weight:  52.5 kg    Height:  5' 1 (1.549 m)      Physical Exam  Gen:- Awake Alert, chronically ill-appearing, in no acute distress HEENT:- [place].AT, No sclera icterus Nose- [place] 2L/min Neck-Supple Neck,No JVD,.  Lungs-  No wheezing, fair air movement bilaterally  CV- S1, S2 normal, irregularly irregular and tachycardic Abd-  +ve B.Sounds, Abd Soft, No tenderness,    Extremity/Skin:-+ve  edema,   good pedal pulses  Psych-affect is appropriate, oriented x3 Neuro-generalized weakness, no new focal deficits, no tremors  Data Reviewed:  In the ED potassium is 2.7, magnesium is 1.9, creatinine 0.88, bicarb 29,  BUN 28 alk phos 164 otherwise LFTs are not elevated --Hemoglobin 7.2 up from 6.6, WBC 4.5  platelets 33 up from 31 -EKG is A-fib with RVR with incomplete right bundle branch block  Assessment and Plan: 1) A-fib with RVR---EKG is A-fib with RVR with incomplete right bundle branch block In ED tachycardia persisted despite IV Cardizem , Dr. Debera recommended IV amiodarone  instead -Continue metoprolol  -Check TSH ---Hold  Eliquis  due to anemia and thrombocytopenia  2)Acute on chronic symptomatic anemia--patient has history of polycythemia vera she used to be on Hydrea , has been anemic for a while now so she taken off Hydrea  -Hemoglobin 7.2 up from 6.6,  - No obvious bleeding concerns - Hold Eliquis  as above - Check stool occult blood -Give iron and folic acid  supplementation - Transfuse 1 unit of PRBC  3)Hypokalemia---potassium is 2.7, magnesium is 1.9,  no vomiting or diarrhea Patient is on Lasix  -Replace potassium -  Keep potassium close to 4 and magnesium close to 2  4)Chronic Thrombocytopenia-- platelets 33 up from 31 -- Lately platelets have been trending down overall - Eliquis  discontinued as above #1 - Consider hematology consult if platelets continue to drift down  5) chronic hypoxic respiratory failure--- oxygen requirement same at 2 L via nasal cannula - Patient has been on continuous O2 for the last couple of weeks after treatment for pneumonia  6) hypothyroidism--check TSH due to #1 above - Continue levothyroxine   7)GERD--continue Protonix    Advance Care Planning:   Code Status: Full Code   Consults: Cardiology  Family Communication: Discussed with patient's daughter Ms. [name]  Severity of Illness: The appropriate patient status for this patient is INPATIENT. Inpatient status is judged to be reasonable and necessary in order to provide the required intensity of service to ensure the patient's safety. The patient's presenting symptoms, physical exam findings, and initial radiographic and laboratory data in the context of their chronic  comorbidities is felt to place them at high risk for further clinical deterioration. Furthermore, it is not anticipated that the patient will be medically stable for discharge from the hospital within 2 midnights of admission.   * I certify that at the point of admission it is my clinical judgment that the patient will require inpatient hospital care spanning beyond 2 midnights from the point of admission due to high intensity of service, high risk for further deterioration and high frequency of surveillance required.*  Author: Rendall Carwin, MD 12/02/2024 6:05 PM  For on call review [email or web address].     [1]  Allergies Allergen Reactions   Codeine Nausea And Vomiting   Diovan [Valsartan] Itching and Rash   Lamisil [Terbinafine] Swelling   Penicillins Rash and Other (See Comments)    Breaking out in whelps

## 2024-12-02 NOTE — ED Provider Notes (Signed)
 [place] EMERGENCY DEPARTMENT AT [facility] Provider Note   CSN: [id number] Arrival date & time: 12/02/24  9276     Patient presents with: abnormal labs   [name] is a 88 y.o. female.  {Add pertinent medical, surgical, social history, OB history to YEP:67052} Patient has history of polycythemia vera, atrial fibs, recent UTI and pneumonia.  She had labs checked at the nursing home and her hemoglobin was less than 7 and her potassium was 2.6.  She was sent from the nursing home for evaluation   Weakness      Prior to Admission medications  Medication Sig Start Date End Date Taking? Authorizing Provider  acetaminophen  (TYLENOL ) 325 MG tablet Take 2 tablets (650 mg total) by mouth every 6 (six) hours as needed for mild pain (pain score 1-3) or fever (or Fever >/= 101). 10/05/24   Shahmehdi, Adriana LABOR, MD  apixaban  (ELIQUIS ) 2.5 MG TABS tablet Take 1 tablet (2.5 mg total) by mouth 2 (two) times daily. 10/20/24   Landy Barnie GORMAN, NP  diltiazem  (CARDIZEM  CD) 240 MG 24 hr capsule Take 1 capsule (240 mg total) by mouth daily. 10/20/24 11/19/24  Landy Barnie GORMAN, NP  fluticasone  (FLONASE ) 50 MCG/ACT nasal spray Place 2 sprays into both nostrils daily. 10/20/24   Landy Barnie GORMAN, NP  folic acid  (FOLVITE ) 1 MG tablet Take 1 tablet (1 mg total) by mouth daily. 11/20/24   Johnson, Clanford L, MD  furosemide  (LASIX ) 20 MG tablet Take 20 mg by mouth daily.    [provider]  ipratropium-albuterol  (DUONEB) 0.5-2.5 (3) MG/3ML SOLN Take 3 mLs by nebulization every 6 (six) hours as needed (wheezing, shortness of breath). 11/19/24   Vicci Afton CROME, MD  levothyroxine  (SYNTHROID ) 50 MCG tablet Take 1 tablet (50 mcg total) by mouth daily before breakfast. 10/20/24   Landy Barnie GORMAN, NP  metoprolol  tartrate (LOPRESSOR ) 25 MG tablet Take 1 tablet (25 mg total) by mouth 2 (two) times daily. 10/20/24 01/18/25  Landy Barnie GORMAN, NP  pantoprazole  (PROTONIX ) 40 MG tablet Take 1 tablet  (40 mg total) by mouth daily for 7 days. 11/20/24 11/27/24  Johnson, Clanford L, MD  sennosides-docusate sodium  (SENOKOT-S) 8.6-50 MG tablet Take 1 tablet by mouth daily.    [provider]  Vitamin D , Ergocalciferol , (DRISDOL ) 1.25 MG (50000 UNIT) CAPS capsule Take 1 capsule (50,000 Units total) by mouth every 30 (thirty) days. 11/19/24   Vicci Afton CROME, MD    Allergies: Codeine, Diovan [valsartan], Lamisil [terbinafine], and Penicillins    Review of Systems  Neurological:  Positive for weakness.    Updated Vital Signs BP 109/61   Pulse 91   Temp 98.1 F (36.7 C) (Oral)   Resp (!) 21   Ht 5' 1 (1.549 m)   Wt 54.7 kg   SpO2 95%   BMI 22.79 kg/m   Physical Exam  (all labs ordered are listed, but only abnormal results are displayed) Labs Reviewed  CBC WITH DIFFERENTIAL/PLATELET - Abnormal; Notable for the following components:      Result Value   RBC 2.92 (*)    Hemoglobin 7.2 (*)    HCT 23.4 (*)    MCH 24.7 (*)    RDW 20.8 (*)    Platelets 33 (*)    nRBC 0.4 (*)    Lymphs Abs 0.6 (*)    Abs Immature Granulocytes 0.26 (*)    All other components within normal limits  COMPREHENSIVE METABOLIC PANEL WITH GFR - Abnormal;  Notable for the following components:   Potassium 2.7 (*)    Glucose, Bld 118 (*)    BUN 28 (*)    Calcium 11.8 (*)    Total Protein 5.4 (*)    Albumin 2.4 (*)    Alkaline Phosphatase 164 (*)    All other components within normal limits  LIPASE, BLOOD  CBC  TYPE AND SCREEN  ABO/RH    EKG: None  Radiology: No results found.  {Document cardiac monitor, telemetry assessment procedure when appropriate:32947} Procedures   Medications Ordered in the ED  potassium chloride  10 mEq in 100 mL IVPB (0 mEq Intravenous Stopped 12/02/24 0858)  sodium chloride  0.9 % bolus 500 mL (0 mLs Intravenous Stopped 12/02/24 1059)  potassium chloride  10 mEq in 100 mL IVPB (0 mEq Intravenous Stopped 12/02/24 1022)  potassium chloride  SA (KLOR-CON  M) CR  tablet 40 mEq (40 mEq Oral Given 12/02/24 0917)   CRITICAL CARE Performed by: Fairy Sermon Total critical care time: 45 minutes Critical care time was exclusive of separately billable procedures and treating other patients. Critical care was necessary to treat or prevent imminent or life-threatening deterioration. Critical care was time spent personally by me on the following activities: development of treatment plan with patient and/or surrogate as well as nursing, discussions with consultants, evaluation of patient's response to treatment, examination of patient, obtaining history from patient or surrogate, ordering and performing treatments and interventions, ordering and review of laboratory studies, ordering and review of radiographic studies, pulse oximetry and re-evaluation of patient's condition.   Patient with hypokalemia.  She will be admitted for replenishing potassium.  She also has anemia but her hemoglobin here is 7.2 which is slightly lower than her normal.  And she has hypercalcemia.   {Click here for ABCD2, HEART and other calculators REFRESH Note before signing:1}                              Medical Decision Making Amount and/or Complexity of Data Reviewed Labs: ordered. ECG/medicine tests: ordered.  Risk Prescription drug management. Decision regarding hospitalization.   Hypokalemia, hypercalcemia, anemia, patient will be admitted to medicine to treat these abnormalities  {Document critical care time when appropriate  Document review of labs and clinical decision tools ie CHADS2VASC2, etc  Document your independent review of radiology images and any outside records  Document your discussion with family members, caretakers and with consultants  Document social determinants of health affecting pt's care  Document your decision making why or why not admission, treatments were needed:32947:::1}   Final diagnoses:  Hypokalemia    ED Discharge Orders     None

## 2024-12-02 NOTE — Plan of Care (Signed)
 Vitals remain stable coming to floor.  Blood started anti antiarrythmic running.

## 2024-12-02 NOTE — ED Notes (Signed)
 Provider notified pt on max dose of cardizem .

## 2024-12-02 NOTE — ED Triage Notes (Signed)
 Patietn BIB [facility] from [facility], labs taken this am abnormal results, low potassium and low hemoglobin. Hx of afb and is on 2L oxygen chronically,

## 2024-12-02 NOTE — ED Notes (Signed)
Pt's family is at bedside at this time

## 2024-12-03 ENCOUNTER — Inpatient Hospital Stay (HOSPITAL_COMMUNITY)

## 2024-12-03 DIAGNOSIS — D649 Anemia, unspecified: Secondary | ICD-10-CM | POA: Diagnosis not present

## 2024-12-03 DIAGNOSIS — I480 Paroxysmal atrial fibrillation: Secondary | ICD-10-CM

## 2024-12-03 DIAGNOSIS — E039 Hypothyroidism, unspecified: Secondary | ICD-10-CM | POA: Diagnosis not present

## 2024-12-03 DIAGNOSIS — E876 Hypokalemia: Secondary | ICD-10-CM | POA: Diagnosis not present

## 2024-12-03 LAB — TYPE AND SCREEN
ABO/RH(D): O POS
Antibody Screen: NEGATIVE
Unit division: 0

## 2024-12-03 LAB — BPAM RBC
Blood Product Expiration Date: 202601232359
ISSUE DATE / TIME: 202512181625
Unit Type and Rh: 5100

## 2024-12-03 LAB — CBC
HCT: 24.6 % — ABNORMAL LOW (ref 36.0–46.0)
Hemoglobin: 7.9 g/dL — ABNORMAL LOW (ref 12.0–15.0)
MCH: 25.7 pg — ABNORMAL LOW (ref 26.0–34.0)
MCHC: 32.1 g/dL (ref 30.0–36.0)
MCV: 80.1 fL (ref 80.0–100.0)
Platelets: 28 K/uL — CL (ref 150–400)
RBC: 3.07 MIL/uL — ABNORMAL LOW (ref 3.87–5.11)
RDW: 19.4 % — ABNORMAL HIGH (ref 11.5–15.5)
WBC: 4.1 K/uL (ref 4.0–10.5)
nRBC: 0.5 % — ABNORMAL HIGH (ref 0.0–0.2)

## 2024-12-03 LAB — BASIC METABOLIC PANEL WITH GFR
Anion gap: 6 (ref 5–15)
BUN: 29 mg/dL — ABNORMAL HIGH (ref 8–23)
CO2: 32 mmol/L (ref 22–32)
Calcium: 11.7 mg/dL — ABNORMAL HIGH (ref 8.9–10.3)
Chloride: 102 mmol/L (ref 98–111)
Creatinine, Ser: 0.97 mg/dL (ref 0.44–1.00)
GFR, Estimated: 57 mL/min — ABNORMAL LOW
Glucose, Bld: 131 mg/dL — ABNORMAL HIGH (ref 70–99)
Potassium: 3.4 mmol/L — ABNORMAL LOW (ref 3.5–5.1)
Sodium: 141 mmol/L (ref 135–145)

## 2024-12-03 MED ORDER — POTASSIUM CHLORIDE CRYS ER 20 MEQ PO TBCR
40.0000 meq | EXTENDED_RELEASE_TABLET | Freq: Once | ORAL | Status: AC
  Administered 2024-12-03: 40 meq via ORAL
  Filled 2024-12-03: qty 2

## 2024-12-03 MED ORDER — POLYETHYLENE GLYCOL 3350 17 G PO PACK
17.0000 g | PACK | Freq: Every day | ORAL | Status: DC
  Filled 2024-12-03: qty 1

## 2024-12-03 MED ORDER — FUROSEMIDE 10 MG/ML IJ SOLN
40.0000 mg | Freq: Every day | INTRAMUSCULAR | Status: DC
  Administered 2024-12-03 – 2024-12-04 (×2): 40 mg via INTRAVENOUS
  Filled 2024-12-03 (×2): qty 4

## 2024-12-03 NOTE — Consult Note (Addendum)
 [facility] Consultation Hematology/Oncology  CONSULTING PHYSICIAN: Dr. Adron Fairly, DO  REASON FOR CONSULT: Thrombocytopenia and anemia    HISTORY OF PRESENT ILLNESS:   [name] is a 88 y.o. female with past medical history of polycythemia vera previously on Hydrea , A-fib, chronic anemia and thrombocytopenia admitted for pneumonia from [facility].  Hematology was consulted for worsening anemia and thrombocytopenia.  Patient was recently admitted on 11/15/2024 for UTI and pneumonia at that time also had worsening anemia and thrombocytopenia.  Hydroxyurea  was discontinued at that time.  Patient at that time received IV antibiotics and was discharged home with 3 days of doxycycline .  He was readmitted yesterday for pneumonia.  She also was found to be in A-fib and was started on amiodarone .  At the time of exam, patient was resting in her bed and did not report any complaints.  She denies bleeding, melena, easy bruising.  She has no other complaints except for tiredness.     MEDICATIONS: I have reviewed the patient's current medications.       PERFORMANCE STATUS: The patient's performance status is 3 - Symptomatic, >50% confined to bed  PHYSICAL EXAM: Most Recent Vital Signs: Blood pressure (!) 134/103, pulse 92, temperature 97.8 F (36.6 C), temperature source Oral, resp. rate 12, height 6' (1.829 m), weight 270 lb (122.5 kg), SpO2 91%.  GENERAL:alert, no distress and comfortable SKIN: skin color, texture, turgor are normal, no rashes or significant lesions LYMPH:  no palpable lymphadenopathy in the cervical, axillary or inguinal LUNGS: clear to auscultation and percussion with normal breathing effort HEART: regular rate & rhythm and no murmurs and no lower extremity edema ABDOMEN:abdomen soft, non-tender and normal bowel sounds Musculoskeletal:no cyanosis of digits and no clubbing  PSYCH: alert & oriented x 3 with fluent speech   LABORATORY DATA:   Last CBC Lab  Results  Component Value Date   WBC 4.1 12/03/2024   HGB 7.9 (L) 12/03/2024   HCT 24.6 (L) 12/03/2024   MCV 80.1 12/03/2024   MCH 25.7 (L) 12/03/2024   RDW 19.4 (H) 12/03/2024   PLT 28 (LL) 12/03/2024     Last metabolic panel Lab Results  Component Value Date   GLUCOSE 131 (H) 12/03/2024   NA 141 12/03/2024   K 3.4 (L) 12/03/2024   CL 102 12/03/2024   CO2 32 12/03/2024   BUN 29 (H) 12/03/2024   CREATININE 0.97 12/03/2024   GFRNONAA 57 (L) 12/03/2024   CALCIUM 11.7 (H) 12/03/2024   PHOS 4.2 12/02/2024   PROT 5.4 (L) 12/02/2024   ALBUMIN 2.4 (L) 12/02/2024   BILITOT 0.8 12/02/2024   ALKPHOS 164 (H) 12/02/2024   AST 34 12/02/2024   ALT 9 12/02/2024   ANIONGAP 6 12/03/2024     Latest Reference Range & Units 11/15/24 19:00  Iron 28 - 170 ug/dL 46  UIBC ug/dL 865  TIBC 749 - 549 ug/dL 818 (L)  Saturation Ratios 10.4 - 31.8 % 26  Ferritin 11 - 307 ng/mL 342 (H)  Folate >5.9 ng/mL 4.8 (L)  (L): Data is abnormally low (H): Data is abnormally high   Latest Reference Range & Units 11/15/24 19:00  Vitamin B12 180 - 914 pg/mL 1,456 (H)  (H): Data is abnormally high   Latest Reference Range & Units 12/02/24 07:48  Total Bilirubin 0.0 - 1.2 mg/dL 0.8    RADIOGRAPHY:  DG CHEST PORT 1 VIEW EXAM: 1 VIEW(S) XRAY OF THE CHEST 12/03/2024 04:00:00 AM  COMPARISON: 11/12/2024  CLINICAL HISTORY: Dyspnea  and respiratory abnormalities  FINDINGS:  LUNGS AND PLEURA: Mild pulmonary edema. Small bilateral pleural effusions. Increased bibasilar airspace opacities. Chronic coarsened interstitial markings. No pneumothorax.  HEART AND MEDIASTINUM: Cardiomegaly. Atherosclerotic calcifications.  BONES AND SOFT TISSUES: No acute osseous abnormality.  IMPRESSION: 1. Mild pulmonary edema and small bilateral pleural effusions. 2. Increased bibasilar airspace opacities and chronic coarsened interstitial markings. 3. Cardiomegaly.  Electronically signed by: Waddell Calk MD  12/03/2024 07:49 AM EST RP Workstation: [id number]        ASSESSMENT:  Patient is an 88 year old female with multiple comorbidities and previous history of polycythemia vera on hydroxyurea  (discontinued at the beginning of this month) admitted for pneumonia but has worsening anemia and thrombocytopenia.  PLAN:   Anemia and thrombocytopenia Differential to include acute illness, nutritional deficiency, medication induced, and progression to acute leukemia No abnormal cells on peripheral smear including blasts seen Labs reviewed: Normal bilirubin-low suspicion for hemolysis  - Please obtain nutritional panel-ordered for morning labs - Will also order LDH and flow cytometry to rule out leukemia - Please obtain ultrasound abdomen to rule out hepatosplenomegaly - If this is due to acute illness, it is expected to improve once the patient is off antibiotics and her symptoms improved. - Transfuse for hemoglobin less than 7 - Transfuse for platelets less than 10 or platelets less than 30 with bleeding -Continue to hold hydroxyurea   Hypercalcemia Corrected calcium of 13 Likely secondary to dehydration  -Can consider IV hydration if patient tolerates - Management per primary team.  Hematology will continue to follow. Thank you for involving us  in this patient's care.  Please to reach out with any questions or concerns.  Mickiel Dry, MD Hematology/Oncology [facility] at [facility]

## 2024-12-03 NOTE — TOC Initial Note (Signed)
 Transition of Care [facility]) - Initial/Assessment Note    Patient Details  Name: [name] MRN: [id number] Date of Birth: 06/07/1936  [facility] CM/SW Contact:    Hoy DELENA Bigness, LCSW Phone Number: 12/03/2024, 11:07 AM  Clinical Narrative:                 CSW met with pt and spoke with daughter, Sharlet via t/c to complete assessment. Pt is high risk for readmission. Pt has been at [facility] for STR since 11/19/24. Pt is not on home O2 at baseline but, was discharged to [facility] on O2 on 12/5. Pt's daughter would like pt to return to [facility] for rehab at discharge. She shares that pt also was recently approved for LTC medicaid and may explore LTC options if pt's insurance does not cover STR. ICM will continue to follow.   Expected Discharge Plan: Skilled Nursing Facility Barriers to Discharge: Continued Medical Work up   Patient Goals and CMS Choice Patient states their goals for this hospitalization and ongoing recovery are:: For pt to return to SNF CMS Medicare.gov Compare Post Acute Care list provided to:: Patient Represenative (must comment) Choice offered to / list presented to : Adult Children [place] ownership interest in [facility].provided to:: Adult Children    Expected Discharge Plan and Services In-house Referral: Clinical Social Work Discharge Planning Services: NA Post Acute Care Choice: Skilled Nursing Facility Living arrangements for the past 2 months: Single Family Home, Skilled Nursing Facility Expected Discharge Date: 12/06/24               DME Arranged: N/A DME Agency: NA                  Prior Living Arrangements/Services Living arrangements for the past 2 months: Single Family Home, Skilled Nursing Facility Lives with:: Self Patient language and need for interpreter reviewed:: Yes Do you feel safe going back to the place where you live?: Yes      Need for Family Participation in Patient Care: Yes (Comment) Care giver support system  in place?: Yes (comment) Current home services: DME Criminal Activity/Legal Involvement Pertinent to Current Situation/Hospitalization: No - Comment as needed  Activities of Daily Living   ADL Screening (condition at time of admission) Independently performs ADLs?: Yes (appropriate for developmental age) Does the patient have a NEW difficulty with bathing/dressing/toileting/self-feeding that is expected to last >3 days?: No Does the patient have a NEW difficulty with getting in/out of bed, walking, or climbing stairs that is expected to last >3 days?: No Does the patient have a NEW difficulty with communication that is expected to last >3 days?: No Is the patient deaf or have difficulty hearing?: No Does the patient have difficulty seeing, even when wearing glasses/contacts?: No Does the patient have difficulty concentrating, remembering, or making decisions?: No  Permission Sought/Granted Permission sought to share information with : Facility Medical Sales Representative, Family Supports Permission granted to share information with : Yes, Verbal Permission Granted  Share Information with NAME: Eanes,Pamela  Daughter, Emergency Contact  [phone]  Permission granted to share info w AGENCY: SNF        Emotional Assessment Appearance:: Appears stated age Attitude/Demeanor/Rapport: Engaged, Lethargic Affect (typically observed): Accepting Orientation: : Oriented to Self, Oriented to Place, Oriented to  Time, Oriented to Situation Alcohol / Substance Use: Not Applicable Psych Involvement: No (comment)  Admission diagnosis:  Hypokalemia [E87.6] Afib (HCC) [I48.91] Acute on chronic anemia [D64.9] Patient Active Problem List  Diagnosis Date Noted   Acute on chronic anemia 12/02/2024   Afib (HCC) 12/02/2024   Pressure injury of skin 12/02/2024   Bilateral lower extremity edema 12/01/2024   Acute UTI 11/23/2024   Chronic constipation 11/23/2024   Folic acid  deficiency 11/23/2024    Severe protein-calorie malnutrition 11/23/2024   Generalized weakness 11/16/2024   CAP (community acquired pneumonia) 11/16/2024   Acute respiratory failure with hypoxia (HCC) 11/16/2024   GERD (gastroesophageal reflux disease) 11/16/2024   Paroxysmal atrial fibrillation (HCC) 11/16/2024   Thrombocytopenia 11/16/2024   Anemia of chronic disease 11/16/2024   Dementia (HCC) 10/12/2024   Chronic non-seasonal allergic rhinitis 10/06/2024   Leukocytosis 10/06/2024   Acute postoperative anemia due to expected blood loss 10/06/2024   Atrial fibrillation with RVR (HCC) 10/06/2024   Atrial flutter (HCC) 10/02/2024   Closed displaced comminuted fracture of shaft of left femur (HCC) 09/28/2024   Vitamin D  deficiency 08/23/2024   Hypothyroidism 08/23/2024   HTN (hypertension) 08/23/2024   Generalized anxiety disorder with panic attacks 08/23/2024   H/O total hip arthroplasty, left 10/01/2018   Uterovaginal prolapse, complete 10/12/2013   Polycythemia vera in remission (HCC) 02/25/2013   PCP:  Mancil Pfeiffer, PA-C Pharmacy:   [facility] [place], [place] - [id number] [place] [id number] Phone: [phone] Fax: [phone]     Social Drivers of Health (SDOH) Social History: SDOH Screenings   Food Insecurity: No Food Insecurity (12/02/2024)  Housing: Low Risk (12/02/2024)  Transportation Needs: No Transportation Needs (12/02/2024)  Utilities: Not At Risk (12/02/2024)  Depression (PHQ2-9): Low Risk (08/23/2024)  Physical Activity: Insufficiently Active (08/23/2024)  Social Connections: Moderately Integrated (12/02/2024)  Recent Concern: Social Connections - Socially Isolated (11/16/2024)  Stress: Stress Concern Present (08/23/2024)  Tobacco Use: Low Risk (12/02/2024)   SDOH Interventions:     Readmission Risk Interventions    12/03/2024   11:05 AM 10/04/2024   10:52 AM 09/30/2024    1:52 PM  Readmission Risk Prevention Plan  Post  Dischage Appt  Complete   Medication Screening  Complete Complete  Transportation Screening Complete Complete Complete  PCP or Specialist Appt within 3-5 Days Complete    HRI or Home Care Consult Complete    Social Work Consult for Recovery Care Planning/Counseling Complete    Palliative Care Screening Not Applicable    Medication Review Oceanographer) Complete

## 2024-12-03 NOTE — Plan of Care (Signed)
 Patient remains in afib.  She has been very weak today and did not want to get out of bed.  Updated family .

## 2024-12-03 NOTE — Progress Notes (Incomplete)
 SABRA

## 2024-12-03 NOTE — Progress Notes (Signed)
 Patient converted to NSR on the monitor around 2255  12/02/24, with lots of supraventricular complexes, did 12 leads EKG to confirm, NSR on EKG too, NP Erminio [facility] made aware. HR still irregular ranging 50-70s, Amio drip continue and monitor.  This morning 0422 again went to Afib with RVR with HR ranging 110-140s , still on Amio drip, patient is resting with no exertion, other V/S WNL, NP Erminio made aware.

## 2024-12-03 NOTE — Consult Note (Signed)
 "  Cardiology Consultation   Patient ID: [name] MRN: [id number]; DOB: 11-Apr-1936  Admit date: 12/02/2024 Date of Consult: 12/03/2024  PCP:  Mancil Pfeiffer, PA-C   [facility] Providers Cardiologist: New to HeartCare  Patient Profile:  [name] is a 88 y.o. female with a hx of paroxysmal atrial fibrillation (history of post-operative atrial fibrillation by review of the chart but had recurrence during admission in 09/2024 while admitted for left femur fracture), polycythemia vera, anemia, HTN, GERD and hypothyroidism who is being seen 12/03/2024 for the evaluation of atrial fibrillation with RVR at the request of Dr. Pearlean.  History of Present Illness:  [name] was recently admitted to [facility] from 12/1 - 11/19/2024 for generalized weakness in the setting of failure to thrive and a UTI. From a cardiac perspective, it appears she was in sinus rhythm and rates were well-controlled and she was continued on Cardizem  CD 240 mg daily, Lopressor  25 mg twice daily and Eliquis  2.5 mg twice daily.  She presented back to [facility] ED on 12/02/2024 from the [facility] after having abnormal labs with hemoglobin at 6.6 and K+ of 2.6. Repeat labs upon arrival to the ED showed WBC 4.5, Hgb 7.2, platelets 33 K, Na+ 143, K+ 2.7 and creatinine 0.88. AST 34 and ALT 9. CXR with mild pulmonary edema and small bilateral pleural effusions. Also noted to have increased bibasilar airspace opacities. EKG showed rate-controlled atrial fibrillation, heart rate 92 with incomplete RBBB.  She received 1 unit pRBC's and hemoglobin was 8.7 on repeat check yesterday evening. Back down to 7.9 today and platelets at 28K. In regards to her atrial fibrillation, she was started on IV Amiodarone  with a bolus and has been continued on Lopressor  25 mg twice daily. Eliquis  has been held given her anemia and thrombocytopenia.  By review of telemetry, she was in atrial fibrillation with heart rate in the  80's to 90's and around 2300 on 12/02/2024, she converted back to normal sinus rhythm with heart rate in the 60's to 70's. Was found to have recurrent atrial fibrillation this morning around 0430 and rates have been in the low 100's to 110's.  In talking with the patient today, she denies any specific chest pain or palpitations. Says that she overall feels weak and wants to be repositioned in bed. Reports having a known history of atrial fibrillation and is aware that she was previously on Eliquis . We reviewed why this is currently held.  She is unaware of any specific melena or hematochezia. Stool card pending. She denies any specific orthopnea or PND. Unaware of any lower extremity edema but she is volume overloaded by examination today.  Past Medical History:  Diagnosis Date   Hypertension    Influenza 11/2012   Osteoporosis    Polycythemia    Polycythemia vera(238.4) 02/25/2013   Diagnosed August 2005 on Hydrea  500 mg 3 times a week with excellent control.    Prolapse, uterus, congenital    ring placement    Past Surgical History:  Procedure Laterality Date   APPENDECTOMY     COLONOSCOPY     FEMUR IM NAIL Left 09/30/2024   Procedure: INSERTION, INTRAMEDULLARY ROD, FEMUR, RETROGRADE;  Surgeon: Onesimo Oneil LABOR, MD;  Location: [facility];  Service: Orthopedics;  Laterality: Left;   INCISION AND DRAINAGE Left 01/21/2022   Procedure: INCISION AND DRAINAGE LEFT INDEX FINGER;  Surgeon: Murrell Drivers, MD;  Location: [facility];  Service: Orthopedics;  Laterality: Left;  OOPHORECTOMY     left   opperectomy     ORIF RADIAL FRACTURE Left 12/27/2022   Procedure: OPEN REDUCTION INTERNAL FIXATION (ORIF) RADIAL and ULNAR FRACTURE;  Surgeon: Margrette Taft BRAVO, MD;  Location: [facility];  Service: Orthopedics;  Laterality: Left;   REVISION TOTAL HIP ARTHROPLASTY Left      Home Medications:  Prior to Admission medications  Medication Sig Start Date End Date Taking? Authorizing Provider   acetaminophen  (TYLENOL ) 325 MG tablet Take 2 tablets (650 mg total) by mouth every 6 (six) hours as needed for mild pain (pain score 1-3) or fever (or Fever >/= 101). 10/05/24  Yes Shahmehdi, Seyed A, MD  apixaban  (ELIQUIS ) 2.5 MG TABS tablet Take 1 tablet (2.5 mg total) by mouth 2 (two) times daily. 10/20/24  Yes Landy Barnie RAMAN, NP  Balsam [place]-Castor Oil OINT Apply 1 application  topically 3 (three) times daily.   Yes [provider]  diltiazem  (CARDIZEM  CD) 240 MG 24 hr capsule Take 1 capsule (240 mg total) by mouth daily. 10/20/24 12/02/24 Yes Landy Barnie RAMAN, NP  fluticasone  (FLONASE ) 50 MCG/ACT nasal spray Place 2 sprays into both nostrils daily. 10/20/24  Yes Landy Barnie RAMAN, NP  folic acid  (FOLVITE ) 1 MG tablet Take 1 tablet (1 mg total) by mouth daily. 11/20/24  Yes Johnson, Clanford L, MD  furosemide  (LASIX ) 20 MG tablet Take 20 mg by mouth daily.   Yes [provider]  ipratropium-albuterol  (DUONEB) 0.5-2.5 (3) MG/3ML SOLN Take 3 mLs by nebulization every 6 (six) hours as needed (wheezing, shortness of breath). 11/19/24  Yes Johnson, Clanford L, MD  levothyroxine  (SYNTHROID ) 50 MCG tablet Take 1 tablet (50 mcg total) by mouth daily before breakfast. 10/20/24  Yes Landy Barnie RAMAN, NP  metoprolol  tartrate (LOPRESSOR ) 25 MG tablet Take 1 tablet (25 mg total) by mouth 2 (two) times daily. 10/20/24 01/18/25 Yes Landy Barnie RAMAN, NP  potassium chloride  (KLOR-CON ) 20 MEQ packet Take 20 mEq by mouth once.   Yes [provider]  Protein (PROSOURCE PO) Take by mouth 2 (two) times daily.   Yes [provider]  sennosides-docusate sodium  (SENOKOT-S) 8.6-50 MG tablet Take 1 tablet by mouth daily.   Yes [provider]  Vitamin D , Ergocalciferol , (DRISDOL ) 1.25 MG (50000 UNIT) CAPS capsule Take 1 capsule (50,000 Units total) by mouth every 30 (thirty) days. 11/19/24  Yes Johnson, Clanford L, MD  ferrous sulfate 325 (65 FE) MG EC tablet Take 325 mg by mouth 3  (three) times a week. Monday,Wednesday, and Friday    [provider]    Scheduled Meds:  Chlorhexidine  Gluconate Cloth  6 each Topical Q0600   ferrous sulfate  325 mg Oral Q breakfast   folic acid   1 mg Oral Daily   furosemide   40 mg Intravenous Daily   levothyroxine   50 mcg Oral Q0600   metoprolol  tartrate  25 mg Oral BID   pantoprazole   40 mg Oral Daily   polyethylene glycol  17 g Oral Daily   senna-docusate  1 tablet Oral Daily   sodium chloride  flush  3 mL Intravenous Q12H   sodium chloride  flush  3 mL Intravenous Q12H   Continuous Infusions:  sodium chloride      amiodarone  30 mg/hr (12/03/24 0343)   PRN Meds: sodium chloride , acetaminophen  **OR** acetaminophen , bisacodyl , ondansetron  **OR** ondansetron  (ZOFRAN ) IV, mouth rinse, polyethylene glycol, sodium chloride  flush, traZODone   Allergies:   Allergies[1]  Social History:   Social History   Socioeconomic History  Marital status: Divorced    Spouse name: Not on file   Number of children: 1   Years of education: Not on file   Highest education level: 11th grade  Occupational History   Not on file  Tobacco Use   Smoking status: Never   Smokeless tobacco: Never  Vaping Use   Vaping status: Never Used  Substance and Sexual Activity   Alcohol  use: No   Drug use: No   Sexual activity: Yes    Birth control/protection: Post-menopausal  Other Topics Concern   Not on file  Social History Narrative   Not on file    Family History:    Family History  Problem Relation Age of Onset   Cancer Brother        throat cancer   Cancer Brother        lung cancer   Cancer Brother        prostate cancer     ROS:  Please see the history of present illness.   All other ROS reviewed and negative.     Physical Exam/Data: Vitals:   12/03/24 0500 12/03/24 0600 12/03/24 0700 12/03/24 0738  BP: 125/80 128/76 139/88   Pulse: (!) 55 (!) 135 81   Resp: 16 (!) 21 (!) 25   Temp:    97.7 F (36.5 C)  TempSrc:     Axillary  SpO2: 98% 97% 98%   Weight:      Height:        Intake/Output Summary (Last 24 hours) at 12/03/2024 1049 Last data filed at 12/03/2024 0853 Gross per 24 hour  Intake 991.05 ml  Output 350 ml  Net 641.05 ml      12/02/2024    3:59 PM 12/02/2024    7:34 AM 11/29/2024    1:04 PM  Last 3 Weights  Weight (lbs) 115 lb 11.9 oz 120 lb 9.6 oz 120 lb 9.6 oz  Weight (kg) 52.5 kg 54.704 kg 54.704 kg     Body mass index is 21.87 kg/m.  General: Thin, elderly female appearing in no acute distress HEENT: normal Neck: no JVD Vascular: No carotid bruits; Distal pulses 2+ bilaterally Cardiac:  normal S1, S2; Irregularly irregular Lungs: Decreased breath sounds along bases bilaterally.  No wheezing. Abd: soft, nontender, no hepatomegaly  Ext: 2+ pitting edema up to knees bilaterally Musculoskeletal:  No deformities, BUE and BLE strength normal and equal Skin: warm and dry  Neuro:  CNs 2-12 intact, no focal abnormalities noted Psych:  Normal affect   EKG:  The EKG was personally reviewed and demonstrates: Rate-controlled atrial fibrillation, heart rate 92 with incomplete RBBB.  Relevant CV Studies:  Echocardiogram: 09/2024 IMPRESSIONS     1. Left ventricular ejection fraction, by estimation, is 60 to 65%. The  left ventricle has normal function. The left ventricle has no regional  wall motion abnormalities. Left ventricular diastolic parameters are  indeterminate.   2. Right ventricular systolic function is normal. The right ventricular  size is normal. Tricuspid regurgitation signal is inadequate for assessing  PA pressure.   3. Left atrial size was mildly dilated.   4. The mitral valve is normal in structure. Trivial mitral valve  regurgitation. No evidence of mitral stenosis.   5. The tricuspid valve is abnormal.   6. The aortic valve is tricuspid. Aortic valve regurgitation is mild.   7. The inferior vena cava is dilated in size with >50% respiratory  variability,  suggesting right atrial pressure of 8 mmHg.  Comparison(s): No prior Echocardiogram.    Laboratory Data: High Sensitivity Troponin:  No results for input(s): TROPONINIHS in the last 720 hours. No results for input(s): TRNPT in the last 720 hours.    Chemistry Recent Labs  Lab 12/02/24 0748 12/02/24 1355 12/02/24 1909 12/03/24 0436  NA 143  --  140 141  K 2.7*  --  4.1 3.4*  CL 102  --  104 102  CO2 29  --  23 32  GLUCOSE 118*  --  135* 131*  BUN 28*  --  26* 29*  CREATININE 0.88  --  0.87 0.97  CALCIUM 11.8*  --  11.4* 11.7*  MG  --  1.9  --   --   GFRNONAA >60  --  >60 57*  ANIONGAP 11  --  13 6    Recent Labs  Lab 12/02/24 0748 12/02/24 1909  PROT 5.4*  --   ALBUMIN 2.4* 2.4*  AST 34  --   ALT 9  --   ALKPHOS 164*  --   BILITOT 0.8  --    Lipids No results for input(s): CHOL, TRIG, HDL, LABVLDL, LDLCALC, CHOLHDL in the last 168 hours.  Hematology Recent Labs  Lab 12/02/24 0426 12/02/24 0748 12/02/24 2016 12/03/24 0436  WBC 4.0 4.5  --  4.1  RBC 2.74* 2.92*  --  3.07*  HGB 6.6* 7.2* 8.7* 7.9*  HCT 21.7* 23.4* 27.4* 24.6*  MCV 79.2* 80.1  --  80.1  MCH 24.1* 24.7*  --  25.7*  MCHC 30.4 30.8  --  32.1  RDW 20.7* 20.8*  --  19.4*  PLT 31* 33*  --  28*   Thyroid   Recent Labs  Lab 12/02/24 1909  TSH 4.170    BNPNo results for input(s): BNP, PROBNP in the last 168 hours.  DDimer No results for input(s): DDIMER in the last 168 hours.  Radiology/Studies:  DG CHEST PORT 1 VIEW Result Date: 12/03/2024 EXAM: 1 VIEW(S) XRAY OF THE CHEST 12/03/2024 04:00:00 AM COMPARISON: 11/12/2024 CLINICAL HISTORY: Dyspnea and respiratory abnormalities FINDINGS: LUNGS AND PLEURA: Mild pulmonary edema. Small bilateral pleural effusions. Increased bibasilar airspace opacities. Chronic coarsened interstitial markings. No pneumothorax. HEART AND MEDIASTINUM: Cardiomegaly. Atherosclerotic calcifications. BONES AND SOFT TISSUES: No acute osseous abnormality.  IMPRESSION: 1. Mild pulmonary edema and small bilateral pleural effusions. 2. Increased bibasilar airspace opacities and chronic coarsened interstitial markings. 3. Cardiomegaly. Electronically signed by: Waddell Calk MD 12/03/2024 07:49 AM EST RP Workstation: [id number]     Assessment and Plan:  1. Atrial Fibrillation with RVR - She has a history of paroxysmal atrial fibrillation by review of the chart and it appears she was actually in atrial flutter during her admission in 09/2024. She was in atrial fibrillation upon arrival to the ED yesterday but did convert back to normal sinus rhythm yesterday evening but back in atrial fibrillation this morning. She is overall asymptomatic with her arrhythmia. - Suspect her current atrial fibrillation with RVR is driven by her acute illness. She is not a candidate for DCCV at this time given her anemia and thrombocytopenia as Eliquis  is currently held. She is currently on Lopressor  25 mg twice daily and can titrate to 37.5 mg twice daily if BP remains stable throughout the day. Would continue on IV Amiodarone  to assist with rate control for now and can likely switch to PO dosing after a 24-48 load. Keep K+ ~ 4.0, Mg ~ 2.0 and Hgb > 8.0.  2. Anemia/Thrombocytopenia/History of polycythemia vera -  Hemoglobin was as low as 6.6 when checked yesterday and is 7.9 today after receiving 1 unit pRBC's. Reviewed with the hospitalist team and they are planning to consult Hematology and anticipate another transfusion today. Would aim to keep hemoglobin above 8. Platelets at 28 K today but she denies any evidence of active bleeding. Stool card pending.  3. Volume Overload  - By review of notes from the [facility], she had started to develop lower extremity edema as of 11/25/2024. She appears volume overloaded by examination today and did receive a transfusion yesterday. CXR yesterday also showed mild pulmonary edema and small bilateral pleural effusions.  Would start IV  Lasix  40 mg daily and may need to titrate to twice daily dosing. Follow I's and O's along with daily weights.  4. Hypokalemia/Hypomagnesia - Most recent labs today show potassium of 3.4 magnesium 1.9. Potassium supplementation was already ordered for today. Keep K+ ~ 4.0 and Mg ~ 2.0.  5. Hypothyroidism - TSH at 4.170 this admission. Remains on Levothyroxine  50 mcg daily.  Risk Assessment/Risk Scores:  CHA2DS2-VASc Score = 5  This indicates a 7.2% annual risk of stroke. The patient's score is based upon: CHF History: 0 HTN History: 1 Diabetes History: 0 Stroke History: 0 Vascular Disease History: 1 Age Score: 2 Gender Score: 1   For questions or updates, please contact [facility] Please consult [email or web address] for contact info under    Signed, Laymon CHRISTELLA Qua, PA-C  12/03/2024 10:49 AM     [1]  Allergies Allergen Reactions   Codeine Nausea And Vomiting   Diovan [Valsartan] Itching and Rash   Lamisil [Terbinafine] Swelling   Penicillins Rash and Other (See Comments)    Breaking out in whelps    "

## 2024-12-03 NOTE — Progress Notes (Signed)
 " PROGRESS NOTE    [name]  FMW:[id number] DOB: 1936/06/02 DOA: 12/02/2024 PCP: Mancil Pfeiffer, PA-C   Brief Narrative:    [name] is a 88 y.o. female with medical history significant for h/o polycythemia vera (previously on Hydrea ), paroxysmal atrial fibrillation, hypertension, hypothyroidism, chronic anemia and thrombocytopenia as well as chronic hypoxic respiratory failure after recent hospitalization for pneumonia presents to the ED from [facility] rehab in Afib with RVR in the setting of low hemoglobin and low potassium she currently remains on IV amiodarone  drip per cardiology recommendations and has required 1 unit PRBC transfusion.  She continues to have persistent anemia as well as worsening thrombocytopenia requiring hematology evaluation.  Assessment & Plan:   Principal Problem:   Acute on chronic anemia Active Problems:   Polycythemia vera in remission (HCC)   Hypothyroidism   HTN (hypertension)   Atrial flutter (HCC)   Atrial fibrillation with RVR (HCC)   Acute respiratory failure with hypoxia (HCC)   GERD (gastroesophageal reflux disease)   Paroxysmal atrial fibrillation (HCC)   Thrombocytopenia   Severe protein-calorie malnutrition   Afib (HCC)   Pressure injury of skin  Assessment and Plan:   1) A-fib with RVR---EKG is A-fib with RVR with incomplete right bundle branch block In ED tachycardia persisted despite IV Cardizem , Dr. Debera recommended IV amiodarone  instead -Continue Lopressor  25 mg twice daily -Holding Eliquis  due to progressive anemia and thrombocytopenia -IV Lasix  40 mg daily due to generalized volume overload   2)Acute on chronic symptomatic anemia--patient has history of polycythemia vera she used to be on Hydrea , has been anemic for a while now so she taken off Hydrea  -Hemoglobin 7.2 up from 6.6,  - No obvious bleeding concerns - Hold Eliquis  as above - Check stool occult blood, MiraLAX  provided to assist with bowel  movements -Give iron and folic acid  supplementation - Status post 1 unit PRBC transfusion and may require another -Hematology plans to get ultrasound abdomen and nutritional panel   3)Hypokalemia---potassium is 2.7, magnesium is 1.9,  no vomiting or diarrhea Patient is on Lasix  -Replace potassium - Keep potassium close to 4 and magnesium close to 2   4)Chronic Thrombocytopenia--downtrending -Continue to monitor-appreciate hematology evaluation and recommendations -Plan is for ultrasound abdomen   5) chronic hypoxic respiratory failure--- oxygen requirement same at 2 L via nasal cannula - Patient has been on continuous O2 for the last couple of weeks after treatment for pneumonia   6) hypothyroidism -TSH 4.17 - Continue levothyroxine    7)GERD--continue Protonix   8) mild hypokalemia -Replete and reevaluate in a.m.     DVT prophylaxis:SCDs Code Status: Full Family Communication: None at bedside Disposition Plan:  Status is: Inpatient Remains inpatient appropriate because: Need for IV medications.  Consultants:  Cardiology Hematology  Procedures:  None  Antimicrobials:  None  Subjective: Patient seen and evaluated today with no new acute complaints or concerns. No acute concerns or events noted overnight.  She continues to remain in atrial fibrillation with RVR.  Objective: Vitals:   12/03/24 0600 12/03/24 0700 12/03/24 0738 12/03/24 1117  BP: 128/76 139/88    Pulse: (!) 135 81    Resp: (!) 21 (!) 25    Temp:   97.7 F (36.5 C) 97.9 F (36.6 C)  TempSrc:   Axillary Axillary  SpO2: 97% 98%    Weight:      Height:        Intake/Output Summary (Last 24 hours) at 12/03/2024 1215 Last data filed at 12/03/2024  9146 Gross per 24 hour  Intake 991.05 ml  Output 350 ml  Net 641.05 ml   Filed Weights   12/02/24 0734 12/02/24 1559  Weight: 54.7 kg 52.5 kg    Examination:  General exam: Appears calm and comfortable  Respiratory system: Clear to  auscultation. Respiratory effort normal.  Nasal cannula oxygen. Cardiovascular system: S1 & S2 heard, irregular and tachycardic. Gastrointestinal system: Abdomen is soft Central nervous system: Alert and awake Extremities: No edema Skin: No significant lesions noted Psychiatry: Flat affect.    Data Reviewed: I have personally reviewed following labs and imaging studies  CBC: Recent Labs  Lab 11/29/24 0750 12/02/24 0426 12/02/24 0748 12/02/24 2016 12/03/24 0436  WBC 5.7 4.0 4.5  --  4.1  NEUTROABS  --   --  3.1  --   --   HGB 7.5* 6.6* 7.2* 8.7* 7.9*  HCT 23.9* 21.7* 23.4* 27.4* 24.6*  MCV 79.4* 79.2* 80.1  --  80.1  PLT 44* 31* 33*  --  28*   Basic Metabolic Panel: Recent Labs  Lab 12/02/24 0426 12/02/24 0748 12/02/24 1355 12/02/24 1909 12/03/24 0436  NA 142 143  --  140 141  K 2.6* 2.7*  --  4.1 3.4*  CL 102 102  --  104 102  CO2 36* 29  --  23 32  GLUCOSE 110* 118*  --  135* 131*  BUN 27* 28*  --  26* 29*  CREATININE 0.86 0.88  --  0.87 0.97  CALCIUM 11.3* 11.8*  --  11.4* 11.7*  MG  --   --  1.9  --   --   PHOS  --   --   --  4.2  --    GFR: Estimated Creatinine Clearance: 31.4 mL/min (by C-G formula based on SCr of 0.97 mg/dL). Liver Function Tests: Recent Labs  Lab 12/02/24 0748 12/02/24 1909  AST 34  --   ALT 9  --   ALKPHOS 164*  --   BILITOT 0.8  --   PROT 5.4*  --   ALBUMIN 2.4* 2.4*   Recent Labs  Lab 12/02/24 0748  LIPASE 29   No results for input(s): AMMONIA in the last 168 hours. Coagulation Profile: No results for input(s): INR, PROTIME in the last 168 hours. Cardiac Enzymes: No results for input(s): CKTOTAL, CKMB, CKMBINDEX, TROPONINI in the last 168 hours. BNP (last 3 results) No results for input(s): PROBNP in the last 8760 hours. HbA1C: No results for input(s): HGBA1C in the last 72 hours. CBG: No results for input(s): GLUCAP in the last 168 hours. Lipid Profile: No results for input(s): CHOL, HDL,  LDLCALC, TRIG, CHOLHDL, LDLDIRECT in the last 72 hours. Thyroid  Function Tests: Recent Labs    12/02/24 1909  TSH 4.170   Anemia Panel: No results for input(s): VITAMINB12, FOLATE, FERRITIN, TIBC, IRON, RETICCTPCT in the last 72 hours. Sepsis Labs: No results for input(s): PROCALCITON, LATICACIDVEN in the last 168 hours.  Recent Results (from the past 240 hours)  MRSA Next Gen by PCR, Nasal     Status: None   Collection Time: 12/02/24  3:31 PM   Specimen: Nasal Mucosa; Nasal Swab  Result Value Ref Range Status   MRSA by PCR Next Gen NOT DETECTED NOT DETECTED Final    Comment: (NOTE) The GeneXpert MRSA Assay (FDA approved for NASAL specimens only), is one component of a comprehensive MRSA colonization surveillance program. It is not intended to diagnose MRSA infection nor to guide or monitor  treatment for MRSA infections. Test performance is not FDA approved in patients less than 67 years old. Performed at [facility], [address] [place], [place] [address]          Radiology Studies: DG CHEST PORT 1 VIEW Result Date: 12/03/2024 EXAM: 1 VIEW(S) XRAY OF THE CHEST 12/03/2024 04:00:00 AM COMPARISON: 11/12/2024 CLINICAL HISTORY: Dyspnea and respiratory abnormalities FINDINGS: LUNGS AND PLEURA: Mild pulmonary edema. Small bilateral pleural effusions. Increased bibasilar airspace opacities. Chronic coarsened interstitial markings. No pneumothorax. HEART AND MEDIASTINUM: Cardiomegaly. Atherosclerotic calcifications. BONES AND SOFT TISSUES: No acute osseous abnormality. IMPRESSION: 1. Mild pulmonary edema and small bilateral pleural effusions. 2. Increased bibasilar airspace opacities and chronic coarsened interstitial markings. 3. Cardiomegaly. Electronically signed by: Waddell Calk MD 12/03/2024 07:49 AM EST RP Workstation: [id number]        Scheduled Meds:  Chlorhexidine  Gluconate Cloth  6 each Topical Q0600   ferrous sulfate   325 mg Oral Q breakfast    folic acid   1 mg Oral Daily   furosemide   40 mg Intravenous Daily   levothyroxine   50 mcg Oral Q0600   metoprolol  tartrate  25 mg Oral BID   pantoprazole   40 mg Oral Daily   polyethylene glycol  17 g Oral Daily   senna-docusate  1 tablet Oral Daily   sodium chloride  flush  3 mL Intravenous Q12H   sodium chloride  flush  3 mL Intravenous Q12H   Continuous Infusions:  sodium chloride      amiodarone  30 mg/hr (12/03/24 0343)     LOS: 1 day    Time spent: 55 minutes    Kellis Mcadam D Maree, DO [facility]  If 7PM-7AM, please contact night-coverage [email or web address] 12/03/2024, 12:15 PM   "

## 2024-12-03 NOTE — Plan of Care (Signed)
 Patient very weak today.  Vitals stable.  Poor apatite

## 2024-12-03 NOTE — Progress Notes (Signed)
 Critical lab Result  12/03/24 9370  Provider Notification  Provider Name/Title Erminio Cone, NP  Date Provider Notified 12/03/24  Time Provider Notified 0630  Method of Notification Page (secure chat)  Notification Reason Critical Result  Test performed and critical result Platelets 28K  Date Critical Result Received 12/03/24  Time Critical Result Received 0625  Provider response Other (Comment)

## 2024-12-04 DIAGNOSIS — D649 Anemia, unspecified: Secondary | ICD-10-CM | POA: Diagnosis not present

## 2024-12-04 LAB — CBC WITH DIFFERENTIAL/PLATELET
Abs Immature Granulocytes: 0.31 K/uL — ABNORMAL HIGH (ref 0.00–0.07)
Basophils Absolute: 0.1 K/uL (ref 0.0–0.1)
Basophils Relative: 2 %
Eosinophils Absolute: 0.2 K/uL (ref 0.0–0.5)
Eosinophils Relative: 3 %
HCT: 26.5 % — ABNORMAL LOW (ref 36.0–46.0)
Hemoglobin: 8.5 g/dL — ABNORMAL LOW (ref 12.0–15.0)
Immature Granulocytes: 6 %
Lymphocytes Relative: 15 %
Lymphs Abs: 0.7 K/uL (ref 0.7–4.0)
MCH: 25.7 pg — ABNORMAL LOW (ref 26.0–34.0)
MCHC: 32.1 g/dL (ref 30.0–36.0)
MCV: 80.1 fL (ref 80.0–100.0)
Monocytes Absolute: 0.4 K/uL (ref 0.1–1.0)
Monocytes Relative: 9 %
Neutro Abs: 3.1 K/uL (ref 1.7–7.7)
Neutrophils Relative %: 65 %
Platelets: 29 K/uL — CL (ref 150–400)
RBC: 3.31 MIL/uL — ABNORMAL LOW (ref 3.87–5.11)
RDW: 19.5 % — ABNORMAL HIGH (ref 11.5–15.5)
WBC: 4.8 K/uL (ref 4.0–10.5)
nRBC: 0.4 % — ABNORMAL HIGH (ref 0.0–0.2)

## 2024-12-04 LAB — BASIC METABOLIC PANEL WITH GFR
Anion gap: 12 (ref 5–15)
BUN: 32 mg/dL — ABNORMAL HIGH (ref 8–23)
CO2: 26 mmol/L (ref 22–32)
Calcium: 12.6 mg/dL — ABNORMAL HIGH (ref 8.9–10.3)
Chloride: 102 mmol/L (ref 98–111)
Creatinine, Ser: 1.08 mg/dL — ABNORMAL HIGH (ref 0.44–1.00)
GFR, Estimated: 50 mL/min — ABNORMAL LOW
Glucose, Bld: 131 mg/dL — ABNORMAL HIGH (ref 70–99)
Potassium: 3.8 mmol/L (ref 3.5–5.1)
Sodium: 140 mmol/L (ref 135–145)

## 2024-12-04 LAB — OCCULT BLOOD X 1 CARD TO LAB, STOOL
Fecal Occult Bld: NEGATIVE
Fecal Occult Bld: NEGATIVE

## 2024-12-04 LAB — IRON AND TIBC
Iron: 64 ug/dL (ref 28–170)
Saturation Ratios: 45 % — ABNORMAL HIGH (ref 10.4–31.8)
TIBC: 141 ug/dL — ABNORMAL LOW (ref 250–450)
UIBC: 77 ug/dL

## 2024-12-04 LAB — VITAMIN B12: Vitamin B-12: 1307 pg/mL — ABNORMAL HIGH (ref 180–914)

## 2024-12-04 LAB — FOLATE: Folate: 16.5 ng/mL

## 2024-12-04 LAB — LACTATE DEHYDROGENASE: LDH: 1768 U/L — ABNORMAL HIGH (ref 105–235)

## 2024-12-04 LAB — FERRITIN: Ferritin: 494 ng/mL — ABNORMAL HIGH (ref 11–307)

## 2024-12-04 LAB — ALBUMIN: Albumin: 2.2 g/dL — ABNORMAL LOW (ref 3.5–5.0)

## 2024-12-04 LAB — URIC ACID: Uric Acid, Serum: 9.1 mg/dL — ABNORMAL HIGH (ref 2.5–7.1)

## 2024-12-04 LAB — MAGNESIUM: Magnesium: 1.8 mg/dL (ref 1.7–2.4)

## 2024-12-04 MED ORDER — PANTOPRAZOLE SODIUM 40 MG IV SOLR
40.0000 mg | INTRAVENOUS | Status: DC
  Administered 2024-12-04 – 2024-12-07 (×4): 40 mg via INTRAVENOUS
  Filled 2024-12-04 (×4): qty 10

## 2024-12-04 MED ORDER — LACTATED RINGERS IV SOLN: INTRAVENOUS | Status: AC

## 2024-12-04 NOTE — Progress Notes (Signed)
 Patient had a large type 7 BM, had an order for stool occult blood, collected the sample and send it to lab, lab later called and mentioned the cards were expired, gotten rid of the expired cards, has unexpired cards on the clean supply now, will have to recollect the stool sample again. MD made aware.

## 2024-12-04 NOTE — Plan of Care (Signed)
 Pt remains very lethargic, AFIB on amio gtt. Transitioned to DNR status today. Problem: Education: Goal: Knowledge of General Education information will improve Description: Including pain rating scale, medication(s)/side effects and non-pharmacologic comfort measures Outcome: Progressing   Problem: Health Behavior/Discharge Planning: Goal: Ability to manage health-related needs will improve Outcome: Progressing   Problem: Clinical Measurements: Goal: Ability to maintain clinical measurements within normal limits will improve Outcome: Progressing Goal: Will remain free from infection Outcome: Progressing Goal: Diagnostic test results will improve Outcome: Progressing Goal: Respiratory complications will improve Outcome: Progressing Goal: Cardiovascular complication will be avoided Outcome: Progressing   Problem: Activity: Goal: Risk for activity intolerance will decrease Outcome: Progressing   Problem: Nutrition: Goal: Adequate nutrition will be maintained Outcome: Progressing   Problem: Coping: Goal: Level of anxiety will decrease Outcome: Progressing   Problem: Elimination: Goal: Will not experience complications related to bowel motility Outcome: Progressing Goal: Will not experience complications related to urinary retention Outcome: Progressing   Problem: Pain Managment: Goal: General experience of comfort will improve and/or be controlled Outcome: Progressing   Problem: Safety: Goal: Ability to remain free from injury will improve Outcome: Progressing   Problem: Skin Integrity: Goal: Risk for impaired skin integrity will decrease Outcome: Progressing

## 2024-12-04 NOTE — Progress Notes (Signed)
 " PROGRESS NOTE    [name]  FMW:[id number] DOB: Dec 29, 1935 DOA: 12/02/2024 PCP: Mancil Pfeiffer, PA-C   Brief Narrative:    [name] is a 88 y.o. female with medical history significant for h/o polycythemia vera (previously on Hydrea ), paroxysmal atrial fibrillation, hypertension, hypothyroidism, chronic anemia and thrombocytopenia as well as chronic hypoxic respiratory failure after recent hospitalization for pneumonia presents to the ED from [facility] rehab in Afib with RVR in the setting of low hemoglobin and low potassium she currently remains on IV amiodarone  drip per cardiology recommendations and has required 1 unit PRBC transfusion.  She continues to have persistent anemia as well as worsening thrombocytopenia requiring hematology evaluation.  Assessment & Plan:   Principal Problem:   Acute on chronic anemia Active Problems:   Polycythemia vera in remission (HCC)   Hypothyroidism   HTN (hypertension)   Atrial flutter (HCC)   Atrial fibrillation with RVR (HCC)   Acute respiratory failure with hypoxia (HCC)   GERD (gastroesophageal reflux disease)   Paroxysmal atrial fibrillation (HCC)   Thrombocytopenia   Severe protein-calorie malnutrition   Afib (HCC)   Pressure injury of skin  Assessment and Plan:   1) A-fib with RVR---EKG is A-fib with RVR with incomplete right bundle branch block In ED tachycardia persisted despite IV Cardizem , Dr. Debera recommended IV amiodarone  instead -Continue Lopressor  25 mg twice daily -Holding Eliquis  due to progressive anemia and thrombocytopenia -IV Lasix  40 mg daily due to generalized volume overload, hold for now due to poor oral intake   2)Acute on chronic symptomatic anemia--patient has history of polycythemia vera she used to be on Hydrea , has been anemic for a while now so she taken off Hydrea  -Hemoglobin currently stable - No obvious bleeding concerns and FOBT negative - Hold Eliquis  as above - Status post 1 unit  PRBC transfusion and may require another -Hematology plans to get ultrasound abdomen and nutritional panel   3) hypercalcemia -Check ionized calcium as well as albumin levels -Continue to monitor -Gentle IV fluid for now -Plan to hold Lasix    4)Chronic Thrombocytopenia--currently stable, possible sequestration -Continue to monitor-appreciate hematology evaluation and recommendations - Ultrasound abdomen with splenomegaly noted -Elevation in LDH noted   5) chronic hypoxic respiratory failure--- oxygen requirement same at 2 L via nasal cannula - Patient has been on continuous O2 for the last couple of weeks after treatment for pneumonia   6) hypothyroidism -TSH 4.17 - Continue levothyroxine    7)GERD--continue Protonix   8) dysphagia -SLP evaluation -Dysphagia 1 diet     DVT prophylaxis:SCDs Code Status: Full Family Communication: None at bedside Disposition Plan:  Status is: Inpatient Remains inpatient appropriate because: Need for IV medications.  Consultants:  Cardiology Hematology  Procedures:  None  Antimicrobials:  None  Subjective: Patient seen and evaluated today with difficulty swallowing is noted per nursing staff this morning.  She continues to remain in atrial fibrillation with RVR overnight.  Objective: Vitals:   12/04/24 0600 12/04/24 0700 12/04/24 0734 12/04/24 1155  BP: 130/74 139/88    Pulse: (!) 118 (!) 32    Resp: (!) 23 (!) 21    Temp:   98.3 F (36.8 C) 98.1 F (36.7 C)  TempSrc:   Axillary Axillary  SpO2: 100% 100%    Weight:      Height:        Intake/Output Summary (Last 24 hours) at 12/04/2024 1300 Last data filed at 12/04/2024 0907 Gross per 24 hour  Intake 494.57 ml  Output  600 ml  Net -105.43 ml   Filed Weights   12/02/24 0734 12/02/24 1559  Weight: 54.7 kg 52.5 kg    Examination:  General exam: Appears calm and comfortable  Respiratory system: Clear to auscultation. Respiratory effort normal.  Nasal cannula  oxygen. Cardiovascular system: S1 & S2 heard, irregular and tachycardic. Gastrointestinal system: Abdomen is soft Central nervous system: Alert and awake Extremities: No edema Skin: No significant lesions noted Psychiatry: Flat affect.    Data Reviewed: I have personally reviewed following labs and imaging studies  CBC: Recent Labs  Lab 11/29/24 0750 12/02/24 0426 12/02/24 0748 12/02/24 2016 12/03/24 0436 12/04/24 0557  WBC 5.7 4.0 4.5  --  4.1 4.8  NEUTROABS  --   --  3.1  --   --  3.1  HGB 7.5* 6.6* 7.2* 8.7* 7.9* 8.5*  HCT 23.9* 21.7* 23.4* 27.4* 24.6* 26.5*  MCV 79.4* 79.2* 80.1  --  80.1 80.1  PLT 44* 31* 33*  --  28* 29*   Basic Metabolic Panel: Recent Labs  Lab 12/02/24 0426 12/02/24 0748 12/02/24 1355 12/02/24 1909 12/03/24 0436 12/04/24 0337  NA 142 143  --  140 141 140  K 2.6* 2.7*  --  4.1 3.4* 3.8  CL 102 102  --  104 102 102  CO2 36* 29  --  23 32 26  GLUCOSE 110* 118*  --  135* 131* 131*  BUN 27* 28*  --  26* 29* 32*  CREATININE 0.86 0.88  --  0.87 0.97 1.08*  CALCIUM 11.3* 11.8*  --  11.4* 11.7* 12.6*  MG  --   --  1.9  --   --  1.8  PHOS  --   --   --  4.2  --   --    GFR: Estimated Creatinine Clearance: 28.2 mL/min (A) (by C-G formula based on SCr of 1.08 mg/dL (H)). Liver Function Tests: Recent Labs  Lab 12/02/24 0748 12/02/24 1909  AST 34  --   ALT 9  --   ALKPHOS 164*  --   BILITOT 0.8  --   PROT 5.4*  --   ALBUMIN 2.4* 2.4*   Recent Labs  Lab 12/02/24 0748  LIPASE 29   No results for input(s): AMMONIA in the last 168 hours. Coagulation Profile: No results for input(s): INR, PROTIME in the last 168 hours. Cardiac Enzymes: No results for input(s): CKTOTAL, CKMB, CKMBINDEX, TROPONINI in the last 168 hours. BNP (last 3 results) No results for input(s): PROBNP in the last 8760 hours. HbA1C: No results for input(s): HGBA1C in the last 72 hours. CBG: No results for input(s): GLUCAP in the last 168  hours. Lipid Profile: No results for input(s): CHOL, HDL, LDLCALC, TRIG, CHOLHDL, LDLDIRECT in the last 72 hours. Thyroid  Function Tests: Recent Labs    12/02/24 1909  TSH 4.170   Anemia Panel: Recent Labs    12/04/24 0337  VITAMINB12 1,307*  FOLATE 16.5  FERRITIN 494*  TIBC 141*  IRON 64   Sepsis Labs: No results for input(s): PROCALCITON, LATICACIDVEN in the last 168 hours.  Recent Results (from the past 240 hours)  MRSA Next Gen by PCR, Nasal     Status: None   Collection Time: 12/02/24  3:31 PM   Specimen: Nasal Mucosa; Nasal Swab  Result Value Ref Range Status   MRSA by PCR Next Gen NOT DETECTED NOT DETECTED Final    Comment: (NOTE) The GeneXpert MRSA Assay (FDA approved for NASAL specimens only), is  one component of a comprehensive MRSA colonization surveillance program. It is not intended to diagnose MRSA infection nor to guide or monitor treatment for MRSA infections. Test performance is not FDA approved in patients less than 36 years old. Performed at [facility], [address], [place], [place] [address]          Radiology Studies: US  Abdomen Complete Result Date: 12/03/2024 CLINICAL DATA:  Thrombocytopenia. EXAM: ABDOMEN ULTRASOUND COMPLETE COMPARISON:  CT 11/15/2024 FINDINGS: Gallbladder: Not seen on the current exam, possibly due to non NPO status. No sonographic Murphy sign noted by sonographer. Common bile duct: Diameter: 6 mm, normal for patient's age. Liver: Not discretely measured but does not appear enlarged. Mildly heterogeneous and increased in parenchymal echogenicity. No focal lesion. No capsular nodularity. Portal vein is patent on color Doppler imaging with normal direction of blood flow towards the liver. IVC: No abnormality visualized. Pancreas: Visualized portion unremarkable. Spleen: Enlarged measuring 13.3 x 13.1 x 6.9 cm, volume of 630 cc. No focal splenic abnormality. Right Kidney: Length: 10.1 cm. Fullness of the renal  pelvis corresponds to extrarenal pelvis configuration on CT. Normal echogenicity. No stone or focal abnormality. Left Kidney: Length: 10 cm. Kidney is poorly assessed on the current exam. No hydronephrosis. What is measured by the technologist in the left kidney is likely adjacent bowel, no lesion on recent CT. Normal echogenicity. No stone. Abdominal aorta: No aneurysm visualized.  Atherosclerosis. Other findings: Trace ascites.  Right pleural effusion. IMPRESSION: 1. Splenomegaly.  No evidence of hepatomegaly. 2. The gallbladder is not seen on the current exam, possibly due to non NPO status. Electronically Signed   By: Andrea Gasman M.D.   On: 12/03/2024 21:21   DG CHEST PORT 1 VIEW Result Date: 12/03/2024 EXAM: 1 VIEW(S) XRAY OF THE CHEST 12/03/2024 04:00:00 AM COMPARISON: 11/12/2024 CLINICAL HISTORY: Dyspnea and respiratory abnormalities FINDINGS: LUNGS AND PLEURA: Mild pulmonary edema. Small bilateral pleural effusions. Increased bibasilar airspace opacities. Chronic coarsened interstitial markings. No pneumothorax. HEART AND MEDIASTINUM: Cardiomegaly. Atherosclerotic calcifications. BONES AND SOFT TISSUES: No acute osseous abnormality. IMPRESSION: 1. Mild pulmonary edema and small bilateral pleural effusions. 2. Increased bibasilar airspace opacities and chronic coarsened interstitial markings. 3. Cardiomegaly. Electronically signed by: Waddell Calk MD 12/03/2024 07:49 AM EST RP Workstation: [id number]        Scheduled Meds:  Chlorhexidine  Gluconate Cloth  6 each Topical Q0600   ferrous sulfate   325 mg Oral Q breakfast   folic acid   1 mg Oral Daily   furosemide   40 mg Intravenous Daily   levothyroxine   50 mcg Oral Q0600   metoprolol  tartrate  25 mg Oral BID   pantoprazole  (PROTONIX ) IV  40 mg Intravenous Q24H   polyethylene glycol  17 g Oral Daily   senna-docusate  1 tablet Oral Daily   sodium chloride  flush  3 mL Intravenous Q12H   sodium chloride  flush  3 mL Intravenous Q12H    Continuous Infusions:  amiodarone  30 mg/hr (12/04/24 0115)     LOS: 2 days    Time spent: 55 minutes    Avant Printy D Maree, DO [facility]  If 7PM-7AM, please contact night-coverage [email or web address] 12/04/2024, 1:00 PM   "

## 2024-12-05 DIAGNOSIS — D649 Anemia, unspecified: Secondary | ICD-10-CM | POA: Diagnosis not present

## 2024-12-05 LAB — COMPREHENSIVE METABOLIC PANEL WITH GFR
ALT: 7 U/L (ref 0–44)
AST: 36 U/L (ref 15–41)
Albumin: 2.2 g/dL — ABNORMAL LOW (ref 3.5–5.0)
Alkaline Phosphatase: 171 U/L — ABNORMAL HIGH (ref 38–126)
Anion gap: 7 (ref 5–15)
BUN: 37 mg/dL — ABNORMAL HIGH (ref 8–23)
CO2: 33 mmol/L — ABNORMAL HIGH (ref 22–32)
Calcium: 13.7 mg/dL (ref 8.9–10.3)
Chloride: 103 mmol/L (ref 98–111)
Creatinine, Ser: 1.32 mg/dL — ABNORMAL HIGH (ref 0.44–1.00)
GFR, Estimated: 39 mL/min — ABNORMAL LOW
Glucose, Bld: 104 mg/dL — ABNORMAL HIGH (ref 70–99)
Potassium: 3.6 mmol/L (ref 3.5–5.1)
Sodium: 142 mmol/L (ref 135–145)
Total Bilirubin: 0.6 mg/dL (ref 0.0–1.2)
Total Protein: 5.1 g/dL — ABNORMAL LOW (ref 6.5–8.1)

## 2024-12-05 LAB — PHOSPHORUS: Phosphorus: 5.9 mg/dL — ABNORMAL HIGH (ref 2.5–4.6)

## 2024-12-05 LAB — CBC
HCT: 27.4 % — ABNORMAL LOW (ref 36.0–46.0)
Hemoglobin: 8.6 g/dL — ABNORMAL LOW (ref 12.0–15.0)
MCH: 25.2 pg — ABNORMAL LOW (ref 26.0–34.0)
MCHC: 31.4 g/dL (ref 30.0–36.0)
MCV: 80.4 fL (ref 80.0–100.0)
Platelets: 30 K/uL — ABNORMAL LOW (ref 150–400)
RBC: 3.41 MIL/uL — ABNORMAL LOW (ref 3.87–5.11)
RDW: 19.9 % — ABNORMAL HIGH (ref 11.5–15.5)
WBC: 4.3 K/uL (ref 4.0–10.5)
nRBC: 0.5 % — ABNORMAL HIGH (ref 0.0–0.2)

## 2024-12-05 LAB — MAGNESIUM: Magnesium: 2.3 mg/dL (ref 1.7–2.4)

## 2024-12-05 LAB — VITAMIN D 25 HYDROXY (VIT D DEFICIENCY, FRACTURES): Vit D, 25-Hydroxy: 14.2 ng/mL — ABNORMAL LOW (ref 30–100)

## 2024-12-05 MED ORDER — ZOLEDRONIC ACID 4 MG/100ML IV SOLN
4.0000 mg | Freq: Once | INTRAVENOUS | Status: AC
  Administered 2024-12-05: 4 mg via INTRAVENOUS
  Filled 2024-12-05: qty 100

## 2024-12-05 MED ORDER — LACTATED RINGERS IV SOLN: INTRAVENOUS | Status: AC

## 2024-12-05 MED ORDER — CALCITONIN (SALMON) 200 UNIT/ML IJ SOLN
4.0000 [IU]/kg | Freq: Two times a day (BID) | INTRAMUSCULAR | Status: DC
  Administered 2024-12-05 (×2): 210 [IU] via INTRAMUSCULAR
  Filled 2024-12-05 (×3): qty 1.05

## 2024-12-05 MED ORDER — FUROSEMIDE 10 MG/ML IJ SOLN: 40.0000 mg | Freq: Once | INTRAMUSCULAR | Status: DC

## 2024-12-05 NOTE — Progress Notes (Addendum)
 " PROGRESS NOTE    [name]  FMW:[id number] DOB: 09-16-1936 DOA: 12/02/2024 PCP: Mancil Pfeiffer, PA-C   Brief Narrative:    [name] is a 88 y.o. female with medical history significant for h/o polycythemia vera (previously on Hydrea ), paroxysmal atrial fibrillation, hypertension, hypothyroidism, chronic anemia and thrombocytopenia as well as chronic hypoxic respiratory failure after recent hospitalization for pneumonia presents to the ED from [facility] rehab in Afib with RVR in the setting of low hemoglobin and low potassium she currently remains on IV amiodarone  drip per cardiology recommendations and has required 1 unit PRBC transfusion.  She continues to have persistent anemia as well as worsening thrombocytopenia requiring hematology evaluation.  Assessment & Plan:   Principal Problem:   Acute on chronic anemia Active Problems:   Polycythemia vera in remission (HCC)   Hypothyroidism   HTN (hypertension)   Atrial flutter (HCC)   Atrial fibrillation with RVR (HCC)   Acute respiratory failure with hypoxia (HCC)   GERD (gastroesophageal reflux disease)   Paroxysmal atrial fibrillation (HCC)   Thrombocytopenia   Severe protein-calorie malnutrition   Afib (HCC)   Pressure injury of skin  Assessment and Plan:   1) A-fib with RVR---EKG is A-fib with RVR with incomplete right bundle branch block In ED tachycardia persisted despite IV Cardizem , Dr. Debera recommended IV amiodarone  instead -Continue Lopressor  25 mg twice daily -Holding Eliquis  due to progressive anemia and thrombocytopenia -IV Lasix  40 mg daily due to generalized volume overload, hold for now due to poor oral intake and on IV fluid for hypercalcemia   2)Acute on chronic symptomatic anemia--patient has history of polycythemia vera she used to be on Hydrea , has been anemic for a while now so she taken off Hydrea  -Hemoglobin currently stable - No obvious bleeding concerns and FOBT negative - Hold  Eliquis  as above - Status post 1 unit PRBC transfusion and may require another - Continue to follow CBC   3) hypercalcemia - Continue IV fluid -Calcitonin -Zoledronic  acid -Further evaluation of phosphorus, PTH, and vitamin D  levels ordered   4)Chronic Thrombocytopenia--currently stable, possible sequestration -Continue to monitor-appreciate hematology evaluation and recommendations - Ultrasound abdomen with splenomegaly noted -Elevation in LDH noted   5) chronic hypoxic respiratory failure--- oxygen requirement same at 2 L via nasal cannula - Patient has been on continuous O2 for the last couple of weeks after treatment for pneumonia   6) hypothyroidism -TSH 4.17 - Continue levothyroxine    7)GERD--continue Protonix   8) dysphagia -SLP evaluation recommending n.p.o. for now     DVT prophylaxis:SCDs Code Status: DNR Family Communication: Daughter at bedside 12/21 Disposition Plan:  Status is: Inpatient Remains inpatient appropriate because: Need for IV medications.  Consultants:  Cardiology Hematology Palliative care  Procedures:  None  Antimicrobials:  None  Subjective: Patient seen and evaluated today with difficulty swallowing is noted per nursing staff this morning.  She continues to remain in atrial fibrillation with RVR overnight.  Patient noted to be hypercalcemic this morning.  She also appears to have had a mild aspiration episode resulting in worsening hypoxemia.  Objective: Vitals:   12/05/24 0800 12/05/24 0900 12/05/24 0912 12/05/24 1152  BP: (!) 121/58 (!) 141/81    Pulse: (!) 123 (!) 141 (!) 124   Resp: 20 (!) 31 (!) 25   Temp:    97.6 F (36.4 C)  TempSrc:    Axillary  SpO2: 99% 92% (!) 88%   Weight:      Height:  Intake/Output Summary (Last 24 hours) at 12/05/2024 1204 Last data filed at 12/05/2024 0600 Gross per 24 hour  Intake 1623.13 ml  Output 900 ml  Net 723.13 ml   Filed Weights   12/02/24 0734 12/02/24 1559  Weight:  54.7 kg 52.5 kg    Examination:  General exam: Appears calm and comfortable  Respiratory system: Clear to auscultation. Respiratory effort normal.  Nasal cannula oxygen. Cardiovascular system: S1 & S2 heard, irregular and tachycardic. Gastrointestinal system: Abdomen is soft Central nervous system: Alert and awake Extremities: No edema Skin: No significant lesions noted Psychiatry: Flat affect.    Data Reviewed: I have personally reviewed following labs and imaging studies  CBC: Recent Labs  Lab 12/02/24 0426 12/02/24 0748 12/02/24 2016 12/03/24 0436 12/04/24 0557 12/05/24 0358  WBC 4.0 4.5  --  4.1 4.8 4.3  NEUTROABS  --  3.1  --   --  3.1  --   HGB 6.6* 7.2* 8.7* 7.9* 8.5* 8.6*  HCT 21.7* 23.4* 27.4* 24.6* 26.5* 27.4*  MCV 79.2* 80.1  --  80.1 80.1 80.4  PLT 31* 33*  --  28* 29* 30*   Basic Metabolic Panel: Recent Labs  Lab 12/02/24 0748 12/02/24 1355 12/02/24 1909 12/03/24 0436 12/04/24 0337 12/05/24 0358 12/05/24 0800  NA 143  --  140 141 140 142  --   K 2.7*  --  4.1 3.4* 3.8 3.6  --   CL 102  --  104 102 102 103  --   CO2 29  --  23 32 26 33*  --   GLUCOSE 118*  --  135* 131* 131* 104*  --   BUN 28*  --  26* 29* 32* 37*  --   CREATININE 0.88  --  0.87 0.97 1.08* 1.32*  --   CALCIUM 11.8*  --  11.4* 11.7* 12.6* 13.7*  --   MG  --  1.9  --   --  1.8 2.3  --   PHOS  --   --  4.2  --   --   --  5.9*   GFR: Estimated Creatinine Clearance: 23.1 mL/min (A) (by C-G formula based on SCr of 1.32 mg/dL (H)). Liver Function Tests: Recent Labs  Lab 12/02/24 0748 12/02/24 1909 12/04/24 1323 12/05/24 0358  AST 34  --   --  36  ALT 9  --   --  7  ALKPHOS 164*  --   --  171*  BILITOT 0.8  --   --  0.6  PROT 5.4*  --   --  5.1*  ALBUMIN 2.4* 2.4* 2.2* 2.2*   Recent Labs  Lab 12/02/24 0748  LIPASE 29   No results for input(s): AMMONIA in the last 168 hours. Coagulation Profile: No results for input(s): INR, PROTIME in the last 168 hours. Cardiac  Enzymes: No results for input(s): CKTOTAL, CKMB, CKMBINDEX, TROPONINI in the last 168 hours. BNP (last 3 results) No results for input(s): PROBNP in the last 8760 hours. HbA1C: No results for input(s): HGBA1C in the last 72 hours. CBG: No results for input(s): GLUCAP in the last 168 hours. Lipid Profile: No results for input(s): CHOL, HDL, LDLCALC, TRIG, CHOLHDL, LDLDIRECT in the last 72 hours. Thyroid  Function Tests: Recent Labs    12/02/24 1909  TSH 4.170   Anemia Panel: Recent Labs    12/04/24 0337  VITAMINB12 1,307*  FOLATE 16.5  FERRITIN 494*  TIBC 141*  IRON 64   Sepsis Labs: No results for  input(s): PROCALCITON, LATICACIDVEN in the last 168 hours.  Recent Results (from the past 240 hours)  MRSA Next Gen by PCR, Nasal     Status: None   Collection Time: 12/02/24  3:31 PM   Specimen: Nasal Mucosa; Nasal Swab  Result Value Ref Range Status   MRSA by PCR Next Gen NOT DETECTED NOT DETECTED Final    Comment: (NOTE) The GeneXpert MRSA Assay (FDA approved for NASAL specimens only), is one component of a comprehensive MRSA colonization surveillance program. It is not intended to diagnose MRSA infection nor to guide or monitor treatment for MRSA infections. Test performance is not FDA approved in patients less than 64 years old. Performed at [facility], [address], [place], [place] [address]          Radiology Studies: US  Abdomen Complete Result Date: 12/03/2024 CLINICAL DATA:  Thrombocytopenia. EXAM: ABDOMEN ULTRASOUND COMPLETE COMPARISON:  CT 11/15/2024 FINDINGS: Gallbladder: Not seen on the current exam, possibly due to non NPO status. No sonographic Murphy sign noted by sonographer. Common bile duct: Diameter: 6 mm, normal for patient's age. Liver: Not discretely measured but does not appear enlarged. Mildly heterogeneous and increased in parenchymal echogenicity. No focal lesion. No capsular nodularity. Portal vein is patent on  color Doppler imaging with normal direction of blood flow towards the liver. IVC: No abnormality visualized. Pancreas: Visualized portion unremarkable. Spleen: Enlarged measuring 13.3 x 13.1 x 6.9 cm, volume of 630 cc. No focal splenic abnormality. Right Kidney: Length: 10.1 cm. Fullness of the renal pelvis corresponds to extrarenal pelvis configuration on CT. Normal echogenicity. No stone or focal abnormality. Left Kidney: Length: 10 cm. Kidney is poorly assessed on the current exam. No hydronephrosis. What is measured by the technologist in the left kidney is likely adjacent bowel, no lesion on recent CT. Normal echogenicity. No stone. Abdominal aorta: No aneurysm visualized.  Atherosclerosis. Other findings: Trace ascites.  Right pleural effusion. IMPRESSION: 1. Splenomegaly.  No evidence of hepatomegaly. 2. The gallbladder is not seen on the current exam, possibly due to non NPO status. Electronically Signed   By: Andrea Gasman M.D.   On: 12/03/2024 21:21        Scheduled Meds:  calcitonin  4 Units/kg Intramuscular BID   Chlorhexidine  Gluconate Cloth  6 each Topical Q0600   ferrous sulfate   325 mg Oral Q breakfast   folic acid   1 mg Oral Daily   levothyroxine   50 mcg Oral Q0600   metoprolol  tartrate  25 mg Oral BID   pantoprazole  (PROTONIX ) IV  40 mg Intravenous Q24H   polyethylene glycol  17 g Oral Daily   senna-docusate  1 tablet Oral Daily   sodium chloride  flush  3 mL Intravenous Q12H   sodium chloride  flush  3 mL Intravenous Q12H   Continuous Infusions:  amiodarone  30 mg/hr (12/05/24 0038)   lactated ringers  100 mL/hr at 12/05/24 0821     LOS: 3 days    Time spent: 55 minutes    Raun Routh D Maree, DO [facility]  If 7PM-7AM, please contact night-coverage [email or web address] 12/05/2024, 12:04 PM   "

## 2024-12-05 NOTE — Plan of Care (Signed)
" °  Problem: Education: Goal: Knowledge of General Education information will improve Description: Including pain rating scale, medication(s)/side effects and non-pharmacologic comfort measures 12/05/2024 1903 by Jearline Schuyler SAILOR, RN Outcome: Progressing 12/05/2024 1903 by Jearline Schuyler SAILOR, RN Outcome: Progressing   Problem: Health Behavior/Discharge Planning: Goal: Ability to manage health-related needs will improve 12/05/2024 1903 by Jearline Schuyler SAILOR, RN Outcome: Progressing 12/05/2024 1903 by Jearline Schuyler SAILOR, RN Outcome: Progressing   Problem: Clinical Measurements: Goal: Ability to maintain clinical measurements within normal limits will improve 12/05/2024 1903 by Jearline Schuyler SAILOR, RN Outcome: Progressing 12/05/2024 1903 by Jearline Schuyler SAILOR, RN Outcome: Progressing Goal: Will remain free from infection 12/05/2024 1903 by Jearline Schuyler SAILOR, RN Outcome: Progressing 12/05/2024 1903 by Jearline Schuyler SAILOR, RN Outcome: Progressing Goal: Diagnostic test results will improve 12/05/2024 1903 by Jearline Schuyler SAILOR, RN Outcome: Progressing 12/05/2024 1903 by Jearline Schuyler SAILOR, RN Outcome: Progressing Goal: Respiratory complications will improve 12/05/2024 1903 by Jearline Schuyler SAILOR, RN Outcome: Progressing 12/05/2024 1903 by Jearline Schuyler SAILOR, RN Outcome: Progressing Goal: Cardiovascular complication will be avoided 12/05/2024 1903 by Jearline Schuyler SAILOR, RN Outcome: Progressing 12/05/2024 1903 by Jearline Schuyler SAILOR, RN Outcome: Progressing   Problem: Activity: Goal: Risk for activity intolerance will decrease 12/05/2024 1903 by Jearline Schuyler SAILOR, RN Outcome: Progressing 12/05/2024 1903 by Jearline Schuyler SAILOR, RN Outcome: Progressing   Problem: Nutrition: Goal: Adequate nutrition will be maintained 12/05/2024 1903 by Jearline Schuyler SAILOR, RN Outcome: Progressing 12/05/2024 1903 by Jearline Schuyler SAILOR, RN Outcome: Progressing   Problem: Coping: Goal: Level of anxiety will  decrease 12/05/2024 1903 by Jearline Schuyler SAILOR, RN Outcome: Progressing 12/05/2024 1903 by Jearline Schuyler SAILOR, RN Outcome: Progressing   Problem: Elimination: Goal: Will not experience complications related to bowel motility 12/05/2024 1903 by Jearline Schuyler SAILOR, RN Outcome: Progressing 12/05/2024 1903 by Jearline Schuyler SAILOR, RN Outcome: Progressing Goal: Will not experience complications related to urinary retention 12/05/2024 1903 by Jearline Schuyler SAILOR, RN Outcome: Progressing 12/05/2024 1903 by Jearline Schuyler SAILOR, RN Outcome: Progressing   Problem: Pain Managment: Goal: General experience of comfort will improve and/or be controlled 12/05/2024 1903 by Jearline Schuyler SAILOR, RN Outcome: Progressing 12/05/2024 1903 by Jearline Schuyler SAILOR, RN Outcome: Progressing   Problem: Safety: Goal: Ability to remain free from injury will improve 12/05/2024 1903 by Jearline Schuyler SAILOR, RN Outcome: Progressing 12/05/2024 1903 by Jearline Schuyler SAILOR, RN Outcome: Progressing   Problem: Skin Integrity: Goal: Risk for impaired skin integrity will decrease 12/05/2024 1903 by Jearline Schuyler SAILOR, RN Outcome: Progressing 12/05/2024 1903 by Jearline Schuyler SAILOR, RN Outcome: Progressing   "

## 2024-12-05 NOTE — Plan of Care (Signed)

## 2024-12-05 NOTE — Progress Notes (Signed)
 Pt given meds crushed in applesauce with small sips of water this am approx 15/20 min ago with minimal difficulty swallowing, required coaching and had one small episode of coughing.   At this time pt alarming low O2 sats 84-85%. Pt found resting in bed but gurgling nose noted in upper airway. Suctioned, sat up  and placed on 5L HFHC with O2 now 89%.

## 2024-12-05 NOTE — Evaluation (Signed)
 Clinical/Bedside Swallow Evaluation Patient Details  Name: [name] MRN: [id number] Date of Birth: 1936-07-04  Today's Date: 12/05/2024 Time: SLP Start Time (ACUTE ONLY): 1018 SLP Stop Time (ACUTE ONLY): 1039 SLP Time Calculation (min) (ACUTE ONLY): 21 min  Past Medical History:  Past Medical History:  Diagnosis Date   Hypertension    Influenza 11/2012   Osteoporosis    Polycythemia    Polycythemia vera(238.4) 02/25/2013   Diagnosed August 2005 on Hydrea  500 mg 3 times a week with excellent control.    Prolapse, uterus, congenital    ring placement   Past Surgical History:  Past Surgical History:  Procedure Laterality Date   APPENDECTOMY     COLONOSCOPY     FEMUR IM NAIL Left 09/30/2024   Procedure: INSERTION, INTRAMEDULLARY ROD, FEMUR, RETROGRADE;  Surgeon: Onesimo Oneil LABOR, MD;  Location: [facility];  Service: Orthopedics;  Laterality: Left;   INCISION AND DRAINAGE Left 01/21/2022   Procedure: INCISION AND DRAINAGE LEFT INDEX FINGER;  Surgeon: Murrell Drivers, MD;  Location: [facility];  Service: Orthopedics;  Laterality: Left;   OOPHORECTOMY     left   opperectomy     ORIF RADIAL FRACTURE Left 12/27/2022   Procedure: OPEN REDUCTION INTERNAL FIXATION (ORIF) RADIAL and ULNAR FRACTURE;  Surgeon: Margrette Taft BRAVO, MD;  Location: [facility];  Service: Orthopedics;  Laterality: Left;   REVISION TOTAL HIP ARTHROPLASTY Left    HPI:  [name] is a 88 y.o. female with medical history significant for h/o polycythemia vera (previously on Hydrea ), paroxysmal atrial fibrillation, hypertension, hypothyroidism, chronic anemia and thrombocytopenia as well as chronic hypoxic respiratory failure after recent hospitalization for pneumonia presents to the ED from [facility] rehab in [place] with RVR in the setting of low hemoglobin and low potassium she currently remains on IV amiodarone  drip per cardiology recommendations and has required 1 unit PRBC transfusion.  She continues to  have persistent anemia as well as worsening thrombocytopenia requiring hematology evaluation. BSE requested.    Assessment / Plan / Recommendation  Clinical Impression  Clinical swallow evaluation completed at bedside and Pt assessed with ice chips, thin  water, and puree after oral care and suctioning. Pt's mandible protrudes beyond maxilla and exhibits open mouth breathing. Thick secretions removed with oral suction and Pt able to follow simple commands (open mouth, cough, say eee, swallow) with cues. She presents with willingness to take PO (stated she is hungry and thirsty), reduced labial closure, oral holding, suspected delay in swallow trigger, delayed cough and min oral residuals. Pt benefited from verbal cues to close lips around the spoon/straw and take sip, swallow, cough. Pt with increased posterior oral cavity residuals with applesauce and benefited from suction. Recommend NPO except for ice chips and small sips of water after oral care to increase comfort and to encourage ongoing swallowing to combat muscle disuse atrophy. Recommend alternative means for medications, but can try critical medications crushed as able  in small amount of puree followed by liquid wash if necessary. Above to RN. SLP will follow next date. SLP Visit Diagnosis: Dysphagia, unspecified (R13.10)    Aspiration Risk  Mild aspiration risk;Risk for inadequate nutrition/hydration    Diet Recommendation           Other Recommendations Oral Care Recommendations: Oral care prior to ice chip/H20;Staff/trained caregiver to provide oral care Caregiver Recommendations: Have oral suction available     Swallow Evaluation Recommendations Recommendations: Ice chips PRN after oral care;Free water protocol after oral care;NPO  Liquid Administration via: Spoon;Straw (for SMALL sips water) Medication Administration: Via alternative means Supervision: Full supervision/cueing for swallowing strategies Swallowing strategies  :  Slow rate;Small bites/sips Postural changes: Stay upright 30-60 min after meals;Position pt fully upright for meals Oral care recommendations: Oral care QID (4x/day);Oral care before ice chips/water;Staff/trained caregiver to provide oral care;Use suctioning for oral care Caregiver Recommendations: Have oral suction available   Assistance Recommended at Discharge    Functional Status Assessment Patient has had a recent decline in their functional status and demonstrates the ability to make significant improvements in function in a reasonable and predictable amount of time.  Frequency and Duration min 2x/week  1 week       Prognosis Prognosis for improved oropharyngeal function: Fair      Swallow Study   General Date of Onset: 12/02/24 HPI: [name] is a 88 y.o. female with medical history significant for h/o polycythemia vera (previously on Hydrea ), paroxysmal atrial fibrillation, hypertension, hypothyroidism, chronic anemia and thrombocytopenia as well as chronic hypoxic respiratory failure after recent hospitalization for pneumonia presents to the ED from [facility] rehab in [place] with RVR in the setting of low hemoglobin and low potassium she currently remains on IV amiodarone  drip per cardiology recommendations and has required 1 unit PRBC transfusion.  She continues to have persistent anemia as well as worsening thrombocytopenia requiring hematology evaluation. BSE requested. Type of Study: Bedside Swallow Evaluation Previous Swallow Assessment: None on record Diet Prior to this Study: Dysphagia 1 (pureed);Thin liquids (Level 0) Temperature Spikes Noted: No Respiratory Status: Nasal cannula History of Recent Intubation: No Behavior/Cognition: Alert;Cooperative;Pleasant mood;Requires cueing Oral Cavity Assessment: Dried secretions Oral Care Completed by SLP: Yes Oral Cavity - Dentition: Poor condition;Missing dentition Vision: Impaired for self-feeding Self-Feeding Abilities:  Total assist Patient Positioning: Upright in bed Baseline Vocal Quality: Normal;Low vocal intensity Volitional Cough: Weak;Congested Volitional Swallow: Able to elicit    Oral/Motor/Sensory Function Overall Oral Motor/Sensory Function: Generalized oral weakness Facial Symmetry: Within Functional Limits Facial Strength: Within Functional Limits Facial Sensation: Within Functional Limits Lingual ROM: Within Functional Limits Lingual Symmetry: Within Functional Limits Lingual Strength: Reduced Lingual Sensation: Within Functional Limits Velum: Within Functional Limits Mandible: Other (Comment) (protruding mandible)   Ice Chips Ice chips: Impaired Presentation: Spoon Oral Phase Impairments: Reduced labial seal;Reduced lingual movement/coordination Pharyngeal Phase Impairments: Suspected delayed Swallow   Thin Liquid Thin Liquid: Impaired Presentation: Straw;Spoon Oral Phase Impairments: Reduced labial seal;Reduced lingual movement/coordination Oral Phase Functional Implications: Prolonged oral transit;Oral holding Pharyngeal  Phase Impairments: Suspected delayed Swallow;Decreased hyoid-laryngeal movement;Multiple swallows;Cough - Immediate;Cough - Delayed    Nectar Thick Nectar Thick Liquid: Not tested   Honey Thick Honey Thick Liquid: Not tested   Puree Puree: Impaired Presentation: Spoon Oral Phase Impairments: Reduced labial seal;Reduced lingual movement/coordination Oral Phase Functional Implications: Prolonged oral transit;Oral residue Pharyngeal Phase Impairments: Suspected delayed Swallow;Throat Clearing - Delayed   Solid     Solid: Not tested     Thank you,  Lamar Candy, CCC-SLP [phone]  Pavan Bring 12/05/2024,10:47 AM

## 2024-12-06 DIAGNOSIS — I4891 Unspecified atrial fibrillation: Secondary | ICD-10-CM

## 2024-12-06 DIAGNOSIS — R4589 Other symptoms and signs involving emotional state: Secondary | ICD-10-CM

## 2024-12-06 DIAGNOSIS — Z66 Do not resuscitate: Secondary | ICD-10-CM

## 2024-12-06 DIAGNOSIS — Z7189 Other specified counseling: Secondary | ICD-10-CM

## 2024-12-06 DIAGNOSIS — J9601 Acute respiratory failure with hypoxia: Secondary | ICD-10-CM

## 2024-12-06 DIAGNOSIS — Z515 Encounter for palliative care: Secondary | ICD-10-CM

## 2024-12-06 DIAGNOSIS — E43 Unspecified severe protein-calorie malnutrition: Secondary | ICD-10-CM

## 2024-12-06 DIAGNOSIS — D45 Polycythemia vera: Secondary | ICD-10-CM

## 2024-12-06 DIAGNOSIS — D649 Anemia, unspecified: Secondary | ICD-10-CM | POA: Diagnosis not present

## 2024-12-06 DIAGNOSIS — Z789 Other specified health status: Secondary | ICD-10-CM

## 2024-12-06 DIAGNOSIS — D696 Thrombocytopenia, unspecified: Secondary | ICD-10-CM

## 2024-12-06 LAB — COMPREHENSIVE METABOLIC PANEL WITH GFR
ALT: 7 U/L (ref 0–44)
AST: 40 U/L (ref 15–41)
Albumin: 2.4 g/dL — ABNORMAL LOW (ref 3.5–5.0)
Alkaline Phosphatase: 189 U/L — ABNORMAL HIGH (ref 38–126)
Anion gap: 9 (ref 5–15)
BUN: 40 mg/dL — ABNORMAL HIGH (ref 8–23)
CO2: 31 mmol/L (ref 22–32)
Calcium: 13.4 mg/dL (ref 8.9–10.3)
Chloride: 101 mmol/L (ref 98–111)
Creatinine, Ser: 1.36 mg/dL — ABNORMAL HIGH (ref 0.44–1.00)
GFR, Estimated: 38 mL/min — ABNORMAL LOW
Glucose, Bld: 114 mg/dL — ABNORMAL HIGH (ref 70–99)
Potassium: 3.6 mmol/L (ref 3.5–5.1)
Sodium: 142 mmol/L (ref 135–145)
Total Bilirubin: 0.8 mg/dL (ref 0.0–1.2)
Total Protein: 5.6 g/dL — ABNORMAL LOW (ref 6.5–8.1)

## 2024-12-06 LAB — GLUCOSE, CAPILLARY
Glucose-Capillary: 114 mg/dL — ABNORMAL HIGH (ref 70–99)
Glucose-Capillary: 115 mg/dL — ABNORMAL HIGH (ref 70–99)
Glucose-Capillary: 116 mg/dL — ABNORMAL HIGH (ref 70–99)
Glucose-Capillary: 119 mg/dL — ABNORMAL HIGH (ref 70–99)
Glucose-Capillary: 128 mg/dL — ABNORMAL HIGH (ref 70–99)

## 2024-12-06 LAB — CALCIUM, IONIZED: Calcium, Ionized, Serum: 7.4 mg/dL — ABNORMAL HIGH (ref 4.5–5.6)

## 2024-12-06 LAB — MAGNESIUM: Magnesium: 2 mg/dL (ref 1.7–2.4)

## 2024-12-06 MED ORDER — ACETAMINOPHEN 325 MG PO TABS: 650.0000 mg | ORAL_TABLET | Freq: Four times a day (QID) | ORAL | Status: DC | PRN

## 2024-12-06 MED ORDER — GLYCOPYRROLATE 0.2 MG/ML IJ SOLN
0.2000 mg | INTRAMUSCULAR | Status: DC | PRN
  Filled 2024-12-06: qty 1

## 2024-12-06 MED ORDER — POLYVINYL ALCOHOL 1.4 % OP SOLN: 1.0000 [drp] | Freq: Four times a day (QID) | OPHTHALMIC | Status: DC | PRN

## 2024-12-06 MED ORDER — ACETAMINOPHEN 650 MG RE SUPP: 650.0000 mg | Freq: Four times a day (QID) | RECTAL | Status: DC | PRN

## 2024-12-06 MED ORDER — LACTATED RINGERS IV SOLN: INTRAVENOUS | Status: DC

## 2024-12-06 MED ORDER — GLYCOPYRROLATE 0.2 MG/ML IJ SOLN
0.2000 mg | INTRAMUSCULAR | Status: DC | PRN
  Administered 2024-12-06: 0.2 mg via INTRAVENOUS

## 2024-12-06 MED ORDER — MORPHINE SULFATE (PF) 2 MG/ML IV SOLN
2.0000 mg | INTRAVENOUS | Status: DC | PRN
  Administered 2024-12-06 – 2024-12-07 (×6): 2 mg via INTRAVENOUS
  Filled 2024-12-06 (×6): qty 1

## 2024-12-06 MED ORDER — LORAZEPAM 2 MG/ML IJ SOLN
2.0000 mg | INTRAMUSCULAR | Status: DC | PRN
  Administered 2024-12-07: 2 mg via INTRAVENOUS
  Filled 2024-12-06: qty 1

## 2024-12-06 MED ORDER — SODIUM CHLORIDE 0.9 % IV SOLN: INTRAVENOUS | Status: AC

## 2024-12-06 MED ORDER — GLYCOPYRROLATE 1 MG PO TABS: 1.0000 mg | ORAL_TABLET | ORAL | Status: DC | PRN

## 2024-12-06 NOTE — Consult Note (Signed)
 "                                                  Palliative Care Consult Note                                  Date: 12/06/2024   Patient Name: [name]  DOB: Oct 01, 1936  MRN: [id number]  Age / Sex: 88 y.o., female  PCP: Grooms, Charmaine, PA-C Referring Physician: Maree Adron BIRCH, DO  Reason for Consultation: Establishing goals of care, Non pain symptom management, Pain control, Psychosocial/spiritual support, and Terminal Care  Past Medical History:  Diagnosis Date   Hypertension    Influenza 11/2012   Osteoporosis    Polycythemia    Polycythemia vera(238.4) 02/25/2013   Diagnosed August 2005 on Hydrea  500 mg 3 times a week with excellent control.    Prolapse, uterus, congenital    ring placement    Subjective:   This NP Camellia Kays reviewed medical records, received report from team, assessed the patient and then meet at the patient's bedside to discuss diagnosis, prognosis, GOC, EOL wishes disposition and options.  Before meeting with the patient/family, I spent time reviewing the chart notes including admission H&P: Nursing note from yesterday, SLP note from yesterday, hospitalist note from yesterday, hospice note from today. I also reviewed vital signs, nursing flowsheets, medication administrations record, labs, and imaging. Labs reviewed include CBC from today which shows persistent anemia though stable at hemoglobin 8.9 (low was 4 days ago with hemoglobin 6.6) as well as platelet count persistently low at 28 today in the setting of chronic anemia and thrombocytopenia.  I met with the patient at the bedside, her daughter was present.  Per daughter's request we excused ourselves to the conference room for discussion.   We meet to discuss diagnosis prognosis, GOC, EOL wishes, disposition and options. Concept of Palliative Care was introduced as specialized medical care for people and their families living with serious illness.  If focuses on providing relief from the symptoms  and stress of a serious illness.  The goal is to improve quality of life for both the patient and the family. Values and goals of care important to patient and family were attempted to be elicited.  Created space and opportunity for patient  and family to explore thoughts and feelings regarding current medical situation   Natural trajectory and current clinical status were discussed. Questions and concerns addressed. Patient  encouraged to call with questions or concerns.    Patient/Family Understanding of Illness: The patient's daughter shares that the patient suffered multiple fractures over the past years.  She broke her hip when she was 88 years old, couple years later she broke her arm while trying to replace event at the base of the home in between hedges.  In January of this year she broke her pelvis and then in October she broke her femur.  When she broke her femur she had driven herself to [employer], was driving and shopping for trash bags.  Previously she was very functional and independent.  We spent time talking through multiple injuries and illnesses, progressive decline especially since femur fracture.  The patient daughter shares that she knows her mother is approaching end-of-life at this time and only wishes for  her to be comfortable.  Life Review: The patient's daughter shares it has been her and her mother since her parents divorced when she was 71 months old.  She is 71 but would turn 67 on January 9, and laments the fact that she will not reach this milestone.  She states that her mother was great until she was 37 and had her first fall.  She is described as resilient and never complained of pain or struggles.  She has 1 daughter and 2 grandchildren.  She is described as a very strong and independent woman.  Patient Values: Independence, family  Baseline Status: Prior to fall in October she was functionally independent and still driving.  Today's Discussion: In addition to  discussions described above we have extensive discussion of various topics.  We talked to the complicated illness that is presenting currently in the setting of multiple acute illnesses and injuries.  The patient's daughter shares that she has declined since her fall and femur fracture.  She understands her mother is unlikely to survive her hospital stay.  We spent time talking about options to care when we are out of options to cure.  We discussed transition to comfort care. I explained comfort care as care where the patient would no longer receive aggressive medical interventions such as continuous vital signs, lab work, radiology testing, or medications not focused on comfort, peace, and dignity. This includes stopping antibiotics and weaning oxygen to room air, as these are generally not accepted as providing comfort but only prolonging the dying process artificially. All care would focus on how the patient is looking and feeling. This would include management of any symptoms that may cause discomfort, pain, shortness of breath/air hunger, increased work of breathing, cough, nausea, agitation/restlessness, anxiety, and/or secretions etc. Symptoms would be managed with medications and other non-pharmacological interventions such as spiritual support if requested, repositioning, music therapy, or therapeutic listening. Family verbalized understanding and agreement.  The patient's daughter shares that she wishes to transition to comfort care today.  We discussed an option for comfort care outside of the hospital which would be engagement with hospice. I described hospice as a service for patients who have a life expectancy of 6 months or less. The goal of hospice is the preservation of dignity and quality at the end phases of life. Under hospice care, the focus changes from curative to symptom relief. I explained the three setting where hospice services can be provided including the home, at a living facility  (such as LTC SNF, Assisted Living, etc), and a hospice facility. I explained that acceptance to hospice in any specific location is the final decision of the hospice medical director and bed availability, if applicable. They verbalized understanding.  The patient's daughter DOES NOT want to engage with hospice and prefers to remain in the hospital for end-of-life.  We spent time talking about daughter support system and she states she has a wonderful support system of family and friends.  She is here by herself because she chooses to be, if she made a phone call she would have multiple people here.  I encouraged her to continue to care for herself during this difficult time.  She states she is at peace with her mother passing, but does not want to see her suffer.  I shared that with medications and good care that we will not let her suffer.  I shared the palliative medicine would continue to follow whether her mother remains in the hospital for  ongoing comfort care and support.  I provided her contact information for any questions or concerns while her mother is in the hospital. I provided emotional and general support through therapeutic listening, empathy, sharing of stories, therapeutic touch, and other techniques. I answered all questions and addressed all concerns to the best of my ability.  Goals: Transition to comfort care, no suffering  Review of Systems  Unable to perform ROS   Objective:   Primary Diagnoses: Present on Admission:  Acute on chronic anemia  Thrombocytopenia  Severe protein-calorie malnutrition  Polycythemia vera in remission (HCC)  Paroxysmal atrial fibrillation (HCC)  Hypothyroidism  GERD (gastroesophageal reflux disease)  Atrial fibrillation with RVR (HCC)  Atrial flutter (HCC)  Acute respiratory failure with hypoxia (HCC)  Afib (HCC)  HTN (hypertension)   Vital Signs:  BP (!) 127/44 (BP Location: Right Arm)   Pulse 78   Temp 97.6 F (36.4 C) (Axillary)    Resp (!) 21   Ht 5' 1 (1.549 m)   Wt 52.5 kg   SpO2 100%   BMI 21.87 kg/m   Physical Exam Vitals and nursing note reviewed.  Constitutional:      General: She is sleeping. She is not in acute distress. HENT:     Head: Normocephalic and atraumatic.  Cardiovascular:     Rate and Rhythm: Normal rate.  Pulmonary:     Effort: Pulmonary effort is normal. No respiratory distress.  Abdominal:     General: Abdomen is flat. There is no distension.     Palpations: Abdomen is soft.  Skin:    General: Skin is warm and dry.     Palliative Assessment/Data: 10%   Assessment & Plan:   HPI/Patient Profile: 88 y.o. female  with past medical history of h/o polycythemia vera (previously on Hydrea ), paroxysmal atrial fibrillation, hypertension, hypothyroidism, chronic anemia and thrombocytopenia as well as chronic hypoxic respiratory failure after recent hospitalization for pneumonia presents to the ED from [facility] rehab in [place] with RVR in the setting of low hemoglobin and low potassium.  She was admitted on 12/02/2024 with A-fib with RVR, acute on chronic symptomatic anemia, hypercalcemia, chronic thrombocytopenia, chronic hypoxic respiratory failure, dysphagia, and others.   SUMMARY OF RECOMMENDATIONS   DNR-comfort Transition to comfort care See symptom management orders below Ongoing support of patient and family Palliative medicine will continue to follow  Symptom Management:  Tylenol  650 mg PR every 6 hours as needed mild pain (1-3) fever Artificial tears 1 drop OU 4 times daily as needed dry eyes Robinul  0.2 mg IV every 4 hours as needed excess secretions Ativan  2 to 4 mg IV every 4 hours as needed anxiety or seizure Morphine  2 to 4 mg IV every 30 minutes as needed severe pain, dyspnea Zofran  4 mg IV every 6 hours as needed nausea  Code Status: DNR - Comfort  Prognosis:  Hours - Days  Discharge Planning:  Anticipated Hospital Death   Discussed with: Patient's family,  medical team, nursing team    Thank you for allowing us  to participate in the care of Leonor GORMAN Delay PMT will continue to support holistically.  Billing based on MDM: High  Problems Addressed: One acute or chronic illness or injury that poses a threat to life or bodily function  Amount and/or Complexity of Data: Category 1:Review of prior external note(s) from each unique source, Review of the result(s) of each unique test, and Assessment requiring an independent historian(s) and Category 3:Discussion of management or test interpretation with  external physician/other qualified health care professional/appropriate source (not separately reported)  Risks: Parenteral controlled substances and Decision not to resuscitate or to de-escalate care because of poor prognosis  Detailed review of medical records (labs, imaging, vital signs), medically appropriate exam, discussed with treatment team, counseling and education to patient, family, & staff, documenting clinical information, medication management, coordination of care  Signed by: Camellia Kays, NP Palliative Medicine Team  Team Phone # [phone] (Nights/Weekends)  12/06/2024, 10:04 AM  "

## 2024-12-06 NOTE — Plan of Care (Signed)
"  ° °  Progress Note  Patient Name: [name] Date of Encounter: 12/06/2024  Primary Cardiologist: None  Telemetry reviewed. In NSR. Off Amio and AC. Has severe thrombocytopenia, severe hypercalcemia and poor p.o intake. Patient made comfort care per discussion with palliative care at bed side. Cardiology will SIGN OFF. Please call us  back if you have any new questions.  Signed, Diannah SHAUNNA Maywood, MD  12/06/2024, 10:55 AM    "

## 2024-12-06 NOTE — Progress Notes (Signed)
 " PROGRESS NOTE    [name]  FMW:[id number] DOB: 12-31-1935 DOA: 12/02/2024 PCP: Mancil Pfeiffer, PA-C   Brief Narrative:    [name] is a 88 y.o. female with medical history significant for h/o polycythemia vera (previously on Hydrea ), paroxysmal atrial fibrillation, hypertension, hypothyroidism, chronic anemia and thrombocytopenia as well as chronic hypoxic respiratory failure after recent hospitalization for pneumonia presents to the ED from [facility] rehab in Af[place] with RVR in the setting of low hemoglobin and low potassium she currently remains on IV amiodarone  drip per cardiology recommendations and has required 1 unit PRBC transfusion.  She continues to have persistent anemia as well as thrombocytopenia.  She had developed hypercalcemia as well on 12/21 and is not responding well to calcitonin and zoledronic  acid or IV fluid.  Her overall condition has deteriorated over the last 2-3 days and she has remained n.p.o. due to poor swallowing ability.  It appeared that her prognosis was grave and she had been transitioned to comfort care on 12/22.  Assessment & Plan:   Principal Problem:   Acute on chronic anemia Active Problems:   Polycythemia vera in remission (HCC)   Hypothyroidism   HTN (hypertension)   Atrial flutter (HCC)   Atrial fibrillation with RVR (HCC)   Acute respiratory failure with hypoxia (HCC)   GERD (gastroesophageal reflux disease)   Paroxysmal atrial fibrillation (HCC)   Thrombocytopenia   Severe protein-calorie malnutrition   Afib (HCC)   Pressure injury of skin  Assessment and Plan:   1) A-fib with RVR---EKG is A-fib with RVR with incomplete right bundle branch block In ED tachycardia persisted despite IV Cardizem , Dr. Debera recommended IV amiodarone  instead - IV amiodarone  discontinued   2)Acute on chronic symptomatic anemia--patient has history of polycythemia vera she used to be on Hydrea , has been anemic for a while now so she taken off  Hydrea    3) hypercalcemia   4)Chronic Thrombocytopenia--currently stable, possible sequestration   5) chronic hypoxic respiratory failure--- oxygen requirement same at 2 L via nasal cannula - Patient has been on continuous O2 for the last couple of weeks after treatment for pneumonia   6) hypothyroidism -TSH 4.17   7)GERD  8) dysphagia -SLP evaluation recommending n.p.o. for now  Patient transitioned to comfort measures 12/22     DVT prophylaxis:SCDs Code Status: DNR Family Communication: Daughter at bedside 12/22 Disposition Plan:  Status is: Inpatient Remains inpatient appropriate because: Need for IV medications.  Consultants:  Cardiology Hematology Palliative care  Procedures:  None  Antimicrobials:  None  Subjective: Patient seen and evaluated today and is essentially unresponsive.  Daughter is at bedside and after further discussion she is agreeable to comfort care.  Objective: Vitals:   12/06/24 0900 12/06/24 1000 12/06/24 1100 12/06/24 1130  BP: (!) 121/41 (!) 123/45 (!) 126/50   Pulse: 67 72 69   Resp: 15 16 16    Temp:    97.8 F (36.6 C)  TempSrc:    Axillary  SpO2: 100% 100% 100%   Weight:      Height:        Intake/Output Summary (Last 24 hours) at 12/06/2024 1231 Last data filed at 12/06/2024 0800 Gross per 24 hour  Intake 432.19 ml  Output 650 ml  Net -217.81 ml   Filed Weights   12/02/24 0734 12/02/24 1559  Weight: 54.7 kg 52.5 kg    Examination:  General exam: Appears calm and comfortable, unresponsive Respiratory system: Clear to auscultation. Respiratory effort normal.  Nasal cannula oxygen. Cardiovascular system: S1 & S2 heard, irregular and tachycardic. Gastrointestinal system: Abdomen is soft Central nervous system: Unresponsive Extremities: No edema Skin: No significant lesions noted Psychiatry: Flat affect.    Data Reviewed: I have personally reviewed following labs and imaging studies  CBC: Recent Labs  Lab  12/02/24 0748 12/02/24 2016 12/03/24 0436 12/04/24 0557 12/05/24 0358 12/06/24 0716  WBC 4.5  --  4.1 4.8 4.3 4.9  NEUTROABS 3.1  --   --  3.1  --   --   HGB 7.2* 8.7* 7.9* 8.5* 8.6* 8.9*  HCT 23.4* 27.4* 24.6* 26.5* 27.4* 28.4*  MCV 80.1  --  80.1 80.1 80.4 80.5  PLT 33*  --  28* 29* 30* 28*   Basic Metabolic Panel: Recent Labs  Lab 12/02/24 1355 12/02/24 1909 12/03/24 0436 12/04/24 0337 12/05/24 0358 12/05/24 0800 12/06/24 0406  NA  --  140 141 140 142  --  142  K  --  4.1 3.4* 3.8 3.6  --  3.6  CL  --  104 102 102 103  --  101  CO2  --  23 32 26 33*  --  31  GLUCOSE  --  135* 131* 131* 104*  --  114*  BUN  --  26* 29* 32* 37*  --  40*  CREATININE  --  0.87 0.97 1.08* 1.32*  --  1.36*  CALCIUM  --  11.4* 11.7* 12.6* 13.7*  --  13.4*  MG 1.9  --   --  1.8 2.3  --  2.0  PHOS  --  4.2  --   --   --  5.9*  --    GFR: Estimated Creatinine Clearance: 22.4 mL/min (A) (by C-G formula based on SCr of 1.36 mg/dL (H)). Liver Function Tests: Recent Labs  Lab 12/02/24 0748 12/02/24 1909 12/04/24 1323 12/05/24 0358 12/06/24 0406  AST 34  --   --  36 40  ALT 9  --   --  7 7  ALKPHOS 164*  --   --  171* 189*  BILITOT 0.8  --   --  0.6 0.8  PROT 5.4*  --   --  5.1* 5.6*  ALBUMIN 2.4* 2.4* 2.2* 2.2* 2.4*   Recent Labs  Lab 12/02/24 0748  LIPASE 29   No results for input(s): AMMONIA in the last 168 hours. Coagulation Profile: No results for input(s): INR, PROTIME in the last 168 hours. Cardiac Enzymes: No results for input(s): CKTOTAL, CKMB, CKMBINDEX, TROPONINI in the last 168 hours. BNP (last 3 results) No results for input(s): PROBNP in the last 8760 hours. HbA1C: No results for input(s): HGBA1C in the last 72 hours. CBG: Recent Labs  Lab 12/04/24 1804 12/05/24 1155 12/06/24 0004 12/06/24 0450 12/06/24 0738  GLUCAP 116* 128* 115* 119* 114*   Lipid Profile: No results for input(s): CHOL, HDL, LDLCALC, TRIG, CHOLHDL, LDLDIRECT  in the last 72 hours. Thyroid  Function Tests: No results for input(s): TSH, T4TOTAL, FREET4, T3FREE, THYROIDAB in the last 72 hours.  Anemia Panel: Recent Labs    12/04/24 0337  VITAMINB12 1,307*  FOLATE 16.5  FERRITIN 494*  TIBC 141*  IRON 64   Sepsis Labs: No results for input(s): PROCALCITON, LATICACIDVEN in the last 168 hours.  Recent Results (from the past 240 hours)  MRSA Next Gen by PCR, Nasal     Status: None   Collection Time: 12/02/24  3:31 PM   Specimen: Nasal Mucosa; Nasal Swab  Result Value  Ref Range Status   MRSA by PCR Next Gen NOT DETECTED NOT DETECTED Final    Comment: (NOTE) The GeneXpert MRSA Assay (FDA approved for NASAL specimens only), is one component of a comprehensive MRSA colonization surveillance program. It is not intended to diagnose MRSA infection nor to guide or monitor treatment for MRSA infections. Test performance is not FDA approved in patients less than 40 years old. Performed at [facility], [address], [place], [place] [address]          Radiology Studies: No results found.       Scheduled Meds:  pantoprazole  (PROTONIX ) IV  40 mg Intravenous Q24H   sodium chloride  flush  3 mL Intravenous Q12H   sodium chloride  flush  3 mL Intravenous Q12H   Continuous Infusions:  sodium chloride        LOS: 4 days    Time spent: 55 minutes    Zaheer Wageman JONETTA Fairly, DO [facility]  If 7PM-7AM, please contact night-coverage [email or web address] 12/06/2024, 12:31 PM   "

## 2024-12-06 NOTE — Progress Notes (Signed)
 SPIRITUAL CARE AND COUNSELING CONSULT NOTE   VISIT SUMMARY:  Made end of life visit and found patient minimally responsive with daughter at bedside. Provided opportunity for daughter to reflect today on her mothers' illness and life. Provided prayer with patient and daughter at bedside. Daughter appropriate in grief process and grateful for visit. Will continue to visit per plan of care.   SPIRITUAL ENCOUNTER                                                                                                                                                                      Type of Visit: Initial Care provided to:: Pt and family Conversation partners present during encounter: Nurse Referral source: IDT Rounds Reason for visit: End-of-life OnCall Visit: No   SPIRITUAL FRAMEWORK  Presenting Themes: Meaning/purpose/sources of inspiration, Impactful experiences and emotions, Courage hope and growth, Rituals and practive, Significant life change Community/Connection: Family Patient Stress Factors: None identified Family Stress Factors: Loss   GOALS       INTERVENTIONS   Spiritual Care Interventions Made: Established relationship of care and support, Compassionate presence, Reflective listening, Normalization of emotions, Narrative/life review, Meaning making, Bereavement/grief support, Prayer    INTERVENTION OUTCOMES   Outcomes: Connection to spiritual care, Awareness of support, Reduced anxiety, Reduced fear, Autonomy/agency, Awareness of health  SPIRITUAL CARE PLAN   Spiritual Care Issues Still Outstanding: Chaplain will continue to follow    If immediate needs arise, please consult Zelda Salmon AMION schedule for chaplain coverage   Tanda PORTA [facility], Chaplain  12/06/2024 3:34 PM

## 2024-12-07 ENCOUNTER — Other Ambulatory Visit: Payer: Self-pay

## 2024-12-07 DIAGNOSIS — D649 Anemia, unspecified: Secondary | ICD-10-CM | POA: Diagnosis not present

## 2024-12-07 LAB — PARATHYROID HORMONE, INTACT (NO CA): PTH: <6 pg/mL — ABNORMAL LOW (ref 15–65)

## 2024-12-07 MED ORDER — MORPHINE BOLUS VIA INFUSION
1.0000 mg | INTRAVENOUS | Status: DC | PRN
  Administered 2024-12-07 (×2): 2 mg via INTRAVENOUS

## 2024-12-07 MED ORDER — MORPHINE 100MG IN NS 100ML (1MG/ML) PREMIX INFUSION
1.0000 mg/h | INTRAVENOUS | Status: DC
  Administered 2024-12-07: 2 mg/h via INTRAVENOUS
  Administered 2024-12-07: 1 mg/h via INTRAVENOUS
  Filled 2024-12-07: qty 100

## 2024-12-08 ENCOUNTER — Inpatient Hospital Stay: Admitting: Physician Assistant

## 2024-12-08 LAB — CBC
HCT: 28.4 % — ABNORMAL LOW (ref 36.0–46.0)
Hemoglobin: 8.9 g/dL — ABNORMAL LOW (ref 12.0–15.0)
MCH: 25.2 pg — ABNORMAL LOW (ref 26.0–34.0)
MCHC: 31.3 g/dL (ref 30.0–36.0)
MCV: 80.5 fL (ref 80.0–100.0)
Platelets: 28 K/uL — CL (ref 150–400)
RBC: 3.53 MIL/uL — ABNORMAL LOW (ref 3.87–5.11)
RDW: 20 % — ABNORMAL HIGH (ref 11.5–15.5)
WBC: 4.9 K/uL (ref 4.0–10.5)
nRBC: 0 % (ref 0.0–0.2)

## 2024-12-16 NOTE — Progress Notes (Signed)
 " Daily Progress Note   Date: 2024-12-27   Patient Name: [name]  DOB: 02/19/1936  MRN: [id number]  Age / Sex: 89 y.o., female  Attending Physician: Maree Adron BIRCH, DO Primary Care Physician: Mancil Pfeiffer, [place] Admit Date: 12/02/2024 Length of Stay: 5 days  Reason for Follow-up: Establishing goals of care, Non pain symptom management, Pain control, Psychosocial/spiritual support, and Terminal Care  Past Medical History:  Diagnosis Date   Hypertension    Influenza 11/2012   Osteoporosis    Polycythemia    Polycythemia vera(238.4) 02/25/2013   Diagnosed August 2005 on Hydrea  500 mg 3 times a week with excellent control.    Prolapse, uterus, congenital    ring placement    Subjective:   Subjective: Chart Reviewed. Updates received. Patient Assessed. Created space and opportunity for patient  and family to explore thoughts and feelings regarding current medical situation.  Today's Discussion: Today before meeting with the patient/family, I reviewed the chart notes including cardiology note from yesterday, internal medicine note from yesterday, chaplain note from yesterday, nursing note from today. I also reviewed vital signs, nursing flowsheets, and medication administrations record. No labs due to comfort care status.  Vital signs today include Temp 99.4, HR 78, RR 23, BP 113/52, satting 95% on room air.  Comfort medications administered include 1 dose of Robinul  at 10:58 AM yesterday, none today.  No Ativan  given yesterday but 2 mg of Ativan  given at 5:01 AM today.  Multiple doses of morphine  given over the past 24 hours including 10:58 AM, 5:28 PM, 11:45 PM yesterday; 4:51 AM, 6:34 AM, 7:49 AM today.  Morphine  requirement is escalating averaging about 2 mg every 1-1/2 to 2-1/2 hours.  Because of increasing morphine  requirement I will start a morphine  drip to allow more consistent comfort medications and symptom management.  I will continue parenteral opioids due to need thus  far and anticipated need moving forward.  Today I saw the patient at the bedside, she is a bit tachypneic with respirate of 28.  Her daughter is sleeping at the bedside in the bedside chair and elected to not wake her.  The patient does not appear to be in obvious distress, no grimacing or groaning.  No mottling noted to the lower extremities.  No apneic pauses noted, although again she is tachypneic.  No increased work of breathing.  She did just receive a dose of morphine  about an hour and a half ago and, as per above, is requiring escalating doses.  Plan to start morphine  drip as per above.  Review of Systems  Unable to perform ROS   Objective:   Primary Diagnoses: Present on Admission:  Acute on chronic anemia  Thrombocytopenia  Severe protein-calorie malnutrition  Polycythemia vera in remission (HCC)  Paroxysmal atrial fibrillation (HCC)  Hypothyroidism  GERD (gastroesophageal reflux disease)  Atrial fibrillation with RVR (HCC)  Atrial flutter (HCC)  Acute respiratory failure with hypoxia (HCC)  Afib (HCC)  HTN (hypertension)   Vital Signs:  BP (!) 113/52 (BP Location: Right Arm)   Pulse 78   Temp 99.4 F (37.4 C) (Oral)   Resp (!) 23   Ht 5' 1 (1.549 m)   Wt 52.5 kg   SpO2 95%   BMI 21.87 kg/m   Physical Exam Vitals and nursing note reviewed.  Constitutional:      General: She is sleeping. She is not in acute distress. Pulmonary:     Effort: Pulmonary effort is normal. Tachypnea (RR 28/min) present.  Abdominal:     General: Abdomen is flat.  Skin:    General: Skin is warm and dry.     Comments: No mottling noted     Palliative Assessment/Data: 10%   Existing Vynca/ACP Documentation: None  Assessment & Plan:   HPI/Patient Profile:  89 y.o. female  with past medical history of h/o polycythemia vera (previously on Hydrea ), paroxysmal atrial fibrillation, hypertension, hypothyroidism, chronic anemia and thrombocytopenia as well as chronic hypoxic  respiratory failure after recent hospitalization for pneumonia presents to the ED from [facility] rehab in [place] with RVR in the setting of low hemoglobin and low potassium.  She was admitted on 12/02/2024 with A-fib with RVR, acute on chronic symptomatic anemia, hypercalcemia, chronic thrombocytopenia, chronic hypoxic respiratory failure, dysphagia, and others.   Palliative medicine was consulted for GOC conversations.  SUMMARY OF RECOMMENDATIONS   DNR-comfort Continue comfort care See symptom management orders below Ongoing support of patient and family Palliative medicine will continue to follow  Symptom Management:  Tylenol  650 mg PR every 6 hours as needed mild pain (1-3) fever Artificial tears 1 drop OU 4 times daily as needed dry eyes Robinul  0.2 mg IV every 4 hours as needed excess secretions Ativan  2 to 4 mg IV every 4 hours as needed anxiety or seizure STOPPED Morphine  2 to 4 mg IV every 30 minutes as needed severe pain, dyspnea ADDED Morphing drip 1-15 mg/hr IV, titrate per instructions ADDED Morphine  bolus via infusion 1-4 mg IV q 15 min prn Zofran  4 mg IV every 6 hours as needed nausea  Code Status: DNR - Comfort  Prognosis: Hours - Days  Discharge Planning: Anticipated Hospital Death  Discussed with: Patient, family, medical team, nursing team  Thank you for allowing us  to participate in the care of [name] Delay PMT will continue to support holistically.  Billing based on MDM: High  Problems Addressed: One acute or chronic illness or injury that poses a threat to life or bodily function  Risks: Parenteral controlled substances  Detailed review of medical records (labs, imaging, vital signs), medically appropriate exam, discussed with treatment team, counseling and education to patient, family, & staff, documenting clinical information, medication management, coordination of care  Camellia Kays, NP Palliative Medicine Team  Team Phone # [phone]  (Nights/Weekends)  08/14/2021, 8:17 AM  "

## 2024-12-16 NOTE — Plan of Care (Signed)
   Problem: Education: Goal: Knowledge of General Education information will improve Description Including pain rating scale, medication(s)/side effects and non-pharmacologic comfort measures Outcome: Progressing

## 2024-12-16 NOTE — Death Summary Note (Signed)
 "  DEATH SUMMARY   Patient Details  Name: [name] MRN: [id number] DOB: 1936/02/20 ERE:Hmnnfd, Charmaine, PA-C Admission/Discharge Information   Admit Date:  12/22/2024  Date of Death: Date of Death: 12/27/24  Time of Death: Time of Death: 1640  Length of Stay: 5   Principle Cause of death: Acute encephalopathy-multifactorial.  Hospital Diagnoses: Principal Problem:   Acute on chronic anemia Active Problems:   Polycythemia vera in remission (HCC)   Hypothyroidism   HTN (hypertension)   Atrial flutter (HCC)   Atrial fibrillation with RVR (HCC)   Acute respiratory failure with hypoxia (HCC)   GERD (gastroesophageal reflux disease)   Paroxysmal atrial fibrillation (HCC)   Thrombocytopenia   Severe protein-calorie malnutrition   Afib (HCC)   Pressure injury of skin   Hospital Course: [name] is a 89 y.o. female with medical history significant for h/o polycythemia vera (previously on Hydrea ), paroxysmal atrial fibrillation, hypertension, hypothyroidism, chronic anemia and thrombocytopenia as well as chronic hypoxic respiratory failure after recent hospitalization for pneumonia presented to the ED from [facility] in [place] with RVR in the setting of low hemoglobin and low potassium.  She was placed on IV amiodarone  drip per cardiology recommendations and required 1 unit PRBC transfusion on account of her anemia.  She developed persistent anemia and thrombocytopenia and was seen by hematology as well with recommendations to transfuse as needed and continue to monitor.  She developed worsening hypercalcemia and was being treated for this with fluids as well as calcitonin and zoledronic  acid, but she was recalcitrant to the treatment and was not a candidate for any form of hemodialysis.  She could not swallow and her overall condition as well as mentation was deteriorating.  She was failing to thrive and her prognosis was poor.  Palliative discussion was had with family who  is in agreement that patient should transition to comfort measures as she overall did not look well.  She was transition to comfort care on 12/22 and ultimately expired on 2024-12-27 at 1640.  Assessment and Plan:   Procedures: None  Consultations: Cardiology, hematology, palliative care  The results of significant diagnostics from this hospitalization (including imaging, microbiology, ancillary and laboratory) are listed below for reference.   Significant Diagnostic Studies: US  Abdomen Complete Result Date: 12/03/2024 CLINICAL DATA:  Thrombocytopenia. EXAM: ABDOMEN ULTRASOUND COMPLETE COMPARISON:  CT 11/15/2024 FINDINGS: Gallbladder: Not seen on the current exam, possibly due to non NPO status. No sonographic Murphy sign noted by sonographer. Common bile duct: Diameter: 6 mm, normal for patient's age. Liver: Not discretely measured but does not appear enlarged. Mildly heterogeneous and increased in parenchymal echogenicity. No focal lesion. No capsular nodularity. Portal vein is patent on color Doppler imaging with normal direction of blood flow towards the liver. IVC: No abnormality visualized. Pancreas: Visualized portion unremarkable. Spleen: Enlarged measuring 13.3 x 13.1 x 6.9 cm, volume of 630 cc. No focal splenic abnormality. Right Kidney: Length: 10.1 cm. Fullness of the renal pelvis corresponds to extrarenal pelvis configuration on CT. Normal echogenicity. No stone or focal abnormality. Left Kidney: Length: 10 cm. Kidney is poorly assessed on the current exam. No hydronephrosis. What is measured by the technologist in the left kidney is likely adjacent bowel, no lesion on recent CT. Normal echogenicity. No stone. Abdominal aorta: No aneurysm visualized.  Atherosclerosis. Other findings: Trace ascites.  Right pleural effusion. IMPRESSION: 1. Splenomegaly.  No evidence of hepatomegaly. 2. The gallbladder is not seen on the current exam, possibly due to  non NPO status. Electronically Signed   By:  Andrea Gasman M.D.   On: 12/03/2024 21:21   DG CHEST PORT 1 VIEW Result Date: 12/03/2024 EXAM: 1 VIEW(S) XRAY OF THE CHEST 12/03/2024 04:00:00 AM COMPARISON: 11/12/2024 CLINICAL HISTORY: Dyspnea and respiratory abnormalities FINDINGS: LUNGS AND PLEURA: Mild pulmonary edema. Small bilateral pleural effusions. Increased bibasilar airspace opacities. Chronic coarsened interstitial markings. No pneumothorax. HEART AND MEDIASTINUM: Cardiomegaly. Atherosclerotic calcifications. BONES AND SOFT TISSUES: No acute osseous abnormality. IMPRESSION: 1. Mild pulmonary edema and small bilateral pleural effusions. 2. Increased bibasilar airspace opacities and chronic coarsened interstitial markings. 3. Cardiomegaly. Electronically signed by: Waddell Calk MD 12/03/2024 07:49 AM EST RP Workstation: [id number]   CT L-SPINE NO CHARGE Result Date: 11/15/2024 CLINICAL DATA:  Back pain.  Provided history of trauma. EXAM: CT LUMBAR SPINE WITHOUT CONTRAST TECHNIQUE: Multidetector CT imaging of the lumbar spine was performed without intravenous contrast administration. Multiplanar CT image reconstructions were also generated. RADIATION DOSE REDUCTION: This exam was performed according to the departmental dose-optimization program which includes automated exposure control, adjustment of the mA and/or kV according to patient size and/or use of iterative reconstruction technique. COMPARISON:  Lumbar radiograph 11/12/2024 FINDINGS: Segmentation: 5 lumbar type vertebrae.  T12 ribs are diminutive. Alignment: Broad-based S shaped scoliotic curvature.  No listhesis. Vertebrae: Mild superior endplate compression fracture of L4 with 25% loss of height centrally. There is slight irregularity of the superior endplate of L2 without definite loss of height or fracture. No definite L1 compression fracture as questioned on radiograph. The posterior elements are intact. Probable hemangioma within lef right t aspect of L1 vertebra. Paraspinal and  other soft tissues: Fully assessed on concurrent abdominopelvic CT, reported separately. Disc levels: Multilevel degenerative disc disease and facet hypertrophy. No high-grade canal stenosis. IMPRESSION: 1. Mild superior endplate compression fracture of L4 with 25% loss of height centrally. This is unchanged from recent radiograph, age indeterminate. 2. Slight irregularity of the superior endplate of L2 without definite loss of height or fracture. 3. No definite L1 compression fracture as questioned on radiograph. 4. Multilevel degenerative disc disease and facet hypertrophy. Electronically Signed   By: Andrea Gasman M.D.   On: 11/15/2024 21:23   CT T-SPINE NO CHARGE Result Date: 11/15/2024 CLINICAL DATA:  Back pain.  Provided history of fal trauma l. EXAM: CT THORACIC SPINE WITHOUT CONTRAST TECHNIQUE: Multidetector CT images of the thoracic were obtained using the standard protocol without intravenous contrast. RADIATION DOSE REDUCTION: This exam was performed according to the departmental dose-optimization program which includes automated exposure control, adjustment of the mA and/or kV according to patient size and/or use of iterative reconstruction technique. COMPARISON:  None Available. FINDINGS: Alignment: Exaggerated thoracic kyphosis. Mild broad-based dextroscoliotic curvature. Vertebrae: T12 compression fracture with approximately 20% loss of height, chronic appearing. No evidence of acute fracture. Vertebral body hemangioma within T7. posterior elements are intact. Paraspinal and other soft tissues: Fully assessed on concurrent chest CT, reported separately. Disc levels: No spinal canal or neural foraminal stenosis. IMPRESSION: 1. No acute fracture of the thoracic spine. 2. Chronic appearing T12 compression fracture with approximately 20% loss of height. Electronically Signed   By: Andrea Gasman M.D.   On: 11/15/2024 21:19   CT CHEST ABDOMEN PELVIS W CONTRAST Result Date: 11/15/2024 CLINICAL  DATA:  Provided history: Polytrauma, blunt Patient reports low back pain. Patient reports pain since left femur surgery in October. EXAM: CT CHEST, ABDOMEN, AND PELVIS WITH CONTRAST TECHNIQUE: Multidetector CT imaging of the chest, abdomen and pelvis was performed  following the standard protocol during bolus administration of intravenous contrast. RADIATION DOSE REDUCTION: This exam was performed according to the departmental dose-optimization program which includes automated exposure control, adjustment of the mA and/or kV according to patient size and/or use of iterative reconstruction technique. CONTRAST:  80mL OMNIPAQUE  IOHEXOL  300 MG/ML  SOLN COMPARISON:  None Available. FINDINGS: CT CHEST FINDINGS Cardiovascular: Aortic atherosclerosis. No aneurysm or acute aortic findings. No central pulmonary embolus on this exam not tailored to pulmonary artery assessment. The heart is enlarged. There are coronary artery calcifications. Trace pericardial effusion. Mediastinum/Nodes: No enlarged mediastinal or hilar lymph nodes. Decompressed esophagus. Subcentimeter bilateral thyroid  nodules. Recommend thyroid  US  and biopsy (ref: J Am Coll Radiol. 2015 Feb;12(2): 143-50). Lungs/Pleura: Moderate right and small to moderate left pleural effusion. Associated compressive atelectasis. Smooth septal thickening typical of pulmonary edema. Areas of patchy ground-glass opacity within the anterior upper lobes. No debris in the trachea or central bronchi. Benign calcified granuloma in the right lower lobe. Musculoskeletal: No acute fracture of the ribs, sternum, included clavicles or shoulder girdles. Generalized body wall edema but no confluent hematoma. Thoracic spine assessed on concurrent thoracic spine reformats, reported separately. CT ABDOMEN PELVIS FINDINGS Hepatobiliary: No hepatic injury or perihepatic hematoma. Gallbladder is unremarkable. Pancreas: No ductal dilatation or inflammation. No evidence of injury. Spleen: No  splenic injury. The spleen is enlarged spanning 14.4 cm AP. No focal splenic abnormality. Adrenals/Urinary Tract: No adrenal hemorrhage or renal injury identified. Lobulated bilateral renal contours. No suspicious renal lesion. Portions of the urinary bladder obscured by streak artifact from left hip arthroplasty. Obvious bladder injury. Stomach/Bowel: Decompressed stomach. No obvious bowel injury or mesenteric hematoma. No bowel wall thickening. Left colonic diverticulosis. No diverticulitis. Vascular/Lymphatic: Aortic atherosclerosis. No aortic or vascular injury. Retroaortic left renal vein. No suspicious lymphadenopathy Reproductive: Pessary in place. Atrophic uterus potentially visualized. No adnexal mass. Other: Generalized edema of the subcutaneous tissues. Mild edema of the intra-abdominal fat. Presacral edema without hemorrhage. No free air. Musculoskeletal: Lumbar spine assessed on concurrent lumbar spine reformats, reported separately. Left hip arthroplasty is intact were visualized. The bones are subjectively under mineralized. Focal osteopenia versus lucent lesion within the left iliac bone, series 3, image 99. Remote healed fractures of right pubic rami. Remote right sacral fracture. IMPRESSION: 1. No evidence of acute traumatic injury to the chest, abdomen, or pelvis. 2. Moderate right and small to moderate left pleural effusions with associated compressive atelectasis. 3. Smooth septal thickening typical of pulmonary edema. Areas of patchy ground-glass opacity within the anterior upper lobes may be related to pulmonary edema or pneumonia. 4. Generalized body wall edema. 5. Splenomegaly. 6. Colonic diverticulosis without diverticulitis. 7. Focal osteopenia versus lucent lesion within the left iliac bone. Recommend correlation with any history of malignancy. This could be further assessed with MRI as clinically indicated. Aortic Atherosclerosis (ICD10-I70.0). Electronically Signed   By: Andrea Gasman M.D.   On: 11/15/2024 21:15   CT Head Wo Contrast Result Date: 11/15/2024 EXAM: CT HEAD WITHOUT 11/15/2024 08:24:59 PM TECHNIQUE: CT of the head was performed without the administration of intravenous contrast. Automated exposure control, iterative reconstruction, and/or weight based adjustment of the mA/kV was utilized to reduce the radiation dose to as low as reasonably achievable. COMPARISON: 01/11/2024 CLINICAL HISTORY: Head trauma, minor (Age >= 65y) FINDINGS: BRAIN AND VENTRICLES: No acute intracranial hemorrhage. No mass effect or midline shift. No extra-axial fluid collection. No evidence of acute infarct. No hydrocephalus. Patchy periventricular and subcortical white matter low-density changes compatible with chronic microvascular  ischemic change. Mild diffuse cerebral volume loss. Low lying cerebellar tonsils. Atherosclerotic calcifications in large vessels of skull base. ORBITS: No acute abnormality. SINUSES AND MASTOIDS: No acute abnormality. SOFT TISSUES AND SKULL: No acute skull fracture. No acute soft tissue abnormality. There are numerous new calvarial lucencies compared to 01/11/2024. IMPRESSION: 1. No acute intracranial abnormality 2. Numerous new calvarial lucencies compared to 01/11/24. This is a nonspecific finding that can be seen in multiple myeloma. Recommend correlation with laboratory values. Electronically signed by: Franky Stanford MD 11/15/2024 08:38 PM EST RP Workstation: [id number]   CT Cervical Spine Wo Contrast Result Date: 11/15/2024 EXAM: CT CERVICAL SPINE WITHOUT CONTRAST 11/15/2024 08:24:59 PM TECHNIQUE: CT of the cervical spine was performed without the administration of intravenous contrast. Multiplanar reformatted images are provided for review. Automated exposure control, iterative reconstruction, and/or weight based adjustment of the mA/kV was utilized to reduce the radiation dose to as low as reasonably achievable. COMPARISON: None available. CLINICAL HISTORY:  Neck trauma (Age >= 65y) FINDINGS: CERVICAL SPINE: BONES AND ALIGNMENT: No acute fracture or traumatic malalignment. DEGENERATIVE CHANGES: No significant degenerative changes. SOFT TISSUES: No prevertebral soft tissue swelling. LUNGS: Bilateral pleural effusions. IMPRESSION: 1. No acute abnormality of the cervical spine. 2. Bilateral pleural effusions. Electronically signed by: Franky Stanford MD 11/15/2024 08:31 PM EST RP Workstation: [id number]   DG Lumbar Spine Complete Result Date: 11/12/2024 CLINICAL DATA:  Status post fall EXAM: LUMBAR SPINE - COMPLETE 5 VIEW COMPARISON:  Lumbar spine radiograph dated 02/30/2012 FINDINGS: Age indeterminate superior endplate compression of L1, L2, and L4. Unchanged T11 compression fracture. Levoscoliosis of the lumbar spine. No spondylolisthesis. Intervertebral disc spaces are maintained. IMPRESSION: 1. Age indeterminate superior endplate compression of L1, L2, and L4. 2. Unchanged T11 compression fracture. 3. Lumbar levoscoliosis.  No spondylolisthesis. Electronically Signed   By: Limin  Xu M.D.   On: 11/12/2024 19:02   DG Chest 2 View Result Date: 11/12/2024 CLINICAL DATA:  Cough EXAM: CHEST - 2 VIEW COMPARISON:  Chest x-ray 10/04/2024 FINDINGS: There are small bilateral pleural effusions. There are atelectatic changes in the lung bases. The heart is mildly enlarged. There is central pulmonary vascular congestion. No pneumothorax or acute fracture identified. IMPRESSION: Cardiomegaly with central pulmonary vascular congestion and small bilateral pleural effusions. Electronically Signed   By: Greig Pique M.D.   On: 11/12/2024 19:00    Microbiology: Recent Results (from the past 240 hours)  MRSA Next Gen by PCR, Nasal     Status: None   Collection Time: 12/02/24  3:31 PM   Specimen: Nasal Mucosa; Nasal Swab  Result Value Ref Range Status   MRSA by PCR Next Gen NOT DETECTED NOT DETECTED Final    Comment: (NOTE) The GeneXpert MRSA Assay (FDA approved for NASAL  specimens only), is one component of a comprehensive MRSA colonization surveillance program. It is not intended to diagnose MRSA infection nor to guide or monitor treatment for MRSA infections. Test performance is not FDA approved in patients less than 23 years old. Performed at [facility], [address], [place], [place] [address]     Time spent: 35 minutes  Signed: Adron JONETTA Fairly, DO 2024/12/12   "

## 2024-12-16 NOTE — Plan of Care (Signed)
  Problem: Pain Managment: Goal: General experience of comfort will improve and/or be controlled Outcome: Progressing   Problem: Safety: Goal: Ability to remain free from injury will improve Outcome: Progressing   Problem: Skin Integrity: Goal: Risk for impaired skin integrity will decrease Outcome: Progressing

## 2024-12-16 DEATH — deceased

## 2025-01-05 ENCOUNTER — Ambulatory Visit: Admitting: Internal Medicine

## 2025-01-14 ENCOUNTER — Ambulatory Visit: Admitting: Internal Medicine

## 2025-02-22 ENCOUNTER — Ambulatory Visit: Admitting: Physician Assistant
